# Patient Record
Sex: Male | Born: 1955 | Race: White | Hispanic: No | Smoking: Former smoker
Health system: Southern US, Community
[De-identification: ages and names within clinical notes are randomized; demographics above are authoritative.]

## PROBLEM LIST (undated history)

## (undated) DIAGNOSIS — F419 Anxiety disorder, unspecified: Secondary | ICD-10-CM

## (undated) DIAGNOSIS — K449 Diaphragmatic hernia without obstruction or gangrene: Secondary | ICD-10-CM

## (undated) DIAGNOSIS — I25110 Atherosclerotic heart disease of native coronary artery with unstable angina pectoris: Secondary | ICD-10-CM

## (undated) DIAGNOSIS — I10 Essential (primary) hypertension: Secondary | ICD-10-CM

## (undated) DIAGNOSIS — S04019A Injury of optic nerve, unspecified eye, initial encounter: Secondary | ICD-10-CM

## (undated) DIAGNOSIS — E7800 Pure hypercholesterolemia, unspecified: Secondary | ICD-10-CM

## (undated) DIAGNOSIS — S0291XA Unspecified fracture of skull, initial encounter for closed fracture: Secondary | ICD-10-CM

## (undated) HISTORY — PX: TONSILLECTOMY: SUR1361

## (undated) HISTORY — DX: Essential (primary) hypertension: I10

## (undated) HISTORY — DX: Anxiety disorder, unspecified: F41.9

## (undated) HISTORY — PX: FOOT SURGERY: SHX648

## (undated) HISTORY — PX: OTHER SURGICAL HISTORY: SHX169

---

## 1996-09-24 HISTORY — PX: SKIN GRAFT: SHX250

## 2002-12-11 ENCOUNTER — Emergency Department (HOSPITAL_COMMUNITY): Admission: EM | Admit: 2002-12-11 | Discharge: 2002-12-11 | Payer: Self-pay | Admitting: Emergency Medicine

## 2006-09-26 ENCOUNTER — Ambulatory Visit (HOSPITAL_COMMUNITY): Payer: Self-pay | Admitting: Psychiatry

## 2006-10-24 ENCOUNTER — Ambulatory Visit (HOSPITAL_COMMUNITY): Payer: Self-pay | Admitting: Psychiatry

## 2006-11-28 ENCOUNTER — Ambulatory Visit (HOSPITAL_COMMUNITY): Payer: Self-pay | Admitting: Psychiatry

## 2009-06-22 ENCOUNTER — Ambulatory Visit (HOSPITAL_COMMUNITY): Admission: RE | Admit: 2009-06-22 | Discharge: 2009-06-22 | Payer: Self-pay | Admitting: Pulmonary Disease

## 2010-02-15 ENCOUNTER — Encounter: Payer: Self-pay | Admitting: Adult Health

## 2010-02-15 ENCOUNTER — Encounter (INDEPENDENT_AMBULATORY_CARE_PROVIDER_SITE_OTHER): Payer: Self-pay | Admitting: *Deleted

## 2010-02-15 ENCOUNTER — Ambulatory Visit: Payer: Self-pay | Admitting: Cardiology

## 2010-02-15 DIAGNOSIS — F411 Generalized anxiety disorder: Secondary | ICD-10-CM | POA: Insufficient documentation

## 2010-02-15 DIAGNOSIS — R079 Chest pain, unspecified: Secondary | ICD-10-CM | POA: Insufficient documentation

## 2010-02-15 DIAGNOSIS — I10 Essential (primary) hypertension: Secondary | ICD-10-CM | POA: Insufficient documentation

## 2010-02-28 ENCOUNTER — Encounter: Payer: Self-pay | Admitting: Adult Health

## 2010-02-28 ENCOUNTER — Ambulatory Visit: Payer: Self-pay | Admitting: Cardiology

## 2010-02-28 ENCOUNTER — Encounter (HOSPITAL_COMMUNITY): Admission: RE | Admit: 2010-02-28 | Discharge: 2010-03-30 | Payer: Self-pay | Admitting: Cardiology

## 2010-03-01 ENCOUNTER — Telehealth (INDEPENDENT_AMBULATORY_CARE_PROVIDER_SITE_OTHER): Payer: Self-pay | Admitting: *Deleted

## 2010-03-01 ENCOUNTER — Encounter: Payer: Self-pay | Admitting: Cardiology

## 2010-04-14 ENCOUNTER — Telehealth (INDEPENDENT_AMBULATORY_CARE_PROVIDER_SITE_OTHER): Payer: Self-pay | Admitting: *Deleted

## 2010-10-24 NOTE — Progress Notes (Signed)
Summary: PT WANTS TO TALK TO MANAGER ABOUT BILL  Phone Note Call from Patient Call back at Home Phone [phone]   Caller: pt Reason for Call: Talk to Doctor Summary of Call: Pt called in 04/13/10 very upset because he is being charged for a stress test and has to pay out of [place] over $500. Patient was first scheduled for a regular GXT and sent home with instructions for the procedure that says do not take your medication. Patient states that Joni Reining told him to go ahead and take his BP meds for that morning so he did.   When put on the treadmill Dr Diona Browner was supervising and could not get his BP up so Dr Diona Browner asked the patient if he had taken his medications he replyed yes because Joni Reining told him to. Dr Diona Browner as a result of this ordered a stress myoview that was set up and done on another day by Dr Dietrich Pates. Per patient Dr Diona Browner did discuse this with patient and he was willing to do this.   Now that patient has recieved the bill he feels that he should not be responsible for this payment since he was told by Samara Deist to take his BP meds. He feels that he would have never had to do a myoview if he had not taking this medication. He would like the doctors to call over to the billing department and have this taken care of. He states that he is willing to pay what he would have owed for the regular GXT (that he was not charged for).  I told him I would give this information to the manager. He replyed that would do no good he had already talk to him and was told there was nothing we could do to call the billing department. I interuped patient and asked him to please let me speak. I explained to patient that there is nothing that any of our staff members her in the office can do to handle the situation and it was doing no good to call up her upset and taking it out on [place]. I convinced him into letting me talk with the manager again to see what I could do.   Initial call  taken by: Faythe Ghee,  April 14, 2010 2:52 PM  Follow-up for Phone Call        Talked with Abundio about [place] instructions. Office Note indicates that Willam should not take BB before test. RN gave Leonette Most written instructions on not to take BB the day of test.  Charels says that Samara Deist instructed him to take the BB but not his other non-blood pressure.   Ranald is talking with [facility] billing to reduce the amount he owes due to a mistake on our provider's part. Follow-up by: Alexis Goodell

## 2010-10-24 NOTE — Assessment & Plan Note (Signed)
Summary: rov post stress echo   Visit Type:  Follow-up Referring Provider:  Diona Browner Primary Provider:  Juanetta Gosling   History of Present Illness: no cardiology complaints had myoveiw this morning follow-up from procedure  Current Medications (verified): 1)  Klor-Con M20 20 Meq Cr-Tabs (Potassium Chloride Crys Cr) .[name].. 1 Tablet By Mouth Once Daily 2)  Bupropion Hcl 300 Mg Xr24h-Tab (Bupropion Hcl) .[name].. 1 Tablet By Mouth Once Daily 3)  Atenolol-Chlorthalidone 100-25 Mg Tabs (Atenolol-Chlorthalidone) .[name].. 1 Tablet By Mouth Once Daily  Allergies (verified): No Known Drug Allergies  Vital Signs:  Patient profile:   55 year old male Weight:      261 pounds Pulse rate:   64 / minute BP sitting:   136 / 82  (right arm)  Vitals Entered By: Dreama Saa, CNA (February 28, 2010 3:22 PM)

## 2010-10-24 NOTE — Progress Notes (Signed)
Summary: Results  Phone Note Call from Patient   Caller: Patient Summary of Call: pt would like return phone call regarding test results/tg Initial call taken by: Raechel Ache [facility],  March 01, 2010 10:11 AM  Follow-up for Phone Call        I gave pt prelimary results that Dr. Dietrich Pates discussed in clinic yesterday.  Cx appt for Thurs and will discuss further with pt on Tuesday. Follow-up by: Teressa Lower RN,  March 01, 2010 11:42 AM

## 2010-10-24 NOTE — Assessment & Plan Note (Signed)
Summary: PER DR.HAWKINS FOR CHEST DISCOMFORT/TG   Visit Type:  New Referral Referring Provider:  Diona Browner Primary Provider:  Juanetta Gosling  CC:  chest pain.  History of Present Illness: [name] is a 56 y/o obese CM of referred by Dr. Juanetta Gosling secondary to chest discomfort.  He has not prior history of CAD or cardiac work-up with the exception of a stress test in 1998, which the patient states was normal.He has a history of hypertension, anxiety and depression.  He has been having chest discomfort for 25 yrs which is radiation pain from his back and shoulders.  Usually occurs with exertion, but often with anxiety, and anger.  He states it is located at the base of his throat.  It is not associated with SOB, Nausea or weakness.  He has a history of hypertension, anxiety and depression.  Preventive Screening-Counseling & Management  Alcohol-Tobacco     Alcohol drinks/day: 0     Smoking Status: never     Year Quit: 1990  Current Medications (verified): 1)  Ceftin 500 Mg Tabs (Cefuroxime Axetil) .... Take 1 Tablet By Mouth Two Times A Day 2)  Klor-Con M20 20 Meq Cr-Tabs (Potassium Chloride Crys Cr) .[name].. 1 Tablet By Mouth Once Daily 3)  Bupropion Hcl 300 Mg Xr24h-Tab (Bupropion Hcl) .[name].. 1 Tablet By Mouth Once Daily 4)  Atenolol-Chlorthalidone 100-25 Mg Tabs (Atenolol-Chlorthalidone) .[name].. 1 Tablet By Mouth Once Daily  Allergies (verified): No Known Drug Allergies  Past History:  Past Medical History: Hypertension Anxiety  Family History: Father: Family History of Hyperlipidemia, Hypertension   Social History: Full Time Single  Tobacco Use - No.  Alcohol Use - no Alcohol drinks/day:  0 Smoking Status:  never  Vital Signs:  Patient profile:   55 year old male Height:      72 inches Weight:      258 pounds BMI:     35.12 O2 Sat:      98 % Pulse rate:   51 / minute BP sitting:   138 / 80  (left arm)  Physical Exam  General:  Well developed, well nourished, in no acute  distress. Head:  normocephalic and atraumatic Eyes:  PERRLA/EOM intact; conjunctiva and lids normal. Ears:  TM's intact and clear with normal canals and hearing Nose:  no deformity, discharge, inflammation, or lesions Mouth:  Teeth, gums and palate normal. Oral mucosa normal. Neck:  Neck supple, no JVD. No masses, thyromegaly or abnormal cervical nodes. Lungs:  Clear bilaterally to auscultation and percussion. Heart:  Non-displaced PMI, chest non-tender; regular rate and rhythm, S1, S2 without murmurs, rubs or gallops. Carotid upstroke normal, no bruit. Normal abdominal aortic size, no bruits. Femorals normal pulses, no bruits. Pedals normal pulses. No edema, no varicosities. Abdomen:  Bowel sounds positive; abdomen soft and non-tender without masses, organomegaly, or hernias noted. No hepatosplenomegaly. Msk:  Back normal, normal gait. Muscle strength and tone normal. Pulses:  pulses normal in all 4 extremities Extremities:  No clubbing or cyanosis. Skin:  Multiple areas of skin grafts on legs and chest. Psych:  anxious and hyperactive.     EKG  Procedure date:  02/15/2010  Findings:      Normal sinus rhythm with rate of:76bpm.  PAC's noted.    Impression & Recommendations:  Problem # 1:  CHEST PAIN UNSPECIFIED (ICD-786.50) This appears to be musculoskeletal in etiology but he has exertional symptoms as well [id number] stairs, walking up hill.  The pain starts in his lower back and radiates into his  shoulders and around to the front of his chest/neck area.  EKG is normal with occasional PAC's.  Plan stress echo for diagnostic/prognostic evaluation.  Hold BB on day of test. His updated medication list for this problem includes:    Atenolol-chlorthalidone 100-25 Mg Tabs (Atenolol-chlorthalidone) .[name]... 1 tablet by mouth once daily  Orders: Stress Echo (Stress Echo)  Problem # 2:  HYPERTENSION (ICD-401.9) Currenly controlled at this time. His updated medication list for this problem  includes:    Atenolol-chlorthalidone 100-25 Mg Tabs (Atenolol-chlorthalidone) .[name]... 1 tablet by mouth once daily  Problem # 3:  ANXIETY STATE, UNSPECIFIED (ICD-300.00) He is quiet anxious and very easily agitated.  He may need to have further evaluation for this at primary's discretion.  Reassurance given to patient about the EKG and assessment.  Explaination of stress echo was completed as well.  After mulitple questions, patient has agreed to proceed.  Patient Instructions: 1)  Your physician recommends that you schedule a follow-up appointment in: post test 2)  Your physician has requested that you have a stress echocardiogram. For further information please visit [email or web address]  Please follow instruction sheet as given.  Appended Document: PER DR.HAWKINS FOR CHEST DISCOMFORT/TG Patient referred by Dr. Juanetta Gosling with history of chest pain.  Assessed by Ms Lyman Bishop.  Patient somewhat anxious, unusual affect - reports 25 year history of chest pain, perhaps more frequent of late.  Describes "tightness" with some exertion.  Baseline HTN, no tobacco abuse.  No stress testing for greater than 10 years.  Discussed repeat testing and he agrees - an exercise echocardiogram will be obtained.  Appended Document: PER DR.HAWKINS FOR CHEST DISCOMFORT/TG Patient presented for stress echocardiogram on 5/31.  He states he was told to take his Atenolol and did so this AM.  I had him attempt to ambulate on the treadmill on Standard Bruce protocol and he walked for 6 minutes with only about 60% MPHR achieved and limited also by leg fatigue.  Procedure was cancelled (no charge) and he is to be rescheduled for a exercise Myoview (off Atenolol) with potential change to Lexiscan if needed.  This is to be done before his follow-up visit.

## 2010-10-24 NOTE — Letter (Signed)
Summary: Stress Echocardiogram Information Sheet  [facility] at [facility]  [address], [place] [address]   Phone: [phone]  Fax: [phone]      Feb 15, 2010 MRN: [id number] light prior to the test.   Erenest Rasher  Doctor: Appointment Date: Appointment Time: Appointment Location: [facility]  Stress Echocardiogram Information Sheet    Instructions:   1. DO NOT  take your AM  medicine the morning before the test.  2. Nothing to eat or drink after midnight.  3. Dress prepared to exercise.  4. DO NOT use ANY caffine or tobacco products 3 hours before appointment.  5. Report to the Short Stay Center on the1st floor.  6. Please bring all current prescription medications.  7. If you have any questions, please call [phone]

## 2010-10-24 NOTE — Letter (Signed)
Summary: [place] [facility] at [facility]  [address], [place] [address]   Phone: [phone]  Fax: [phone]      March 01, 2010 MRN: [id number]   DONEVIN SAINSBURY [address] [place], [place]  [address]   Dear Mr. HASKEW,  Your test ordered by Selena Batten has been reviewed by your physician (or physician assistant) and was found to be normal or stable. Your physician (or physician assistant) felt no changes were needed at this time.  ____ Echocardiogram  __X__ Cardiac Stress Test  ____ Lab Work  ____ Peripheral vascular study of arms, legs or neck  ____ CT scan or X-ray  ____ Lung or Breathing test  ____ Other: Please continue on current medical treatment.   Thank you.   Nona Dell, MD, F.A.C.C

## 2011-04-03 ENCOUNTER — Encounter: Payer: Self-pay | Admitting: Adult Health

## 2012-04-02 ENCOUNTER — Ambulatory Visit (HOSPITAL_COMMUNITY)
Admission: RE | Admit: 2012-04-02 | Discharge: 2012-04-02 | Disposition: A | Discharge status: Home / Self Care | Payer: BC Managed Care – PPO | Source: Ambulatory Visit | Attending: Pulmonary Disease | Admitting: Pulmonary Disease

## 2012-04-02 DIAGNOSIS — I10 Essential (primary) hypertension: Secondary | ICD-10-CM | POA: Insufficient documentation

## 2012-04-02 DIAGNOSIS — R072 Precordial pain: Secondary | ICD-10-CM | POA: Insufficient documentation

## 2012-04-02 DIAGNOSIS — I517 Cardiomegaly: Secondary | ICD-10-CM

## 2012-04-02 NOTE — Progress Notes (Signed)
*  PRELIMINARY RESULTS* Echocardiogram 2D Echocardiogram has been performed.  Stephen Graves 04/02/2012, 9:54 AM

## 2012-04-07 ENCOUNTER — Telehealth: Payer: Self-pay | Admitting: Cardiology

## 2012-04-07 NOTE — Telephone Encounter (Signed)
Patient would like test results from Echocardiogram.  Please call patient at home, he expected a call back on Friday.

## 2012-04-07 NOTE — Telephone Encounter (Signed)
Echo was ordered by Dr Juanetta Gosling.  Will forward results to him.  Advised patient that results were not concerning, however to call Dr Juanetta Gosling, as pt is questioning as to wether he needs to follow up with cardiology.  Verbalizes understanding.

## 2012-04-17 ENCOUNTER — Ambulatory Visit: Payer: Self-pay | Admitting: Cardiology

## 2012-07-07 ENCOUNTER — Telehealth: Payer: Self-pay | Admitting: Cardiology

## 2012-07-07 NOTE — Telephone Encounter (Signed)
Patient had multiple questions about Echo done in July.  Discussed results and answered questions.

## 2012-07-07 NOTE — Telephone Encounter (Signed)
Pt wants to talk with nurse about his echo. He can not take calls till after 3:30 this afternoon.

## 2012-09-06 ENCOUNTER — Emergency Department (HOSPITAL_COMMUNITY): Payer: BC Managed Care – PPO

## 2012-09-06 ENCOUNTER — Encounter (HOSPITAL_COMMUNITY): Payer: Self-pay | Admitting: Emergency Medicine

## 2012-09-06 ENCOUNTER — Emergency Department (HOSPITAL_COMMUNITY)
Admission: EM | Admit: 2012-09-06 | Discharge: 2012-09-06 | Disposition: A | Discharge status: Home / Self Care | Payer: BC Managed Care – PPO | Attending: Emergency Medicine | Admitting: Emergency Medicine

## 2012-09-06 DIAGNOSIS — S61209A Unspecified open wound of unspecified finger without damage to nail, initial encounter: Secondary | ICD-10-CM | POA: Insufficient documentation

## 2012-09-06 DIAGNOSIS — W268XXA Contact with other sharp object(s), not elsewhere classified, initial encounter: Secondary | ICD-10-CM | POA: Insufficient documentation

## 2012-09-06 DIAGNOSIS — F411 Generalized anxiety disorder: Secondary | ICD-10-CM | POA: Insufficient documentation

## 2012-09-06 DIAGNOSIS — I10 Essential (primary) hypertension: Secondary | ICD-10-CM | POA: Insufficient documentation

## 2012-09-06 DIAGNOSIS — Y9389 Activity, other specified: Secondary | ICD-10-CM | POA: Insufficient documentation

## 2012-09-06 DIAGNOSIS — Z79899 Other long term (current) drug therapy: Secondary | ICD-10-CM | POA: Insufficient documentation

## 2012-09-06 DIAGNOSIS — Z7982 Long term (current) use of aspirin: Secondary | ICD-10-CM | POA: Insufficient documentation

## 2012-09-06 DIAGNOSIS — Z87891 Personal history of nicotine dependence: Secondary | ICD-10-CM | POA: Insufficient documentation

## 2012-09-06 DIAGNOSIS — R079 Chest pain, unspecified: Secondary | ICD-10-CM | POA: Insufficient documentation

## 2012-09-06 DIAGNOSIS — Y929 Unspecified place or not applicable: Secondary | ICD-10-CM | POA: Insufficient documentation

## 2012-09-06 MED ORDER — OXYCODONE-ACETAMINOPHEN 5-325 MG PO TABS
2.0000 | ORAL_TABLET | Freq: Once | ORAL | Status: AC
  Administered 2012-09-06: 2 via ORAL
  Filled 2012-09-06: qty 2

## 2012-09-06 MED ORDER — BUPIVACAINE HCL (PF) 0.5 % IJ SOLN
10.0000 mL | Freq: Once | INTRAMUSCULAR | Status: AC
  Administered 2012-09-06: 10 mL
  Filled 2012-09-06: qty 10

## 2012-09-06 MED ORDER — TETANUS-DIPHTHERIA TOXOIDS TD 5-2 LFU IM INJ
0.5000 mL | INJECTION | Freq: Once | INTRAMUSCULAR | Status: DC
  Filled 2012-09-06: qty 0.5

## 2012-09-06 MED ORDER — HYDROCODONE-ACETAMINOPHEN 5-325 MG PO TABS: 1.0000 | ORAL_TABLET | Freq: Four times a day (QID) | ORAL | Status: DC | PRN

## 2012-09-06 NOTE — ED Notes (Signed)
Pt reports he was going to sit down into a chair when his hand was underneath part of it and is cut the tip of his finger off. Pt has tip of 3rd digit on right hand missing. Pt in nad. Pt brought tip of finger in with him. Tip was placed on ice.

## 2012-09-06 NOTE — ED Provider Notes (Signed)
History     CSN: [id number]  Arrival date & time 09/06/12  1836   First MD Initiated Contact with Patient 09/06/12 1950      Chief Complaint  Patient presents with  . Hand Pain    (Consider location/radiation/quality/duration/timing/severity/associated sxs/prior treatment) HPI Comments: Patient went to sit down.  A chair when he put his hand to support himself.  It was underneath.  The large and when he sat he cut.  The tip of his right third finger off his last tetanus shot is unknown.  He did not take any medication.  Prior to arrival, but he did apply pressure  Patient is a 56 y.o. male presenting with hand pain. The history is provided by the patient.  Hand Pain This is a new problem. The current episode started today. The problem has been unchanged. Associated symptoms include chest pain. Pertinent negatives include no joint swelling, nausea, numbness or weakness. The symptoms are aggravated by exertion. He has tried position changes for the symptoms. The treatment provided moderate relief.    Past Medical History  Diagnosis Date  . HTN (hypertension)   . Anxiety     Past Surgical History  Procedure Date  . Skin graft 1998  . Left wrist surgery     No family history on file.  History  Substance Use Topics  . Smoking status: Former Games developer  . Smokeless tobacco: Not on file     Comment: tobacco use - no  . Alcohol Use: No      Review of Systems  Constitutional: Negative for appetite change.  HENT: Negative.   Respiratory: Negative.   Cardiovascular: Positive for chest pain.  Gastrointestinal: Negative for nausea.  Musculoskeletal: Negative for joint swelling.  Skin: Positive for wound.  Neurological: Negative for dizziness, weakness and numbness.    Allergies  Lipitor  Home Medications   Current Outpatient Rx  Name  Route  Sig  Dispense  Refill  . ALPRAZOLAM 0.25 MG PO TABS   Oral   Take 0.25 mg by mouth 3 (three) times daily as needed. For  anxiety         . ASPIRIN 81 MG PO CHEW   Oral   Chew 81 mg by mouth daily.         [name] CITALOPRAM HYDROBROMIDE 20 MG PO TABS   Oral   Take 20 mg by mouth daily.         . OMEGA-3 FATTY ACIDS 1000 MG PO CAPS   Oral   Take 1 g by mouth daily.         [name] METOPROLOL TARTRATE 50 MG PO TABS   Oral   Take 50 mg by mouth 2 (two) times daily.         [name] OLMESARTAN MEDOXOMIL-HCTZ 40-25 MG PO TABS   Oral   Take 1 tablet by mouth daily.         [name] POTASSIUM CHLORIDE CRYS ER 20 MEQ PO TBCR   Oral   Take 20 mEq by mouth daily.           [name] HYDROCODONE-ACETAMINOPHEN 5-325 MG PO TABS   Oral   Take 1 tablet by mouth every 6 (six) hours as needed for pain.   15 tablet   0     BP 178/89  Pulse 47  Temp 97.7 F (36.5 C) (Oral)  Resp 16  SpO2 100%  Physical Exam  Nursing note and vitals reviewed. Constitutional: He appears well-developed and well-nourished.  HENT:  Head: Normocephalic.  Eyes: Pupils are equal, round, and reactive to light.  Cardiovascular: Normal rate.   Pulmonary/Chest: Effort normal.  Musculoskeletal: He exhibits tenderness.       Patient avulsed the tip of the right third finger, completely off.  Nail is entirely intact.  Bone is not visible at this time  Neurological: He is alert.  Skin: Skin is warm and dry.    ED Course  LACERATION REPAIR Performed by: Arman Filter Authorized by: Arman Filter Consent: Verbal consent obtained. Consent given by: patient Patient understanding: patient states understanding of the procedure being performed Patient identity confirmed: verbally with patient Time out: Immediately prior to procedure a "time out" was called to verify the correct patient, procedure, equipment, support staff and site/side marked as required. Body area: upper extremity Location details: right long finger Laceration length: 1 cm Foreign bodies: no foreign bodies Nerve involvement: none Vascular damage: no Anesthesia: digital  block Local anesthetic: lidocaine 1% without epinephrine and bupivacaine 0.5% without epinephrine Anesthetic total: 3 ml Patient sedated: no Preparation: Patient was prepped and draped in the usual sterile fashion. Irrigation solution: saline Irrigation method: syringe Amount of cleaning: extensive Debridement: none Degree of undermining: none Skin closure: 3-0 Prolene Number of sutures: 4 Technique: simple and retention suture Approximation: loose Approximation difficulty: simple Dressing: 4x4 sterile gauze, antibiotic ointment and non-adhesive packing strip Patient tolerance: Patient tolerated the procedure well with no immediate complications. Comments: 2 sutures placed horizontally, with one vertical to make the tip more anatomically.  Please rounded, Xeroform gauze was placed over the tip due to to the tissue deficit and a pressure dressing applied   (including critical care time)  Labs Reviewed - No data to display Dg Finger Middle Right  09/06/2012  *RADIOLOGY REPORT*  Clinical Data: Laceration right middle finger  RIGHT MIDDLE FINGER 2+V  Comparison: None  Findings: Soft tissue amputation at tip of distal phalanx right middle finger. Dressing artifacts. Osseous mineralization normal. No definite fracture, dislocation or bone destruction.  IMPRESSION: No definite acute osseous abnormalities.   Original Report Authenticated By: Ulyses Southward, M.D.      1. Avulsion, finger tip       MDM  Sutures were placed in finger to make it more pleasing and anatomically correct roundness rather than squared off.  Patient is to leave the pressure dressing in place until he is seen by his primary care physician on Tuesday or Wednesday        Arman Filter, NP 09/06/12 2135

## 2012-09-06 NOTE — ED Provider Notes (Signed)
Medical screening examination/treatment/procedure(s) were conducted as a shared visit with non-physician practitioner(s) and myself.  I personally evaluated the patient during the encounter  Fingertip amputation  Emmaus B. Bernette Mayers, MD 09/06/12 2248

## 2017-06-24 DIAGNOSIS — Z955 Presence of coronary angioplasty implant and graft: Secondary | ICD-10-CM

## 2017-07-16 ENCOUNTER — Emergency Department (HOSPITAL_COMMUNITY): Payer: BC Managed Care – PPO

## 2017-07-16 ENCOUNTER — Encounter (HOSPITAL_COMMUNITY): Payer: Self-pay | Admitting: Emergency Medicine

## 2017-07-16 ENCOUNTER — Observation Stay (HOSPITAL_COMMUNITY)
Admission: EM | Admit: 2017-07-16 | Discharge: 2017-07-18 | Disposition: A | Discharge status: Home / Self Care | Payer: BC Managed Care – PPO | Attending: Family Medicine | Admitting: Family Medicine

## 2017-07-16 DIAGNOSIS — Z8249 Family history of ischemic heart disease and other diseases of the circulatory system: Secondary | ICD-10-CM | POA: Diagnosis not present

## 2017-07-16 DIAGNOSIS — I25110 Atherosclerotic heart disease of native coronary artery with unstable angina pectoris: Secondary | ICD-10-CM | POA: Diagnosis not present

## 2017-07-16 DIAGNOSIS — E785 Hyperlipidemia, unspecified: Secondary | ICD-10-CM | POA: Diagnosis not present

## 2017-07-16 DIAGNOSIS — Z7982 Long term (current) use of aspirin: Secondary | ICD-10-CM | POA: Insufficient documentation

## 2017-07-16 DIAGNOSIS — Z955 Presence of coronary angioplasty implant and graft: Secondary | ICD-10-CM

## 2017-07-16 DIAGNOSIS — I214 Non-ST elevation (NSTEMI) myocardial infarction: Secondary | ICD-10-CM | POA: Diagnosis not present

## 2017-07-16 DIAGNOSIS — R079 Chest pain, unspecified: Secondary | ICD-10-CM | POA: Diagnosis present

## 2017-07-16 DIAGNOSIS — G8929 Other chronic pain: Secondary | ICD-10-CM | POA: Insufficient documentation

## 2017-07-16 DIAGNOSIS — I10 Essential (primary) hypertension: Secondary | ICD-10-CM | POA: Diagnosis present

## 2017-07-16 DIAGNOSIS — Z6834 Body mass index (BMI) 34.0-34.9, adult: Secondary | ICD-10-CM | POA: Diagnosis not present

## 2017-07-16 DIAGNOSIS — E7800 Pure hypercholesterolemia, unspecified: Secondary | ICD-10-CM | POA: Diagnosis not present

## 2017-07-16 DIAGNOSIS — Z87891 Personal history of nicotine dependence: Secondary | ICD-10-CM | POA: Diagnosis not present

## 2017-07-16 DIAGNOSIS — E669 Obesity, unspecified: Secondary | ICD-10-CM | POA: Insufficient documentation

## 2017-07-16 DIAGNOSIS — F419 Anxiety disorder, unspecified: Secondary | ICD-10-CM | POA: Insufficient documentation

## 2017-07-16 DIAGNOSIS — K449 Diaphragmatic hernia without obstruction or gangrene: Secondary | ICD-10-CM | POA: Insufficient documentation

## 2017-07-16 DIAGNOSIS — K219 Gastro-esophageal reflux disease without esophagitis: Secondary | ICD-10-CM | POA: Insufficient documentation

## 2017-07-16 DIAGNOSIS — I249 Acute ischemic heart disease, unspecified: Secondary | ICD-10-CM | POA: Diagnosis present

## 2017-07-16 HISTORY — DX: Unspecified fracture of skull, initial encounter for closed fracture: S02.91XA

## 2017-07-16 HISTORY — DX: Pure hypercholesterolemia, unspecified: E78.00

## 2017-07-16 HISTORY — DX: Diaphragmatic hernia without obstruction or gangrene: K44.9

## 2017-07-16 HISTORY — DX: Atherosclerotic heart disease of native coronary artery with unstable angina pectoris: I25.110

## 2017-07-16 LAB — I-STAT TROPONIN, ED: TROPONIN I, POC: 0.03 ng/mL (ref 0.00–0.08)

## 2017-07-16 LAB — CBC
HEMATOCRIT: 43.4 % (ref 39.0–52.0)
HEMOGLOBIN: 14.6 g/dL (ref 13.0–17.0)
MCH: 32.2 pg (ref 26.0–34.0)
MCHC: 33.6 g/dL (ref 30.0–36.0)
MCV: 95.8 fL (ref 78.0–100.0)
Platelets: 135 10*3/uL — ABNORMAL LOW (ref 150–400)
RBC: 4.53 MIL/uL (ref 4.22–5.81)
RDW: 13.2 % (ref 11.5–15.5)
WBC: 5.9 10*3/uL (ref 4.0–10.5)

## 2017-07-16 LAB — BASIC METABOLIC PANEL
ANION GAP: 9 (ref 5–15)
BUN: 21 mg/dL — ABNORMAL HIGH (ref 6–20)
CHLORIDE: 101 mmol/L (ref 101–111)
CO2: 28 mmol/L (ref 22–32)
Calcium: 9.2 mg/dL (ref 8.9–10.3)
Creatinine, Ser: 1.17 mg/dL (ref 0.61–1.24)
GFR calc non Af Amer: >60 mL/min (ref >60)
GLUCOSE: 109 mg/dL — AB (ref 65–99)
POTASSIUM: 4.2 mmol/L (ref 3.5–5.1)
Sodium: 138 mmol/L (ref 135–145)

## 2017-07-16 LAB — TROPONIN I
Troponin I: 0.06 ng/mL (ref <0.03)
Troponin I: 0.05 ng/mL (ref <0.03)
Troponin I: 0.04 ng/mL (ref <0.03)

## 2017-07-16 MED ORDER — LOSARTAN POTASSIUM-HCTZ 100-25 MG PO TABS: 1.0000 | ORAL_TABLET | Freq: Every day | ORAL | Status: DC

## 2017-07-16 MED ORDER — ASPIRIN 81 MG PO CHEW
243.0000 mg | CHEWABLE_TABLET | Freq: Once | ORAL | Status: AC
  Administered 2017-07-16: 243 mg via ORAL
  Filled 2017-07-16: qty 3

## 2017-07-16 MED ORDER — GI COCKTAIL ~~LOC~~: 30.0000 mL | Freq: Four times a day (QID) | ORAL | Status: DC | PRN

## 2017-07-16 MED ORDER — LOSARTAN POTASSIUM 50 MG PO TABS
100.0000 mg | ORAL_TABLET | Freq: Every day | ORAL | Status: DC
  Administered 2017-07-17 – 2017-07-18 (×2): 100 mg via ORAL
  Filled 2017-07-16 (×2): qty 2

## 2017-07-16 MED ORDER — ONDANSETRON HCL 4 MG/2ML IJ SOLN: 4.0000 mg | Freq: Four times a day (QID) | INTRAMUSCULAR | Status: DC | PRN

## 2017-07-16 MED ORDER — ASPIRIN 81 MG PO CHEW
324.0000 mg | CHEWABLE_TABLET | Freq: Once | ORAL | Status: DC
Start: 2017-07-16 — End: 2017-07-16

## 2017-07-16 MED ORDER — NITROGLYCERIN 0.4 MG SL SUBL
0.4000 mg | SUBLINGUAL_TABLET | Freq: Once | SUBLINGUAL | Status: AC
  Administered 2017-07-16: 0.4 mg via SUBLINGUAL
  Filled 2017-07-16: qty 1

## 2017-07-16 MED ORDER — HYDROCHLOROTHIAZIDE 25 MG PO TABS
25.0000 mg | ORAL_TABLET | Freq: Every day | ORAL | Status: DC
  Administered 2017-07-17 – 2017-07-18 (×2): 25 mg via ORAL
  Filled 2017-07-16 (×2): qty 1

## 2017-07-16 MED ORDER — POTASSIUM CHLORIDE CRYS ER 20 MEQ PO TBCR
20.0000 meq | EXTENDED_RELEASE_TABLET | Freq: Every day | ORAL | Status: DC
  Administered 2017-07-17 – 2017-07-18 (×2): 20 meq via ORAL
  Filled 2017-07-16 (×2): qty 1

## 2017-07-16 MED ORDER — AMLODIPINE BESYLATE 10 MG PO TABS
10.0000 mg | ORAL_TABLET | Freq: Every day | ORAL | Status: DC
  Administered 2017-07-17 – 2017-07-18 (×2): 10 mg via ORAL
  Filled 2017-07-16: qty 2
  Filled 2017-07-16: qty 1

## 2017-07-16 MED ORDER — ACETAMINOPHEN 325 MG PO TABS
650.0000 mg | ORAL_TABLET | ORAL | Status: DC | PRN
  Administered 2017-07-17: 21:00:00 650 mg via ORAL
  Filled 2017-07-16: qty 2

## 2017-07-16 MED ORDER — ENOXAPARIN SODIUM 60 MG/0.6ML ~~LOC~~ SOLN: 55.0000 mg | SUBCUTANEOUS | Status: DC

## 2017-07-16 MED ORDER — CITALOPRAM HYDROBROMIDE 20 MG PO TABS
40.0000 mg | ORAL_TABLET | Freq: Every day | ORAL | Status: DC
  Administered 2017-07-17 – 2017-07-18 (×2): 40 mg via ORAL
  Filled 2017-07-16 (×2): qty 2
  Filled 2017-07-16: qty 1

## 2017-07-16 MED ORDER — HYDRALAZINE HCL 20 MG/ML IJ SOLN: 10.0000 mg | INTRAMUSCULAR | Status: DC | PRN

## 2017-07-16 MED ORDER — OMEGA-3-ACID ETHYL ESTERS 1 G PO CAPS
1.0000 g | ORAL_CAPSULE | Freq: Every day | ORAL | Status: DC
  Administered 2017-07-17 – 2017-07-18 (×2): 1 g via ORAL
  Filled 2017-07-16 (×4): qty 1

## 2017-07-16 MED ORDER — ASPIRIN 81 MG PO CHEW
81.0000 mg | CHEWABLE_TABLET | Freq: Every day | ORAL | Status: DC
  Administered 2017-07-17 – 2017-07-18 (×2): 81 mg via ORAL
  Filled 2017-07-16 (×2): qty 1

## 2017-07-16 MED ORDER — MORPHINE SULFATE (PF) 2 MG/ML IV SOLN: 2.0000 mg | INTRAVENOUS | Status: DC | PRN

## 2017-07-16 MED ORDER — METOPROLOL TARTRATE 25 MG PO TABS: 12.5000 mg | ORAL_TABLET | Freq: Two times a day (BID) | ORAL | Status: DC

## 2017-07-16 NOTE — ED Notes (Addendum)
Denies chest pain, c/o headache after taking NTG.  Pt says the discomfort to pain is not pain but soreness.

## 2017-07-16 NOTE — ED Triage Notes (Signed)
Patient states he has intermittent chest pain "for a long time" but woke this morning at 1am and again at 5am with a chest pain that felt different then "his normal chest pain." Patient states chest pain radiates into back. Patient seen at [facility] and had EKG in which he states he was told it was "normal" burt Larene Pickett wanted him to be evaluated in ER. Denies any shortness of breath, dizziness, nausea, vomiting, or weakness. Patient reports taking x1 81mg  aspirin this morning.

## 2017-07-16 NOTE — ED Provider Notes (Signed)
[facility] EMERGENCY DEPARTMENT Provider Note   CSN: [id number] Arrival date & time: 07/16/17  0920     History   Chief Complaint Chief Complaint  Patient presents with  . Chest Pain    HPI SENAN UREY is a 61 y.o. male presenting with chest pain which has been intermittent over the past several days.  He endorses chronic intermittent epigastric to midsternal pain associated with a hiatal hernia which improves with drinking a soda.  Over the past several days he has had increased episodes of transient midsternal pain, then last night woke with this pain at 1 am and again at 5 am, with radiation into his back.  Earlier there was also radiation into the left arm which has resolved.  He denies n/v , sob, acid reflux, dizziness, palpitations.  He was seen by his pcp this am and was referred here for further work up.  Cardiac risk factors include htn, hypercholesterolemia, age, and strong family hx.  He is a former smoker.  The history is provided by the patient.    Past Medical History:  Diagnosis Date  . Anxiety   . High cholesterol   . HTN (hypertension)   . Skull fracture [facility])     Patient Active Problem List   Diagnosis Date Noted  . ANXIETY STATE, UNSPECIFIED 02/15/2010  . HYPERTENSION 02/15/2010  . CHEST PAIN UNSPECIFIED 02/15/2010    Past Surgical History:  Procedure Laterality Date  . FOOT SURGERY Left   . left wrist surgery    . SKIN GRAFT  1998       Home Medications    Prior to Admission medications   Medication Sig Start Date End Date Taking? Authorizing Provider  amLODipine (NORVASC) 10 MG tablet Take 10 mg by mouth daily.   Yes [provider]  aspirin 81 MG chewable tablet Chew 81 mg by mouth daily.   Yes [provider]  citalopram (CELEXA) 40 MG tablet Take 40 mg by mouth daily.  06/17/17  Yes [provider]  fish oil-omega-3 fatty acids 1000 MG capsule Take 1 g by mouth daily.   Yes [provider]    losartan-hydrochlorothiazide (HYZAAR) 100-25 MG tablet Take 1 tablet by mouth daily.  05/20/17  Yes [provider]  meloxicam (MOBIC) 15 MG tablet Take 15 mg by mouth daily.  05/17/17  Yes [provider]  metoprolol tartrate (LOPRESSOR) 25 MG tablet Take 12.5-25 mg by mouth 2 (two) times daily. 12.5mg  in the morning and 25mg  at bedtime 06/19/17  Yes [provider]  potassium chloride SA (K-DUR,KLOR-CON) 20 MEQ tablet Take 20 mEq by mouth daily.     Yes [provider]    Family History History reviewed. No pertinent family history.  Social History Social History  Substance Use Topics  . Smoking status: Former Smoker    Types: Cigarettes  . Smokeless tobacco: Never Used     Comment: tobacco use - no  . Alcohol use No     Allergies   Lipitor [atorvastatin]   Review of Systems Review of Systems   Physical Exam Updated Vital Signs BP (!) 163/92   Pulse (!) 49   Temp 98 F (36.7 C)   Resp 14   Ht 5' 11.5" (1.816 m)   Wt 110.7 kg (244 lb)   SpO2 98%   BMI 33.56 kg/m   Physical Exam   ED Treatments / Results  Labs (all labs ordered are listed, but only abnormal results are  displayed) Labs Reviewed  BASIC METABOLIC PANEL - Abnormal; Notable for the following:       Result Value   Glucose, Bld 109 (*)    BUN 21 (*)    All other components within normal limits  CBC - Abnormal; Notable for the following:    Platelets 135 (*)    All other components within normal limits  TROPONIN I - Abnormal; Notable for the following:    Troponin I 0.06 (*)    All other components within normal limits    EKG  EKG Interpretation  Date/Time:  Tuesday July 16 2017 09:30:25 EDT Ventricular Rate:  49 PR Interval:    QRS Duration: 94 QT Interval:  417 QTC Calculation: 377 R Axis:   -12 Text Interpretation:  Sinus bradycardia Atrial premature complexes Anteroseptal infarct, age indeterminate Baseline wander When compared with ECG of  02/15/2010 Baseline wander is now Present Confirmed by Francine Graven [phone] on 07/16/2017 9:51:52 AM       Radiology Dg Chest 2 View  Result Date: 07/16/2017 CLINICAL DATA:  Chest pain. EXAM: CHEST  2 VIEW COMPARISON:  No recent prior. FINDINGS: Mediastinum and hilar structures normal. Nodular density noted over the right lung base most likely a nipple shadow. Repeat chest x-ray with nipple markers suggested. No pleural effusion or pneumothorax. Cardiomegaly with normal pulmonary vascularity. IMPRESSION: Nodular density noted over the right lung base, most likely a nipple shadow. Repeat chest x-ray with nipple markers suggested. No acute bony or joint abnormality identified. Electronically Signed   By: Marcello Moores  Register   On: 07/16/2017 10:36    Procedures Procedures (including critical care time)  Medications Ordered in ED Medications  nitroGLYCERIN (NITROSTAT) SL tablet 0.4 mg (0.4 mg Sublingual Given 07/16/17 1021)  aspirin chewable tablet 243 mg (243 mg Oral Given 07/16/17 1021)     Initial Impression / Assessment and Plan / ED Course  I have reviewed the triage vital signs and the nursing notes.  Pertinent labs & imaging results that were available during my care of the patient were reviewed by me and considered in my medical decision making (see chart for details).     Pt with chest pain, elevated troponin and risk factors for acs.  Pt will be admitted for further cp work up. Discussed with Dr Sheran Fava who will admit pt.  Final Clinical Impressions(s) / ED Diagnoses   Final diagnoses:  Chest pain, unspecified type    New Prescriptions New Prescriptions   No medications on file     Landis Martins 07/16/17 [place], [place], DO 07/19/17 [phone]

## 2017-07-16 NOTE — ED Notes (Signed)
Hospitalist at bedside 

## 2017-07-16 NOTE — ED Notes (Signed)
CRITICAL VALUE ALERT  Critical Value:  Troponin 0.06  Date & Time Notied:  07/16/17 1301  Provider Notified: Evalee Jefferson PA  Orders Received/Actions taken: none

## 2017-07-16 NOTE — ED Notes (Signed)
Pt denies chest pain at this time but given NTG sl as ordered.  VSS.

## 2017-07-16 NOTE — H&P (Signed)
History and Physical    Stephen Graves HER:[id number] DOB: Jul 13, 1956 DOA: 07/16/2017  Referring MD/NP/PA: Evalee Jefferson PCP: [place]   Patient coming from: home  Chief Complaint: chest pain  HPI: Stephen Graves is a 61 y.o. male with history of hypertension, hyperlipidemia, obesity, former smoker, anxiety, hiatal hernia who presents with chest pain.  At baseline, he states he has intermittent lower chest pains that occur at rest or with exertion a few times per week which get better with a sip of water.  These have been going on for 30 years and he attributes them to his hiatal hernia.  This morning at 1 AM, he was awoken by chest pressure that was severe and located at the upper chest and radiated to the left shoulder and back.  The pain was not associated with any shortness of breath, nausea, lightheadedness or diaphoresis.  He tried drinking some water which did not help.  The pain gradually went away.  His pain recurred again this morning and so he came to the emergency department for evaluation.  He has otherwise been feeling well, no fevers, chills, shortness of breath or cough, vomiting or diarrhea.  He has had a lot of sinus congestion recently  ED Course: Vital signs.  Afebrile, blood pressure is elevated to 171/96, pulse in the 40s and 50s, stable on room air..  Labs: Initial troponin of 0.06.  EKG demonstrated sinus bradycardia, no T wave inversions or ST segment changes.  CXR was normal except for a possible nodular density in the right base.  CXR is being repeated with nipple markers.  He received aspirin in the ER and they gave him nitroglycerin, but his chest pain had already resolved by the time he received it.    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  PULM:  Denies SOB, cough.   GI:  Denies nausea, vomiting, constipation, diarrhea.   GU:  Denies dysuria, frequency, urgency ENDO:  Denies polyuria, polydipsia.   HEME:  Denies blood in stools, abnormal bruising or bleeding.  LYMPH:  Denies lymphadenopathy.   MSK:  Denies arthralgias, myalgias.   DERM:  Denies skin rash or ulcer.   NEURO:  Denies new focal numbness or weakness, slurred speech, confusion PSYCH:  Chronic anxiety and depression.    Past Medical History:  Diagnosis Date  . Anxiety   . Hiatal hernia   . High cholesterol   . HTN (hypertension)   . Skull fracture [facility])     Past Surgical History:  Procedure Laterality Date  . FOOT SURGERY Left   . left wrist surgery    . SKIN GRAFT  1998     reports that he quit smoking about 28 years ago. His smoking use included Cigarettes. He has never used smokeless tobacco. He reports that he does not drink alcohol or use drugs.  Allergies  Allergen Reactions  . Lipitor [Atorvastatin]     Pain in neck and legs after taking medication    Family History  Problem Relation Age of Onset  . Stroke Father 48  . Heart attack Father 38  . CAD Mother   . Prostate cancer Brother   . Thyroid nodules Brother     Prior to Admission medications   Medication Sig Start Date End Date Taking?  Authorizing Provider  amLODipine (NORVASC) 10 MG tablet Take 10 mg by mouth daily.   Yes [provider]  aspirin 81 MG chewable tablet Chew 81 mg by mouth daily.   Yes [provider]  citalopram (CELEXA) 40 MG tablet Take 40 mg by mouth daily.  06/17/17  Yes [provider]  fish oil-omega-3 fatty acids 1000 MG capsule Take 1 g by mouth daily.   Yes [provider]  losartan-hydrochlorothiazide (HYZAAR) 100-25 MG tablet Take 1 tablet by mouth daily.  05/20/17  Yes [provider]  meloxicam (MOBIC) 15 MG tablet Take 15 mg by mouth daily.  05/17/17  Yes [provider]  metoprolol tartrate  (LOPRESSOR) 25 MG tablet Take 12.5-25 mg by mouth 2 (two) times daily. 12.5mg  in the morning and 25mg  at bedtime 06/19/17  Yes [provider]  potassium chloride SA (K-DUR,KLOR-CON) 20 MEQ tablet Take 20 mEq by mouth daily.     Yes [provider]    Physical Exam: Vitals:   07/16/17 1115 07/16/17 1130 07/16/17 1200 07/16/17 1230  BP:  (!) 138/92 (!) 139/92 (!) 163/92  Pulse:  (!) 46 (!) 46 (!) 49  Resp:  12 11 14   Temp:      SpO2: 99% 98% 99% 98%  Weight:      Height:          Constitutional:  Adult male, NAD, calm, comfortable Eyes: PERRL, lids and conjunctivae normal ENMT:  Moist mucous membranes.  Oropharynx nonerythematous, no exudates.   Neck:  No nuchal rigidity, no masses Respiratory:  No wheezes, rales, or rhonchi Cardiovascular: Regular rate and rhythm, no murmurs / rubs / gallops.  2+ radial pulses. Abdomen:  Normal active bowel sounds, soft, nondistended, nontender Musculoskeletal: Normal muscle tone and bulk.  No contractures.  Skin:  no rashes, abrasions, or ulcers Neurologic:  CN 2-12 grossly intact. Sensation intact to light touch, strength 5/5 throughout Psychiatric:  Alert and oriented x 3. Normal affect.   Labs on Admission: I have personally reviewed following labs and imaging studies  CBC:  Recent Labs Lab 07/16/17 1018  WBC 5.9  HGB 14.6  HCT 43.4  MCV 95.8  PLT 144*   Basic Metabolic Panel:  Recent Labs Lab 07/16/17 1018  NA 138  K 4.2  CL 101  CO2 28  GLUCOSE 109*  BUN 21*  CREATININE 1.17  CALCIUM 9.2   GFR: Estimated Creatinine Clearance: 82.4 mL/min (by C-G formula based on SCr of 1.17 mg/dL). Liver Function Tests: No results for input(s): AST, ALT, ALKPHOS, BILITOT, PROT, ALBUMIN in the last 168 hours. No results for input(s): LIPASE, AMYLASE in the last 168 hours. No results for input(s): AMMONIA in the last 168 hours. Coagulation Profile: No results for input(s): INR, PROTIME in the last 168  hours. Cardiac Enzymes:  Recent Labs Lab 07/16/17 1018  TROPONINI 0.06*   BNP (last 3 results) No results for input(s): PROBNP in the last 8760 hours. HbA1C: No results for input(s): HGBA1C in the last 72 hours. CBG: No results for input(s): GLUCAP in the last 168 hours. Lipid Profile: No results for input(s): CHOL, HDL, LDLCALC, TRIG, CHOLHDL, LDLDIRECT in the last 72 hours. Thyroid Function Tests: No results for input(s): TSH, T4TOTAL, FREET4, T3FREE, THYROIDAB in the last 72 hours. Anemia Panel: No results for input(s): VITAMINB12, FOLATE, FERRITIN, TIBC, IRON, RETICCTPCT in the last 72 hours. Urine analysis: No results found for: COLORURINE, APPEARANCEUR, [place], [place], [place], [place], [place], [place], [place], [place], NITRITE, LEUKOCYTESUR  Sepsis Labs: @LABRCNTIP (procalcitonin:4,lacticidven:4) )No results found for this or any previous visit (from the past 240 hour(s)).   Radiological Exams on Admission: Dg Chest 2 View  Result Date: 07/16/2017 CLINICAL DATA:  Chest pain. EXAM: CHEST  2 VIEW COMPARISON:  No recent prior. FINDINGS: Mediastinum and hilar structures normal. Nodular density noted over the right lung base most likely a nipple shadow. Repeat chest x-ray with nipple markers suggested. No pleural effusion or pneumothorax. Cardiomegaly with normal pulmonary vascularity. IMPRESSION: Nodular density noted over the right lung base, most likely a nipple shadow. Repeat chest x-ray with nipple markers suggested. No acute bony or joint abnormality identified. Electronically Signed   By: Marcello Moores  Register   On: 07/16/2017 10:36    EKG: Independently reviewed. Sinus rhythm, no STEMI  Assessment/Plan Active Problems:   Chest pain   Chest pain, HEART Score = 5.  Differential includes ACS, GERD, MSK pain.  Initial troponin was minimally elevated, but second was negative.  First may have been falsely elevated. -  Telemetry -  Cycle troponins and ECG -  A1c  & lipid panel -  Daily aspirin -  NTG prn chest pain -  ECHO -  Maalox prn -  Cardiology consult for consideration of stress test -  NPO after midnight  Hypertension, blood pressures are elevated -  Continue norvasc, losartan-HCTZ -  Add prn hydralazine -  Hold metoprolol for possible stress test tomorrow  Hyperlipidemia -  FLP -  Continue fish oil -  Patient amenable to trying a different statin  Anxiety -  Continue celexa  DVT prophylaxis: lovenox  Code Status: full Family Communication:  Patient alone Disposition Plan: likely home tomorrow  Consults called: Cardiology  Admission status: observation, telemetry  Janece Canterbury MD [facility] Pager [phone]  If 7PM-7AM, please contact night-coverage [email or web address] Password [email or web address]  07/16/2017, 3:03 PM

## 2017-07-17 ENCOUNTER — Encounter (HOSPITAL_COMMUNITY)
Admission: EM | Disposition: A | Discharge status: Home / Self Care | Payer: Self-pay | Source: Home / Self Care | Attending: Emergency Medicine

## 2017-07-17 ENCOUNTER — Observation Stay (HOSPITAL_BASED_OUTPATIENT_CLINIC_OR_DEPARTMENT_OTHER): Payer: BC Managed Care – PPO

## 2017-07-17 DIAGNOSIS — R9431 Abnormal electrocardiogram [ECG] [EKG]: Secondary | ICD-10-CM | POA: Diagnosis not present

## 2017-07-17 DIAGNOSIS — I249 Acute ischemic heart disease, unspecified: Secondary | ICD-10-CM | POA: Diagnosis present

## 2017-07-17 DIAGNOSIS — I2510 Atherosclerotic heart disease of native coronary artery without angina pectoris: Secondary | ICD-10-CM | POA: Diagnosis not present

## 2017-07-17 DIAGNOSIS — Z87891 Personal history of nicotine dependence: Secondary | ICD-10-CM

## 2017-07-17 DIAGNOSIS — R072 Precordial pain: Secondary | ICD-10-CM

## 2017-07-17 DIAGNOSIS — I214 Non-ST elevation (NSTEMI) myocardial infarction: Secondary | ICD-10-CM

## 2017-07-17 DIAGNOSIS — I10 Essential (primary) hypertension: Secondary | ICD-10-CM | POA: Diagnosis not present

## 2017-07-17 DIAGNOSIS — E7800 Pure hypercholesterolemia, unspecified: Secondary | ICD-10-CM | POA: Diagnosis not present

## 2017-07-17 DIAGNOSIS — R079 Chest pain, unspecified: Secondary | ICD-10-CM | POA: Diagnosis not present

## 2017-07-17 DIAGNOSIS — R748 Abnormal levels of other serum enzymes: Secondary | ICD-10-CM | POA: Diagnosis not present

## 2017-07-17 DIAGNOSIS — E785 Hyperlipidemia, unspecified: Secondary | ICD-10-CM | POA: Diagnosis not present

## 2017-07-17 DIAGNOSIS — I25110 Atherosclerotic heart disease of native coronary artery with unstable angina pectoris: Secondary | ICD-10-CM | POA: Diagnosis not present

## 2017-07-17 HISTORY — PX: LEFT HEART CATH AND CORONARY ANGIOGRAPHY: CATH118249

## 2017-07-17 HISTORY — PX: CORONARY STENT INTERVENTION: CATH118234

## 2017-07-17 LAB — POCT ACTIVATED CLOTTING TIME: ACTIVATED CLOTTING TIME: 279 s

## 2017-07-17 LAB — HIV ANTIBODY (ROUTINE TESTING W REFLEX): HIV Screen 4th Generation wRfx: NONREACTIVE

## 2017-07-17 LAB — ECHOCARDIOGRAM COMPLETE
HEIGHTINCHES: 71 in
Weight: 3904 oz

## 2017-07-17 LAB — PROTIME-INR
INR: 1.08
PROTHROMBIN TIME: 13.9 s (ref 11.4–15.2)

## 2017-07-17 SURGERY — LEFT HEART CATH AND CORONARY ANGIOGRAPHY
Anesthesia: LOCAL

## 2017-07-17 MED ORDER — VERAPAMIL HCL 2.5 MG/ML IV SOLN
INTRAVENOUS | Status: DC | PRN
  Administered 2017-07-17: 10 mL via INTRA_ARTERIAL

## 2017-07-17 MED ORDER — SODIUM CHLORIDE 0.9 % IV SOLN: 250.0000 mL | INTRAVENOUS | Status: DC | PRN

## 2017-07-17 MED ORDER — HEART ATTACK BOUNCING BOOK
Freq: Once | Status: AC
  Administered 2017-07-17: 21:00:00
  Filled 2017-07-17: qty 1

## 2017-07-17 MED ORDER — NITROGLYCERIN IN D5W 200-5 MCG/ML-% IV SOLN: 0.0000 ug/min | INTRAVENOUS | Status: DC

## 2017-07-17 MED ORDER — METOPROLOL TARTRATE 25 MG PO TABS
25.0000 mg | ORAL_TABLET | Freq: Two times a day (BID) | ORAL | Status: DC
  Administered 2017-07-17 – 2017-07-18 (×2): 25 mg via ORAL
  Filled 2017-07-17 (×2): qty 1

## 2017-07-17 MED ORDER — LIDOCAINE HCL 2 % IJ SOLN
INTRAMUSCULAR | Status: AC
  Filled 2017-07-17: qty 20

## 2017-07-17 MED ORDER — FENTANYL CITRATE (PF) 100 MCG/2ML IJ SOLN
INTRAMUSCULAR | Status: AC
  Filled 2017-07-17: qty 2

## 2017-07-17 MED ORDER — SODIUM CHLORIDE 0.9% FLUSH
3.0000 mL | Freq: Two times a day (BID) | INTRAVENOUS | Status: DC
  Administered 2017-07-18: 3 mL via INTRAVENOUS

## 2017-07-17 MED ORDER — HEPARIN SODIUM (PORCINE) 1000 UNIT/ML IJ SOLN
INTRAMUSCULAR | Status: DC | PRN
  Administered 2017-07-17: 5000 [IU] via INTRAVENOUS

## 2017-07-17 MED ORDER — VERAPAMIL HCL 2.5 MG/ML IV SOLN
INTRAVENOUS | Status: AC
  Filled 2017-07-17: qty 2

## 2017-07-17 MED ORDER — ROSUVASTATIN CALCIUM 10 MG PO TABS
10.0000 mg | ORAL_TABLET | Freq: Every day | ORAL | Status: DC
  Administered 2017-07-17: 10 mg via ORAL
  Filled 2017-07-17: qty 1

## 2017-07-17 MED ORDER — NITROGLYCERIN 1 MG/10 ML FOR IR/CATH LAB
INTRA_ARTERIAL | Status: DC | PRN
  Administered 2017-07-17 (×2): 200 ug via INTRACORONARY

## 2017-07-17 MED ORDER — MIDAZOLAM HCL 2 MG/2ML IJ SOLN
INTRAMUSCULAR | Status: AC
  Filled 2017-07-17: qty 2

## 2017-07-17 MED ORDER — MORPHINE SULFATE (PF) 4 MG/ML IV SOLN: 2.0000 mg | INTRAVENOUS | Status: DC | PRN

## 2017-07-17 MED ORDER — MIDAZOLAM HCL 2 MG/2ML IJ SOLN
INTRAMUSCULAR | Status: DC | PRN
  Administered 2017-07-17 (×2): 1 mg via INTRAVENOUS

## 2017-07-17 MED ORDER — TICAGRELOR 90 MG PO TABS
ORAL_TABLET | ORAL | Status: DC | PRN
  Administered 2017-07-17: 180 mg via ORAL

## 2017-07-17 MED ORDER — TICAGRELOR 90 MG PO TABS
90.0000 mg | ORAL_TABLET | Freq: Two times a day (BID) | ORAL | Status: DC
  Administered 2017-07-18 (×2): 90 mg via ORAL
  Filled 2017-07-17 (×2): qty 1

## 2017-07-17 MED ORDER — SODIUM CHLORIDE 0.9% FLUSH: 3.0000 mL | INTRAVENOUS | Status: DC | PRN

## 2017-07-17 MED ORDER — PANTOPRAZOLE SODIUM 40 MG PO TBEC
40.0000 mg | DELAYED_RELEASE_TABLET | Freq: Every day | ORAL | Status: DC
  Administered 2017-07-17 – 2017-07-18 (×2): 40 mg via ORAL
  Filled 2017-07-17 (×2): qty 1

## 2017-07-17 MED ORDER — NITROGLYCERIN IN D5W 200-5 MCG/ML-% IV SOLN
INTRAVENOUS | Status: AC | PRN
  Administered 2017-07-17: 10 ug/min via INTRAVENOUS

## 2017-07-17 MED ORDER — ANGIOPLASTY BOOK
Freq: Once | Status: AC
  Administered 2017-07-17: 21:00:00
  Filled 2017-07-17: qty 1

## 2017-07-17 MED ORDER — FENTANYL CITRATE (PF) 100 MCG/2ML IJ SOLN
INTRAMUSCULAR | Status: DC | PRN
  Administered 2017-07-17: 25 ug via INTRAVENOUS
  Administered 2017-07-17: 50 ug via INTRAVENOUS

## 2017-07-17 MED ORDER — NITROGLYCERIN 1 MG/10 ML FOR IR/CATH LAB
INTRA_ARTERIAL | Status: AC
  Filled 2017-07-17: qty 10

## 2017-07-17 MED ORDER — LIDOCAINE HCL 2 % IJ SOLN
INTRAMUSCULAR | Status: DC | PRN
  Administered 2017-07-17: 5 mL via INTRADERMAL

## 2017-07-17 MED ORDER — THE SENSUOUS HEART BOOK
Freq: Once | Status: AC
Start: 2017-07-17 — End: 2017-07-17
  Administered 2017-07-17: 21:00:00
  Filled 2017-07-17: qty 1

## 2017-07-17 MED ORDER — HEPARIN (PORCINE) IN NACL 2-0.9 UNIT/ML-% IJ SOLN
INTRAMUSCULAR | Status: AC | PRN
  Administered 2017-07-17: 1000 mL via INTRA_ARTERIAL

## 2017-07-17 MED ORDER — HEPARIN SODIUM (PORCINE) 1000 UNIT/ML IJ SOLN
INTRAMUSCULAR | Status: AC
Start: 2017-07-17 — End: ?
  Filled 2017-07-17: qty 1

## 2017-07-17 MED ORDER — NITROGLYCERIN IN D5W 200-5 MCG/ML-% IV SOLN
INTRAVENOUS | Status: AC
  Filled 2017-07-17: qty 250

## 2017-07-17 MED ORDER — IOPAMIDOL (ISOVUE-370) INJECTION 76%
INTRAVENOUS | Status: DC | PRN
  Administered 2017-07-17: 180 mL via INTRA_ARTERIAL

## 2017-07-17 MED ORDER — IOPAMIDOL (ISOVUE-370) INJECTION 76%
INTRAVENOUS | Status: AC
  Filled 2017-07-17: qty 125

## 2017-07-17 MED ORDER — SODIUM CHLORIDE 0.9 % IV SOLN: INTRAVENOUS | Status: AC

## 2017-07-17 SURGICAL SUPPLY — 17 items
BALLN SAPPHIRE 2.0X12 (BALLOONS) ×2
BALLN SAPPHIRE ~~LOC~~ 2.75X15 (BALLOONS) ×2 IMPLANT
BALLOON SAPPHIRE 2.0X12 (BALLOONS) ×1 IMPLANT
CATH OPTITORQUE JACKY 4.0 5F (CATHETERS) ×2 IMPLANT
CATH VISTA GUIDE 6FR XBLAD3.5 (CATHETERS) ×2 IMPLANT
DEVICE RAD COMP TR BAND LRG (VASCULAR PRODUCTS) ×2 IMPLANT
GLIDESHEATH SLEND SS 6F .021 (SHEATH) ×2 IMPLANT
GUIDEWIRE INQWIRE 1.5J.035X260 (WIRE) ×1 IMPLANT
INQWIRE 1.5J .035X260CM (WIRE) ×2
KIT ENCORE 26 ADVANTAGE (KITS) ×2 IMPLANT
KIT HEART LEFT (KITS) ×2 IMPLANT
PACK CARDIAC CATHETERIZATION (CUSTOM PROCEDURE TRAY) ×2 IMPLANT
STENT RESOLUTE ONYX 2.5X26 (Permanent Stent) ×2 IMPLANT
SYR MEDRAD MARK V 150ML (SYRINGE) ×2 IMPLANT
TRANSDUCER W/STOPCOCK (MISCELLANEOUS) ×2 IMPLANT
TUBING CIL FLEX 10 FLL-RA (TUBING) ×2 IMPLANT
WIRE RUNTHROUGH .014X180CM (WIRE) ×2 IMPLANT

## 2017-07-17 NOTE — Progress Notes (Signed)
Report given to [facility] for pt transfer to [facility] for procedure.      Pt left building via stretcher with [facility].  Pt stable.

## 2017-07-17 NOTE — H&P (View-Only) (Signed)
Cardiology Consultation:   Patient ID: Stephen Graves; [id number]; 1955-10-03   Admit date: 07/16/2017 Date of Consult: 07/17/2017  Primary Care Provider: [facility] Primary Cardiologist: New Dr. Bronson Ing Primary Electrophysiologist:  NA   Patient Profile:   Stephen Graves is a 61 y.o. male with a hx of hiatal hernia who is being seen today for the evaluation of chest pain at the request of Dr. Cathlean Sauer.  History of Present Illness:   Stephen Graves is a 61 year old male patient with history of hypertension, HLD, obesity, former smoker, had a hernia and anxiety. He awakened at 1 AM with chest pressure described as severe and radiating to his back unrelieved with drinking water but usually takes away his hiatal hernia pain. The pain gradually went away after 1 1/2 hrs but returned yest morning and he went to Dr. Delanna Ahmadi office trying to save money but was sent to the ER. EKG sinus bradycardia with poor R-wave progression anteriorly and nonspecific ST-T wave changes. Troponin 0.06, 0.05 and 0.04. Blood pressure was elevated on admission heart rate 40s to 50s. Patient says his HH pain starts on lower right abdomen and radiates across his chest eases with drinking H2O. This pain was very different. 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 Father died 1 MI, mother with angina. Didn't tolerate statins so untreated HLD. Quit smoking 1990.     Past Medical History:  Diagnosis Date  . Anxiety   . Hiatal hernia   . High cholesterol   . HTN (hypertension)   . Skull fracture [facility])     Past Surgical History:  Procedure Laterality Date  . FOOT SURGERY Left   . left wrist surgery    . SKIN GRAFT  1998     Home Medications:  Prior to Admission medications   Medication Sig Start Date End Date Taking? Authorizing Provider  amLODipine (NORVASC) 10 MG tablet Take 10 mg by mouth daily.   Yes [provider]  aspirin 81 MG  chewable tablet Chew 81 mg by mouth daily.   Yes [provider]  citalopram (CELEXA) 40 MG tablet Take 40 mg by mouth daily.  06/17/17  Yes [provider]  fish oil-omega-3 fatty acids 1000 MG capsule Take 1 g by mouth daily.   Yes [provider]  losartan-hydrochlorothiazide (HYZAAR) 100-25 MG tablet Take 1 tablet by mouth daily.  05/20/17  Yes [provider]  meloxicam (MOBIC) 15 MG tablet Take 15 mg by mouth daily.  05/17/17  Yes [provider]  metoprolol tartrate (LOPRESSOR) 25 MG tablet Take 12.5-25 mg by mouth 2 (two) times daily. 12.5mg  in the morning and 25mg  at bedtime 06/19/17  Yes [provider]  potassium chloride SA (K-DUR,KLOR-CON) 20 MEQ tablet Take 20 mEq by mouth daily.     Yes [provider]    Inpatient Medications: Scheduled Meds: . amLODipine  10 mg Oral Daily  . aspirin  81 mg Oral Daily  . citalopram  40 mg Oral Daily  . enoxaparin (LOVENOX) injection  55 mg Subcutaneous Q24H  . losartan  100 mg Oral Daily   And  . hydrochlorothiazide  25 mg Oral Daily  . omega-3 acid ethyl esters  1 g Oral Daily  . potassium chloride SA  20 mEq Oral Daily   Continuous Infusions:  PRN Meds: acetaminophen, gi cocktail, hydrALAZINE, morphine injection, ondansetron (ZOFRAN) IV  Allergies:    Allergies  Allergen Reactions  . Lipitor [  Atorvastatin]     Pain in neck and legs after taking medication    Social History:   Social History   Social History  . Marital status: Divorced    Spouse name: N/A  . Number of children: N/A  . Years of education: N/A   Occupational History  . Not on file.   Social History Main Topics  . Smoking status: Former Smoker    Types: Cigarettes    Quit date: 09/24/1988  . Smokeless tobacco: Never Used     Comment: tobacco use - no  . Alcohol use No  . Drug use: No  . Sexual activity: Not on file   Other Topics Concern  . Not on file   Social History Narrative   Full  time; single.     Family History:    Family History  Problem Relation Age of Onset  . Stroke Father 2  . Heart attack Father 40  . CAD Mother   . Prostate cancer Brother   . Thyroid nodules Brother      ROS:  Please see the history of present illness.  Review of Systems  Constitution: Positive for malaise/fatigue.  HENT: Negative.   Cardiovascular: Positive for chest pain.  Respiratory: Negative.   Endocrine: Negative.   Hematologic/Lymphatic: Negative.   Musculoskeletal: Negative.   Gastrointestinal: Positive for heartburn.  Genitourinary: Negative.   Neurological: Negative.     All other ROS reviewed and negative.     Physical Exam/Data:   Vitals:   07/16/17 2024 07/16/17 2300 07/17/17 0300 07/17/17 0700  BP:  (!) 144/88 (!) 165/96 (!) 149/91  Pulse:  (!) 50 (!) 52 (!) 50  Resp:  20 18 18   Temp:  98.2 F (36.8 C) (!) 97.3 F (36.3 C) (!) 97.4 F (36.3 C)  TempSrc:  Oral Oral Oral  SpO2: 98% 97% 96% 98%  Weight:      Height:        Intake/Output Summary (Last 24 hours) at 07/17/17 0807 Last data filed at 07/16/17 1700  Gross per 24 hour  Intake              240 ml  Output                0 ml  Net              240 ml   Filed Weights   07/16/17 0928 07/16/17 1551  Weight: 244 lb (110.7 kg) 244 lb (110.7 kg)   Body mass index is 34.03 kg/m.  General:  Obese, in no acute distress HEENT: normal Lymph: no adenopathy Neck: no JVD Endocrine:  No thryomegaly Vascular: No carotid bruits; FA pulses 2+ bilaterally without bruits  Cardiac:  normal S1, S2; RRR; no murmur  Lungs:  clear to auscultation bilaterally, no wheezing, rhonchi or rales  Abd: soft, nontender, no hepatomegaly  Ext: no edema Musculoskeletal:  No deformities, BUE and BLE strength normal and equal Skin: warm and dry  Neuro:  CNs 2-12 intact, no focal abnormalities noted Psych:  Normal affect   EKG:  The EKG was personally reviewed and demonstrates: sinus bradycardia with poor R-wave  progression anteriorly and nonspecific ST-T wave changes   Telemetry:  Telemetry was personally reviewed and demonstrates:  Sinus brady with PAC's and PVC's  Relevant CV Studies: Echo done yesterday pending  Laboratory Data:  Chemistry Recent Labs Lab 07/16/17 1018  NA 138  K 4.2  CL 101  CO2 28  GLUCOSE 109*  BUN 21*  CREATININE 1.17  CALCIUM 9.2  GFRNONAA >60  GFRAA >60  ANIONGAP 9    No results for input(s): PROT, ALBUMIN, AST, ALT, ALKPHOS, BILITOT in the last 168 hours. Hematology Recent Labs Lab 07/16/17 1018  WBC 5.9  RBC 4.53  HGB 14.6  HCT 43.4  MCV 95.8  MCH 32.2  MCHC 33.6  RDW 13.2  PLT 135*   Cardiac Enzymes Recent Labs Lab 07/16/17 1018 07/16/17 1750 07/16/17 2040  TROPONINI 0.06* 0.05* 0.04*    Recent Labs Lab 07/16/17 1432  TROPIPOC 0.03    BNPNo results for input(s): BNP, PROBNP in the last 168 hours.  DDimer No results for input(s): DDIMER in the last 168 hours.  Radiology/Studies:  Dg Chest 2 View  Result Date: 07/16/2017 CLINICAL DATA:  Chest pain. EXAM: CHEST  2 VIEW COMPARISON:  No recent prior. FINDINGS: Mediastinum and hilar structures normal. Nodular density noted over the right lung base most likely a nipple shadow. Repeat chest x-ray with nipple markers suggested. No pleural effusion or pneumothorax. Cardiomegaly with normal pulmonary vascularity. IMPRESSION: Nodular density noted over the right lung base, most likely a nipple shadow. Repeat chest x-ray with nipple markers suggested. No acute bony or joint abnormality identified. Electronically Signed   By: Marcello Moores  Register   On: 07/16/2017 10:36    Assessment and Plan:  1.Chest pain with nonspecific ST changes on EKG and borderline elevated troponins, multiple CRF including father died MI 8. Discussed with Dr. Bronson Ing who recommends cardiac cath.I have reviewed the risks, indications, and alternatives to angioplasty and stenting with the patient. Risks include but are not  limited to bleeding, infection, vascular injury, stroke, myocardial infection, arrhythmia, kidney injury, radiation-related injury in the case of prolonged fluoroscopy use, emergency cardiac surgery, and death. The patient understands the risks of serious complication is low (<1%) and he agrees to proceed.  Hypertension controlled. Hold beta blocker with sinus brady Hyperlipidemia untreated, intolerant to statins.  Obesity Hiatal hernia with GERD Anxiety Family history of early CAD     For questions or updates, please contact [facility] Please consult [email or web address] for contact info under Cardiology/STEMI.   Signed, Ermalinda Barrios, PA-C  07/17/2017 8:07 AM   The patient was seen and examined, and I agree with the history, physical exam, assessment and plan as documented above, with modifications as noted below. I have also personally reviewed all relevant documentation, old records, labs, and both radiographic and cardiovascular studies. I have also independently interpreted old and new ECG's.  61 yr old male with hypertension, hypercholesterolemia, and prior h/o tobacco abuse (smoked 2.5 ppd, quit in 1990 and smoked for 13 years) admitted with chest pain. He has chronic pains which have been ongoing for 30+ years which start in his right flank and radiate up to the central part of his chest. He his a bit of a tangential historian, but says he was awoken by upper retrosternal chest pain which radiated to his back. He denies associated shortness of breath, diaphoresis, nausea, and vomiting. He was afraid of hospital costs so did not come to ED. He saw his PCP (Dr. Hilma Favors) who did an ECG, told him it was ok, but recommended he be evaluated in the ED.  Says he lives alone in a mobile home and only eats out. He has an exercise bicycle which he rarely uses. 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  He denies recent exertional  symptoms.  Troponins have been marginally elevated peaking at 0.06.  ECG showed sinus rhythm with late R wave transition with one ECG demonstrating nonspecific precordial T wave changes.  Chest xray was unremarkable.  Recommendations: Unclear etiology of symptoms but marginal troponin elevation with nonspecific precordial T wave abnormalities and ongoing "chest soreness" make me concerned about the possibility of ischemic heart disease.  He is worried about multiple tests and associated costs as well. He has multiple CV risk factors.  I will order a 2-D echocardiogram with Doppler to evaluate cardiac structure, function, and regional wall motion.  I will arrange for coronary angiography.   Kate Sable, MD, [facility]  07/17/2017 9:32 AM

## 2017-07-17 NOTE — Care Management Note (Signed)
Case Management Note  Patient Details  Name: BEECHER FURIO MRN: [id number] Date of Birth: 01/12/1956  Subjective/Objective:   From home alone, s/p coronary stent intervention, will be on brilinta, NCM awaiting benefit check for brilinta.                  Action/Plan: NCM awaiting benefit check for brilinta.  Expected Discharge Date:                  Expected Discharge Plan:  Home/Self Care  In-House Referral:     Discharge planning Services  CM Consult  Post Acute Care Choice:    Choice offered to:     DME Arranged:    DME Agency:     HH Arranged:    HH Agency:     Status of Service:  In process, will continue to follow  If discussed at Long Length of Stay Meetings, dates discussed:    Additional Comments:  Zenon Mayo, RN 07/17/2017, 5:06 PM

## 2017-07-17 NOTE — Care Management (Signed)
Patient concerned about cost of heart cath. Placed call to pre service center to inquire. Will f/u with patient.

## 2017-07-17 NOTE — Consult Note (Signed)
Cardiology Consultation:   Patient ID: Stephen Graves; [id number]; January 08, 1956   Admit date: 07/16/2017 Date of Consult: 07/17/2017  Primary Care Provider: [facility] Primary Cardiologist: New Stephen Graves Primary Electrophysiologist:  NA   Patient Profile:   Stephen Graves is a 61 y.o. male with a hx of hiatal hernia who is being seen today for the evaluation of chest pain at the request of Stephen Graves.  History of Present Illness:   Stephen Graves is a 61 year old male patient with history of hypertension, HLD, obesity, former smoker, had a hernia and anxiety. He awakened at 1 AM with chest pressure described as severe and radiating to his back unrelieved with drinking water but usually takes away his hiatal hernia pain. The pain gradually went away after 1 1/2 hrs but returned yest morning and he went to Stephen Graves office trying to save money but was sent to the ER. EKG sinus bradycardia with poor R-wave progression anteriorly and nonspecific ST-T wave changes. Troponin 0.06, 0.05 and 0.04. Blood pressure was elevated on admission heart rate 40s to 50s. Patient says his HH pain starts on lower right abdomen and radiates across his chest eases with drinking H2O. This pain was very different. 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 Father died 58 MI, mother with angina. Didn't tolerate statins so untreated HLD. Quit smoking 1990.     Past Medical History:  Diagnosis Date  . Anxiety   . Hiatal hernia   . High cholesterol   . HTN (hypertension)   . Skull fracture [facility])     Past Surgical History:  Procedure Laterality Date  . FOOT SURGERY Left   . left wrist surgery    . SKIN GRAFT  1998     Home Medications:  Prior to Admission medications   Medication Sig Start Date End Date Taking? Authorizing Provider  amLODipine (NORVASC) 10 MG tablet Take 10 mg by mouth daily.   Yes [provider]  aspirin 81 MG  chewable tablet Chew 81 mg by mouth daily.   Yes [provider]  citalopram (CELEXA) 40 MG tablet Take 40 mg by mouth daily.  06/17/17  Yes [provider]  fish oil-omega-3 fatty acids 1000 MG capsule Take 1 g by mouth daily.   Yes [provider]  losartan-hydrochlorothiazide (HYZAAR) 100-25 MG tablet Take 1 tablet by mouth daily.  05/20/17  Yes [provider]  meloxicam (MOBIC) 15 MG tablet Take 15 mg by mouth daily.  05/17/17  Yes [provider]  metoprolol tartrate (LOPRESSOR) 25 MG tablet Take 12.5-25 mg by mouth 2 (two) times daily. 12.5mg  in the morning and 25mg  at bedtime 06/19/17  Yes [provider]  potassium chloride SA (K-DUR,KLOR-CON) 20 MEQ tablet Take 20 mEq by mouth daily.     Yes [provider]    Inpatient Medications: Scheduled Meds: . amLODipine  10 mg Oral Daily  . aspirin  81 mg Oral Daily  . citalopram  40 mg Oral Daily  . enoxaparin (LOVENOX) injection  55 mg Subcutaneous Q24H  . losartan  100 mg Oral Daily   And  . hydrochlorothiazide  25 mg Oral Daily  . omega-3 acid ethyl esters  1 g Oral Daily  . potassium chloride SA  20 mEq Oral Daily   Continuous Infusions:  PRN Meds: acetaminophen, gi cocktail, hydrALAZINE, morphine injection, ondansetron (ZOFRAN) IV  Allergies:    Allergies  Allergen Reactions  . Lipitor [  Atorvastatin]     Pain in neck and legs after taking medication    Social History:   Social History   Social History  . Marital status: Divorced    Spouse name: N/A  . Number of children: N/A  . Years of education: N/A   Occupational History  . Not on file.   Social History Main Topics  . Smoking status: Former Smoker    Types: Cigarettes    Quit date: 09/24/1988  . Smokeless tobacco: Never Used     Comment: tobacco use - no  . Alcohol use No  . Drug use: No  . Sexual activity: Not on file   Other Topics Concern  . Not on file   Social History Narrative   Full  time; single.     Family History:    Family History  Problem Relation Age of Onset  . Stroke Father 68  . Heart attack Father 38  . CAD Mother   . Prostate cancer Brother   . Thyroid nodules Brother      ROS:  Please see the history of present illness.  Review of Systems  Constitution: Positive for malaise/fatigue.  HENT: Negative.   Cardiovascular: Positive for chest pain.  Respiratory: Negative.   Endocrine: Negative.   Hematologic/Lymphatic: Negative.   Musculoskeletal: Negative.   Gastrointestinal: Positive for heartburn.  Genitourinary: Negative.   Neurological: Negative.     All other ROS reviewed and negative.     Physical Exam/Data:   Vitals:   07/16/17 2024 07/16/17 2300 07/17/17 0300 07/17/17 0700  BP:  (!) 144/88 (!) 165/96 (!) 149/91  Pulse:  (!) 50 (!) 52 (!) 50  Resp:  20 18 18   Temp:  98.2 F (36.8 C) (!) 97.3 F (36.3 C) (!) 97.4 F (36.3 C)  TempSrc:  Oral Oral Oral  SpO2: 98% 97% 96% 98%  Weight:      Height:        Intake/Output Summary (Last 24 hours) at 07/17/17 0807 Last data filed at 07/16/17 1700  Gross per 24 hour  Intake              240 ml  Output                0 ml  Net              240 ml   Filed Weights   07/16/17 0928 07/16/17 1551  Weight: 244 lb (110.7 kg) 244 lb (110.7 kg)   Body mass index is 34.03 kg/m.  General:  Obese, in no acute distress HEENT: normal Lymph: no adenopathy Neck: no JVD Endocrine:  No thryomegaly Vascular: No carotid bruits; FA pulses 2+ bilaterally without bruits  Cardiac:  normal S1, S2; RRR; no murmur  Lungs:  clear to auscultation bilaterally, no wheezing, rhonchi or rales  Abd: soft, nontender, no hepatomegaly  Ext: no edema Musculoskeletal:  No deformities, BUE and BLE strength normal and equal Skin: warm and dry  Neuro:  CNs 2-12 intact, no focal abnormalities noted Psych:  Normal affect   EKG:  The EKG was personally reviewed and demonstrates: sinus bradycardia with poor R-wave  progression anteriorly and nonspecific ST-T wave changes   Telemetry:  Telemetry was personally reviewed and demonstrates:  Sinus brady with PAC's and PVC's  Relevant CV Studies: Echo done yesterday pending  Laboratory Data:  Chemistry Recent Labs Lab 07/16/17 1018  NA 138  K 4.2  CL 101  CO2 28  GLUCOSE 109*  BUN 21*  CREATININE 1.17  CALCIUM 9.2  GFRNONAA >60  GFRAA >60  ANIONGAP 9    No results for input(s): PROT, ALBUMIN, AST, ALT, ALKPHOS, BILITOT in the last 168 hours. Hematology Recent Labs Lab 07/16/17 1018  WBC 5.9  RBC 4.53  HGB 14.6  HCT 43.4  MCV 95.8  MCH 32.2  MCHC 33.6  RDW 13.2  PLT 135*   Cardiac Enzymes Recent Labs Lab 07/16/17 1018 07/16/17 1750 07/16/17 2040  TROPONINI 0.06* 0.05* 0.04*    Recent Labs Lab 07/16/17 1432  TROPIPOC 0.03    BNPNo results for input(s): BNP, PROBNP in the last 168 hours.  DDimer No results for input(s): DDIMER in the last 168 hours.  Radiology/Studies:  Dg Chest 2 View  Result Date: 07/16/2017 CLINICAL DATA:  Chest pain. EXAM: CHEST  2 VIEW COMPARISON:  No recent prior. FINDINGS: Mediastinum and hilar structures normal. Nodular density noted over the right lung base most likely a nipple shadow. Repeat chest x-ray with nipple markers suggested. No pleural effusion or pneumothorax. Cardiomegaly with normal pulmonary vascularity. IMPRESSION: Nodular density noted over the right lung base, most likely a nipple shadow. Repeat chest x-ray with nipple markers suggested. No acute bony or joint abnormality identified. Electronically Signed   By: Marcello Moores  Register   On: 07/16/2017 10:36    Assessment and Plan:  1.Chest pain with nonspecific ST changes on EKG and borderline elevated troponins, multiple CRF including father died MI 28. Discussed with Stephen Graves who recommends cardiac cath.I have reviewed the risks, indications, and alternatives to angioplasty and stenting with the patient. Risks include but are not  limited to bleeding, infection, vascular injury, stroke, myocardial infection, arrhythmia, kidney injury, radiation-related injury in the case of prolonged fluoroscopy use, emergency cardiac surgery, and death. The patient understands the risks of serious complication is low (<4%) and he agrees to proceed.  Hypertension controlled. Hold beta blocker with sinus brady Hyperlipidemia untreated, intolerant to statins.  Obesity Hiatal hernia with GERD Anxiety Family history of early CAD     For questions or updates, please contact [facility] Please consult [email or web address] for contact info under Cardiology/STEMI.   Signed, Stephen Barrios, PA-C  07/17/2017 8:07 AM   The patient was seen and examined, and I agree with the history, physical exam, assessment and plan as documented above, with modifications as noted below. I have also personally reviewed all relevant documentation, old records, labs, and both radiographic and cardiovascular studies. I have also independently interpreted old and new ECG's.  61 yr old male with hypertension, hypercholesterolemia, and prior h/o tobacco abuse (smoked 2.5 ppd, quit in 1990 and smoked for 13 years) admitted with chest pain. He has chronic pains which have been ongoing for 30+ years which start in his right flank and radiate up to the central part of his chest. He his a bit of a tangential historian, but says he was awoken by upper retrosternal chest pain which radiated to his back. He denies associated shortness of breath, diaphoresis, nausea, and vomiting. He was afraid of hospital costs so did not come to ED. He saw his PCP (Stephen Graves) who did an ECG, told him it was ok, but recommended he be evaluated in the ED.  Says he lives alone in a mobile home and only eats out. He has an exercise bicycle which he rarely uses. 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  He denies recent exertional  symptoms.  Troponins have been marginally elevated peaking at 0.06.  ECG showed sinus rhythm with late R wave transition with one ECG demonstrating nonspecific precordial T wave changes.  Chest xray was unremarkable.  Recommendations: Unclear etiology of symptoms but marginal troponin elevation with nonspecific precordial T wave abnormalities and ongoing "chest soreness" make me concerned about the possibility of ischemic heart disease.  He is worried about multiple tests and associated costs as well. He has multiple CV risk factors.  I will order a 2-D echocardiogram with Doppler to evaluate cardiac structure, function, and regional wall motion.  I will arrange for coronary angiography.   Kate Sable, MD, [facility]  07/17/2017 9:32 AM

## 2017-07-17 NOTE — Progress Notes (Signed)
*  PRELIMINARY RESULTS* Echocardiogram 2D Echocardiogram has been performed.  Leavy Cella 07/17/2017, 10:42 AM

## 2017-07-17 NOTE — Care Management (Signed)
Spoke with someone from pre service center, they will need to speak with patient to find out more information in regards to estimating cost. Notified patient and gave him number for pre service center. Patient has elected to proceed with procedure and will transfer to [facility] for cath.

## 2017-07-17 NOTE — Progress Notes (Addendum)
Pt in cath holding via [facility] from [facility] pre cardiac cath. Awake alert, oriented, MAE, no report of pain. Consent signed, IV started. SR with PAC's. BP 119/72, O2 sat 97 on room air. No family available at this time. To procedure room [phone]

## 2017-07-17 NOTE — Progress Notes (Signed)
PROGRESS NOTE    Stephen Graves  KGY:[id number] DOB: April 04, 1956 DOA: 07/16/2017 PCP: [facility]    Brief Narrative:  61 year old male who presents with chest pain. Patient does have a significant past medical history of hypertension, dyslipidemia, obesity, anxiety, hiatal hernia and former tobacco abuse. Patient experienced dull type pain chest pain, while supine sleeping. The pain was precordial, radiated to his back, with no associated symptoms, it self resolved, but reoccur with more intensity a few hours later, that also improved by itself. Patient had experienced this pain before, and has been relieved by water ingestion. The night of the symptoms he particularly have a large meal. He has been told to have a hiatal hernia in the past. On initial physical examination blood pressure 163/92, heart rate 49, respiratory rate 14, oxygen saturation 98%, moist mucous membranes, lungs clear to auscultation bilaterally, no wheezing rales or rhonchi, heart S1-S2 present rhythmic, no gallops or murmurs, abdomen was soft nontender, protuberant, no lower extremity edema. Sodium 138, potassium 4.2, chloride 101, bicarbonate 28, glucose 109, BUN 21, creatinine 1.17, troponin 0.06, 0.05, 0.04. White count 5.9, hemoglobin 14.6, hematocrit 43.4, platelets 135. Chest x-ray negative for infiltrates. EKG sinus rhythm, premature atrial complexes, poor R-wave progression.  Patient was admitted to hospital with the working diagnosis of atypical chest pain.  Assessment & Plan:   Active Problems:   Essential hypertension   Chest pain   Hyperlipidemia  1. Atypical chest pain. Chest pain has improved, not reported angina at home, troponin trending down, will follow on coronary angiography report. Will add pantoprazole for acid supresive therapy.   2. HTN. Will continue blood pressure control with amlodipine, losartan and hctz.   3. Dyslipidemia. Follow on lipid profile, patient did not  tolerate statin therapy in the past.   4. Anxiety. Will continue citalopram   DVT prophylaxis: enoxaparin   Code Status:  Full Family Communication:  Family at the bedside and all questions were addressed Disposition Plan: home   Consultants:   Cardiology   Procedures:     Antimicrobials:       Subjective: No further chest pain, no nausea or vomiting, no dyspnea. At home denies any angina. Has history GERD.   Objective: Vitals:   07/16/17 2300 07/17/17 0300 07/17/17 0700 07/17/17 1052  BP: (!) 144/88 (!) 165/96 (!) 149/91 (!) 156/95  Pulse: (!) 50 (!) 52 (!) 50 70  Resp: 20 18 18    Temp: 98.2 F (36.8 C) (!) 97.3 F (36.3 C) (!) 97.4 F (36.3 C)   TempSrc: Oral Oral Oral   SpO2: 97% 96% 98% 98%  Weight:      Height:        Intake/Output Summary (Last 24 hours) at 07/17/17 1225 Last data filed at 07/16/17 1700  Gross per 24 hour  Intake              240 ml  Output                0 ml  Net              240 ml   Filed Weights   07/16/17 0928 07/16/17 1551  Weight: 110.7 kg (244 lb) 110.7 kg (244 lb)    Examination:   General: Not in pain or dyspnea.  Neurology: Awake and alert, non focal  E ENT: no pallor, no icterus, oral mucosa moist Cardiovascular: No JVD. S1-S2 present, rhythmic, no gallops, rubs, or murmurs. No lower extremity edema. Pulmonary:  vesicular breath sounds bilaterally, adequate air movement, no wheezing, rhonchi or rales. Gastrointestinal. Abdomen protuberant, no organomegaly, non tender, no rebound or guarding Skin. No rashes Musculoskeletal: no joint deformities     Data Reviewed: I have personally reviewed following labs and imaging studies  CBC:  Recent Labs Lab 07/16/17 1018  WBC 5.9  HGB 14.6  HCT 43.4  MCV 95.8  PLT 694*   Basic Metabolic Panel:  Recent Labs Lab 07/16/17 1018  NA 138  K 4.2  CL 101  CO2 28  GLUCOSE 109*  BUN 21*  CREATININE 1.17  CALCIUM 9.2   GFR: Estimated Creatinine Clearance:  81.8 mL/min (by C-G formula based on SCr of 1.17 mg/dL). Liver Function Tests: No results for input(s): AST, ALT, ALKPHOS, BILITOT, PROT, ALBUMIN in the last 168 hours. No results for input(s): LIPASE, AMYLASE in the last 168 hours. No results for input(s): AMMONIA in the last 168 hours. Coagulation Profile: No results for input(s): INR, PROTIME in the last 168 hours. Cardiac Enzymes:  Recent Labs Lab 07/16/17 1018 07/16/17 1750 07/16/17 2040  TROPONINI 0.06* 0.05* 0.04*   BNP (last 3 results) No results for input(s): PROBNP in the last 8760 hours. HbA1C: No results for input(s): HGBA1C in the last 72 hours. CBG: No results for input(s): GLUCAP in the last 168 hours. Lipid Profile: No results for input(s): CHOL, HDL, LDLCALC, TRIG, CHOLHDL, LDLDIRECT in the last 72 hours. Thyroid Function Tests: No results for input(s): TSH, T4TOTAL, FREET4, T3FREE, THYROIDAB in the last 72 hours. Anemia Panel: No results for input(s): VITAMINB12, FOLATE, FERRITIN, TIBC, IRON, RETICCTPCT in the last 72 hours.    Radiology Studies: I have reviewed all of the imaging during this hospital visit personally     Scheduled Meds: . amLODipine  10 mg Oral Daily  . aspirin  81 mg Oral Daily  . citalopram  40 mg Oral Daily  . enoxaparin (LOVENOX) injection  55 mg Subcutaneous Q24H  . losartan  100 mg Oral Daily   And  . hydrochlorothiazide  25 mg Oral Daily  . omega-3 acid ethyl esters  1 g Oral Daily  . potassium chloride SA  20 mEq Oral Daily   Continuous Infusions:   LOS: 0 days        Misk Galentine Gerome Apley, MD [facility] Pager [phone]

## 2017-07-17 NOTE — Progress Notes (Signed)
I saw and evaluated the patient, I personally obtain the key portions of the history and physical exam, I reviewed the physician assistant student documentation and agree with the physician assistant student medical decision-making. Further assessment and plan are as follows:  Please see attending's daily progress note.  Stephen Graves M.D. PROGRESS NOTE  Stephen Graves ZSW:[id number] DOB: 1956-05-14 DOA: 07/16/2017 PCP: [facility]   LOS: 0 days   Brief Narrative / Interim history: 61 year-old male with history of HTN, HLD not currently on statin therapy and family history of CAD presents with several episodes of midsternal chest pain radiating to the left arm over the past week. Pain is non-exertional. One episode woke him from sleep. No associated n/v, abd pain, heartburn, or reflux. No SOB, but pt does feel tired with exertion. Admitted for chest pain rule out. Troponins mildly elevated x 3. EKG without acute ST-segment changes.   Assessment & Plan: Active Problems:   Essential hypertension   Chest pain   Hyperlipidemia   Chest pain Etiology somewhat unclear. Pain occurred after a large meal, suggesting GI source. Pain pattern not typical of CAD, but strong family history and personal risk factors. Troponin mildly elevated x3. Have consulted cardiology, appreciate input. 2D echocardiogram performed this morning, results noted below. Cardiology recommends diagnostic cath, will arrange for transport to [facility] for same. Pt expresses concern about cost of procedure, case management will see him. Currently pain-free. Will continue ASA and PRN NTG for now. Also starting PPI.    Chronic essential hypertension Stable. Currently on 3 anti-hypertensive agents. Continuing amlodipine, losartan, and HCTZ. Encourage outpatient f/u for tighter BP control.   Hyperlipidemia No lipid values available for review. Pt reports having been on a statin in the past, but  discontinued use 2/2 adverse effects. Will check lipids and assess ASCVD risk.   DVT prophylaxis: Lovenox Code Status: full code Family Communication: sister at bedside Disposition Plan: transfer to [facility]: [facility] for cardiac cath  Consultants:   Cardiology  Procedures:  2D echo:  Study Conclusions  - Left ventricle: The cavity size was normal. Wall thickness was   increased in a pattern of mild LVH. Systolic function was normal.   The estimated ejection fraction was in the range of 60% to 65%.   Wall motion was normal; there were no regional wall motion   abnormalities. Diastolic dysfunction, grade indeterminate. - Aortic valve: Moderately calcified annulus. Trileaflet.  Antimicrobials:  None   Subjective: Pt examined at bedside. Pt is feeling well, but is worried about cost and need for additional treatment. No CP, SOB, N/V, or dizziness/syncope.  Objective: Vitals:   07/16/17 2024 07/16/17 2300 07/17/17 0300 07/17/17 0700  BP:  (!) 144/88 (!) 165/96 (!) 149/91  Pulse:  (!) 50 (!) 52 (!) 50  Resp:  20 18 18   Temp:  98.2 F (36.8 C) (!) 97.3 F (36.3 C) (!) 97.4 F (36.3 C)  TempSrc:  Oral Oral Oral  SpO2: 98% 97% 96% 98%  Weight:      Height:        Intake/Output Summary (Last 24 hours) at 07/17/17 1053 Last data filed at 07/16/17 1700  Gross per 24 hour  Intake              240 ml  Output                0 ml  Net  240 ml   Filed Weights   07/16/17 0928 07/16/17 1551  Weight: 110.7 kg (244 lb) 110.7 kg (244 lb)    Examination:  Constitutional: Well-appearing, non-distressed Eyes: lids and conjunctivae normal ENMT: Mucous membranes are moist.  Neck: normal, supple, no masses Respiratory: clear to auscultation bilaterally, no wheezing, no crackles. Normal respiratory effort. No accessory muscle use.  Cardiovascular: Regular rate and rhythm, no murmurs / rubs / gallops. No LE edema. 2+ pedal pulses. No carotid bruits.  Abdomen: no tenderness. Bowel  sounds positive.  Musculoskeletal: no clubbing / cyanosis. No joint deformity upper and lower extremities. No contractures. Normal muscle tone.  Skin: no rashes, lesions, ulcers.  Neurologic: A&Ox4, grossly non-focal Psychiatric: Normal judgment and insight. Alert and oriented x 3. Normal mood.    Data Reviewed: I have independently reviewed following labs and imaging studies  CBC:  Recent Labs Lab 07/16/17 1018  WBC 5.9  HGB 14.6  HCT 43.4  MCV 95.8  PLT 272*   Basic Metabolic Panel:  Recent Labs Lab 07/16/17 1018  NA 138  K 4.2  CL 101  CO2 28  GLUCOSE 109*  BUN 21*  CREATININE 1.17  CALCIUM 9.2   GFR: Estimated Creatinine Clearance: 81.8 mL/min (by C-G formula based on SCr of 1.17 mg/dL). Liver Function Tests: No results for input(s): AST, ALT, ALKPHOS, BILITOT, PROT, ALBUMIN in the last 168 hours. No results for input(s): LIPASE, AMYLASE in the last 168 hours. No results for input(s): AMMONIA in the last 168 hours. Coagulation Profile: No results for input(s): INR, PROTIME in the last 168 hours. Cardiac Enzymes:  Recent Labs Lab 07/16/17 1018 07/16/17 1750 07/16/17 2040  TROPONINI 0.06* 0.05* 0.04*   BNP (last 3 results) No results for input(s): PROBNP in the last 8760 hours. HbA1C: No results for input(s): HGBA1C in the last 72 hours. CBG: No results for input(s): GLUCAP in the last 168 hours. Lipid Profile: No results for input(s): CHOL, HDL, LDLCALC, TRIG, CHOLHDL, LDLDIRECT in the last 72 hours. Thyroid Function Tests: No results for input(s): TSH, T4TOTAL, FREET4, T3FREE, THYROIDAB in the last 72 hours. Anemia Panel: No results for input(s): VITAMINB12, FOLATE, FERRITIN, TIBC, IRON, RETICCTPCT in the last 72 hours. Urine analysis: No results found for: COLORURINE, APPEARANCEUR, LABSPEC, PHURINE, GLUCOSEU, HGBUR, BILIRUBINUR, KETONESUR, PROTEINUR, UROBILINOGEN, NITRITE, LEUKOCYTESUR Sepsis Labs: Invalid input(s): PROCALCITONIN,  LACTICIDVEN  No results found for this or any previous visit (from the past 240 hour(s)).    Radiology Studies: Dg Chest 2 View  Result Date: 07/16/2017 CLINICAL DATA:  Chest pain. EXAM: CHEST  2 VIEW COMPARISON:  No recent prior. FINDINGS: Mediastinum and hilar structures normal. Nodular density noted over the right lung base most likely a nipple shadow. Repeat chest x-ray with nipple markers suggested. No pleural effusion or pneumothorax. Cardiomegaly with normal pulmonary vascularity. IMPRESSION: Nodular density noted over the right lung base, most likely a nipple shadow. Repeat chest x-ray with nipple markers suggested. No acute bony or joint abnormality identified. Electronically Signed   By: [place]   On: 07/16/2017 10:36     Scheduled Meds: . amLODipine  10 mg Oral Daily  . aspirin  81 mg Oral Daily  . citalopram  40 mg Oral Daily  . enoxaparin (LOVENOX) injection  55 mg Subcutaneous Q24H  . losartan  100 mg Oral Daily   And  . hydrochlorothiazide  25 mg Oral Daily  . omega-3 acid ethyl esters  1 g Oral Daily  . potassium chloride SA  20  mEq Oral Daily   Continuous Infusions:     Time spent: 15 minutes    Audery Amel, PA-S 07/17/2017, 10:53 AM   @[email or web address] @

## 2017-07-17 NOTE — Plan of Care (Signed)
Problem: Education: Goal: Knowledge of  General Education information/materials will improve Outcome: Progressing Discussed and reviewed plan of care with patient

## 2017-07-17 NOTE — Progress Notes (Signed)
TR BAND REMOVA1L  LOCATION:    right radial  DEFLATED PER PROTOCOL:    Yes.    TIME BAND OFF / DRESSING APPLIED:    1900   SITE UPON ARRIVAL:    Level 1( small hematoma)  SITE AFTER BAND REMOVAL:    Level 1( small hematoma, stable)  CIRCULATION SENSATION AND MOVEMENT:    Within Normal Limits   Yes.    COMMENTS:  Tolerated procedure well

## 2017-07-17 NOTE — Plan of Care (Signed)
Problem: Food- and Nutrition-Related Knowledge Deficit (NB-1.1) Goal: Nutrition education Formal process to instruct or train a patient/client in a skill or to impart knowledge to help patients/clients voluntarily manage or modify food choices and eating behavior to maintain or improve health. Outcome: Adequate for Discharge Nutrition Education Note  RD consulted for nutrition education regarding a Heart Healthy diet. He is obese,presents with chest pain and has hyperlipidemia.  Lipid Panel  No results found for: CHOL, TRIG, HDL, CHOLHDL, VLDL, LDLCALC  RD provided "Heart Healthy Nutrition Therapy" handout from the Academy of Nutrition and Dietetics.   Reviewed patient's dietary recall. He eats daily breakfast and dinner. 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 He has a number of restaurants he frequents such as [employer], Development worker, community, Therapist, sports, [place], [employer] , [employer] and [place]. Breakfast is usually bacon and waffle or pancakes with coffee. Lunch (school)- pizza 1-2 times weekly and whatever is on trayline for the day. Dinner_ stops to eat at [employer] most days-likes hamburger steak, country style steak mashed potatoes. At night he snacks on [employer] and ice cream. His diet is high is saturated fat, sodium, simple sugar and excess energy.    Provided examples on ways to decrease sodium and fat intake in diet. Discouraged intake of processed foods and use of salt shaker. Encouraged fresh fruits and vegetables as well as whole grain sources of carbohydrates to maximize fiber intake. Teach back method used.  Expect good compliance. Patient verbalizes desire to make dietary modfications to improve his overall health. We focused on setting goal initially to decrease portions and frequency of foods such as bacon, cheese and beef. Incorporate a meatless meal night with pinto beans and other non-starchy vegetables. Choose to eat at home more  often.    Body mass index is 34.03 kg/m. Pt meets criteria for obesity class I based on current BMI.  Current diet order is Heart Healthy, patient is consuming approximately 75-100% of meals at this time. Labs and medications reviewed. No further nutrition interventions warranted at this time. RD contact information provided. If additional nutrition issues arise, please re-consult RD.  Colman Cater MS,RD,CSG,LDN Office: [phone] Pager: [phone]

## 2017-07-17 NOTE — Plan of Care (Signed)
Problem: Education: Goal: Knowledge of [place] [facility] Education information/materials will improve Outcome: Progressing Discussing proper hand hygiene for ongoing infection prevention

## 2017-07-17 NOTE — Interval H&P Note (Signed)
History and Physical Interval Note:  07/17/2017 2:56 PM  Stephen Graves  has presented today for surgery, with the diagnosis of cp  The various methods of treatment have been discussed with the patient and family. After consideration of risks, benefits and other options for treatment, the patient has consented to  Procedure(s): LEFT HEART CATH AND CORONARY ANGIOGRAPHY (N/A) as a surgical intervention .  The patient's history has been reviewed, patient examined, no change in status, stable for surgery.  I have reviewed the patient's chart and labs.  Questions were answered to the patient's satisfaction.     [name]

## 2017-07-18 ENCOUNTER — Encounter (HOSPITAL_COMMUNITY): Payer: Self-pay | Admitting: Cardiovascular Disease

## 2017-07-18 DIAGNOSIS — I25110 Atherosclerotic heart disease of native coronary artery with unstable angina pectoris: Secondary | ICD-10-CM | POA: Clinically undetermined

## 2017-07-18 DIAGNOSIS — E7800 Pure hypercholesterolemia, unspecified: Secondary | ICD-10-CM | POA: Diagnosis not present

## 2017-07-18 DIAGNOSIS — I249 Acute ischemic heart disease, unspecified: Secondary | ICD-10-CM | POA: Diagnosis not present

## 2017-07-18 DIAGNOSIS — I10 Essential (primary) hypertension: Secondary | ICD-10-CM | POA: Diagnosis not present

## 2017-07-18 DIAGNOSIS — E785 Hyperlipidemia, unspecified: Secondary | ICD-10-CM

## 2017-07-18 DIAGNOSIS — Z955 Presence of coronary angioplasty implant and graft: Secondary | ICD-10-CM

## 2017-07-18 DIAGNOSIS — I214 Non-ST elevation (NSTEMI) myocardial infarction: Secondary | ICD-10-CM | POA: Diagnosis not present

## 2017-07-18 HISTORY — DX: Atherosclerotic heart disease of native coronary artery with unstable angina pectoris: I25.110

## 2017-07-18 LAB — BASIC METABOLIC PANEL
Anion gap: 9 (ref 5–15)
BUN: 15 mg/dL (ref 6–20)
CALCIUM: 9.1 mg/dL (ref 8.9–10.3)
CO2: 27 mmol/L (ref 22–32)
CREATININE: 1.18 mg/dL (ref 0.61–1.24)
Chloride: 101 mmol/L (ref 101–111)
Glucose, Bld: 103 mg/dL — ABNORMAL HIGH (ref 65–99)
Potassium: 3.6 mmol/L (ref 3.5–5.1)
SODIUM: 137 mmol/L (ref 135–145)

## 2017-07-18 LAB — LIPID PANEL
CHOL/HDL RATIO: 6 ratio
CHOLESTEROL: 198 mg/dL (ref 0–200)
HDL: 33 mg/dL — AB (ref >40)
LDL Cholesterol: 134 mg/dL — ABNORMAL HIGH (ref 0–99)
Triglycerides: 153 mg/dL — ABNORMAL HIGH (ref <150)
VLDL: 31 mg/dL (ref 0–40)

## 2017-07-18 LAB — CBC
HCT: 41.9 % (ref 39.0–52.0)
Hemoglobin: 14.3 g/dL (ref 13.0–17.0)
MCH: 32.1 pg (ref 26.0–34.0)
MCHC: 34.1 g/dL (ref 30.0–36.0)
MCV: 93.9 fL (ref 78.0–100.0)
PLATELETS: 137 10*3/uL — AB (ref 150–400)
RBC: 4.46 MIL/uL (ref 4.22–5.81)
RDW: 13.3 % (ref 11.5–15.5)
WBC: 5.6 10*3/uL (ref 4.0–10.5)

## 2017-07-18 MED ORDER — ROSUVASTATIN CALCIUM 20 MG PO TABS: 40.0000 mg | ORAL_TABLET | Freq: Every day | ORAL | Status: DC

## 2017-07-18 MED ORDER — ROSUVASTATIN CALCIUM 20 MG PO TABS: 20.0000 mg | ORAL_TABLET | Freq: Every day | ORAL | 3 refills | Status: DC

## 2017-07-18 MED ORDER — ROSUVASTATIN CALCIUM 20 MG PO TABS: 20.0000 mg | ORAL_TABLET | Freq: Every day | ORAL | Status: DC

## 2017-07-18 MED ORDER — TICAGRELOR 90 MG PO TABS: 90.0000 mg | ORAL_TABLET | Freq: Two times a day (BID) | ORAL | 3 refills | Status: DC

## 2017-07-18 MED ORDER — CARVEDILOL 3.125 MG PO TABS: 3.1250 mg | ORAL_TABLET | Freq: Two times a day (BID) | ORAL | 11 refills | Status: DC

## 2017-07-18 NOTE — Progress Notes (Signed)
CARDIAC REHAB PHASE I   PRE:  Rate/Rhythm: 53 SB  BP:  Sitting: 159/85    MODE:  Ambulation: 450 ft   POST:  Rate/Rhythm: 77 SR  BP:  Sitting: 165/93         SaO2: 99 RA  Pt ambulated 450 ft on RA, handheld assist, steady gait, tolerated well with no complaints other than feeling tired. Completed MI/stent education.  Reviewed risk factors, MI/PCI book, anti-platelet therapy, stent card (unable to locate), activity restrictions, ntg, exercise, heart healthy diet and phase 2 cardiac rehab. Pt verbalized understanding. Pt agrees to phase 2 cardiac rehab, will send to [place]. Pt very anxious, has a lot of financial concerns, is concerned he will not be able to afford his brilinta, eats out mostly, does not have a stove or oven, has an overall very poor diet, states he sometimes feels depressed. Emotional support given to pt. Pt to bed per pt request after walk, call bell within reach.   [place], RN, BSN 07/18/2017 9:17 AM

## 2017-07-18 NOTE — Progress Notes (Signed)
#   5. S/W DUNSINANE @ [facility] # [phone]   BRILINTA 90 MG  BID   COVER- YES  CO-PAY- $30.00  TIER- 2 DRUG  PRIOR APPROVAL- NO   PREFERRED PHARMACY : [facility]  90 DAY SUPPLY $90.00

## 2017-07-18 NOTE — Progress Notes (Signed)
Patient given stent card. Instructed to place in his belongings bag with his books.

## 2017-07-18 NOTE — Progress Notes (Signed)
Progress Note  Patient Name: Stephen Graves Date of Encounter: 07/18/2017  Primary Care Provider: [facility] Primary Cardiologist: New Dr. Bronson Ing  Requesting physician Dr. Eudelia Bunch  Subjective   Doing okay. No further anginal pain walk with cardiac rehabilitation and no symptoms. Very anxious and has multiple questions.  Inpatient Medications    Scheduled Meds: . amLODipine  10 mg Oral Daily  . aspirin  81 mg Oral Daily  . citalopram  40 mg Oral Daily  . losartan  100 mg Oral Daily   And  . hydrochlorothiazide  25 mg Oral Daily  . metoprolol tartrate  25 mg Oral BID  . omega-3 acid ethyl esters  1 g Oral Daily  . pantoprazole  40 mg Oral Daily  . potassium chloride SA  20 mEq Oral Daily  . rosuvastatin  10 mg Oral q1800  . sodium chloride flush  3 mL Intravenous Q12H  . ticagrelor  90 mg Oral BID   Continuous Infusions: . sodium chloride    . nitroGLYCERIN Stopped (07/17/17 1900)   PRN Meds: sodium chloride, acetaminophen, gi cocktail, hydrALAZINE, morphine injection, ondansetron (ZOFRAN) IV, sodium chloride flush   Vital Signs    Vitals:   07/18/17 0344 07/18/17 0820 07/18/17 0830 07/18/17 0930  BP: (!) 159/105 (!) 159/85 (!) 165/93   Pulse:  (!) 57 (!) 122 68  Resp: 13 (!) 21 (!) 21   Temp: 98.2 F (36.8 C) 98.3 F (36.8 C)    TempSrc: Oral Oral    SpO2: 98% 99%    Weight: 239 lb 9.6 oz (108.7 kg)     Height:        Intake/Output Summary (Last 24 hours) at 07/18/17 1105 Last data filed at 07/18/17 0800  Gross per 24 hour  Intake          1651.66 ml  Output             1245 ml  Net           406.66 ml   Filed Weights   07/16/17 0928 07/16/17 1551 07/18/17 0344  Weight: 244 lb (110.7 kg) 244 lb (110.7 kg) 239 lb 9.6 oz (108.7 kg)    Telemetry    Sinus rhythm with sinus bradycardia, rates in the 50s to 70s - Personally Reviewed  ECG - Personally Reviewed    Sinus bradycardia (52)with Premature atrial complexes  in a pattern of bigeminy ST & T wave abnormality, consider anterolateral ischemia (likely related to post PCI of LAD. Would expect this to return to baseline) Prolonged QT (513)  Abnormal ECG   Physical Exam   GEN: No acute distress.   Neck: No JVD Cardiac: RRR, no murmurs, rubs, or gallops.  Respiratory: Clear to auscultation bilaterally. GI: Soft, nontender, non-distended  MS: No edema; No deformity. Neuro:  Nonfocal  Psych: Normal affect   Labs    Chemistry Recent Labs Lab 07/16/17 1018 07/18/17 0423  NA 138 137  K 4.2 3.6  CL 101 101  CO2 28 27  GLUCOSE 109* 103*  BUN 21* 15  CREATININE 1.17 1.18  CALCIUM 9.2 9.1  GFRNONAA >60 >60  GFRAA >60 >60  ANIONGAP 9 9     Hematology Recent Labs Lab 07/16/17 1018 07/18/17 0423  WBC 5.9 5.6  RBC 4.53 4.46  HGB 14.6 14.3  HCT 43.4 41.9  MCV 95.8 93.9  MCH 32.2 32.1  MCHC 33.6 34.1  RDW 13.2 13.3  PLT 135* 137*  Cardiac Enzymes Recent Labs Lab 07/16/17 1018 07/16/17 1750 07/16/17 2040  TROPONINI 0.06* 0.05* 0.04*    Recent Labs Lab 07/16/17 1432  TROPIPOC 0.03     BNPNo results for input(s): BNP, PROBNP in the last 168 hours.   DDimer No results for input(s): DDIMER in the last 168 hours.   Radiology    No results found.  Cardiac Studies   Echo 10/24:- Left ventricle: The cavity size was normal. Wall thickness was   increased in a pattern of mild LVH. Systolic function was normal.   The estimated ejection fraction was in the range of 60% to 65%.   Wall motion was normal; there were no regional wall motion   abnormalities. Diastolic dysfunction, grade indeterminate. - Aortic valve: Moderately calcified annulus. Trileaflet.  Cath/PCI 10/24:  The left ventricular systolic function is normal.  LV end diastolic pressure is normal.  The left ventricular ejection fraction is 55-65% by visual estimate.  Ost LAD lesion, 30 %stenosed.  Ost Ramus to Ramus lesion, 40 %stenosed.  Prox LAD to  Mid LAD lesion, 50 %stenosed.  A drug eluting stent was successfully placed.  Mid LAD to Dist LAD lesion, 99 %stenosed. - PCI with resolute DES  Post intervention, there is a 0% residual stenosis.   1.  Severe one-vessel coronary artery disease with 99% stenosis in the mid to distal LAD. 2.5 X 26 mm Resolute Onyx stent 2.  Normal LV systolic function and normal left ventricular end-diastolic pressure. 3.  Successful angioplasty and drug-eluting stent placement to the mid/distal LAD.  A small septal branch was jailed by the stent with sluggish flow.  This resulted in chest pain and was treated with nitroglycerin drip          Patient Profile     61 y.o. male  with history of hypertension, HLD, obesity, former smoker, had a hernia and anxiety. He awakened at 1 AM 07/16/2017 with chest pressure described as severe and radiating to his back unrelieved with drinking water but usually takes away his hiatal hernia pain. The pain gradually went away after 1 1/2 hrs but returned and he went to Dr. Delanna Ahmadi office trying to save money but was sent to the [place] on the 24th. EKG sinus bradycardia with poor R-wave progression anteriorly and nonspecific ST-T wave changes. Seen in consultation by Dr. Cecil Cobbs. Symptoms concerning for unstable angina. He was brought down to [facility] where he underwent cardiac catheterization found to have severe mid LAD lesion treated with DES stent.  Assessment & Plan    Principal Problem:   Acute coronary syndrome (HCC) Active Problems:   Essential hypertension   Hyperlipidemia with target LDL less than 70   Chest pain   Coronary artery disease involving native coronary 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 (HCC)   Principal Problem:   Acute coronary syndrome [facility])--  Coronary artery disease involving native coronary 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 ([place])  Transferred from [facility] with unstable angina  and was found to have severe LAD lesion on cardiac catheterization treated with DES PCI. Plan DAPT for one year. (ASA, Brilinta)  He should have a prescription for sublingual nitroglycerin and instructions on when necessary usage.  Active Problems:   Essential hypertension - poorly controlled currently with multiple medications including amlodipine, losartan, HCTZ and metoprolol tartrate.   Based on his borderline bradycardicheart rates, I would not be a increased beta blocker dose, but we could switch to  carvedilol for better blood pressure response and less rate response. - Otherwise the only other option would be to consider hydralazine versus switching from Cozaar to a more potent ARB. -This can be managed in the outpatient setting.  Converted from metoprolol 25 twice a day to carvedilol 3.125 twice a day    Hyperlipidemia with target LDL less than 70 -> on Crestor 10 mg. I would increase to at least 20 if not 40 pending tolerance.  I increased the dose to 20 mg daily  Patient has multiple questions. I spent at least 25 minutes with him today simply discussing his medications and answering questions. This is in addition to the 10 minutes of chart review and 10 minutes of examination. total time with patient 45 minutes. > 50% of time was spent in direct patient consultation.  From a cardiac standpoint, he should be ready for discharge today. We will try to arrange close follow-up in our regional office. He asked about returning to work. - would prefer for him to wait until he is seen in follow-up.If this can be arranged for next week, he can return for follow-up visit.   For questions or updates, please contact [place] Please consult [email or web address] for contact info under Cardiology/STEMI.      Signed, Glenetta Hew, MD  07/18/2017, 11:05 AM

## 2017-07-18 NOTE — Discharge Summary (Signed)
Physician Discharge Summary  Stephen Graves OEU:[id number] DOB: [birth year] DOA: 07/16/2017  PCP: [facility]  Admit date: 07/16/2017 Discharge date: 07/18/2017  Admitted From: Home Disposition:  Home  Recommendations for Outpatient Follow-up:  1. Follow up with Cardiology in 1 week  Home Health: No Equipment/Devices: No  Discharge Condition: Good  CODE STATUS: FULL Diet recommendation: Low sodium, cardiac  Brief/Interim Summary: The patient is a 61 yo M with HTN, hyperlipidemia, obesity who presented with atypical chest pain that woke him from sleep on 07/16/17.  He was admitted to [facility] where he initially had weakly positive troponin.  Cardiology were consulted, diagnosed ACS, and recommended transfer to [facility] for cardiac catheterization.  This was completed yesterday, and showed severe 1V disease for which he was stented with DES.  He had an uneventful post-cath course, and today was cleared by Cardiology for discharge home.  Discharge Diagnoses:  Principal Problem:   Acute coronary syndrome ([place]) Active Problems:   Essential hypertension   Chest pain   Hyperlipidemia with target LDL less than 70   Coronary artery disease involving native coronary 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 [facility])    Discharge Instructions  Discharge Instructions    AMB Referral to Cardiac Rehabilitation - Phase II    Complete by:  As directed    Diagnosis:   NSTEMI Coronary Stents     Amb Referral to Cardiac Rehabilitation    Complete by:  As directed    Diagnosis:   Coronary Stents NSTEMI     Diet - low sodium heart healthy    Complete by:  As directed    Discharge instructions    Complete by:  As directed    From Dr. Loleta Books: Start taking Brilinta twice daily. Take aspirin 81 mg daily (baby aspirin) These are both blood thinners of sorts, that you must take for 1 year to keep your stent clear.  Continue your old blood pressure  medicines: amlodipine, losartan-HCTZ  STOP TAKING metoprolol Start instead carvedilol 3.125 mg one tablet twice a day (in the morning and at night) (Carvedilol is a similar medicine to metoprolol, better for blood pressure)  Start taking the new dose of your cholesterol medicine Crestor.  The new Crestor prescription and the carvedilol prescription were sent automatically to your pharmacy the [facility] in [place].  Call Dr. Court Joy office (Cardiology heart clinic) for an appointment next week.  His [place] office is: [phone]  His [place] office is: [phone]  Limit sexual activity for the next 3 weeks, then no restrictions.  Do not take nitro under the tongue and sildenafil/Viagra.   Increase activity slowly    Complete by:  As directed      Allergies as of 07/18/2017      Reactions   Lipitor [atorvastatin]    Pain in neck and legs after taking medication      Medication List    STOP taking these medications   meloxicam 15 MG tablet Commonly known as:  MOBIC   metoprolol tartrate 25 MG tablet Commonly known as:  LOPRESSOR     TAKE these medications   amLODipine 10 MG tablet Commonly known as:  NORVASC Take 10 mg by mouth daily.   aspirin 81 MG chewable tablet Chew 81 mg by mouth daily.   carvedilol 3.125 MG tablet Commonly known as:  COREG Take 1 tablet (3.125 mg total) by mouth 2 (two) times daily.   citalopram 40 MG tablet Commonly known as:  CELEXA  Take 40 mg by mouth daily.   fish oil-omega-3 fatty acids 1000 MG capsule Take 1 g by mouth daily.   losartan-hydrochlorothiazide 100-25 MG tablet Commonly known as:  HYZAAR Take 1 tablet by mouth daily.   potassium chloride SA 20 MEQ tablet Commonly known as:  K-DUR,KLOR-CON Take 20 mEq by mouth daily.   rosuvastatin 20 MG tablet Commonly known as:  CRESTOR Take 1 tablet (20 mg total) by mouth daily at 6 PM.   ticagrelor 90 MG Tabs tablet Commonly known as:  BRILINTA Take 1 tablet (90 mg total)  by mouth 2 (two) times daily.      Follow-up Information    Imogene Burn, PA-C Follow up on 07/24/2017.   Specialty:  Cardiology Why:  at 1:30pm for your follow up appt.  Contact information: [place] [id number] [phone]          Allergies  Allergen Reactions  . Lipitor [Atorvastatin]     Pain in neck and legs after taking medication    Consultations:  Cardiology   Procedures/Studies: Dg Chest 2 View  Result Date: 07/16/2017 CLINICAL DATA:  Chest pain. EXAM: CHEST  2 VIEW COMPARISON:  No recent prior. FINDINGS: Mediastinum and hilar structures normal. Nodular density noted over the right lung base most likely a nipple shadow. Repeat chest x-ray with nipple markers suggested. No pleural effusion or pneumothorax. Cardiomegaly with normal pulmonary vascularity. IMPRESSION: Nodular density noted over the right lung base, most likely a nipple shadow. Repeat chest x-ray with nipple markers suggested. No acute bony or joint abnormality identified. Electronically Signed   By: Marcello Moores  Register   On: 07/16/2017 10:36   LHC 07/17/2017  The left ventricular systolic function is normal.  LV end diastolic pressure is normal.  The left ventricular ejection fraction is 55-65% by visual estimate.  Ost LAD lesion, 30 %stenosed.  Ost Ramus to Ramus lesion, 40 %stenosed.  Prox LAD to Mid LAD lesion, 50 %stenosed.  A drug eluting stent was successfully placed.  Mid LAD to Dist LAD lesion, 99 %stenosed.  Post intervention, there is a 0% residual stenosis.   1.  Severe one-vessel coronary artery disease with 99% stenosis in the mid to distal LAD. 2.  Normal LV systolic function and normal left ventricular end-diastolic pressure. 3.  Successful angioplasty and drug-eluting stent placement to the mid/distal LAD.  A small septal branch was jailed by the stent with sluggish flow.  This resulted in chest pain and was treated with nitroglycerin  drip.  Recommendations: Dual antiplatelet therapy for at least one year.  Aggressive treatment of risk factors.  Continue nitroglycerin drip as needed for chest pain.    Echocardiogram 07/17/17 Study Conclusions  - Left ventricle: The cavity size was normal. Wall thickness was   increased in a pattern of mild LVH. Systolic function was normal.   The estimated ejection fraction was in the range of 60% to 65%.   Wall motion was normal; there were no regional wall motion   abnormalities. Diastolic dysfunction, grade indeterminate. - Aortic valve: Moderately calcified annulus. Trileaflet.   Subjective: Feeling well.  No chest pain, dyspnea.  Able to ambulate without fatigue.  Discharge Exam: Vitals:   07/18/17 0930 07/18/17 1204  BP:  (!) 184/85  Pulse: 68 (!) 55  Resp:  17  Temp:  98.3 F (36.8 C)  SpO2:  99%   Vitals:   07/18/17 0820 07/18/17 0830 07/18/17 0930 07/18/17 1204  BP: (!) 159/85 (!) 165/93  [name])  184/85  Pulse: (!) 57 (!) 122 68 (!) 55  Resp: (!) 21 (!) 21  17  Temp: 98.3 F (36.8 C)   98.3 F (36.8 C)  TempSrc: Oral   Oral  SpO2: 99%   99%  Weight:      Height:        General: Pt is alert, awake, not in acute distress Cardiovascular: RRR, S1/S2 +, no rubs, no gallops Respiratory: CTA bilaterally, no wheezing, no rhonchi Abdominal: Soft, NT, ND, bowel sounds + Extremities: no edema, no cyanosis    The results of significant diagnostics from this hospitalization (including imaging, microbiology, ancillary and laboratory) are listed below for reference.     Microbiology: No results found for this or any previous visit (from the past 240 hour(s)).   Labs: BNP (last 3 results) No results for input(s): BNP in the last 8760 hours. Basic Metabolic Panel:  Recent Labs Lab 07/16/17 1018 07/18/17 0423  NA 138 137  K 4.2 3.6  CL 101 101  CO2 28 27  GLUCOSE 109* 103*  BUN 21* 15  CREATININE 1.17 1.18  CALCIUM 9.2 9.1   Liver Function  Tests: No results for input(s): AST, ALT, ALKPHOS, BILITOT, PROT, ALBUMIN in the last 168 hours. No results for input(s): LIPASE, AMYLASE in the last 168 hours. No results for input(s): AMMONIA in the last 168 hours. CBC:  Recent Labs Lab 07/16/17 1018 07/18/17 0423  WBC 5.9 5.6  HGB 14.6 14.3  HCT 43.4 41.9  MCV 95.8 93.9  PLT 135* 137*   Cardiac Enzymes:  Recent Labs Lab 07/16/17 1018 07/16/17 1750 07/16/17 2040  TROPONINI 0.06* 0.05* 0.04*   BNP: Invalid input(s): POCBNP CBG: No results for input(s): GLUCAP in the last 168 hours. D-Dimer No results for input(s): DDIMER in the last 72 hours. Hgb A1c No results for input(s): HGBA1C in the last 72 hours. Lipid Profile  Recent Labs  07/18/17 0423  CHOL 198  HDL 33*  LDLCALC 134*  TRIG 153*  CHOLHDL 6.0   Thyroid function studies No results for input(s): TSH, T4TOTAL, T3FREE, THYROIDAB in the last 72 hours.  Invalid input(s): FREET3 Anemia work up No results for input(s): VITAMINB12, FOLATE, FERRITIN, TIBC, IRON, RETICCTPCT in the last 72 hours. Urinalysis No results found for: COLORURINE, APPEARANCEUR, LABSPEC, [place], GLUCOSEU, HGBUR, BILIRUBINUR, KETONESUR, PROTEINUR, UROBILINOGEN, NITRITE, LEUKOCYTESUR Sepsis Labs Invalid input(s): PROCALCITONIN,  WBC,  LACTICIDVEN Microbiology No results found for this or any previous visit (from the past 240 hour(s)).   Time coordinating discharge: Over 30 minutes  SIGNED:   Edwin Dada, MD  [facility] 07/18/2017, 1:37 PM   If 7PM-7AM, please contact night-coverage [email or web address] Password [email or web address]

## 2017-07-18 NOTE — Care Management Note (Addendum)
Case Management Note  Patient Details  Name: NEAL TRULSON MRN: [id number] Date of Birth: 1956/04/18  Subjective/Objective:   From home alone, s/p coronary stent intervention, will be on brilinta, NCM awaiting benefit check for brilinta.    10/25 [place], BSN - [place] M awaiting benefit check, patient is concerned he may not be able to afford.    Co pay is 30.00 for 30 day supply, NCM gave patient the $5 co pay card for each month for the brilinta.  He goes to [facility] in [place] and they do have in stock.                             Action/Plan: NCM awaiting benefit check.  Expected Discharge Date:                  Expected Discharge Plan:  Home/Self Care  In-House Referral:     Discharge planning Services  CM Consult  Post Acute Care Choice:    Choice offered to:     DME Arranged:    DME Agency:     HH Arranged:    [place] Agency:     Status of Service:  Completed, signed off  If discussed at [employer] of Stay Meetings, dates discussed:    Additional Comments:  Zenon Mayo, RN 07/18/2017, 9:31 AM

## 2017-07-23 NOTE — Progress Notes (Signed)
Cardiology Office Note    Date:  07/24/2017   ID:  Stephen Graves, DOB [birth year], MRN [id number]  PCP:  [facility]  Cardiologist: Dr. Bronson Ing  Chief Complaint  Patient presents with  . Follow-up    History of Present Illness:  Stephen Graves is a 62 y.o. male with history of hypertension, HLD, obesity, former smoker, anxiety who was admitted to the hospital with unstable angina and underwent DES to the LAD 07/17/17.  Patient has borderline bradycardia so beta-blocker could not be increased.  Blood pressure was poorly controlled and discussion of adding hydralazine versus switching from Cozaar to a more potent ARB should be considered.  Patient comes in today with multiple questions. Very anxious about his recent diagnosis and going back to work. Riding exercise bike 15 min twice daily.  No further chest pain.  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    Past Medical History:  Diagnosis Date  . Anxiety   . Coronary artery disease involving native coronary 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 ([place]) 07/18/2017   Cardiac cath 1020 1418: Mid LAD to Dist LAD lesion, 99 %stenosed. - PCI with resolute DES (2.5 X 26 mm Resolute Onyx stent - 2.75 mm);ostial LAD 30%. Ostial ramus 40%. Proximal to mid LAD 50%. Normal LV function and normal LVEDP.   [name] Hiatal hernia   . High cholesterol   . HTN (hypertension)   . Skull fracture [facility])     Past Surgical History:  Procedure Laterality Date  . CORONARY STENT INTERVENTION N/A 07/17/2017   Procedure: CORONARY STENT INTERVENTION;  Surgeon: Wellington Hampshire, MD;  Location: [place] [facility];  Service: Cardiovascular;  Laterality: N/A;  . FOOT SURGERY Left   . LEFT HEART CATH AND CORONARY ANGIOGRAPHY N/A 07/17/2017   Procedure: LEFT HEART CATH AND CORONARY ANGIOGRAPHY;  Surgeon: Wellington Hampshire, MD;  Location: [place] [facility];  Service: Cardiovascular;  Laterality: N/A;  .  left wrist surgery    . SKIN GRAFT  1998    Current Medications: Current Meds  Medication Sig  . amLODipine (NORVASC) 10 MG tablet Take 10 mg by mouth daily.  [name] aspirin 81 MG chewable tablet Chew 81 mg by mouth daily.  . carvedilol (COREG) 3.125 MG tablet Take 1 tablet (3.125 mg total) by mouth 2 (two) times daily.  . citalopram (CELEXA) 40 MG tablet Take 40 mg by mouth daily.   . fish oil-omega-3 fatty acids 1000 MG capsule Take 1 g by mouth daily.  [name] losartan-hydrochlorothiazide (HYZAAR) 100-25 MG tablet Take 1 tablet by mouth daily.   . potassium chloride SA (K-DUR,KLOR-CON) 20 MEQ tablet Take 20 mEq by mouth daily.    . rosuvastatin (CRESTOR) 20 MG tablet Take 1 tablet (20 mg total) by mouth daily at 6 PM.  . ticagrelor (BRILINTA) 90 MG TABS tablet Take 1 tablet (90 mg total) by mouth 2 (two) times daily.     Allergies:   Lipitor [atorvastatin]   Social History   Social History  . Marital status: Divorced    Spouse name: N/A  . Number of children: N/A  . Years of education: N/A   Social History Main Topics  . Smoking status: Former Smoker    Types: Cigarettes    Quit date: 09/24/1988  . Smokeless tobacco: Never Used     Comment: tobacco use - no  . Alcohol use No  . Drug use: No  .  Sexual activity: Not Asked   Other Topics Concern  . None   Social History Narrative   Full time; single.      Family History:  The patient's family history includes CAD in his mother; Heart attack (age of onset: 4) in his father; Prostate cancer in his brother; Stroke (age of onset: 66) in his father; Thyroid nodules in his brother.   ROS:   Please see the history of present illness.    Review of Systems  Constitution: Negative.  HENT: Negative.   Cardiovascular: Negative.   Respiratory: Negative.   Endocrine: Negative.   Hematologic/Lymphatic: Negative.   Musculoskeletal: Positive for arthritis and back pain.  Gastrointestinal: Positive for heartburn.  Genitourinary: Negative.    Neurological: Negative.    All other systems reviewed and are negative.   PHYSICAL EXAM:   VS:  Ht 5\' 11"  (1.803 m)   Wt 233 lb (105.7 kg)   BMI 32.50 kg/m   Physical Exam  GEN: Well nourished, well developed, in no acute distress  Neck: no JVD, carotid bruits, or masses Cardiac:RRR; no murmurs, rubs, or gallops  Respiratory:  clear to auscultation bilaterally, normal work of breathing GI: soft, nontender, nondistended, + BS Ext: Right arm at cath site with bruising but no hematoma or hemorrhage, good radial brachial pulses, lower extremities without cyanosis, clubbing, or edema, Good distal pulses bilaterally Neuro:  Alert and Oriented x 3 Psych: euthymic mood, full affect  Wt Readings from Last 3 Encounters:  07/24/17 233 lb (105.7 kg)  07/18/17 239 lb 9.6 oz (108.7 kg)  02/28/10 (!) 261 lb (118.4 kg)      Studies/Labs Reviewed:   EKG:  EKG is not ordered today.  Recent Labs: 07/18/2017: BUN 15; Creatinine, Ser 1.18; Hemoglobin 14.3; Platelets 137; Potassium 3.6; Sodium 137   Lipid Panel    Component Value Date/Time   CHOL 198 07/18/2017 0423   TRIG 153 (H) 07/18/2017 0423   HDL 33 (L) 07/18/2017 0423   CHOLHDL 6.0 07/18/2017 0423   VLDL 31 07/18/2017 0423   LDLCALC 134 (H) 07/18/2017 0423    Additional studies/ records that were reviewed today include:    Echo 07/17/17:- Left ventricle: The cavity size was normal. Wall thickness was   increased in a pattern of mild LVH. Systolic function was normal.   The estimated ejection fraction was in the range of 60% to 65%.   Wall motion was normal; there were no regional wall motion   abnormalities. Diastolic dysfunction, grade indeterminate. - Aortic valve: Moderately calcified annulus. Trileaflet.   Cath/PCI 07/17/17:  The left ventricular systolic function is normal.  LV end diastolic pressure is normal.  The left ventricular ejection fraction is 55-65% by visual estimate.  Ost LAD lesion, 30  %stenosed.  Ost Ramus to Ramus lesion, 40 %stenosed.  Prox LAD to Mid LAD lesion, 50 %stenosed.  A drug eluting stent was successfully placed.  Mid LAD to Dist LAD lesion, 99 %stenosed. - PCI with resolute DES  Post intervention, there is a 0% residual stenosis.   1.  Severe one-vessel coronary artery disease with 99% stenosis in the mid to distal LAD. 2.5 X 26 mm Resolute Onyx stent 2.  Normal LV systolic function and normal left ventricular end-diastolic pressure. 3.  Successful angioplasty and drug-eluting stent placement to the mid/distal LAD.  A small septal branch was jailed by the stent with sluggish flow.  This resulted in chest pain and was treated with nitroglycerin drip  ASSESSMENT:    1. Coronary artery disease involving native coronary artery of native heart without angina pectoris   2. Essential hypertension   3. Hyperlipidemia, unspecified hyperlipidemia type   4. Anxiety state      PLAN:  In order of problems listed above:  CAD status post DES of the severe LAD 07/17/17 sent home on aspirin and Brilinta.  Doing well without angina.  Increase physical activity.  May return to work next week light duty followed by full duty the following week.  Essential hypertension poorly controlled in the hospital has bradycardia beta-blocker was not increased and converted from metoprolol to carvedilol 3.125 mg twice daily.  Heart rate doing better but blood pressure is on the low side.  This morning he said it was 140/80 before he took his medicines and then dropped to 106/69.  He has had some dizziness.  Blood pressure is 94/58 today.  We will decrease his amlodipine to 5 mg daily.  He will call if his blood pressure starts to trend up.  HLD Crestor increased to 20 mg daily.  Patient having some neck pain and wondering if it is coming from the Crestor.  Will hold off on titrating it up further until he comes back for follow-up.   Medication Adjustments/Labs and Tests  Ordered: Current medicines are reviewed at length with the patient today.  Concerns regarding medicines are outlined above.  Medication changes, Labs and Tests ordered today are listed in the Patient Instructions below. There are no Patient Instructions on file for this visit.   Signed, Ermalinda Barrios, PA-C  07/24/2017 1:11 PM    [facility] [place], [place], [place]  [address] Phone: [phone]; Fax: [phone]

## 2017-07-24 ENCOUNTER — Encounter: Payer: Self-pay | Admitting: Physician Assistant

## 2017-07-24 ENCOUNTER — Encounter: Payer: Self-pay | Admitting: *Deleted

## 2017-07-24 ENCOUNTER — Ambulatory Visit (INDEPENDENT_AMBULATORY_CARE_PROVIDER_SITE_OTHER): Payer: BC Managed Care – PPO | Admitting: Physician Assistant

## 2017-07-24 VITALS — BP 94/58 | HR 72 | Ht 71.0 in | Wt 233.0 lb

## 2017-07-24 DIAGNOSIS — I10 Essential (primary) hypertension: Secondary | ICD-10-CM

## 2017-07-24 DIAGNOSIS — I2510 Atherosclerotic heart disease of native coronary artery without angina pectoris: Secondary | ICD-10-CM | POA: Diagnosis not present

## 2017-07-24 DIAGNOSIS — E785 Hyperlipidemia, unspecified: Secondary | ICD-10-CM | POA: Diagnosis not present

## 2017-07-24 DIAGNOSIS — F411 Generalized anxiety disorder: Secondary | ICD-10-CM

## 2017-07-24 MED ORDER — AMLODIPINE BESYLATE 5 MG PO TABS: 5.0000 mg | ORAL_TABLET | Freq: Every day | ORAL | 11 refills | Status: DC

## 2017-07-24 MED ORDER — NITROGLYCERIN 0.4 MG SL SUBL: 0.4000 mg | SUBLINGUAL_TABLET | SUBLINGUAL | 3 refills | Status: DC | PRN

## 2017-07-24 NOTE — Patient Instructions (Signed)
Medication Instructions:  Your physician has recommended you make the following change in your medication:  Start Nitro  Decrease Norvasc to 5 mg Daily    Labwork: NONE   Testing/Procedures: NONE   Follow-Up: Your physician recommends that you schedule a follow-up appointment in: 6 Weeks with Dr. Amedeo Kinsman    Any Other Special Instructions Will Be Listed Below (If Applicable).  You have been given a note for work.    If you need a refill on your cardiac medications before your next appointment, please call your pharmacy.  Thank you for choosing [facility]!

## 2017-07-26 ENCOUNTER — Telehealth: Payer: Self-pay | Admitting: Physician Assistant

## 2017-07-26 NOTE — Telephone Encounter (Signed)
Pt was seen by PCP on yesterday. BP at first check was 86/53 and 101/69 on recheck. At home pt's BP was 106/69 with first check and 103/69 with second check. Pt reports that he is currently taking reduced dose of Norvasc. Pt does not report dizziness at this time. Will forward to DOD

## 2017-07-26 NOTE — Telephone Encounter (Signed)
Patient states that one hour after taking BP medicine his BP is running low. Patient did not know the name of the medicine. / tg

## 2017-07-26 NOTE — Telephone Encounter (Signed)
May be too much all at once to take. Try taking lower dose of norvasc at night, and update [place] on Monday with bp's   Zandra Abts MD

## 2017-07-29 NOTE — Telephone Encounter (Signed)
Patient states he takes amlodipine 5 mg at nite, says his BP is running normal now

## 2017-08-01 ENCOUNTER — Telehealth: Payer: Self-pay | Admitting: Physician Assistant

## 2017-08-01 NOTE — Telephone Encounter (Signed)
Patient has questions regarding diet restrictions. / tg

## 2017-08-01 NOTE — Telephone Encounter (Signed)
Patient was concerned  Over eating dark greens.I reassured him it was ok because he is not on Coumadin.He has lst 14 lbs since hospital admission.His Norvasc was decreased to 5 mg daily and he feels much better.BP 105/73., HR 62

## 2017-08-02 ENCOUNTER — Telehealth: Payer: Self-pay | Admitting: Cardiovascular Disease

## 2017-08-02 NOTE — Telephone Encounter (Signed)
Returned pt. Call, he informed me that he had ate some vegetable soup for lunch today and that he thinks I gave him indigestion. He was having some discomfort in his back, and a funny feeling in his chest. He asked his boss if he could sit down and rest for a few minutes and she said it was fine. He stated he sat down and drank some water and was fine. He did not complain of any SOB, no CP ( only a funny feeling) . He stated he does have a lot of anxiety right now, and he thinks he overreacts with any pain he has now. He stated he will call back if he has anymore problems.

## 2017-08-02 NOTE — Telephone Encounter (Signed)
Please give pt a call--states he's having a funny feeling in his chest

## 2017-08-09 ENCOUNTER — Telehealth: Payer: Self-pay | Admitting: Adult Health

## 2017-08-09 NOTE — Telephone Encounter (Signed)
Patient has c/o of light headedness after walking and bending over. Wants to know if this is normal. / tg

## 2017-08-09 NOTE — Telephone Encounter (Signed)
I advised patient to not bend over, but to squat.He says he has lost 16 lbs with exercise. I congratulated him on his efforts

## 2017-08-22 ENCOUNTER — Ambulatory Visit (HOSPITAL_COMMUNITY)
Admission: RE | Admit: 2017-08-22 | Discharge: 2017-08-22 | Disposition: A | Discharge status: Home / Self Care | Payer: BC Managed Care – PPO | Source: Ambulatory Visit | Attending: Physician Assistant | Admitting: Physician Assistant

## 2017-08-22 ENCOUNTER — Other Ambulatory Visit (HOSPITAL_COMMUNITY): Payer: Self-pay | Admitting: Physician Assistant

## 2017-08-22 DIAGNOSIS — M25462 Effusion, left knee: Secondary | ICD-10-CM | POA: Diagnosis not present

## 2017-08-22 DIAGNOSIS — M1711 Unilateral primary osteoarthritis, right knee: Secondary | ICD-10-CM

## 2017-08-22 DIAGNOSIS — M170 Bilateral primary osteoarthritis of knee: Secondary | ICD-10-CM | POA: Diagnosis not present

## 2017-08-22 DIAGNOSIS — M1712 Unilateral primary osteoarthritis, left knee: Secondary | ICD-10-CM | POA: Diagnosis present

## 2017-09-04 ENCOUNTER — Ambulatory Visit (INDEPENDENT_AMBULATORY_CARE_PROVIDER_SITE_OTHER): Payer: BC Managed Care – PPO | Admitting: Cardiovascular Disease

## 2017-09-04 ENCOUNTER — Encounter: Payer: Self-pay | Admitting: Cardiovascular Disease

## 2017-09-04 VITALS — BP 98/60 | HR 62 | Ht 71.0 in | Wt 218.0 lb

## 2017-09-04 DIAGNOSIS — Z955 Presence of coronary angioplasty implant and graft: Secondary | ICD-10-CM

## 2017-09-04 DIAGNOSIS — I10 Essential (primary) hypertension: Secondary | ICD-10-CM | POA: Diagnosis not present

## 2017-09-04 DIAGNOSIS — I952 Hypotension due to drugs: Secondary | ICD-10-CM

## 2017-09-04 DIAGNOSIS — Z79899 Other long term (current) drug therapy: Secondary | ICD-10-CM | POA: Diagnosis not present

## 2017-09-04 DIAGNOSIS — I25118 Atherosclerotic heart disease of native coronary artery with other forms of angina pectoris: Secondary | ICD-10-CM

## 2017-09-04 DIAGNOSIS — E785 Hyperlipidemia, unspecified: Secondary | ICD-10-CM

## 2017-09-04 NOTE — Patient Instructions (Signed)
Medication Instructions:  STOP NORVASC   Labwork: 2 MONTHS  FASTING LIPID   Testing/Procedures: NONE  Follow-Up: Your physician wants you to follow-up in: 6 MONTHS .  You will receive a reminder letter in the mail two months in advance. If you don't receive a letter, please call our office to schedule the follow-up appointment.   Any Other Special Instructions Will Be Listed Below (If Applicable).     If you need a refill on your cardiac medications before your next appointment, please call your pharmacy.

## 2017-09-04 NOTE — Progress Notes (Signed)
SUBJECTIVE: The patient presents for routine follow-up.  He underwent coronary angiography on 07/17/17 and was found to have severe 1 vessel coronary artery disease with 99% stenosis in the mid to distal L for which he underwent successful angioplasty and drug-eluting stent placement.  A small septal branch was jailed by the stent was sluggish flow.  This resulted in chest pain and was treated with nitroglycerin drip at the time of the procedure. It was recommended he remain on dual antiplatelet therapy for at least one year.  Echocardiogram on 07/17/17 demonstrated normal left ventricular systolic function and regional wall motion, LVEF 60-65%.  He is doing well today.  He has a history of anxiety and has several questions about his health.  His blood pressure today is 98/60 and was 83/56 about 1 or 2 weeks ago.  He seldom has lightheadedness and dizziness when bending.  He has lost about 26 pounds since the end of October.  He is a Sports coach by profession.  He has stopped eating junk food.  He denies chest pain, leg swelling, shortness of breath.    Review of Systems: As per "subjective", otherwise negative.  Allergies  Allergen Reactions  . Lipitor [Atorvastatin]     Pain in neck and legs after taking medication    Current Outpatient Medications  Medication Sig Dispense Refill  . amLODipine (NORVASC) 5 MG tablet Take 1 tablet (5 mg total) by mouth daily. 30 tablet 11  . aspirin 81 MG chewable tablet Chew 81 mg by mouth daily.    . carvedilol (COREG) 3.125 MG tablet Take 1 tablet (3.125 mg total) by mouth 2 (two) times daily. 60 tablet 11  . citalopram (CELEXA) 40 MG tablet Take 40 mg by mouth daily.     . fish oil-omega-3 fatty acids 1000 MG capsule Take 1 g by mouth daily.    [name] losartan-hydrochlorothiazide (HYZAAR) 100-25 MG tablet Take 0.5 tablets by mouth daily.     . nitroGLYCERIN (NITROSTAT) 0.4 MG SL tablet Place 1 tablet (0.4 mg total) under the tongue every 5 (five)  minutes as needed for chest pain. 25 tablet 3  . potassium chloride SA (K-DUR,KLOR-CON) 20 MEQ tablet Take 20 mEq by mouth daily.      . rosuvastatin (CRESTOR) 20 MG tablet Take 1 tablet (20 mg total) by mouth daily at 6 PM. 30 tablet 3  . ticagrelor (BRILINTA) 90 MG TABS tablet Take 1 tablet (90 mg total) by mouth 2 (two) times daily. 60 tablet 3   No current facility-administered medications for this visit.     Past Medical History:  Diagnosis Date  . Anxiety   . Coronary artery disease involving native coronary 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 ([place]) 07/18/2017   Cardiac cath 1020 1418: Mid LAD to Dist LAD lesion, 99 %stenosed. - PCI with resolute DES (2.5 X 26 mm Resolute Onyx stent - 2.75 mm);ostial LAD 30%. Ostial ramus 40%. Proximal to mid LAD 50%. Normal LV function and normal LVEDP.   [name] Hiatal hernia   . High cholesterol   . HTN (hypertension)   . Skull fracture [facility])     Past Surgical History:  Procedure Laterality Date  . CORONARY STENT INTERVENTION N/A 07/17/2017   Procedure: CORONARY STENT INTERVENTION;  Surgeon: Wellington Hampshire, MD;  Location: [place] [facility];  Service: Cardiovascular;  Laterality: N/A;  . FOOT SURGERY Left   . LEFT HEART CATH AND CORONARY ANGIOGRAPHY N/A 07/17/2017   Procedure:  LEFT HEART CATH AND CORONARY ANGIOGRAPHY;  Surgeon: Wellington Hampshire, MD;  Location: [place] [facility];  Service: Cardiovascular;  Laterality: N/A;  . left wrist surgery    . SKIN GRAFT  1998    Social History   Socioeconomic History  . Marital status: Divorced    Spouse name: Not on file  . Number of children: Not on file  . Years of education: Not on file  . Highest education level: Not on file  Social Needs  . Financial resource strain: Not on file  . Food insecurity - worry: Not on file  . Food insecurity - inability: Not on file  . Transportation needs - medical: Not on file  . Transportation needs - non-medical: Not on file    Occupational History  . Not on file  Tobacco Use  . Smoking status: Former Smoker    Types: Cigarettes    Last attempt to quit: 09/24/1988    Years since quitting: 28.9  . Smokeless tobacco: Never Used  . Tobacco comment: tobacco use - no  Substance and Sexual Activity  . Alcohol use: No  . Drug use: No  . Sexual activity: Not on file  Other Topics Concern  . Not on file  Social History Narrative   Full time; single.      Vitals:   09/04/17 1328  BP: 98/60  Pulse: 62  SpO2: 98%  Weight: 218 lb (98.9 kg)  Height: 5\' 11"  (1.803 m)    Wt Readings from Last 3 Encounters:  09/04/17 218 lb (98.9 kg)  07/24/17 233 lb (105.7 kg)  07/18/17 239 lb 9.6 oz (108.7 kg)     PHYSICAL EXAM General: NAD HEENT: Normal. Neck: No JVD, no thyromegaly. Lungs: Clear to auscultation bilaterally with normal respiratory effort. CV: Regular rate and rhythm, normal S1/S2, no S3/S4, no murmur. No pretibial or periankle edema. Abdomen: Soft, nontender, no distention.  Neurologic: Alert and oriented.  Psych: Normal affect. Skin: Normal. Musculoskeletal: No gross deformities.    ECG: Most recent ECG reviewed.   Labs: Lab Results  Component Value Date/Time   K 3.6 07/18/2017 04:23 AM   BUN 15 07/18/2017 04:23 AM   CREATININE 1.18 07/18/2017 04:23 AM   HGB 14.3 07/18/2017 04:23 AM     Lipids: Lab Results  Component Value Date/Time   LDLCALC 134 (H) 07/18/2017 04:23 AM   CHOL 198 07/18/2017 04:23 AM   TRIG 153 (H) 07/18/2017 04:23 AM   HDL 33 (L) 07/18/2017 04:23 AM       ASSESSMENT AND PLAN: 1.  Coronary artery disease status post drug-eluting stent placement to the mid to distal LAD: Symptomatically stable.  Continue dual antiplatelet therapy for 1 year with aspirin and Brilinta.  Continue carvedilol and Crestor.  2.  Chronic hypertension: Blood pressure is low normal.  He has had some dizziness.  I will stop amlodipine.  I suspect blood pressure has become easier to control  since he has lost 26 pounds.  3.  Hyperlipidemia: Continue statin therapy with Crestor 20 mg.  LDL 134 on 07/18/17.  I will repeat lipids in 2 months.   Disposition: Follow up 6 months   Kate Sable, M.D., F.A.C.C.

## 2017-09-27 ENCOUNTER — Telehealth: Payer: Self-pay

## 2017-09-27 NOTE — Telephone Encounter (Signed)
Forgot am meds and went to work this am.Says he cannot go home at lunch and get meds.He will take evening meds when he gets home at 5 pm

## 2017-10-21 ENCOUNTER — Telehealth: Payer: Self-pay | Admitting: Cardiovascular Disease

## 2017-10-21 NOTE — Telephone Encounter (Signed)
Patient would like to speak with nurse. He wants to know if he can take tylenol or advil for pain. / tg

## 2017-10-21 NOTE — Telephone Encounter (Signed)
Patient has chronic knee pain from arthritis and cannot have cortisone shot until February.Recommended he use Tylenol vs NSAID for pain

## 2017-11-04 ENCOUNTER — Other Ambulatory Visit: Payer: Self-pay | Admitting: *Deleted

## 2017-11-04 MED ORDER — ROSUVASTATIN CALCIUM 20 MG PO TABS: 20.0000 mg | ORAL_TABLET | Freq: Every day | ORAL | 3 refills | Status: DC

## 2017-11-04 MED ORDER — TICAGRELOR 90 MG PO TABS: 90.0000 mg | ORAL_TABLET | Freq: Two times a day (BID) | ORAL | 3 refills | Status: DC

## 2017-11-05 ENCOUNTER — Other Ambulatory Visit (HOSPITAL_COMMUNITY)
Admission: RE | Admit: 2017-11-05 | Discharge: 2017-11-05 | Disposition: A | Discharge status: Home / Self Care | Payer: BC Managed Care – PPO | Source: Ambulatory Visit | Attending: Cardiovascular Disease | Admitting: Cardiovascular Disease

## 2017-11-05 DIAGNOSIS — Z79899 Other long term (current) drug therapy: Secondary | ICD-10-CM | POA: Diagnosis not present

## 2017-11-05 LAB — LIPID PANEL
Cholesterol: 111 mg/dL (ref 0–200)
HDL: 43 mg/dL (ref >40)
LDL Cholesterol: 46 mg/dL (ref 0–99)
TRIGLYCERIDES: 112 mg/dL (ref <150)
Total CHOL/HDL Ratio: 2.6 RATIO
VLDL: 22 mg/dL (ref 0–40)

## 2018-02-11 ENCOUNTER — Telehealth: Payer: Self-pay | Admitting: Cardiovascular Disease

## 2018-02-11 NOTE — Telephone Encounter (Signed)
Patient states that he had chest pain this morning that was relieved with NTG. Wants to know if he needs to be seen. / tg

## 2018-02-11 NOTE — Telephone Encounter (Signed)
Sitting in court for jury duty, had brief episode of CP.Took NTG, states almost immediately pain was gone. He voiced understanding that he could use up to three NTG , 5 minutes a part and if pain is unresolved to go to the ED.He will track anginal pattern and call back if sx's become recurrent

## 2018-02-18 ENCOUNTER — Ambulatory Visit: Payer: Self-pay | Admitting: Orthopedic Surgery

## 2018-03-06 ENCOUNTER — Encounter: Payer: Self-pay | Admitting: Cardiovascular Disease

## 2018-03-06 ENCOUNTER — Ambulatory Visit: Payer: BC Managed Care – PPO | Admitting: Cardiovascular Disease

## 2018-03-06 VITALS — BP 150/100 | HR 60 | Ht 71.0 in | Wt 206.4 lb

## 2018-03-06 DIAGNOSIS — I10 Essential (primary) hypertension: Secondary | ICD-10-CM

## 2018-03-06 DIAGNOSIS — E785 Hyperlipidemia, unspecified: Secondary | ICD-10-CM | POA: Diagnosis not present

## 2018-03-06 DIAGNOSIS — Z955 Presence of coronary angioplasty implant and graft: Secondary | ICD-10-CM

## 2018-03-06 DIAGNOSIS — I25118 Atherosclerotic heart disease of native coronary artery with other forms of angina pectoris: Secondary | ICD-10-CM

## 2018-03-06 NOTE — Patient Instructions (Signed)
Medication Instructions:  Your physician has recommended you make the following change in your medication:  STOP Taking Brilinta on November 1   Labwork: NONE   Testing/Procedures: NONE   Follow-Up: Your physician wants you to follow-up in: 6 Months with Dr. Bronson Ing.  You will receive a reminder letter in the mail two months in advance. If you don't receive a letter, please call our office to schedule the follow-up appointment.   Any Other Special Instructions Will Be Listed Below (If Applicable).     If you need a refill on your cardiac medications before your next appointment, please call your pharmacy.  Thank you for choosing [facility]!

## 2018-03-06 NOTE — Progress Notes (Signed)
SUBJECTIVE: Patient presents for follow-up of coronary disease.  He called our office on 02/11/2018 explaining that he was sitting in court for jury duty and had a brief episode of chest pain for which he took one sublingual nitroglycerin.  He underwent coronary angiography on 07/17/17 and was found to have severe 1 vessel coronary artery disease with 99% stenosis in the mid to distal LAD for which he underwent successful angioplasty and drug-eluting stent placement.  A small septal branch was jailed by the stent with sluggish flow.  This resulted in chest pain and was treated with a nitroglycerin drip at the time of the procedure. It was recommended he remain on dual antiplatelet therapy for at least one year.  Echocardiogram on 07/17/17 demonstrated normal left ventricular systolic function and regional wall motion, LVEF 60-65%.  He has not had any further episodes of chest pain and is not entirely sure if the episode he had while in jury duty was due to his heart.  He said it was nothing like what he experienced back in October 2018 at the time of stent placement.  He exercises on a recumbent bicycle for 20 to 30 minutes 5 times per week without any exertional chest pain or shortness of breath.  He has had some left knee pain and is going to receive an injection tomorrow but wants to begin walking more.  He is lost about 45 to 50 pounds total.  He checks his blood pressure routinely at home and it runs in the 120-130/78-82 range.   Social history: He is a Sports coach by profession.   Review of Systems: As per "subjective", otherwise negative.  Allergies  Allergen Reactions  . Lipitor [Atorvastatin]     Pain in neck and legs after taking medication    Current Outpatient Medications  Medication Sig Dispense Refill  . aspirin 81 MG chewable tablet Chew 81 mg by mouth daily.    . carvedilol (COREG) 3.125 MG tablet Take 1 tablet (3.125 mg total) by mouth 2 (two) times daily. 60 tablet  11  . citalopram (CELEXA) 40 MG tablet Take 40 mg by mouth daily.     . fish oil-omega-3 fatty acids 1000 MG capsule Take 1 g by mouth daily.    [name] losartan-hydrochlorothiazide (HYZAAR) 100-25 MG tablet Take 0.5 tablets by mouth daily.     . nitroGLYCERIN (NITROSTAT) 0.4 MG SL tablet Place 1 tablet (0.4 mg total) under the tongue every 5 (five) minutes as needed for chest pain. 25 tablet 3  . potassium chloride SA (K-DUR,KLOR-CON) 20 MEQ tablet Take 20 mEq by mouth daily.      . rosuvastatin (CRESTOR) 20 MG tablet Take 1 tablet (20 mg total) by mouth daily at 6 PM. 90 tablet 3  . ticagrelor (BRILINTA) 90 MG TABS tablet Take 1 tablet (90 mg total) by mouth 2 (two) times daily. 180 tablet 3   No current facility-administered medications for this visit.     Past Medical History:  Diagnosis Date  . Anxiety   . Coronary artery disease involving native coronary 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 ([place]) 07/18/2017   Cardiac cath 1020 1418: Mid LAD to Dist LAD lesion, 99 %stenosed. - PCI with resolute DES (2.5 X 26 mm Resolute Onyx stent - 2.75 mm);ostial LAD 30%. Ostial ramus 40%. Proximal to mid LAD 50%. Normal LV function and normal LVEDP.   [name] Hiatal hernia   . High cholesterol   . HTN (hypertension)   .  Skull fracture [facility])     Past Surgical History:  Procedure Laterality Date  . CORONARY STENT INTERVENTION N/A 07/17/2017   Procedure: CORONARY STENT INTERVENTION;  Surgeon: Wellington Hampshire, MD;  Location: [facility];  Service: Cardiovascular;  Laterality: N/A;  . FOOT SURGERY Left   . LEFT HEART CATH AND CORONARY ANGIOGRAPHY N/A 07/17/2017   Procedure: LEFT HEART CATH AND CORONARY ANGIOGRAPHY;  Surgeon: Wellington Hampshire, MD;  Location: [place] [facility];  Service: Cardiovascular;  Laterality: N/A;  . left wrist surgery    . SKIN GRAFT  1998    Social History   Socioeconomic History  . Marital status: Divorced    Spouse name: Not on file  . Number of  children: Not on file  . Years of education: Not on file  . Highest education level: Not on file  Occupational History  . Not on file  Social Needs  . Financial resource strain: Not on file  . Food insecurity:    Worry: Not on file    Inability: Not on file  . Transportation needs:    Medical: Not on file    Non-medical: Not on file  Tobacco Use  . Smoking status: Former Smoker    Types: Cigarettes    Last attempt to quit: 09/24/1988    Years since quitting: 29.4  . Smokeless tobacco: Never Used  . Tobacco comment: tobacco use - no  Substance and Sexual Activity  . Alcohol use: No  . Drug use: No  . Sexual activity: Not on file  Lifestyle  . Physical activity:    Days per week: Not on file    Minutes per session: Not on file  . Stress: Not on file  Relationships  . Social connections:    Talks on phone: Not on file    Gets together: Not on file    Attends religious service: Not on file    Active member of club or organization: Not on file    Attends meetings of clubs or organizations: Not on file    Relationship status: Not on file  . Intimate partner violence:    Fear of current or ex partner: Not on file    Emotionally abused: Not on file    Physically abused: Not on file    Forced sexual activity: Not on file  Other Topics Concern  . Not on file  Social History Narrative   Full time; single.      Vitals:   03/06/18 1308  BP: (!) 150/100  Pulse: 60  SpO2: 99%  Weight: 206 lb 6.4 oz (93.6 kg)  Height: 5\' 11"  (1.803 m)    Wt Readings from Last 3 Encounters:  03/06/18 206 lb 6.4 oz (93.6 kg)  09/04/17 218 lb (98.9 kg)  07/24/17 233 lb (105.7 kg)     PHYSICAL EXAM General: NAD HEENT: Normal. Neck: No JVD, no thyromegaly. Lungs: Clear to auscultation bilaterally with normal respiratory effort. CV: Regular rate and rhythm, normal S1/S2, no S3/S4, no murmur. No pretibial or periankle edema.  No carotid bruit.   Abdomen: Soft, nontender, no distention.    Neurologic: Alert and oriented.  Psych: Normal affect. Skin: Normal. Musculoskeletal: No gross deformities.    ECG: Most recent ECG reviewed.   Labs: Lab Results  Component Value Date/Time   K 3.6 07/18/2017 04:23 AM   BUN 15 07/18/2017 04:23 AM   CREATININE 1.18 07/18/2017 04:23 AM   HGB 14.3 07/18/2017 04:23 AM  Lipids: Lab Results  Component Value Date/Time   LDLCALC 46 11/05/2017 08:48 AM   CHOL 111 11/05/2017 08:48 AM   TRIG 112 11/05/2017 08:48 AM   HDL 43 11/05/2017 08:48 AM       ASSESSMENT AND PLAN: 1.  Coronary artery disease status post drug-eluting stent placement to the mid to distal LAD: Symptomatically stable overall.  Continue dual antiplatelet therapy for 1 year with aspirin and Brilinta.  Continue carvedilol and Crestor.  2.  Chronic hypertension: Blood pressure is elevated today.  It was low normal at his last visit with associated dizziness.  I stopped amlodipine at that time. He checks his blood pressure routinely at home and it runs in the 120-130/78-82 range. No changes to therapy.  3.  Hyperlipidemia: Continue statin therapy with Crestor 20 mg.  LDL 46 on 11/05/17 (down from 134 on 07/18/17).       Disposition: Follow up 6 months   Kate Sable, M.D., F.A.C.C.

## 2018-03-24 ENCOUNTER — Telehealth: Payer: Self-pay | Admitting: Cardiovascular Disease

## 2018-03-24 NOTE — Telephone Encounter (Signed)
Patient states that he has questions in regards to applying for disability / tg

## 2018-03-24 NOTE — Telephone Encounter (Signed)
I advised patient to speak with social security administration regarding how to file for disability, he will call them

## 2018-07-01 NOTE — Progress Notes (Signed)
Need orders for 07-30-18 surgery in epic

## 2018-07-04 ENCOUNTER — Ambulatory Visit: Payer: Self-pay | Admitting: Orthopedic Surgery

## 2018-07-07 ENCOUNTER — Ambulatory Visit: Payer: Self-pay | Admitting: Orthopedic Surgery

## 2018-07-07 ENCOUNTER — Telehealth: Payer: Self-pay | Admitting: Cardiovascular Disease

## 2018-07-07 ENCOUNTER — Encounter: Payer: Self-pay | Admitting: *Deleted

## 2018-07-07 NOTE — H&P (View-Only) (Signed)
TOTAL KNEE ADMISSION H&P  Patient is being admitted for left total knee arthroplasty.  Subjective:  Chief Complaint:left knee pain.  HPI: Stephen Graves, 63 y.o. male, has a history of pain and functional disability in the left knee due to arthritis and has failed non-surgical conservative treatments for greater than 12 weeks to includeNSAID's and/or analgesics, corticosteriod injections, flexibility and strengthening excercises, use of assistive devices, weight reduction as appropriate and activity modification.  Onset of symptoms was gradual, starting 3 years ago with rapidlly worsening course since that time. The patient noted no past surgery on the left knee(s).  Patient currently rates pain in the left knee(s) at 10 out of 10 with activity. Patient has night pain, worsening of pain with activity and weight bearing, pain that interferes with activities of daily living, pain with passive range of motion, crepitus and joint swelling.  Patient has evidence of subchondral cysts, subchondral sclerosis, periarticular osteophytes, joint subluxation and joint space narrowing by imaging studies. There is no active infection.  Patient Active Problem List   Diagnosis Date Noted  . Coronary artery disease involving native coronary 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 ([place]) 07/18/2017  . Acute coronary syndrome ([place])   . Chest pain 07/16/2017  . HLD (hyperlipidemia) 07/16/2017  . Anxiety state 02/15/2010  . Essential hypertension 02/15/2010  . CHEST PAIN UNSPECIFIED 02/15/2010   Past Medical History:  Diagnosis Date  . Anxiety   . Coronary artery disease involving native coronary 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 ([place]) 07/18/2017   Cardiac cath 1020 1418: Mid LAD to Dist LAD lesion, 99 %stenosed. - PCI with resolute DES (2.5 X 26 mm Resolute Onyx stent - 2.75 mm);ostial LAD 30%. Ostial ramus 40%. Proximal to mid LAD 50%. Normal LV function and normal LVEDP.   [name]  Hiatal hernia   . High cholesterol   . HTN (hypertension)   . Skull fracture [facility])     Past Surgical History:  Procedure Laterality Date  . CORONARY STENT INTERVENTION N/A 07/17/2017   Procedure: CORONARY STENT INTERVENTION;  Surgeon: Wellington Hampshire, MD;  Location: [place] [facility];  Service: Cardiovascular;  Laterality: N/A;  . FOOT SURGERY Left   . LEFT HEART CATH AND CORONARY ANGIOGRAPHY N/A 07/17/2017   Procedure: LEFT HEART CATH AND CORONARY ANGIOGRAPHY;  Surgeon: Wellington Hampshire, MD;  Location: [place] [facility];  Service: Cardiovascular;  Laterality: N/A;  . left wrist surgery    . SKIN GRAFT  1998    Current Outpatient Medications  Medication Sig Dispense Refill Last Dose  . aspirin 81 MG chewable tablet Chew 81 mg by mouth daily.   Taking  . carvedilol (COREG) 3.125 MG tablet Take 1 tablet (3.125 mg total) by mouth 2 (two) times daily. 60 tablet 11 Taking  . citalopram (CELEXA) 40 MG tablet Take 40 mg by mouth daily.    Taking  . fish oil-omega-3 fatty acids 1000 MG capsule Take 1 g by mouth daily.   Taking  . losartan-hydrochlorothiazide (HYZAAR) 100-25 MG tablet Take 0.5 tablets by mouth daily.    Taking  . nitroGLYCERIN (NITROSTAT) 0.4 MG SL tablet Place 1 tablet (0.4 mg total) under the tongue every 5 (five) minutes as needed for chest pain. 25 tablet 3 Taking  . potassium chloride SA (K-DUR,KLOR-CON) 20 MEQ tablet Take 20 mEq by mouth daily.     Taking  . rosuvastatin (CRESTOR) 20 MG tablet Take 1 tablet (20 mg total) by mouth daily  at 6 PM. 90 tablet 3 Taking  . ticagrelor (BRILINTA) 90 MG TABS tablet Take 1 tablet (90 mg total) by mouth 2 (two) times daily. 180 tablet 3 Taking   No current facility-administered medications for this visit.    Allergies  Allergen Reactions  . Lipitor [Atorvastatin]     Pain in neck and legs after taking medication    Social History   Tobacco Use  . Smoking status: Former Smoker    Types: Cigarettes    Last attempt to quit:  09/24/1988    Years since quitting: 29.8  . Smokeless tobacco: Never Used  . Tobacco comment: tobacco use - no  Substance Use Topics  . Alcohol use: No    Family History  Problem Relation Age of Onset  . Stroke Father 48  . Heart attack Father 45  . CAD Mother   . Prostate cancer Brother   . Thyroid nodules Brother      Review of Systems  Constitutional: Negative.   HENT: Positive for hearing loss.   Eyes: Negative.   Respiratory: Negative.   Cardiovascular: Negative.   Gastrointestinal: Negative.   Genitourinary: Positive for frequency.  Musculoskeletal: Positive for joint pain.  Skin: Negative.   Neurological: Negative.   Endo/Heme/Allergies: Negative.   Psychiatric/Behavioral: The patient is nervous/anxious.     Objective:  Physical Exam  Vitals reviewed. Constitutional: He is oriented to person, place, and time. He appears well-developed and well-nourished.  HENT:  Head: Normocephalic and atraumatic.  Eyes: Pupils are equal, round, and reactive to light. Conjunctivae and EOM are normal.  Neck: Normal range of motion. Neck supple.  Cardiovascular: Normal rate, regular rhythm, normal heart sounds and intact distal pulses.  Respiratory: Effort normal and breath sounds normal. No respiratory distress.  GI: Soft. Bowel sounds are normal. He exhibits no distension.  Genitourinary:  Genitourinary Comments: deferred  Musculoskeletal:       Left knee: He exhibits decreased range of motion, swelling, effusion and abnormal alignment. Tenderness found. Medial joint line tenderness noted.  Neurological: He is alert and oriented to person, place, and time. He has normal reflexes.  Skin: Skin is warm and dry.  Psychiatric: He has a normal mood and affect. His behavior is normal. Judgment and thought content normal.    Vital signs in last 24 hours: @VSRANGES @  Labs:   Estimated body mass index is 28.79 kg/m as calculated from the following:   Height as of 03/06/18: 5\' 11"   (1.803 m).   Weight as of 03/06/18: 93.6 kg.   Imaging Review Plain radiographs demonstrate severe degenerative joint disease of the left knee(s). The overall alignment issignificant varus. The bone quality appears to be adequate for age and reported activity level.   Preoperative templating of the joint replacement has been completed, documented, and submitted to the Operating Room personnel in order to optimize intra-operative equipment management.    Patient's anticipated LOS is less than 2 midnights, meeting these requirements: - Younger than 58 - Lives within 1 hour of care - Has a competent adult at home to recover with post-op recover - NO history of  - Chronic pain requiring opiods  - Diabetes  - Coronary Artery Disease  - Heart failure  - Heart attack  - Stroke  - DVT/VTE  - Cardiac arrhythmia  - Respiratory Failure/COPD  - Renal failure  - Anemia  - Advanced Liver disease        Assessment/Plan:  End stage arthritis, left knee   The patient  history, physical examination, clinical judgment of the provider and imaging studies are consistent with end stage degenerative joint disease of the left knee(s) and total knee arthroplasty is deemed medically necessary. The treatment options including medical management, injection therapy arthroscopy and arthroplasty were discussed at length. The risks and benefits of total knee arthroplasty were presented and reviewed. The risks due to aseptic loosening, infection, stiffness, patella tracking problems, thromboembolic complications and other imponderables were discussed. The patient acknowledged the explanation, agreed to proceed with the plan and consent was signed. Patient is being admitted for inpatient treatment for surgery, pain control, PT, OT, prophylactic antibiotics, VTE prophylaxis, progressive ambulation and ADL's and discharge planning. The patient is planning to be discharged home with outpatient PT at [facility] in [place]. Rx  given for walker.

## 2018-07-07 NOTE — Telephone Encounter (Signed)
Pt is needing to know if he's to resume his Brilinta after his surgery which is scheduled on 07/30/18--we have a surgical clearance that states to start back one day after surgery BUT his LOV note says to stop Brilinta on 07/25/18   Per pt-- Dr. Lyla Glassing has told pt he will not do surgery if he has to start back on Brilinta after the surgery.    please clarify

## 2018-07-07 NOTE — Telephone Encounter (Signed)
Brilinta can be stopped (no need to resume after surgery).

## 2018-07-07 NOTE — Telephone Encounter (Signed)
Pt made aware

## 2018-07-07 NOTE — H&P (Signed)
TOTAL KNEE ADMISSION H&P  Patient is being admitted for left total knee arthroplasty.  Subjective:  Chief Complaint:left knee pain.  HPI: Stephen Graves, 62 y.o. male, has a history of pain and functional disability in the left knee due to arthritis and has failed non-surgical conservative treatments for greater than 12 weeks to includeNSAID's and/or analgesics, corticosteriod injections, flexibility and strengthening excercises, use of assistive devices, weight reduction as appropriate and activity modification.  Onset of symptoms was gradual, starting 3 years ago with rapidlly worsening course since that time. The patient noted no past surgery on the left knee(s).  Patient currently rates pain in the left knee(s) at 10 out of 10 with activity. Patient has night pain, worsening of pain with activity and weight bearing, pain that interferes with activities of daily living, pain with passive range of motion, crepitus and joint swelling.  Patient has evidence of subchondral cysts, subchondral sclerosis, periarticular osteophytes, joint subluxation and joint space narrowing by imaging studies. There is no active infection.  Patient Active Problem List   Diagnosis Date Noted  . Coronary artery disease involving native coronary 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 ([place]) 07/18/2017  . Acute coronary syndrome ([place])   . Chest pain 07/16/2017  . HLD (hyperlipidemia) 07/16/2017  . Anxiety state 02/15/2010  . Essential hypertension 02/15/2010  . CHEST PAIN UNSPECIFIED 02/15/2010   Past Medical History:  Diagnosis Date  . Anxiety   . Coronary artery disease involving native coronary 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 ([place]) 07/18/2017   Cardiac cath 1020 1418: Mid LAD to Dist LAD lesion, 99 %stenosed. - PCI with resolute DES (2.5 X 26 mm Resolute Onyx stent - 2.75 mm);ostial LAD 30%. Ostial ramus 40%. Proximal to mid LAD 50%. Normal LV function and normal LVEDP.   [name]  Hiatal hernia   . High cholesterol   . HTN (hypertension)   . Skull fracture [facility])     Past Surgical History:  Procedure Laterality Date  . CORONARY STENT INTERVENTION N/A 07/17/2017   Procedure: CORONARY STENT INTERVENTION;  Surgeon: Wellington Hampshire, MD;  Location: [place] [facility];  Service: Cardiovascular;  Laterality: N/A;  . FOOT SURGERY Left   . LEFT HEART CATH AND CORONARY ANGIOGRAPHY N/A 07/17/2017   Procedure: LEFT HEART CATH AND CORONARY ANGIOGRAPHY;  Surgeon: Wellington Hampshire, MD;  Location: [place] [facility];  Service: Cardiovascular;  Laterality: N/A;  . left wrist surgery    . SKIN GRAFT  1998    Current Outpatient Medications  Medication Sig Dispense Refill Last Dose  . aspirin 81 MG chewable tablet Chew 81 mg by mouth daily.   Taking  . carvedilol (COREG) 3.125 MG tablet Take 1 tablet (3.125 mg total) by mouth 2 (two) times daily. 60 tablet 11 Taking  . citalopram (CELEXA) 40 MG tablet Take 40 mg by mouth daily.    Taking  . fish oil-omega-3 fatty acids 1000 MG capsule Take 1 g by mouth daily.   Taking  . losartan-hydrochlorothiazide (HYZAAR) 100-25 MG tablet Take 0.5 tablets by mouth daily.    Taking  . nitroGLYCERIN (NITROSTAT) 0.4 MG SL tablet Place 1 tablet (0.4 mg total) under the tongue every 5 (five) minutes as needed for chest pain. 25 tablet 3 Taking  . potassium chloride SA (K-DUR,KLOR-CON) 20 MEQ tablet Take 20 mEq by mouth daily.     Taking  . rosuvastatin (CRESTOR) 20 MG tablet Take 1 tablet (20 mg total) by mouth daily  at 6 PM. 90 tablet 3 Taking  . ticagrelor (BRILINTA) 90 MG TABS tablet Take 1 tablet (90 mg total) by mouth 2 (two) times daily. 180 tablet 3 Taking   No current facility-administered medications for this visit.    Allergies  Allergen Reactions  . Lipitor [Atorvastatin]     Pain in neck and legs after taking medication    Social History   Tobacco Use  . Smoking status: Former Smoker    Types: Cigarettes    Last attempt to quit:  09/24/1988    Years since quitting: 29.8  . Smokeless tobacco: Never Used  . Tobacco comment: tobacco use - no  Substance Use Topics  . Alcohol use: No    Family History  Problem Relation Age of Onset  . Stroke Father 80  . Heart attack Father 38  . CAD Mother   . Prostate cancer Brother   . Thyroid nodules Brother      Review of Systems  Constitutional: Negative.   HENT: Positive for hearing loss.   Eyes: Negative.   Respiratory: Negative.   Cardiovascular: Negative.   Gastrointestinal: Negative.   Genitourinary: Positive for frequency.  Musculoskeletal: Positive for joint pain.  Skin: Negative.   Neurological: Negative.   Endo/Heme/Allergies: Negative.   Psychiatric/Behavioral: The patient is nervous/anxious.     Objective:  Physical Exam  Vitals reviewed. Constitutional: He is oriented to person, place, and time. He appears well-developed and well-nourished.  HENT:  Head: Normocephalic and atraumatic.  Eyes: Pupils are equal, round, and reactive to light. Conjunctivae and EOM are normal.  Neck: Normal range of motion. Neck supple.  Cardiovascular: Normal rate, regular rhythm, normal heart sounds and intact distal pulses.  Respiratory: Effort normal and breath sounds normal. No respiratory distress.  GI: Soft. Bowel sounds are normal. He exhibits no distension.  Genitourinary:  Genitourinary Comments: deferred  Musculoskeletal:       Left knee: He exhibits decreased range of motion, swelling, effusion and abnormal alignment. Tenderness found. Medial joint line tenderness noted.  Neurological: He is alert and oriented to person, place, and time. He has normal reflexes.  Skin: Skin is warm and dry.  Psychiatric: He has a normal mood and affect. His behavior is normal. Judgment and thought content normal.    Vital signs in last 24 hours: @VSRANGES @  Labs:   Estimated body mass index is 28.79 kg/m as calculated from the following:   Height as of 03/06/18: 5\' 11"   (1.803 m).   Weight as of 03/06/18: 93.6 kg.   Imaging Review Plain radiographs demonstrate severe degenerative joint disease of the left knee(s). The overall alignment issignificant varus. The bone quality appears to be adequate for age and reported activity level.   Preoperative templating of the joint replacement has been completed, documented, and submitted to the Operating Room personnel in order to optimize intra-operative equipment management.    Patient's anticipated LOS is less than 2 midnights, meeting these requirements: - Younger than 64 - Lives within 1 hour of care - Has a competent adult at home to recover with post-op recover - NO history of  - Chronic pain requiring opiods  - Diabetes  - Coronary Artery Disease  - Heart failure  - Heart attack  - Stroke  - DVT/VTE  - Cardiac arrhythmia  - Respiratory Failure/COPD  - Renal failure  - Anemia  - Advanced Liver disease        Assessment/Plan:  End stage arthritis, left knee   The patient  history, physical examination, clinical judgment of the provider and imaging studies are consistent with end stage degenerative joint disease of the left knee(s) and total knee arthroplasty is deemed medically necessary. The treatment options including medical management, injection therapy arthroscopy and arthroplasty were discussed at length. The risks and benefits of total knee arthroplasty were presented and reviewed. The risks due to aseptic loosening, infection, stiffness, patella tracking problems, thromboembolic complications and other imponderables were discussed. The patient acknowledged the explanation, agreed to proceed with the plan and consent was signed. Patient is being admitted for inpatient treatment for surgery, pain control, PT, OT, prophylactic antibiotics, VTE prophylaxis, progressive ambulation and ADL's and discharge planning. The patient is planning to be discharged home with outpatient PT at [facility] in [place]. Rx  given for walker.

## 2018-07-16 NOTE — Patient Instructions (Signed)
Stephen Graves  07/16/2018   Your procedure is scheduled on: 07-30-18     Report to [facility] Main  Entrance    Report to Admitting at 11:30 AM    Call this number if you have problems the morning of surgery [phone]     Remember: Do not eat food or drink liquids :After Midnight. You may have a Clear Liquid Diet from Midnight until 8:00 AM. After 8:00 AM, nothing until after surgery.     CLEAR LIQUID DIET   Foods Allowed                                                                     Foods Excluded  Coffee and tea, regular and decaf                             liquids that you cannot  Plain Jell-O in any flavor                                             see through such as: Fruit ices (not with fruit pulp)                                     milk, soups, orange juice  Iced Popsicles                                    All solid food Carbonated beverages, regular and diet                                    Cranberry, grape and apple juices Sports drinks like Gatorade Lightly seasoned clear broth or consume(fat free) Sugar, honey syrup  Sample Menu Breakfast                                Lunch                                     Supper Cranberry juice                    Beef broth                            Chicken broth Jell-O                                     Grape juice  Apple juice Coffee or tea                        Jell-O                                      Popsicle                                                Coffee or tea                        Coffee or tea  _____________________________________________________________________     BRUSH YOUR TEETH MORNING OF SURGERY AND RINSE YOUR MOUTH OUT, NO CHEWING GUM CANDY OR MINTS.     Take these medicines the morning of surgery with A SIP OF WATER: Carvedilol (Coreg), and Citalopram (Celexa)                                You may not have any metal  on your body including hair pins and              piercings  Do not wear jewelry, lotions, powders, cologne or  deodorant           Men may shave face and neck.   Do not bring valuables to the hospital. [name]  Contacts, dentures or bridgework may not be worn into surgery.  Leave suitcase in the car. After surgery it may be brought to your room.    Special Instructions: N/A              Please read over the following fact sheets you were given: _____________________________________________________________________             [facility] - Preparing for Surgery Before surgery, you can play an important role.  Because skin is not sterile, your skin needs to be as free of germs as possible.  You can reduce the number of germs on your skin by washing with CHG (chlorahexidine gluconate) soap before surgery.  CHG is an antiseptic cleaner which kills germs and bonds with the skin to continue killing germs even after washing. Please DO NOT use if you have an allergy to CHG or antibacterial soaps.  If your skin becomes reddened/irritated stop using the CHG and inform your nurse when you arrive at Short Stay. Do not shave (including legs and underarms) for at least 48 hours prior to the first CHG shower.  You may shave your face/neck. Please follow these instructions carefully:  1.  Shower with CHG Soap the night before surgery and the  morning of Surgery.  2.  If you choose to wash your hair, wash your hair first as usual with your  normal  shampoo.  3.  After you shampoo, rinse your hair and body thoroughly to remove the  shampoo.                           4.  Use CHG as you would any other liquid soap.  You can apply chg directly  to the skin and wash                       Gently with a scrungie or clean washcloth.  5.  Apply the CHG Soap to your body ONLY FROM THE NECK DOWN.   Do not use on face/ open                           Wound or open  sores. Avoid contact with eyes, ears mouth and genitals (private parts).                       Wash face,  Genitals (private parts) with your normal soap.             6.  Wash thoroughly, paying special attention to the area where your surgery  will be performed.  7.  Thoroughly rinse your body with warm water from the neck down.  8.  DO NOT shower/wash with your normal soap after using and rinsing off  the CHG Soap.                9.  Pat yourself dry with a clean towel.            10.  Wear clean pajamas.            11.  Place clean sheets on your bed the night of your first shower and do not  sleep with pets. Day of Surgery : Do not apply any lotions/deodorants the morning of surgery.  Please wear clean clothes to the hospital/surgery center.  FAILURE TO FOLLOW THESE INSTRUCTIONS MAY RESULT IN THE CANCELLATION OF YOUR SURGERY PATIENT SIGNATURE_________________________________  NURSE SIGNATURE__________________________________  ________________________________________________________________________   Stephen Graves  An incentive spirometer is a tool that can help keep your lungs clear and active. This tool measures how well you are filling your lungs with each breath. Taking long deep breaths may help reverse or decrease the chance of developing breathing (pulmonary) problems (especially infection) following:  A long period of time when you are unable to move or be active. BEFORE THE PROCEDURE   If the spirometer includes an indicator to show your best effort, your nurse or respiratory therapist will set it to a desired goal.  If possible, sit up straight or lean slightly forward. Try not to slouch.  Hold the incentive spirometer in an upright position. INSTRUCTIONS FOR USE  1. Sit on the edge of your bed if possible, or sit up as far as you can in bed or on a chair. 2. Hold the incentive spirometer in an upright position. 3. Breathe out normally. 4. Place the mouthpiece  in your mouth and seal your lips tightly around it. 5. Breathe in slowly and as deeply as possible, raising the piston or the ball toward the top of the column. 6. Hold your breath for 3-5 seconds or for as long as possible. Allow the piston or ball to fall to the bottom of the column. 7. Remove the mouthpiece from your mouth and breathe out normally. 8. Rest for a few seconds and repeat Steps 1 through 7 at least 10 times every 1-2 hours when you are awake. Take your time and take a few normal breaths between deep breaths. 9. The spirometer may include an indicator to show your best effort. Use the indicator as a goal to work  toward during each repetition. 10. After each set of 10 deep breaths, practice coughing to be sure your lungs are clear. If you have an incision (the cut made at the time of surgery), support your incision when coughing by placing a pillow or rolled up towels firmly against it. Once you are able to get out of bed, walk around indoors and cough well. You may stop using the incentive spirometer when instructed by your caregiver.  RISKS AND COMPLICATIONS  Take your time so you do not get dizzy or light-headed.  If you are in pain, you may need to take or ask for pain medication before doing incentive spirometry. It is harder to take a deep breath if you are having pain. AFTER USE  Rest and breathe slowly and easily.  It can be helpful to keep track of a log of your progress. Your caregiver can provide you with a simple table to help with this. If you are using the spirometer at home, follow these instructions: Pearl IF:   You are having difficultly using the spirometer.  You have trouble using the spirometer as often as instructed.  Your pain medication is not giving enough relief while using the spirometer.  You develop fever of 100.5 F (38.1 C) or higher. SEEK IMMEDIATE MEDICAL CARE IF:   You cough up bloody sputum that had not been present  before.  You develop fever of 102 F (38.9 C) or greater.  You develop worsening pain at or near the incision site. MAKE SURE YOU:   Understand these instructions.  Will watch your condition.  Will get help right away if you are not doing well or get worse. Document Released: 01/21/2007 Document Revised: 12/03/2011 Document Reviewed: 03/24/2007 Exit[employer] Patient Information 2014 [employer], [place].   ________________________________________________________________________  WHAT IS A BLOOD TRANSFUSION? Blood Transfusion Information  A transfusion is the replacement of blood or some of its parts. Blood is made up of multiple cells which provide different functions.  Red blood cells carry oxygen and are used for blood loss replacement.  White blood cells fight against infection.  Platelets control bleeding.  Plasma helps clot blood.  Other blood products are available for specialized needs, such as hemophilia or other clotting disorders. BEFORE THE TRANSFUSION  Who gives blood for transfusions?   Healthy volunteers who are fully evaluated to make sure their blood is safe. This is blood bank blood. Transfusion therapy is the safest it has ever been in the practice of medicine. Before blood is taken from a donor, a complete history is taken to make sure that person has no history of diseases nor engages in risky social behavior (examples are intravenous drug use or sexual activity with multiple partners). The donor's travel history is screened to minimize risk of transmitting infections, such as malaria. The donated blood is tested for signs of infectious diseases, such as HIV and hepatitis. The blood is then tested to be sure it is compatible with you in order to minimize the chance of a transfusion reaction. If you or a relative donates blood, this is often done in anticipation of surgery and is not appropriate for emergency situations. It takes many days to process the donated  blood. RISKS AND COMPLICATIONS Although transfusion therapy is very safe and saves many lives, the main dangers of transfusion include:   Getting an infectious disease.  Developing a transfusion reaction. This is an allergic reaction to something in the blood you were given. Every precaution is taken  to prevent this. The decision to have a blood transfusion has been considered carefully by your caregiver before blood is given. Blood is not given unless the benefits outweigh the risks. AFTER THE TRANSFUSION  Right after receiving a blood transfusion, you will usually feel much better and more energetic. This is especially true if your red blood cells have gotten low (anemic). The transfusion raises the level of the red blood cells which carry oxygen, and this usually causes an energy increase.  The nurse administering the transfusion will monitor you carefully for complications. HOME CARE INSTRUCTIONS  No special instructions are needed after a transfusion. You may find your energy is better. Speak with your caregiver about any limitations on activity for underlying diseases you may have. SEEK MEDICAL CARE IF:   Your condition is not improving after your transfusion.  You develop redness or irritation at the intravenous (IV) site. SEEK IMMEDIATE MEDICAL CARE IF:  Any of the following symptoms occur over the next 12 hours:  Shaking chills.  You have a temperature by mouth above 102 F (38.9 C), not controlled by medicine.  Chest, back, or muscle pain.  People around you feel you are not acting correctly or are confused.  Shortness of breath or difficulty breathing.  Dizziness and fainting.  You get a rash or develop hives.  You have a decrease in urine output.  Your urine turns a dark color or changes to pink, red, or brown. Any of the following symptoms occur over the next 10 days:  You have a temperature by mouth above 102 F (38.9 C), not controlled by  medicine.  Shortness of breath.  Weakness after normal activity.  The white part of the eye turns yellow (jaundice).  You have a decrease in the amount of urine or are urinating less often.  Your urine turns a dark color or changes to pink, red, or brown. Document Released: 09/07/2000 Document Revised: 12/03/2011 Document Reviewed: 04/26/2008 [facility] Patient Information 2014 [place], [place].  _______________________________________________________________________

## 2018-07-16 NOTE — Progress Notes (Signed)
07-07-18 Cardiac Clearance from Dr. Bronson Ing on chart  05-12-18  Surgical clearance from Dr. Hilma Favors on chart  07-17-17 (Epic) ECHO

## 2018-07-23 ENCOUNTER — Other Ambulatory Visit: Payer: Self-pay

## 2018-07-23 ENCOUNTER — Encounter (HOSPITAL_COMMUNITY)
Admission: RE | Admit: 2018-07-23 | Discharge: 2018-07-23 | Disposition: A | Discharge status: Home / Self Care | Payer: BC Managed Care – PPO | Source: Ambulatory Visit | Attending: Orthopedic Surgery | Admitting: Orthopedic Surgery

## 2018-07-23 ENCOUNTER — Encounter (HOSPITAL_COMMUNITY): Payer: Self-pay

## 2018-07-23 ENCOUNTER — Telehealth: Payer: Self-pay | Admitting: Cardiovascular Disease

## 2018-07-23 DIAGNOSIS — Z01818 Encounter for other preprocedural examination: Secondary | ICD-10-CM | POA: Insufficient documentation

## 2018-07-23 DIAGNOSIS — M1712 Unilateral primary osteoarthritis, left knee: Secondary | ICD-10-CM | POA: Insufficient documentation

## 2018-07-23 DIAGNOSIS — I10 Essential (primary) hypertension: Secondary | ICD-10-CM | POA: Diagnosis not present

## 2018-07-23 DIAGNOSIS — R001 Bradycardia, unspecified: Secondary | ICD-10-CM | POA: Diagnosis not present

## 2018-07-23 HISTORY — DX: Injury of optic nerve, unspecified eye, initial encounter: S04.019A

## 2018-07-23 LAB — BASIC METABOLIC PANEL
ANION GAP: 6 (ref 5–15)
BUN: 18 mg/dL (ref 8–23)
CALCIUM: 9.3 mg/dL (ref 8.9–10.3)
CO2: 29 mmol/L (ref 22–32)
Chloride: 105 mmol/L (ref 98–111)
Creatinine, Ser: 1.07 mg/dL (ref 0.61–1.24)
Glucose, Bld: 101 mg/dL — ABNORMAL HIGH (ref 70–99)
POTASSIUM: 4.1 mmol/L (ref 3.5–5.1)
Sodium: 140 mmol/L (ref 135–145)

## 2018-07-23 LAB — SURGICAL PCR SCREEN
MRSA, PCR: NEGATIVE
STAPHYLOCOCCUS AUREUS: NEGATIVE

## 2018-07-23 LAB — CBC
HCT: 44.4 % (ref 39.0–52.0)
Hemoglobin: 14.8 g/dL (ref 13.0–17.0)
MCH: 32.7 pg (ref 26.0–34.0)
MCHC: 33.3 g/dL (ref 30.0–36.0)
MCV: 98 fL (ref 80.0–100.0)
NRBC: 0 % (ref 0.0–0.2)
PLATELETS: 110 10*3/uL — AB (ref 150–400)
RBC: 4.53 MIL/uL (ref 4.22–5.81)
RDW: 12.6 % (ref 11.5–15.5)
WBC: 4.8 10*3/uL (ref 4.0–10.5)

## 2018-07-23 LAB — ABO/RH: ABO/RH(D): O POS

## 2018-07-23 NOTE — Telephone Encounter (Signed)
Pt lvm wanting to ask a question about his medication-- can be reached @ [phone]

## 2018-07-23 NOTE — Telephone Encounter (Signed)
Pt will stop aspirin 7 days from procedure

## 2018-07-30 ENCOUNTER — Other Ambulatory Visit: Payer: Self-pay

## 2018-07-30 ENCOUNTER — Inpatient Hospital Stay (HOSPITAL_COMMUNITY): Payer: BC Managed Care – PPO | Admitting: Certified Registered Nurse Anesthetist

## 2018-07-30 ENCOUNTER — Inpatient Hospital Stay (HOSPITAL_COMMUNITY)
Admission: RE | Admit: 2018-07-30 | Discharge: 2018-07-31 | DRG: 470 | Disposition: A | Discharge status: Home / Self Care | Payer: BC Managed Care – PPO | Attending: Orthopedic Surgery | Admitting: Orthopedic Surgery

## 2018-07-30 ENCOUNTER — Encounter (HOSPITAL_COMMUNITY)
Admission: RE | Disposition: A | Discharge status: Home / Self Care | Payer: Self-pay | Source: Home / Self Care | Attending: Orthopedic Surgery

## 2018-07-30 ENCOUNTER — Inpatient Hospital Stay (HOSPITAL_COMMUNITY): Payer: BC Managed Care – PPO

## 2018-07-30 ENCOUNTER — Encounter (HOSPITAL_COMMUNITY): Payer: Self-pay | Admitting: General Practice

## 2018-07-30 DIAGNOSIS — Z87891 Personal history of nicotine dependence: Secondary | ICD-10-CM

## 2018-07-30 DIAGNOSIS — Z8349 Family history of other endocrine, nutritional and metabolic diseases: Secondary | ICD-10-CM | POA: Diagnosis not present

## 2018-07-30 DIAGNOSIS — Z8042 Family history of malignant neoplasm of prostate: Secondary | ICD-10-CM

## 2018-07-30 DIAGNOSIS — F419 Anxiety disorder, unspecified: Secondary | ICD-10-CM | POA: Diagnosis present

## 2018-07-30 DIAGNOSIS — Z823 Family history of stroke: Secondary | ICD-10-CM | POA: Diagnosis not present

## 2018-07-30 DIAGNOSIS — Z8249 Family history of ischemic heart disease and other diseases of the circulatory system: Secondary | ICD-10-CM

## 2018-07-30 DIAGNOSIS — M25562 Pain in left knee: Secondary | ICD-10-CM | POA: Diagnosis present

## 2018-07-30 DIAGNOSIS — Z7902 Long term (current) use of antithrombotics/antiplatelets: Secondary | ICD-10-CM | POA: Diagnosis not present

## 2018-07-30 DIAGNOSIS — Z888 Allergy status to other drugs, medicaments and biological substances status: Secondary | ICD-10-CM

## 2018-07-30 DIAGNOSIS — I9581 Postprocedural hypotension: Secondary | ICD-10-CM | POA: Diagnosis not present

## 2018-07-30 DIAGNOSIS — I10 Essential (primary) hypertension: Secondary | ICD-10-CM | POA: Diagnosis present

## 2018-07-30 DIAGNOSIS — I25110 Atherosclerotic heart disease of native coronary artery with unstable angina pectoris: Secondary | ICD-10-CM | POA: Diagnosis present

## 2018-07-30 DIAGNOSIS — Z7982 Long term (current) use of aspirin: Secondary | ICD-10-CM

## 2018-07-30 DIAGNOSIS — M1712 Unilateral primary osteoarthritis, left knee: Secondary | ICD-10-CM | POA: Diagnosis present

## 2018-07-30 DIAGNOSIS — Z96652 Presence of left artificial knee joint: Secondary | ICD-10-CM

## 2018-07-30 DIAGNOSIS — Z955 Presence of coronary angioplasty implant and graft: Secondary | ICD-10-CM | POA: Diagnosis not present

## 2018-07-30 DIAGNOSIS — K449 Diaphragmatic hernia without obstruction or gangrene: Secondary | ICD-10-CM | POA: Diagnosis present

## 2018-07-30 DIAGNOSIS — E7800 Pure hypercholesterolemia, unspecified: Secondary | ICD-10-CM | POA: Diagnosis present

## 2018-07-30 DIAGNOSIS — Z79899 Other long term (current) drug therapy: Secondary | ICD-10-CM

## 2018-07-30 HISTORY — PX: KNEE ARTHROPLASTY: SHX992

## 2018-07-30 LAB — TYPE AND SCREEN
ABO/RH(D): O POS
Antibody Screen: NEGATIVE

## 2018-07-30 SURGERY — ARTHROPLASTY, KNEE, TOTAL, USING IMAGELESS COMPUTER-ASSISTED NAVIGATION
Anesthesia: Spinal | Site: Knee | Laterality: Left

## 2018-07-30 MED ORDER — HYDROCODONE-ACETAMINOPHEN 5-325 MG PO TABS
1.0000 | ORAL_TABLET | ORAL | Status: DC | PRN
  Administered 2018-07-31: 1 via ORAL
  Filled 2018-07-30: qty 1

## 2018-07-30 MED ORDER — LOSARTAN POTASSIUM 50 MG PO TABS
100.0000 mg | ORAL_TABLET | Freq: Every day | ORAL | Status: DC
  Administered 2018-07-31: 100 mg via ORAL
  Filled 2018-07-30: qty 2

## 2018-07-30 MED ORDER — SODIUM CHLORIDE 0.9 % IV SOLN
INTRAVENOUS | Status: DC
  Administered 2018-07-30 – 2018-07-31 (×3): via INTRAVENOUS

## 2018-07-30 MED ORDER — LIDOCAINE 2% (20 MG/ML) 5 ML SYRINGE
INTRAMUSCULAR | Status: AC
  Filled 2018-07-30: qty 5

## 2018-07-30 MED ORDER — DIPHENHYDRAMINE HCL 12.5 MG/5ML PO ELIX: 12.5000 mg | ORAL_SOLUTION | ORAL | Status: DC | PRN

## 2018-07-30 MED ORDER — MIDAZOLAM HCL 2 MG/2ML IJ SOLN
INTRAMUSCULAR | Status: AC
  Filled 2018-07-30: qty 2

## 2018-07-30 MED ORDER — ISOPROPYL ALCOHOL 70 % SOLN
Status: DC | PRN
  Administered 2018-07-30: 1 via TOPICAL

## 2018-07-30 MED ORDER — MORPHINE SULFATE (PF) 2 MG/ML IV SOLN: 0.5000 mg | INTRAVENOUS | Status: DC | PRN

## 2018-07-30 MED ORDER — ONDANSETRON HCL 4 MG PO TABS: 4.0000 mg | ORAL_TABLET | Freq: Four times a day (QID) | ORAL | Status: DC | PRN

## 2018-07-30 MED ORDER — SODIUM CHLORIDE 0.9 % IV BOLUS
500.0000 mL | Freq: Once | INTRAVENOUS | Status: AC
  Administered 2018-07-30: 500 mL via INTRAVENOUS

## 2018-07-30 MED ORDER — PROPOFOL 10 MG/ML IV BOLUS
INTRAVENOUS | Status: DC | PRN
  Administered 2018-07-30 (×2): 20 mg via INTRAVENOUS

## 2018-07-30 MED ORDER — SODIUM CHLORIDE (PF) 0.9 % IJ SOLN
INTRAMUSCULAR | Status: AC
  Filled 2018-07-30: qty 50

## 2018-07-30 MED ORDER — ALUM & MAG HYDROXIDE-SIMETH 200-200-20 MG/5ML PO SUSP: 30.0000 mL | ORAL | Status: DC | PRN

## 2018-07-30 MED ORDER — ROSUVASTATIN CALCIUM 20 MG PO TABS
20.0000 mg | ORAL_TABLET | Freq: Every day | ORAL | Status: DC
  Administered 2018-07-30: 20 mg via ORAL
  Filled 2018-07-30: qty 1

## 2018-07-30 MED ORDER — ROPIVACAINE HCL 7.5 MG/ML IJ SOLN
INTRAMUSCULAR | Status: DC | PRN
  Administered 2018-07-30: 20 mL via PERINEURAL

## 2018-07-30 MED ORDER — PROMETHAZINE HCL 25 MG/ML IJ SOLN: 6.2500 mg | INTRAMUSCULAR | Status: DC | PRN

## 2018-07-30 MED ORDER — SODIUM CHLORIDE 0.9% FLUSH
INTRAVENOUS | Status: DC | PRN
  Administered 2018-07-30: 30 mL

## 2018-07-30 MED ORDER — POVIDONE-IODINE 10 % EX SWAB: 2.0000 "application " | Freq: Once | CUTANEOUS | Status: DC

## 2018-07-30 MED ORDER — FENTANYL CITRATE (PF) 100 MCG/2ML IJ SOLN
INTRAMUSCULAR | Status: AC
  Filled 2018-07-30: qty 2

## 2018-07-30 MED ORDER — ISOPROPYL ALCOHOL 70 % SOLN
Status: AC
  Filled 2018-07-30: qty 480

## 2018-07-30 MED ORDER — METOCLOPRAMIDE HCL 5 MG/ML IJ SOLN: 5.0000 mg | Freq: Three times a day (TID) | INTRAMUSCULAR | Status: DC | PRN

## 2018-07-30 MED ORDER — EPHEDRINE SULFATE-NACL 50-0.9 MG/10ML-% IV SOSY
PREFILLED_SYRINGE | INTRAVENOUS | Status: DC | PRN
  Administered 2018-07-30 (×3): 10 mg via INTRAVENOUS

## 2018-07-30 MED ORDER — SENNA 8.6 MG PO TABS
1.0000 | ORAL_TABLET | Freq: Two times a day (BID) | ORAL | Status: DC
  Administered 2018-07-30 – 2018-07-31 (×2): 8.6 mg via ORAL
  Filled 2018-07-30 (×2): qty 1

## 2018-07-30 MED ORDER — DOCUSATE SODIUM 100 MG PO CAPS
100.0000 mg | ORAL_CAPSULE | Freq: Two times a day (BID) | ORAL | Status: DC
  Administered 2018-07-30 – 2018-07-31 (×2): 100 mg via ORAL
  Filled 2018-07-30 (×2): qty 1

## 2018-07-30 MED ORDER — MENTHOL 3 MG MT LOZG: 1.0000 | LOZENGE | OROMUCOSAL | Status: DC | PRN

## 2018-07-30 MED ORDER — HYDROCHLOROTHIAZIDE 25 MG PO TABS: 25.0000 mg | ORAL_TABLET | Freq: Every day | ORAL | Status: DC

## 2018-07-30 MED ORDER — CITALOPRAM HYDROBROMIDE 20 MG PO TABS
40.0000 mg | ORAL_TABLET | Freq: Every day | ORAL | Status: DC
  Administered 2018-07-31: 40 mg via ORAL
  Filled 2018-07-30: qty 2

## 2018-07-30 MED ORDER — ASPIRIN 81 MG PO CHEW
81.0000 mg | CHEWABLE_TABLET | Freq: Two times a day (BID) | ORAL | Status: DC
  Administered 2018-07-30 – 2018-07-31 (×2): 81 mg via ORAL
  Filled 2018-07-30 (×2): qty 1

## 2018-07-30 MED ORDER — METOCLOPRAMIDE HCL 5 MG PO TABS: 5.0000 mg | ORAL_TABLET | Freq: Three times a day (TID) | ORAL | Status: DC | PRN

## 2018-07-30 MED ORDER — CHLORHEXIDINE GLUCONATE 4 % EX LIQD: 60.0000 mL | Freq: Once | CUTANEOUS | Status: DC

## 2018-07-30 MED ORDER — ACETAMINOPHEN 325 MG PO TABS: 325.0000 mg | ORAL_TABLET | Freq: Four times a day (QID) | ORAL | Status: DC | PRN

## 2018-07-30 MED ORDER — HYDROMORPHONE HCL 1 MG/ML IJ SOLN: 0.2500 mg | INTRAMUSCULAR | Status: DC | PRN

## 2018-07-30 MED ORDER — ACETAMINOPHEN 10 MG/ML IV SOLN
1000.0000 mg | INTRAVENOUS | Status: AC
  Administered 2018-07-30: 1000 mg via INTRAVENOUS
  Filled 2018-07-30: qty 100

## 2018-07-30 MED ORDER — LIDOCAINE HCL (CARDIAC) PF 100 MG/5ML IV SOSY
PREFILLED_SYRINGE | INTRAVENOUS | Status: DC | PRN
  Administered 2018-07-30: 60 mg via INTRAVENOUS

## 2018-07-30 MED ORDER — TRANEXAMIC ACID-NACL 1000-0.7 MG/100ML-% IV SOLN
1000.0000 mg | INTRAVENOUS | Status: AC
  Administered 2018-07-30: 1000 mg via INTRAVENOUS
  Filled 2018-07-30: qty 100

## 2018-07-30 MED ORDER — BUPIVACAINE-EPINEPHRINE (PF) 0.25% -1:200000 IJ SOLN
INTRAMUSCULAR | Status: AC
  Filled 2018-07-30: qty 30

## 2018-07-30 MED ORDER — KETOROLAC TROMETHAMINE 30 MG/ML IJ SOLN
INTRAMUSCULAR | Status: DC | PRN
  Administered 2018-07-30: 30 mg via INTRA_ARTICULAR

## 2018-07-30 MED ORDER — POLYETHYLENE GLYCOL 3350 17 G PO PACK: 17.0000 g | PACK | Freq: Every day | ORAL | Status: DC | PRN

## 2018-07-30 MED ORDER — DEXAMETHASONE SODIUM PHOSPHATE 10 MG/ML IJ SOLN
INTRAMUSCULAR | Status: AC
  Filled 2018-07-30: qty 1

## 2018-07-30 MED ORDER — PHENOL 1.4 % MT LIQD: 1.0000 | OROMUCOSAL | Status: DC | PRN

## 2018-07-30 MED ORDER — FENTANYL CITRATE (PF) 100 MCG/2ML IJ SOLN
50.0000 ug | INTRAMUSCULAR | Status: DC
  Administered 2018-07-30: 50 ug via INTRAVENOUS
  Filled 2018-07-30: qty 2

## 2018-07-30 MED ORDER — FENTANYL CITRATE (PF) 100 MCG/2ML IJ SOLN
INTRAMUSCULAR | Status: DC | PRN
  Administered 2018-07-30: 12.5 ug via INTRAVENOUS

## 2018-07-30 MED ORDER — METHOCARBAMOL 500 MG PO TABS
500.0000 mg | ORAL_TABLET | Freq: Four times a day (QID) | ORAL | Status: DC | PRN
  Administered 2018-07-30: 500 mg via ORAL
  Filled 2018-07-30: qty 1

## 2018-07-30 MED ORDER — PROPOFOL 10 MG/ML IV BOLUS
INTRAVENOUS | Status: AC
  Filled 2018-07-30: qty 40

## 2018-07-30 MED ORDER — ONDANSETRON HCL 4 MG/2ML IJ SOLN
INTRAMUSCULAR | Status: AC
  Filled 2018-07-30: qty 2

## 2018-07-30 MED ORDER — METHOCARBAMOL 500 MG IVPB - SIMPLE MED
500.0000 mg | Freq: Four times a day (QID) | INTRAVENOUS | Status: DC | PRN
  Filled 2018-07-30: qty 50

## 2018-07-30 MED ORDER — CLONIDINE HCL (ANALGESIA) 100 MCG/ML EP SOLN
EPIDURAL | Status: DC | PRN
  Administered 2018-07-30: 50 ug

## 2018-07-30 MED ORDER — LACTATED RINGERS IV SOLN
INTRAVENOUS | Status: DC
  Administered 2018-07-30 (×2): via INTRAVENOUS

## 2018-07-30 MED ORDER — KETOROLAC TROMETHAMINE 30 MG/ML IJ SOLN
INTRAMUSCULAR | Status: AC
  Filled 2018-07-30: qty 1

## 2018-07-30 MED ORDER — LOSARTAN POTASSIUM-HCTZ 100-25 MG PO TABS: 0.5000 | ORAL_TABLET | Freq: Every day | ORAL | Status: DC

## 2018-07-30 MED ORDER — BUPIVACAINE-EPINEPHRINE 0.25% -1:200000 IJ SOLN
INTRAMUSCULAR | Status: DC | PRN
  Administered 2018-07-30: 30 mL

## 2018-07-30 MED ORDER — MIDAZOLAM HCL 2 MG/2ML IJ SOLN
1.0000 mg | INTRAMUSCULAR | Status: DC
  Administered 2018-07-30: 1 mg via INTRAVENOUS
  Filled 2018-07-30: qty 2

## 2018-07-30 MED ORDER — CEFAZOLIN SODIUM-DEXTROSE 2-4 GM/100ML-% IV SOLN
2.0000 g | INTRAVENOUS | Status: AC
  Administered 2018-07-30: 2 g via INTRAVENOUS
  Filled 2018-07-30: qty 100

## 2018-07-30 MED ORDER — ONDANSETRON HCL 4 MG/2ML IJ SOLN: 4.0000 mg | Freq: Four times a day (QID) | INTRAMUSCULAR | Status: DC | PRN

## 2018-07-30 MED ORDER — ONDANSETRON HCL 4 MG/2ML IJ SOLN
INTRAMUSCULAR | Status: DC | PRN
  Administered 2018-07-30: 4 mg via INTRAVENOUS

## 2018-07-30 MED ORDER — SODIUM CHLORIDE 0.9 % IR SOLN
Status: DC | PRN
  Administered 2018-07-30: 4000 mL

## 2018-07-30 MED ORDER — HYDROCODONE-ACETAMINOPHEN 7.5-325 MG PO TABS
1.0000 | ORAL_TABLET | ORAL | Status: DC | PRN
  Administered 2018-07-30: 1 via ORAL
  Filled 2018-07-30: qty 1

## 2018-07-30 MED ORDER — PROPOFOL 10 MG/ML IV BOLUS
INTRAVENOUS | Status: AC
  Filled 2018-07-30: qty 20

## 2018-07-30 MED ORDER — POTASSIUM CHLORIDE CRYS ER 20 MEQ PO TBCR
20.0000 meq | EXTENDED_RELEASE_TABLET | Freq: Every day | ORAL | Status: DC
  Administered 2018-07-31: 20 meq via ORAL
  Filled 2018-07-30: qty 1

## 2018-07-30 MED ORDER — NITROGLYCERIN 0.4 MG SL SUBL: 0.4000 mg | SUBLINGUAL_TABLET | SUBLINGUAL | Status: DC | PRN

## 2018-07-30 MED ORDER — SODIUM CHLORIDE 0.9 % IV SOLN: INTRAVENOUS | Status: DC

## 2018-07-30 MED ORDER — DEXAMETHASONE SODIUM PHOSPHATE 10 MG/ML IJ SOLN
10.0000 mg | Freq: Once | INTRAMUSCULAR | Status: AC
  Administered 2018-07-31: 10 mg via INTRAVENOUS
  Filled 2018-07-30: qty 1

## 2018-07-30 MED ORDER — KETOROLAC TROMETHAMINE 15 MG/ML IJ SOLN
7.5000 mg | Freq: Four times a day (QID) | INTRAMUSCULAR | Status: DC
  Administered 2018-07-30 – 2018-07-31 (×3): 7.5 mg via INTRAVENOUS
  Filled 2018-07-30 (×3): qty 1

## 2018-07-30 MED ORDER — CEFAZOLIN SODIUM-DEXTROSE 2-4 GM/100ML-% IV SOLN
2.0000 g | Freq: Four times a day (QID) | INTRAVENOUS | Status: AC
  Administered 2018-07-30 – 2018-07-31 (×2): 2 g via INTRAVENOUS
  Filled 2018-07-30 (×2): qty 100

## 2018-07-30 MED ORDER — PROPOFOL 500 MG/50ML IV EMUL
INTRAVENOUS | Status: DC | PRN
  Administered 2018-07-30: 75 ug/kg/min via INTRAVENOUS

## 2018-07-30 MED ORDER — CARVEDILOL 3.125 MG PO TABS
3.1250 mg | ORAL_TABLET | Freq: Two times a day (BID) | ORAL | Status: DC
  Administered 2018-07-30 – 2018-07-31 (×2): 3.125 mg via ORAL
  Filled 2018-07-30 (×2): qty 1

## 2018-07-30 SURGICAL SUPPLY — 63 items
ADH SKN CLS APL DERMABOND .7 (GAUZE/BANDAGES/DRESSINGS) ×2
BAG SPEC THK2 15X12 ZIP CLS (MISCELLANEOUS)
BAG ZIPLOCK 12X15 (MISCELLANEOUS) IMPLANT
BANDAGE ACE 4X5 VEL STRL LF (GAUZE/BANDAGES/DRESSINGS) ×2 IMPLANT
BANDAGE ACE 6X5 VEL STRL LF (GAUZE/BANDAGES/DRESSINGS) ×2 IMPLANT
BATTERY INSTRU NAVIGATION (MISCELLANEOUS) ×6 IMPLANT
BLADE SAW RECIPROCATING 77.5 (BLADE) ×2 IMPLANT
BSPLAT TIB 6 KN TRITANIUM (Knees) ×1 IMPLANT
CHLORAPREP W/TINT 26ML (MISCELLANEOUS) ×4 IMPLANT
COMPONENT TRI CR RETAIN SZ6 LT (Orthopedic Implant) ×1 IMPLANT
COVER SURGICAL LIGHT HANDLE (MISCELLANEOUS) ×2 IMPLANT
COVER WAND RF STERILE (DRAPES) ×2 IMPLANT
CUFF TOURN SGL QUICK 34 (TOURNIQUET CUFF) ×2
CUFF TRNQT CYL 34X4X40X1 (TOURNIQUET CUFF) ×1 IMPLANT
DECANTER SPIKE VIAL GLASS SM (MISCELLANEOUS) ×2 IMPLANT
DERMABOND ADVANCED (GAUZE/BANDAGES/DRESSINGS) ×2
DERMABOND ADVANCED .7 DNX12 (GAUZE/BANDAGES/DRESSINGS) ×2 IMPLANT
DRAPE SHEET LG 3/4 BI-LAMINATE (DRAPES) ×4 IMPLANT
DRAPE U-SHAPE 47X51 STRL (DRAPES) ×2 IMPLANT
DRSG AQUACEL AG ADV 3.5X10 (GAUZE/BANDAGES/DRESSINGS) ×2 IMPLANT
DRSG TEGADERM 4X4.75 (GAUZE/BANDAGES/DRESSINGS) IMPLANT
ELECT BLADE TIP CTD 4 INCH (ELECTRODE) ×2 IMPLANT
ELECT REM PT RETURN 15FT ADLT (MISCELLANEOUS) ×2 IMPLANT
EVACUATOR 1/8 PVC DRAIN (DRAIN) IMPLANT
GAUZE SPONGE 4X4 12PLY STRL (GAUZE/BANDAGES/DRESSINGS) ×2 IMPLANT
GLOVE BIO SURGEON STRL SZ8.5 (GLOVE) ×16 IMPLANT
GLOVE BIOGEL PI IND STRL 8.5 (GLOVE) ×1 IMPLANT
GLOVE BIOGEL PI INDICATOR 8.5 (GLOVE) ×1
GOWN SPEC L3 XXLG W/TWL (GOWN DISPOSABLE) ×8 IMPLANT
HANDPIECE INTERPULSE COAX TIP (DISPOSABLE) ×1
HOOD PEEL AWAY FLYTE STAYCOOL (MISCELLANEOUS) ×8 IMPLANT
INSERT KNEE TIB BRG SZ 6 (Insert) ×2 IMPLANT
KNEE TIBIAL COMPONENT SZ6 (Knees) ×2 IMPLANT
MARKER SKIN DUAL TIP RULER LAB (MISCELLANEOUS) ×2 IMPLANT
NEEDLE SPNL 18GX3.5 QUINCKE PK (NEEDLE) ×2 IMPLANT
NS IRRIG 1000ML POUR BTL (IV SOLUTION) ×4 IMPLANT
PACK TOTAL KNEE CUSTOM (KITS) ×2 IMPLANT
PADDING CAST COTTON 6X4 STRL (CAST SUPPLIES) ×2 IMPLANT
PATELLA ASYMMETRIC 38X11 KNEE (Orthopedic Implant) ×2 IMPLANT
POSITIONER SURGICAL ARM (MISCELLANEOUS) ×2 IMPLANT
SAW OSC TIP CART 19.5X105X1.3 (SAW) ×2 IMPLANT
SEALER BIPOLAR AQUA 6.0 (INSTRUMENTS) ×2 IMPLANT
SET HNDPC FAN SPRY TIP SCT (DISPOSABLE) ×1 IMPLANT
SET PAD KNEE POSITIONER (MISCELLANEOUS) ×2 IMPLANT
SPONGE DRAIN TRACH 4X4 STRL 2S (GAUZE/BANDAGES/DRESSINGS) IMPLANT
SPONGE LAP 18X18 RF (DISPOSABLE) IMPLANT
SUT MNCRL AB 3-0 PS2 18 (SUTURE) ×2 IMPLANT
SUT MNCRL AB 4-0 PS2 18 (SUTURE) ×2 IMPLANT
SUT MON AB 2-0 CT1 36 (SUTURE) ×6 IMPLANT
SUT STRATAFIX PDO 1 14 VIOLET (SUTURE)
SUT STRATFX PDO 1 14 VIOLET (SUTURE)
SUT VIC AB 1 CTX 36 (SUTURE) ×4
SUT VIC AB 1 CTX36XBRD ANBCTR (SUTURE) ×2 IMPLANT
SUT VIC AB 2-0 CT1 27 (SUTURE) ×2
SUT VIC AB 2-0 CT1 TAPERPNT 27 (SUTURE) ×1 IMPLANT
SUTURE STRATFX PDO 1 14 VIOLET (SUTURE) IMPLANT
SYR 50ML LL SCALE MARK (SYRINGE) ×2 IMPLANT
TOWER CARTRIDGE SMART MIX (DISPOSABLE) IMPLANT
TRAY FOLEY MTR SLVR 16FR STAT (SET/KITS/TRAYS/PACK) ×2 IMPLANT
TRIATH CRUCIATE RETAIN SZ6 KNE (Orthopedic Implant) ×2 IMPLANT
WATER STERILE IRR 1000ML POUR (IV SOLUTION) ×4 IMPLANT
WRAP KNEE MAXI GEL POST OP (GAUZE/BANDAGES/DRESSINGS) ×2 IMPLANT
YANKAUER SUCT BULB TIP 10FT TU (MISCELLANEOUS) ×2 IMPLANT

## 2018-07-30 NOTE — Discharge Instructions (Signed)
° °Dr. Perlie Scheuring °Total Joint Specialist °[facility] °[address] °[place], [place] [address] °[phone] ° °TOTAL KNEE REPLACEMENT POSTOPERATIVE DIRECTIONS ° ° ° °Knee Rehabilitation, Guidelines Following Surgery  °Results after knee surgery are often greatly improved when you follow the exercise, range of motion and muscle strengthening exercises prescribed by your doctor. Safety measures are also important to protect the knee from further injury. Any time any of these exercises cause you to have increased pain or swelling in your knee joint, decrease the amount until you are comfortable again and slowly increase them. If you have problems or questions, call your caregiver or physical therapist for advice.  ° °WEIGHT BEARING °Weight bearing as tolerated with assist device (walker, cane, etc) as directed, use it as long as suggested by your surgeon or therapist, typically at least 4-6 weeks. ° °HOME CARE INSTRUCTIONS  °Remove items at home which could result in a fall. This includes throw rugs or furniture in walking pathways.  °Continue medications as instructed at time of discharge. °You may have some home medications which will be placed on hold until you complete the course of blood thinner medication.  °You may start showering once you are discharged home but do not submerge the incision under water. Just pat the incision dry and apply a dry gauze dressing on daily. °Walk with walker as instructed.  °You may resume a sexual relationship in one month or when given the OK by your doctor.  °· Use walker as long as suggested by your caregivers. °· Avoid periods of inactivity such as sitting longer than an hour when not asleep. This helps prevent blood clots.  °You may put full weight on your legs and walk as much as is comfortable.  °You may return to work once you are cleared by your doctor.  °Do not drive a car for 6 weeks or until released by you surgeon.  °· Do not drive  while taking narcotics.  °Wear the elastic stockings for three weeks following surgery during the day but you may remove then at night. °Make sure you keep all of your appointments after your operation with all of your doctors and caregivers. You should call the office at the above phone number and make an appointment for approximately two weeks after the date of your surgery. °Do not remove your surgical dressing. The dressing is waterproof; you may take showers in 3 days, but do not take tub baths or submerge the dressing. °Please pick up a stool softener and laxative for home use as long as you are requiring pain medications. °· ICE to the affected knee every three hours for 30 minutes at a time and then as needed for pain and swelling.  Continue to use ice on the knee for pain and swelling from surgery. You may notice swelling that will progress down to the foot and ankle.  This is normal after surgery.  Elevate the leg when you are not up walking on it.   °It is important for you to complete the blood thinner medication as prescribed by your doctor. °· Continue to use the breathing machine which will help keep your temperature down.  It is common for your temperature to cycle up and down following surgery, especially at night when you are not up moving around and exerting yourself.  The breathing machine keeps your lungs expanded and your temperature down. ° °RANGE OF MOTION AND STRENGTHENING EXERCISES  °Rehabilitation of the knee is important following   a knee injury or an operation. After just a few days of immobilization, the muscles of the thigh which control the knee become weakened and shrink (atrophy). Knee exercises are designed to build up the tone and strength of the thigh muscles and to improve knee motion. Often times heat used for twenty to thirty minutes before working out will loosen up your tissues and help with improving the range of motion but do not use heat for the first two weeks following  surgery. These exercises can be done on a training (exercise) mat, on the floor, on a table or on a bed. Use what ever works the best and is most comfortable for you Knee exercises include:  °Leg Lifts - While your knee is still immobilized in a splint or cast, you can do straight leg raises. Lift the leg to 60 degrees, hold for 3 sec, and slowly lower the leg. Repeat 10-20 times 2-3 times daily. Perform this exercise against resistance later as your knee gets better.  °Quad and Hamstring Sets - Tighten up the muscle on the front of the thigh (Quad) and hold for 5-10 sec. Repeat this 10-20 times hourly. Hamstring sets are done by pushing the foot backward against an object and holding for 5-10 sec. Repeat as with quad sets.  °A rehabilitation program following serious knee injuries can speed recovery and prevent re-injury in the future due to weakened muscles. Contact your doctor or a physical therapist for more information on knee rehabilitation.  ° °SKILLED REHAB INSTRUCTIONS: °If the patient is transferred to a skilled rehab facility following release from the hospital, a list of the current medications will be sent to the facility for the patient to continue.  When discharged from the skilled rehab facility, please have the facility set up the patient's Home Health Physical Therapy prior to being released. Also, the skilled facility will be responsible for providing the patient with their medications at time of release from the facility to include their pain medication, the muscle relaxants, and their blood thinner medication. If the patient is still at the rehab facility at time of the two week follow up appointment, the skilled rehab facility will also need to assist the patient in arranging follow up appointment in our office and any transportation needs. ° °MAKE SURE YOU:  °Understand these instructions.  °Will watch your condition.  °Will get help right away if you are not doing well or get worse.   ° ° °Pick up stool softner and laxative for home use following surgery while on pain medications. °Do NOT remove your dressing. You may shower.  °Do not take tub baths or submerge incision under water. °May shower starting three days after surgery. °Please use a clean towel to pat the incision dry following showers. °Continue to use ice for pain and swelling after surgery. °Do not use any lotions or creams on the incision until instructed by your surgeon. ° °

## 2018-07-30 NOTE — Anesthesia Postprocedure Evaluation (Signed)
Anesthesia Post Note  Patient: Stephen Graves  Procedure(s) Performed: LEFT TOTAL KNEE ARTHROPLASTY WITH COMPUTER NAVIGATION (Left Knee)     Patient location during evaluation: PACU Anesthesia Type: Spinal Level of consciousness: oriented and awake and alert Pain management: pain level controlled Vital Signs Assessment: post-procedure vital signs reviewed and stable Respiratory status: spontaneous breathing, respiratory function stable and patient connected to nasal cannula oxygen Cardiovascular status: blood pressure returned to baseline and stable Postop Assessment: no headache, no backache and no apparent nausea or vomiting Anesthetic complications: no    Last Vitals:  Vitals:   07/30/18 1645 07/30/18 1700  BP: (!) 177/93 (!) 174/92  Pulse: (!) 50 (!) 55  Resp: 14 14  Temp:  (!) 36.4 C  SpO2: 100% 100%    Last Pain:  Vitals:   07/30/18 1700  TempSrc:   PainSc: 0-No pain                 Myran Arcia S

## 2018-07-30 NOTE — Progress Notes (Signed)
Assisted Dr. Kalman Shan  with left knee adductor canal block. Side rails up, monitors on throughout procedure. See vital signs in flow sheet. Tolerated Procedure well.

## 2018-07-30 NOTE — Anesthesia Procedure Notes (Signed)
Procedure Name: MAC Date/Time: 07/30/2018 1:30 PM Performed by: West Pugh, CRNA Pre-anesthesia Checklist: Patient identified, Emergency Drugs available, Suction available, Patient being monitored and Timeout performed Patient Re-evaluated:Patient Re-evaluated prior to induction Oxygen Delivery Method: Simple face mask Preoxygenation: Pre-oxygenation with 100% oxygen Induction Type: IV induction Number of attempts: 1 Placement Confirmation: positive ETCO2 Dental Injury: Teeth and Oropharynx as per pre-operative assessment

## 2018-07-30 NOTE — Significant Event (Signed)
Rapid Response Event Note  Overview: Time Called: 2044 Arrival Time: 2050 Event Type: Hypotension  Initial Focused Assessment: RN contacted Rapid Response regarding change in patient's BP trend. At 1817 patient's BP was 183/93 and then when BP was checked at 1959 it was 90/58, then shortly after at 2018 BP was 85/58. Patient received Coreg & Norco at [place]. Patient states his BP sometimes runs at 90s/60s at home. Patient is alert and oriented. HR 57 bpm, checked via monitor and radial pulse counted. Patient skin warm and dry. Patient currently sitting up in chair, I recommend patient be assisted by two staff members when getting back to bed and to dangle prior to standing.  Interventions: 500 cc bolus administered, per MD order.   Plan of Care (if not transferred): Patient appears stable at this time. MD on call updated regarding my assessment, no further orders at this time. MD recommends patient not getting up out of bed for the rest of the night. Primary RN made aware of MD recommendations.   Event Summary:   Casimer Bilis

## 2018-07-30 NOTE — Anesthesia Preprocedure Evaluation (Signed)
Anesthesia Evaluation  Patient identified by MRN, date of birth, ID band Patient awake    Reviewed: Allergy & Precautions, NPO status , Patient's Chart, lab work & pertinent test results  Airway Mallampati: II  TM Distance: >3 FB Neck ROM: Full    Dental no notable dental hx.    Pulmonary neg pulmonary ROS, former smoker,    Pulmonary exam normal breath sounds clear to auscultation       Cardiovascular hypertension, + CAD and + Cardiac Stents  Normal cardiovascular exam Rhythm:Regular Rate:Normal     Neuro/Psych negative neurological ROS  negative psych ROS   GI/Hepatic negative GI ROS, Neg liver ROS,   Endo/Other  negative endocrine ROS  Renal/GU negative Renal ROS  negative genitourinary   Musculoskeletal negative musculoskeletal ROS (+)   Abdominal   Peds negative pediatric ROS (+)  Hematology negative hematology ROS (+)   Anesthesia Other Findings   Reproductive/Obstetrics negative OB ROS                             Anesthesia Physical Anesthesia Plan  ASA: III  Anesthesia Plan: Spinal   Post-op Pain Management:  Regional for Post-op pain   Induction: Intravenous  PONV Risk Score and Plan: 1 and Ondansetron  Airway Management Planned: Simple Face Mask  Additional Equipment:   Intra-op Plan:   Post-operative Plan:   Informed Consent: I have reviewed the patients History and Physical, chart, labs and discussed the procedure including the risks, benefits and alternatives for the proposed anesthesia with the patient or authorized representative who has indicated his/her understanding and acceptance.   Dental advisory given  Plan Discussed with: CRNA and Surgeon  Anesthesia Plan Comments:         Anesthesia Quick Evaluation

## 2018-07-30 NOTE — Anesthesia Procedure Notes (Signed)
Anesthesia Procedure Image    

## 2018-07-30 NOTE — Interval H&P Note (Signed)
History and Physical Interval Note:  07/30/2018 1:19 PM  Stephen Graves  has presented today for surgery, with the diagnosis of Degenerative joint disease left knee  The various methods of treatment have been discussed with the patient and family. After consideration of risks, benefits and other options for treatment, the patient has consented to  Procedure(s) with comments: LEFT TOTAL KNEE ARTHROPLASTY WITH COMPUTER NAVIGATION (Left) - Needs RNFA as a surgical intervention .  The patient's history has been reviewed, patient examined, no change in status, stable for surgery.  I have reviewed the patient's chart and labs.  Questions were answered to the patient's satisfaction.    The risks, benefits, and alternatives were discussed with the patient. There are risks associated with the surgery including, but not limited to, problems with anesthesia (death), infection, instability (giving out of the joint), dislocation, differences in leg length/angulation/rotation, fracture of bones, loosening or failure of implants, hematoma (blood accumulation) which may require surgical drainage, blood clots, pulmonary embolism, nerve injury (foot drop and lateral thigh numbness), and blood vessel injury. The patient understands these risks and elects to proceed.    Hilton Cork Shylah Dossantos

## 2018-07-30 NOTE — Op Note (Signed)
OPERATIVE REPORT  SURGEON: Rod Can, MD   ASSISTANT: Nehemiah Massed, PA-C.  PREOPERATIVE DIAGNOSIS: Left knee arthritis.   POSTOPERATIVE DIAGNOSIS: Left knee arthritis.   PROCEDURE: Left total knee arthroplasty.   IMPLANTS: Stryker Triathlon CR femur, size 6. Stryker Tritanium tibia, size 6. X3 polyethelyene insert, size 9 mm, CR. 3 button asymmetric patella, size 38 mm.  ANESTHESIA:  MAC, Regional and Spinal  TOURNIQUET TIME: Not utilized.   ESTIMATED BLOOD LOSS:-200 mL    ANTIBIOTICS: 2 g Ancef.  DRAINS: None.  COMPLICATIONS: None   CONDITION: PACU - hemodynamically stable.   BRIEF CLINICAL NOTE: Stephen Graves is a 62 y.o. male with a long-standing history of Left knee arthritis. After failing conservative management, the patient was indicated for total knee arthroplasty. The risks, benefits, and alternatives to the procedure were explained, and the patient elected to proceed.  PROCEDURE IN DETAIL: Adductor canal block was obtained in the pre-op holding area. Once inside the operative room, spinal anesthesia was obtained, and a foley catheter was inserted. The patient was then positioned, a nonsterile tourniquet was placed, and the lower extremity was prepped and draped in the normal sterile surgical fashion. A time-out was called verifying side and site of surgery. The patient received IV antibiotics within 60 minutes of beginning the procedure. The tourniquet was not utilized.  An anterior approach to the knee was performed utilizing a midvastus arthrotomy. A medial release was performed and the patellar fat pad was excised. Stryker navigation was used to cut the distal femur perpendicular to the mechanical axis. A freehand patellar resection was performed, and the patella was sized an prepared with 3 lug holes.  Nagivation was used to make a neutral proximal  tibia resection, taking 9 mm of bone from the less affected lateral side with 3 degrees of slope. The menisci were excised. A spacer block was placed, and the alignment and balance in extension were confirmed.   The distal femur was sized using the 3-degree external rotation guide referencing the posterior femoral cortex. The appropriate 4-in-1 cutting block was pinned into place. Rotation was checked using Whiteside's line, the epicondylar axis, and then confirmed with a spacer block in flexion. The remaining femoral cuts were performed, taking care to protect the MCL.  The tibia was sized and the trial tray was pinned into place. The remaining trail components were inserted. The knee was stable to varus and valgus stress through a full range of motion. The patella tracked centrally, and the PCL was well balanced. The trial components were removed, and the proximal tibial surface was prepared. Final components were impacted into place. The knee was tested for a final time and found to be well balanced.  The wound was copiously irrigated with normal saline with pulse lavage. Marcaine solution was injected into the periarticular soft tissue. The wound was closed in layers using #1 Vicryl and Stratafix for the fascia, 2-0 Vicryl for the subcutaneous fat, 2-0 Monocryl for the deep dermal layer, 3-0 running Monocryl subcuticular Stitch, and Dermabond for the skin. Once the glue was fully dried, an Aquacell Ag and compressive dressing were applied. Tthe patient was transported to the recovery room in stable condition. Sponge, needle, and instrument counts were correct at the end of the case x2. The patient tolerated the procedure well and there were no known complications.  Please note that a surgical assistant was a medical necessity for this procedure in order to perform it in a safe and expeditious manner. Surgical assistant was  necessary to retract the ligaments and vital neurovascular structures to  prevent injury to them and also necessary for proper positioning of the limb to allow for anatomic placement of the prosthesis.

## 2018-07-30 NOTE — Transfer of Care (Signed)
Immediate Anesthesia Transfer of Care Note  Patient: Stephen Graves  Procedure(s) Performed: LEFT TOTAL KNEE ARTHROPLASTY WITH COMPUTER NAVIGATION (Left Knee)  Patient Location: PACU  Anesthesia Type:Spinal and MAC combined with regional for post-op pain  Level of Consciousness: sedated and patient cooperative  Airway & Oxygen Therapy: Patient Spontanous Breathing and Patient connected to face mask oxygen  Post-op Assessment: Report given to RN and Post -op Vital signs reviewed and stable  Post vital signs: Reviewed and stable  Last Vitals:  Vitals Value Taken Time  BP    Temp    Pulse    Resp    SpO2      Last Pain:  Vitals:   07/30/18 1142  TempSrc: Oral  PainSc: 0-No pain         Complications: No apparent anesthesia complications

## 2018-07-30 NOTE — Evaluation (Signed)
Physical Therapy Evaluation Patient Details Name: Stephen Graves MRN: [id number] DOB: 12/05/1955 Today's Date: 07/30/2018   History of Present Illness  62 yo male s/p L TKR on 07/30/18. PMH includes CAD, acute coronary syndrome with unstable angina 2018, HLD, HTN, L foot surgery, L heart cath and coronary angiography 2018, coronary stent placement.   Clinical Impression   Pt presents with mild L knee pain with mobility, decreased L knee ROM, difficulty performing mobility tasks, and decreased tolerance for ambulation. Pt to benefit from acute PT to address deficits. Pt ambulated 50 ft with RW with min guard assist, requires multiple VCs during session. Pt's plan is to have OPPT. PT to progress mobility as tolerated, and will continue to follow acutely.   Of note, pt states Dr. Lyla Glassing stated he cannot sleep on his side because he has to keep his "toes above his nose". Pt is concerned about this because that is how he sleeps. RN notified, RN states she will follow up with MD tomorrow am.     Follow Up Recommendations Follow surgeon's recommendation for DC plan and follow-up therapies;Supervision for mobility/OOB    Equipment Recommendations  None recommended by PT    Recommendations for Other Services       Precautions / Restrictions Precautions Precautions: Fall Restrictions Weight Bearing Restrictions: No Other Position/Activity Restrictions: WBAT       Mobility  Bed Mobility Overal bed mobility: Needs Assistance Bed Mobility: Supine to Sit     Supine to sit: HOB elevated;Min assist     General bed mobility comments: Min assist for LLE management. VC for sequencing.   Transfers Overall transfer level: Needs assistance Equipment used: Rolling walker (2 wheeled) Transfers: Sit to/from Stand Sit to Stand: Min guard;From elevated surface         General transfer comment: Min guard for safety. Verbal cuing for hand placement x2.   Ambulation/Gait Ambulation/Gait  assistance: Min guard Gait Distance (Feet): 50 Feet Assistive device: Rolling walker (2 wheeled) Gait Pattern/deviations: Step-to pattern;Decreased stride length Gait velocity: decr    General Gait Details: min guard for safety. frequent verbal cuing for placement in RW, sequencing.   Stairs            Wheelchair Mobility    Modified Rankin (Stroke Patients Only)       Balance Overall balance assessment: Mild deficits observed, not formally tested                                           Pertinent Vitals/Pain Pain Assessment: 0-10 Pain Score: 2  Pain Location: L knee  Pain Descriptors / Indicators: Sore Pain Intervention(s): Limited activity within patient's tolerance;Repositioned;Monitored during session    Home Living Family/patient expects to be discharged to:: Private residence Living Arrangements: Other relatives(going to stay with sister; information below in regards to sister's home) Available Help at Discharge: Family;Available 24 hours/day Type of Home: House Home Access: Stairs to enter Entrance Stairs-Rails: Left;Right("one lower and one higher") Entrance Stairs-Number of Steps: 5 Home Layout: One level Home Equipment: Walker - 2 wheels;Cane - single point      Prior Function Level of Independence: Independent with assistive device(s)         Comments: used cane for ambulation      Hand Dominance   Dominant Hand: Right    Extremity/Trunk Assessment   Upper Extremity Assessment Upper Extremity Assessment: Overall  WFL for tasks assessed    Lower Extremity Assessment Lower Extremity Assessment: Overall WFL for tasks assessed;LLE deficits/detail LLE Deficits / Details: Suspected post-surgical weakness; able to perform ankle pumps, SLR with assist, quad sets  LLE Sensation: WNL    Cervical / Trunk Assessment Cervical / Trunk Assessment: Normal  Communication   Communication: No difficulties  Cognition  Arousal/Alertness: Awake/alert Behavior During Therapy: WFL for tasks assessed/performed Overall Cognitive Status: Within Functional Limits for tasks assessed                                 General Comments: Increased time for processing       General Comments      Exercises Total Joint Exercises Ankle Circles/Pumps: AROM;Both;20 reps;Seated Goniometric ROM: Knee ROM ~5-80*, limited by pain    Assessment/Plan    PT Assessment Patient needs continued PT services  PT Problem List Decreased strength;Pain;Decreased range of motion;Decreased activity tolerance;Decreased knowledge of use of DME;Decreased balance;Decreased mobility       PT Treatment Interventions DME instruction;Therapeutic activities;Therapeutic exercise;Patient/family education;Gait training;Stair training;Balance training;Functional mobility training    PT Goals (Current goals can be found in the Care Plan section)  Acute Rehab PT Goals PT Goal Formulation: With patient Time For Goal Achievement: 08/06/18 Potential to Achieve Goals: Good    Frequency 7X/week   Barriers to discharge        Co-evaluation               AM-PAC PT "6 Clicks" Daily Activity  Outcome Measure Difficulty turning over in bed (including adjusting bedclothes, sheets and blankets)?: Unable Difficulty moving from lying on back to sitting on the side of the bed? : Unable Difficulty sitting down on and standing up from a chair with arms (e.g., wheelchair, bedside commode, etc,.)?: Unable Help needed moving to and from a bed to chair (including a wheelchair)?: None Help needed walking in hospital room?: A Little Help needed climbing 3-5 steps with a railing? : A Little 6 Click Score: 13    End of Session Equipment Utilized During Treatment: Gait belt Activity Tolerance: Patient tolerated treatment well Patient left: in chair;with chair alarm set;with call bell/phone within reach;with family/visitor present;with  SCD's reapplied Nurse Communication: Mobility status PT Visit Diagnosis: Other abnormalities of gait and mobility (R26.89);Difficulty in walking, not elsewhere classified (R26.2)    Time: 1850-1920 PT Time Calculation (min) (ACUTE ONLY): 30 min   Charges:   PT Evaluation $PT Eval Low Complexity: 1 Low PT Treatments $Gait Training: 8-22 mins        Julien Girt, PT Acute Rehabilitation Services Pager [phone]  Office [phone]   Jara Feider D Elonda Husky 07/30/2018, 7:51 PM

## 2018-07-30 NOTE — Anesthesia Procedure Notes (Signed)
Anesthesia Regional Block: Adductor canal block   Pre-Anesthetic Checklist: ,, timeout performed, Correct Patient, Correct Site, Correct Laterality, Correct Procedure, Correct Position, site marked, Risks and benefits discussed,  Surgical consent,  Pre-op evaluation,  At surgeon's request and post-op pain management  Laterality: Left  Prep: chloraprep       Needles:  Injection technique: Single-shot  Needle Type: Echogenic Needle     Needle Length: 9cm      Additional Needles:   Procedures:,,,, ultrasound used (permanent image in chart),,,,  Narrative:  Start time: 07/30/2018 1:12 PM End time: 07/30/2018 1:20 PM Injection made incrementally with aspirations every 5 mL.  Performed by: Personally   Additional Notes: Patient tolerated the procedure well without complications

## 2018-07-31 ENCOUNTER — Encounter (HOSPITAL_COMMUNITY): Payer: Self-pay | Admitting: Orthopedic Surgery

## 2018-07-31 LAB — BASIC METABOLIC PANEL
Anion gap: 6 (ref 5–15)
BUN: 17 mg/dL (ref 8–23)
CALCIUM: 8.1 mg/dL — AB (ref 8.9–10.3)
CO2: 28 mmol/L (ref 22–32)
CREATININE: 1.04 mg/dL (ref 0.61–1.24)
Chloride: 104 mmol/L (ref 98–111)
Glucose, Bld: 107 mg/dL — ABNORMAL HIGH (ref 70–99)
Potassium: 3.7 mmol/L (ref 3.5–5.1)
SODIUM: 138 mmol/L (ref 135–145)

## 2018-07-31 LAB — CBC
HCT: 34.7 % — ABNORMAL LOW (ref 39.0–52.0)
HEMOGLOBIN: 11.3 g/dL — AB (ref 13.0–17.0)
MCH: 32.4 pg (ref 26.0–34.0)
MCHC: 32.6 g/dL (ref 30.0–36.0)
MCV: 99.4 fL (ref 80.0–100.0)
PLATELETS: 80 10*3/uL — AB (ref 150–400)
RBC: 3.49 MIL/uL — ABNORMAL LOW (ref 4.22–5.81)
RDW: 12.4 % (ref 11.5–15.5)
WBC: 5.3 10*3/uL (ref 4.0–10.5)
nRBC: 0 % (ref 0.0–0.2)

## 2018-07-31 MED ORDER — DOCUSATE SODIUM 100 MG PO CAPS: 100.0000 mg | ORAL_CAPSULE | Freq: Two times a day (BID) | ORAL | 1 refills | Status: DC

## 2018-07-31 MED ORDER — HYDROCODONE-ACETAMINOPHEN 5-325 MG PO TABS: 1.0000 | ORAL_TABLET | Freq: Four times a day (QID) | ORAL | 0 refills | Status: DC | PRN

## 2018-07-31 MED ORDER — SENNA 8.6 MG PO TABS: 1.0000 | ORAL_TABLET | Freq: Two times a day (BID) | ORAL | 0 refills | Status: DC

## 2018-07-31 MED ORDER — ONDANSETRON HCL 4 MG PO TABS: 4.0000 mg | ORAL_TABLET | Freq: Four times a day (QID) | ORAL | 0 refills | Status: DC | PRN

## 2018-07-31 MED ORDER — ASPIRIN 81 MG PO CHEW: 81.0000 mg | CHEWABLE_TABLET | Freq: Two times a day (BID) | ORAL | 1 refills | Status: DC

## 2018-07-31 NOTE — Progress Notes (Signed)
Pts BP sitting in the chair after working with PT was 90/58     P 49 @ 19:59. Rechecked @ 20:18 BP 85/58 P 45 in L arm & in R arm BP 95/63 P 47. Pt alert & oriented, denies any lightheadedness/ dizziness, denies any chest pain, skin warm & dry. Pt complains of feeling a little groggy but thinks it is from the pain medication. Called [facility] answering service. Received a call back from Dr. Trenton Gammon. New orders to give 500 ml NS bolus and call rapid response nurse & have them come evaluate pt & give him a call back with their findings.

## 2018-07-31 NOTE — Progress Notes (Addendum)
    Subjective:  Patient reports pain as mild to moderate.  Denies N/V/CP/SOB. Hypotension o/n - responded to NS bolus. Feels fine now.  Objective:   VITALS:   Vitals:   07/30/18 2317 07/31/18 0137 07/31/18 0508 07/31/18 0744  BP: 129/77 (!) 147/86 138/89 (!) 142/86  Pulse: (!) 54 66 (!) 53 (!) 59  Resp: 16 14 15    Temp:  98.4 F (36.9 C) 98.5 F (36.9 C)   TempSrc:  Oral Oral   SpO2: 98% 100% 99%   Weight:      Height:        NAD ABD soft Sensation intact distally Intact pulses distally Dorsiflexion/Plantar flexion intact Incision: dressing C/D/I Compartment soft Able to SLR  Lab Results  Component Value Date   WBC 5.3 07/31/2018   HGB 11.3 (L) 07/31/2018   HCT 34.7 (L) 07/31/2018   MCV 99.4 07/31/2018   PLT 80 (L) 07/31/2018   BMET    Component Value Date/Time   NA 138 07/31/2018 0550   K 3.7 07/31/2018 0550   CL 104 07/31/2018 0550   CO2 28 07/31/2018 0550   GLUCOSE 107 (H) 07/31/2018 0550   BUN 17 07/31/2018 0550   CREATININE 1.04 07/31/2018 0550   CALCIUM 8.1 (L) 07/31/2018 0550   GFRNONAA >60 07/31/2018 0550   GFRAA >60 07/31/2018 0550    Patient's anticipated LOS is less than 2 midnights, meeting these requirements: - Younger than 76 - Lives within 1 hour of care - Has a competent adult at home to recover with post-op recover - NO history of  - Chronic pain requiring opiods  - Diabetes  - Heart failure  - Stroke  - DVT/VTE  - Cardiac arrhythmia  - Respiratory Failure/COPD  - Renal failure  - Anemia  - Advanced Liver disease     Assessment/Plan: 1 Day Post-Op   Principal Problem:   Osteoarthritis of left knee   WBAT with walker DVT ppx: Aspirin, SCDs, TEDS PO pain control PT/OT Dispo: D/C home with outpatient PT   Hilton Cork Kiya Eno 07/31/2018, 9:21 AM   Rod Can, MD Cell: [phone] [place] [facility] now [facility]  [place] [address], [address], [place], [place] [address] Phone:  [phone] [email or web address] Facebook  [employer]

## 2018-07-31 NOTE — Progress Notes (Signed)
Physical Therapy Treatment Patient Details Name: Stephen Graves MRN: [id number] DOB: December 02, 1955 Today's Date: 07/31/2018    History of Present Illness 62 yo male s/p L TKR on 07/30/18. PMH includes CAD, acute coronary syndrome with unstable angina 2018, HLD, HTN, L foot surgery, L heart cath and coronary angiography 2018, coronary stent placement.     PT Comments    Pt with increased ambulation distance this session, progressing to step-through gait and working on appropriate forward progression of L foot. Pt with tendency to ER LLE. PT to see pt for second session to continue to work on LE exercises, ambulation, and stair training.    Follow Up Recommendations  Follow surgeon's recommendation for DC plan and follow-up therapies;Supervision for mobility/OOB     Equipment Recommendations  None recommended by PT    Recommendations for Other Services       Precautions / Restrictions Precautions Precautions: Fall Restrictions Weight Bearing Restrictions: No Other Position/Activity Restrictions: WBAT     Mobility  Bed Mobility Overal bed mobility: Needs Assistance Bed Mobility: Supine to Sit     Supine to sit: HOB elevated;Min assist     General bed mobility comments: Min assist for LLE management, pt fearful of PT letting leg go. VC for sequencing.   Transfers Overall transfer level: Needs assistance Equipment used: Rolling walker (2 wheeled) Transfers: Sit to/from Stand Sit to Stand: Min guard;From elevated surface         General transfer comment: Min guard for safety. Verbal cuing for hand placement x2.   Ambulation/Gait Ambulation/Gait assistance: Min guard Gait Distance (Feet): 75 Feet Assistive device: Rolling walker (2 wheeled) Gait Pattern/deviations: Step-to pattern;Decreased stride length;Step-through pattern;Antalgic Gait velocity: decr    General Gait Details: min guard for safety. verbal cuing for step through gait, with heel-to-toe ambulation,  as well as sequencing, placement in RW, keeping RW close to him when turning and when transferring from stand to sit.    Stairs Stairs: Yes Stairs assistance: Min guard Stair Management: Two rails Number of Stairs: 4 General stair comments: practiced stairs with two rails, verbal cuing for sequencing. PT to re-practice stairs this afternoon with cane use.    Wheelchair Mobility    Modified Rankin (Stroke Patients Only)       Balance Overall balance assessment: Mild deficits observed, not formally tested                                          Cognition Arousal/Alertness: Awake/alert Behavior During Therapy: WFL for tasks assessed/performed Overall Cognitive Status: Within Functional Limits for tasks assessed                                 General Comments: some decreased safety awareness; requires verbal cuing to correct       Exercises Total Joint Exercises Ankle Circles/Pumps: AROM;Both;20 reps;Seated Quad Sets: AROM;Left;10 reps;Seated Heel Slides: AAROM;Left;10 reps;Seated Hip ABduction/ADduction: AROM;Left;10 reps;Seated Straight Leg Raises: AAROM;Left;10 reps;Seated Goniometric ROM: Knee ROM ~5-80*, limited by pain    General Comments        Pertinent Vitals/Pain Pain Assessment: 0-10 Pain Score: 4  Pain Location: L knee, with mobility  Pain Descriptors / Indicators: Sore Pain Intervention(s): Limited activity within patient's tolerance;Monitored during session;Repositioned;Ice applied    Home Living  Prior Function            PT Goals (current goals can now be found in the care plan section) Acute Rehab PT Goals PT Goal Formulation: With patient Time For Goal Achievement: 08/06/18 Potential to Achieve Goals: Good Progress towards PT goals: Progressing toward goals    Frequency    7X/week      PT Plan Current plan remains appropriate    Co-evaluation               AM-PAC PT "6 Clicks" Daily Activity  Outcome Measure  Difficulty turning over in bed (including adjusting bedclothes, sheets and blankets)?: Unable Difficulty moving from lying on back to sitting on the side of the bed? : Unable Difficulty sitting down on and standing up from a chair with arms (e.g., wheelchair, bedside commode, etc,.)?: Unable Help needed moving to and from a bed to chair (including a wheelchair)?: None Help needed walking in hospital room?: A Little Help needed climbing 3-5 steps with a railing? : A Little 6 Click Score: 13    End of Session Equipment Utilized During Treatment: Gait belt Activity Tolerance: Patient tolerated treatment well Patient left: in chair;with chair alarm set;with call bell/phone within reach;with SCD's reapplied Nurse Communication: Mobility status PT Visit Diagnosis: Other abnormalities of gait and mobility (R26.89);Difficulty in walking, not elsewhere classified (R26.2)     Time: 9292-4462 PT Time Calculation (min) (ACUTE ONLY): 33 min  Charges:  $Gait Training: 8-22 mins $Therapeutic Exercise: 8-22 mins                     Julien Girt, PT Acute Rehabilitation Services Pager [phone]  Office [phone]    Beula Joyner D Ahaana Rochette 07/31/2018, 11:39 AM

## 2018-07-31 NOTE — Discharge Summary (Signed)
Physician Discharge Summary  Patient ID: Stephen Graves MRN: [id number] DOB/AGE: 1956/01/21 62 y.o.  Admit date: 07/30/2018 Discharge date: 07/31/2018  Admission Diagnoses:  Osteoarthritis of left knee  Discharge Diagnoses:  Principal Problem:   Osteoarthritis of left knee   Past Medical History:  Diagnosis Date  . Anxiety   . Coronary artery disease involving native coronary 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 ([place]) 07/18/2017   Cardiac cath 1020 1418: Mid LAD to Dist LAD lesion, 99 %stenosed. - PCI with resolute DES (2.5 X 26 mm Resolute Onyx stent - 2.75 mm);ostial LAD 30%. Ostial ramus 40%. Proximal to mid LAD 50%. Normal LV function and normal LVEDP.   [name] Hiatal hernia   . High cholesterol   . HTN (hypertension)   . 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 Skull fracture ([place])     Surgeries: Procedure(s): LEFT TOTAL KNEE ARTHROPLASTY WITH COMPUTER NAVIGATION on 07/30/2018   Consultants (if any):   Discharged Condition: Improved  Hospital Course: Stephen Graves is an 62 y.o. male who was admitted 07/30/2018 with a diagnosis of Osteoarthritis of left knee and went to the operating room on 07/30/2018 and underwent the above named procedures.    He was given perioperative antibiotics:  Anti-infectives (From admission, onward)   Start     Dose/Rate Route Frequency Ordered Stop   07/30/18 2000  ceFAZolin (ANCEF) IVPB 2g/100 mL premix     2 g 200 mL/hr over 30 Minutes Intravenous Every 6 hours 07/30/18 1715 07/31/18 0219   07/30/18 1130  ceFAZolin (ANCEF) IVPB 2g/100 mL premix     2 g 200 mL/hr over 30 Minutes Intravenous On call to O.R. 07/30/18 1126 07/30/18 1407    .  He was given sequential compression devices, early ambulation, and ASA for DVT prophylaxis.  He benefited maximally from the hospital stay and there were no complications.    Recent vital signs:  Vitals:   07/31/18 0508 07/31/18 0744  BP: 138/89 (!) 142/86  Pulse:  (!) 53 (!) 59  Resp: 15   Temp: 98.5 F (36.9 C)   SpO2: 99%     Recent laboratory studies:  Lab Results  Component Value Date   HGB 11.3 (L) 07/31/2018   HGB 14.8 07/23/2018   HGB 14.3 07/18/2017   Lab Results  Component Value Date   WBC 5.3 07/31/2018   PLT 80 (L) 07/31/2018   Lab Results  Component Value Date   INR 1.08 07/17/2017   Lab Results  Component Value Date   NA 138 07/31/2018   K 3.7 07/31/2018   CL 104 07/31/2018   CO2 28 07/31/2018   BUN 17 07/31/2018   CREATININE 1.04 07/31/2018   GLUCOSE 107 (H) 07/31/2018    Discharge Medications:   Allergies as of 07/31/2018      Reactions   Lipitor [atorvastatin]    Pain in neck and legs after taking medication      Medication List    STOP taking these medications   acetaminophen 500 MG tablet Commonly known as:  TYLENOL     TAKE these medications   aspirin 81 MG chewable tablet Chew 1 tablet (81 mg total) by mouth 2 (two) times daily. What changed:  when to take this   carvedilol 3.125 MG tablet Commonly known as:  COREG Take 1 tablet (3.125 mg total) by mouth 2 (two) times daily.   citalopram 40 MG tablet Commonly known as:  CELEXA Take  40 mg by mouth daily.   docusate sodium 100 MG capsule Commonly known as:  COLACE Take 1 capsule (100 mg total) by mouth 2 (two) times daily.   fish oil-omega-3 fatty acids 1000 MG capsule Take 1 g by mouth daily.   HYDROcodone-acetaminophen 5-325 MG tablet Commonly known as:  NORCO/VICODIN Take 1-2 tablets by mouth every 6 (six) hours as needed (pain score 4-6).   losartan-hydrochlorothiazide 100-25 MG tablet Commonly known as:  HYZAAR Take 0.5 tablets by mouth daily.   nitroGLYCERIN 0.4 MG SL tablet Commonly known as:  NITROSTAT Place 1 tablet (0.4 mg total) under the tongue every 5 (five) minutes as needed for chest pain.   ondansetron 4 MG tablet Commonly known as:  ZOFRAN Take 1 tablet (4 mg total) by mouth every 6 (six) hours as needed for  nausea.   potassium chloride SA 20 MEQ tablet Commonly known as:  K-DUR,KLOR-CON Take 20 mEq by mouth daily.   rosuvastatin 20 MG tablet Commonly known as:  CRESTOR Take 1 tablet (20 mg total) by mouth daily at 6 PM.   senna 8.6 MG Tabs tablet Commonly known as:  SENOKOT Take 1 tablet (8.6 mg total) by mouth 2 (two) times daily.   ticagrelor 90 MG Tabs tablet Commonly known as:  BRILINTA Take 1 tablet (90 mg total) by mouth 2 (two) times daily.       Diagnostic Studies: Dg Knee Left Port  Result Date: 07/30/2018 CLINICAL DATA:  Status post left knee revision EXAM: PORTABLE LEFT KNEE - 1-2 VIEW COMPARISON:  08/22/2017 FINDINGS: Status post left knee replacement with intact hardware and normal alignment. Moderate gas within the joint and soft tissues consistent with recent surgery IMPRESSION: Status post left knee replacement with expected operative changes. Electronically Signed   By: Donavan Foil M.D.   On: 07/30/2018 17:00    Disposition: Discharge disposition: 01-Home or Self Care       Discharge Instructions    Call MD / Call 911   Complete by:  As directed    If you experience chest pain or shortness of breath, CALL 911 and be transported to the hospital emergency room.  If you develope a fever above 101 F, pus (white drainage) or increased drainage or redness at the wound, or calf pain, call your surgeon's office.   Constipation Prevention   Complete by:  As directed    Drink plenty of fluids.  Prune juice may be helpful.  You may use a stool softener, such as Colace (over the counter) 100 mg twice a day.  Use MiraLax (over the counter) for constipation as needed.   Diet - low sodium heart healthy   Complete by:  As directed    Do not put a pillow under the knee. Place it under the heel.   Complete by:  As directed    Driving restrictions   Complete by:  As directed    No driving for 4 weeks   Increase activity slowly as tolerated   Complete by:  As directed     Lifting restrictions   Complete by:  As directed    No lifting for 6 weeks   TED hose   Complete by:  As directed    Use stockings (TED hose) for 2 weeks on both leg(s).  You may remove them at night for sleeping.      Follow-up Information    Jaylen Claude, Aaron Edelman, MD. Schedule an appointment as soon as possible for a visit in  2 weeks.   Specialty:  Orthopedic Surgery Why:  For wound re-check Contact information: [address] [place] [address] [phone]            Signed: Hilton Cork Maribeth Jiles 07/31/2018, 9:26 AM

## 2018-07-31 NOTE — Progress Notes (Signed)
Physical Therapy Treatment Patient Details Name: Stephen BUCCHERI MRN: [id number] DOB: [birth year] Today's Date: 07/31/2018    History of Present Illness 62 yo male s/p L TKR on 07/30/18. PMH includes CAD, acute coronary syndrome with unstable angina 2018, HLD, HTN, L foot surgery, L heart cath and coronary angiography 2018, coronary stent placement.     PT Comments    Pt with proficiency in stair navigation, RW use with ambulation with minor verbal cuing, and transfers. PT told pt to have supervision assist with mobility from sister for the next week, and pt confirms that pt's sister will be at home with him. Pt ambulation has progressed to step-through gait and pt is able to tolerate increased distance. PT to d/c home with sister today.    Follow Up Recommendations  Follow surgeon's recommendation for DC plan and follow-up therapies;Supervision for mobility/OOB     Equipment Recommendations  None recommended by PT    Recommendations for Other Services       Precautions / Restrictions Precautions Precautions: Fall Restrictions Weight Bearing Restrictions: No Other Position/Activity Restrictions: WBAT     Mobility  Bed Mobility Overal bed mobility: Needs Assistance Bed Mobility: Supine to Sit     Supine to sit: HOB elevated;Min assist     General bed mobility comments: Pt up in chair upon PT arrival   Transfers Overall transfer level: Needs assistance Equipment used: Rolling walker (2 wheeled) Transfers: Sit to/from Stand Sit to Stand: From elevated surface;Supervision         General transfer comment: supervision for safety. No verbal cuing or steadying required.   Ambulation/Gait Ambulation/Gait assistance: Supervision Gait Distance (Feet): 100 Feet(80 ft in hallway, 20 ft in room to go to and from bathroom) Assistive device: Rolling walker (2 wheeled) Gait Pattern/deviations: Decreased stride length;Step-through pattern Gait velocity: decr    General  Gait Details: supervision for safety. Verbal cuing to emphasize safety with pt's placement in RW with turning, stepping pattern.   Stairs Stairs: Yes Stairs assistance: Min guard Stair Management: One rail Right;With cane Number of Stairs: 4 General stair comments: practice stairs with rail on R with ascending and rail on L for descending. No verbal cuing required for sequencing this session.    Wheelchair Mobility    Modified Rankin (Stroke Patients Only)       Balance Overall balance assessment: Mild deficits observed, not formally tested                                          Cognition Arousal/Alertness: Awake/alert Behavior During Therapy: WFL for tasks assessed/performed Overall Cognitive Status: Within Functional Limits for tasks assessed                                 General Comments: some decreased safety awareness; requires verbal cuing to correct       Exercises Total Joint Exercises Ankle Circles/Pumps: AROM;Both;20 reps;Seated Quad Sets: AROM;Left;10 reps;Seated Towel Squeeze: AROM;Both;10 reps;Seated Short Arc Quad: AROM;Left;10 reps;Seated Heel Slides: AAROM;Left;10 reps;Seated Hip ABduction/ADduction: AROM;Left;10 reps;Seated Straight Leg Raises: AAROM;Left;10 reps;Seated Goniometric ROM: Knee AAROM approximately 5-90*, limited by pain     General Comments        Pertinent Vitals/Pain Pain Assessment: Faces Pain Score: 4  Faces Pain Scale: Hurts a little bit Pain Location: L knee, with mobility  Pain Descriptors /  Indicators: Sore Pain Intervention(s): Limited activity within patient's tolerance;Repositioned;Monitored during session;Ice applied    Home Living                      Prior Function            PT Goals (current goals can now be found in the care plan section) Acute Rehab PT Goals PT Goal Formulation: With patient Time For Goal Achievement: 08/06/18 Potential to Achieve Goals:  Good Progress towards PT goals: Progressing toward goals    Frequency    7X/week      PT Plan Current plan remains appropriate    Co-evaluation              AM-PAC PT "6 Clicks" Daily Activity  Outcome Measure  Difficulty turning over in bed (including adjusting bedclothes, sheets and blankets)?: Unable Difficulty moving from lying on back to sitting on the side of the bed? : Unable Difficulty sitting down on and standing up from a chair with arms (e.g., wheelchair, bedside commode, etc,.)?: Unable Help needed moving to and from a bed to chair (including a wheelchair)?: None Help needed walking in hospital room?: A Little Help needed climbing 3-5 steps with a railing? : A Little 6 Click Score: 13    End of Session Equipment Utilized During Treatment: Gait belt Activity Tolerance: Patient tolerated treatment well Patient left: in chair;with chair alarm set;with call bell/phone within reach Nurse Communication: Mobility status PT Visit Diagnosis: Other abnormalities of gait and mobility (R26.89);Difficulty in walking, not elsewhere classified (R26.2)     Time: 2683-4196 PT Time Calculation (min) (ACUTE ONLY): 24 min  Charges:  $Gait Training: 8-22 mins $Therapeutic Activity: 8-22 mins                     Julien Girt, PT Acute Rehabilitation Services Pager [phone]  Office [phone]   Zylon Creamer D Annetta Deiss 07/31/2018, 3:20 PM

## 2018-07-31 NOTE — Care Management Note (Signed)
Case Management Note  Patient Details  Name: Stephen Graves MRN: [id number] Date of Birth: [birth year]  Subjective/Objective:      Spoke with patient at bedside. Confirmed plan for OP PT, already arranged. Has RW and 3n1. [phone]              Action/Plan:   Expected Discharge Date:  07/31/18               Expected Discharge Plan:  OP Rehab  In-House Referral:  NA  Discharge planning Services  CM Consult  Post Acute Care Choice:  NA Choice offered to:  Patient  DME Arranged:  N/A DME Agency:  NA  HH Arranged:  NA HH Agency:  NA  Status of Service:  Completed, signed off  If discussed at [place] of Stay Meetings, dates discussed:    Additional Comments:  Guadalupe Maple, RN 07/31/2018, 11:19 AM

## 2018-08-05 ENCOUNTER — Telehealth: Payer: Self-pay | Admitting: Cardiovascular Disease

## 2018-08-05 NOTE — Telephone Encounter (Signed)
Medication removed from his med list

## 2018-08-05 NOTE — Telephone Encounter (Signed)
New Message   Pt c/o medication issue:  1. Name of [place]  2. How are you currently taking this medication (dosage and times per day)?   3. Are you having a reaction (difficulty breathing--STAT)?   4. What is your medication issue? Per after hours message patient was advised that he needed to stop the Brilinta on 10/31 but he received a new prescription. He would like to know what he needs to do. Please call to discuss.

## 2018-10-08 ENCOUNTER — Ambulatory Visit: Payer: BC Managed Care – PPO | Admitting: Cardiovascular Disease

## 2018-10-30 ENCOUNTER — Encounter

## 2018-10-30 ENCOUNTER — Ambulatory Visit (INDEPENDENT_AMBULATORY_CARE_PROVIDER_SITE_OTHER): Payer: Medicare HMO | Admitting: Cardiovascular Disease

## 2018-10-30 ENCOUNTER — Encounter: Payer: Self-pay | Admitting: Cardiovascular Disease

## 2018-10-30 ENCOUNTER — Ambulatory Visit: Payer: BC Managed Care – PPO | Admitting: Cardiovascular Disease

## 2018-10-30 VITALS — BP 140/86 | HR 58 | Ht 71.0 in | Wt 223.0 lb

## 2018-10-30 DIAGNOSIS — E785 Hyperlipidemia, unspecified: Secondary | ICD-10-CM | POA: Diagnosis not present

## 2018-10-30 DIAGNOSIS — Z955 Presence of coronary angioplasty implant and graft: Secondary | ICD-10-CM

## 2018-10-30 DIAGNOSIS — I25118 Atherosclerotic heart disease of native coronary artery with other forms of angina pectoris: Secondary | ICD-10-CM | POA: Diagnosis not present

## 2018-10-30 DIAGNOSIS — I10 Essential (primary) hypertension: Secondary | ICD-10-CM | POA: Diagnosis not present

## 2018-10-30 NOTE — Patient Instructions (Signed)
Medication Instructions:  None today If you need a refill on your cardiac medications before your next appointment, please call your pharmacy.   Lab work: None topday If you have labs (blood work) drawn today and your tests are completely normal, you will receive your results only by: [name] MyChart Message (if you have MyChart) OR . A paper copy in the mail If you have any lab test that is abnormal or we need to change your treatment, we will call you to review the results.  Testing/Procedures: None Today  Follow-Up: At [facility], you and your health needs are our priority.  As part of our continuing mission to provide you with exceptional heart care, we have created designated Provider Care Teams.  These Care Teams include your primary Cardiologist (physician) and Advanced Practice Providers (APPs -  Physician Assistants and Nurse Practitioners) who all work together to provide you with the care you need, when you need it. You will need a follow up appointment in 6 months.  Please call our office 2 months in advance to schedule this appointment.  You may see Kate Sable, MD or one of the following Advanced Practice Providers on your designated Care Team:   Bernerd Pho, PA-C [facility]) . Ermalinda Barrios, PA-C ([place])  Any Other Special Instructions Will Be Listed Below (If Applicable). None  Your physician recommends that you continue on your current medications as directed. Please refer to the Current Medication list given to you today.

## 2018-10-30 NOTE — Progress Notes (Signed)
SUBJECTIVE: The patient presents for follow-up of coronary artery disease, hypertension, and hyperlipidemia.  He underwent coronary angiography on 07/17/17 and was found to have severe 1 vessel coronary artery disease with 99% stenosis in the mid to distal LAD for which he underwent successful angioplasty and drug-eluting stent placement. A small septal branch was jailed by the stent with sluggish flow. This resulted in chest pain and was treated with a nitroglycerin drip at the time of the procedure.  Echocardiogram on 07/17/17 demonstrated normal left ventricular systolic function and regional wall motion, LVEF 60-65%.  The patient denies any symptoms of chest pain, palpitations, shortness of breath, lightheadedness, dizziness, leg swelling, orthopnea, PND, and syncope.  He underwent successful left total knee replacement surgery in November 2019.  He has not had use nitroglycerin in the last 6 months or more.  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   Social history: He is a Sports coach by profession.  Review of Systems: As per "subjective", otherwise negative.  Allergies  Allergen Reactions  . Lipitor [Atorvastatin]     Pain in neck and legs after taking medication    Current Outpatient Medications  Medication Sig Dispense Refill  . aspirin 81 MG chewable tablet Chew 1 tablet (81 mg total) by mouth 2 (two) times daily. 60 tablet 1  . carvedilol (COREG) 3.125 MG tablet Take 1 tablet (3.125 mg total) by mouth 2 (two) times daily. 60 tablet 11  . citalopram (CELEXA) 40 MG tablet Take 40 mg by mouth daily.     . fish oil-omega-3 fatty acids 1000 MG capsule Take 1 g by mouth daily.    [name] losartan-hydrochlorothiazide (HYZAAR) 100-25 MG tablet Take 0.5 tablets by mouth daily.     . nitroGLYCERIN (NITROSTAT) 0.4 MG SL tablet Place 1 tablet (0.4 mg total) under the tongue every 5 (five) minutes as needed for chest pain. 25 tablet  3  . potassium chloride SA (K-DUR,KLOR-CON) 20 MEQ tablet Take 20 mEq by mouth daily.      . rosuvastatin (CRESTOR) 20 MG tablet Take 1 tablet (20 mg total) by mouth daily at 6 PM. 90 tablet 3   No current facility-administered medications for this visit.     Past Medical History:  Diagnosis Date  . Anxiety   . Coronary artery disease involving native coronary 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 ([place]) 07/18/2017   Cardiac cath 1020 1418: Mid LAD to Dist LAD lesion, 99 %stenosed. - PCI with resolute DES (2.5 X 26 mm Resolute Onyx stent - 2.75 mm);ostial LAD 30%. Ostial ramus 40%. Proximal to mid LAD 50%. Normal LV function and normal LVEDP.   [name] Hiatal hernia   . High cholesterol   . HTN (hypertension)   . 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 Skull fracture [facility])     Past Surgical History:  Procedure Laterality Date  . CORONARY STENT INTERVENTION N/A 07/17/2017   Procedure: CORONARY STENT INTERVENTION;  Surgeon: Wellington Hampshire, MD;  Location: [facility];  Service: Cardiovascular;  Laterality: N/A;  . FOOT SURGERY Left   . KNEE ARTHROPLASTY Left 07/30/2018   Procedure: LEFT TOTAL KNEE ARTHROPLASTY WITH COMPUTER NAVIGATION;  Surgeon: Rod Can, MD;  Location: [facility];  Service: Orthopedics;  Laterality: Left;  Needs RNFA  . LEFT HEART CATH AND CORONARY ANGIOGRAPHY N/A 07/17/2017   Procedure: LEFT HEART CATH AND CORONARY ANGIOGRAPHY;  Surgeon: Wellington Hampshire, MD;  Location: [facility];  Service: Cardiovascular;  Laterality: N/A;  . left wrist surgery    . SKIN GRAFT  1998  . TONSILLECTOMY      Social History   Socioeconomic History  . Marital status: Divorced    Spouse name: Not on file  . Number of children: Not on file  . Years of education: Not on file  . Highest education level: Not on file  Occupational History  . Not on file  Social Needs  . Financial resource strain: Not on file  . Food insecurity:    Worry: Not  on file    Inability: Not on file  . Transportation needs:    Medical: Not on file    Non-medical: Not on file  Tobacco Use  . Smoking status: Former Smoker    Types: Cigarettes    Last attempt to quit: 09/24/1988    Years since quitting: 30.1  . Smokeless tobacco: Never Used  . Tobacco comment: tobacco use - no  Substance and Sexual Activity  . Alcohol use: No  . Drug use: No  . Sexual activity: Not on file  Lifestyle  . Physical activity:    Days per week: Not on file    Minutes per session: Not on file  . Stress: Not on file  Relationships  . Social connections:    Talks on phone: Not on file    Gets together: Not on file    Attends religious service: Not on file    Active member of club or organization: Not on file    Attends meetings of clubs or organizations: Not on file    Relationship status: Not on file  . Intimate partner violence:    Fear of current or ex partner: Not on file    Emotionally abused: Not on file    Physically abused: Not on file    Forced sexual activity: Not on file  Other Topics Concern  . Not on file  Social History Narrative   Full time; single.      Vitals:   10/30/18 1043  BP: 140/86  Pulse: (!) 58  SpO2: 98%  Weight: 223 lb (101.2 kg)  Height: 5\' 11"  (1.803 m)    Wt Readings from Last 3 Encounters:  10/30/18 223 lb (101.2 kg)  07/30/18 208 lb 6 oz (94.5 kg)  07/23/18 208 lb 6 oz (94.5 kg)     PHYSICAL EXAM General: NAD HEENT: Normal. Neck: No JVD, no thyromegaly. Lungs: Clear to auscultation bilaterally with normal respiratory effort. CV: Regular rate and rhythm, normal S1/S2, no S3/S4, no murmur. No pretibial or periankle edema.  No carotid bruit.   Abdomen: Soft, nontender, no distention.  Neurologic: Alert and oriented.  Psych: Normal affect. Skin: Normal. Musculoskeletal: No gross deformities.    ECG: Reviewed above under Subjective   Labs: Lab Results  Component Value Date/Time   K 3.7 07/31/2018 05:50 AM    BUN 17 07/31/2018 05:50 AM   CREATININE 1.04 07/31/2018 05:50 AM   HGB 11.3 (L) 07/31/2018 05:50 AM     Lipids: Lab Results  Component Value Date/Time   LDLCALC 46 11/05/2017 08:48 AM   CHOL 111 11/05/2017 08:48 AM   TRIG 112 11/05/2017 08:48 AM   HDL 43 11/05/2017 08:48 AM       ASSESSMENT AND PLAN:  1. Coronary artery disease status post drug-eluting stent placement to the mid to distal LAD: Symptomatically stable. Continue aspirin,  carvedilol and Crestor.  He is currently taking aspirin 81 mg twice daily as directed by his orthopedic surgeon.  I recommend that he speak with him to see if this dose can be reduced.  2. Hypertension: Blood pressure is  mildly elevated.  No changes to therapy today.  3. Hyperlipidemia: Continue statin therapy with Crestor 20 mg. LDL 46 on 11/05/17 (down from 134 on 07/18/17).  Full lipid panel reviewed above.  I will obtain a copy of lipids from PCP.   Disposition: Follow up 6 months   Kate Sable, M.D., F.A.C.C.

## 2018-11-17 ENCOUNTER — Other Ambulatory Visit: Payer: Self-pay | Admitting: Cardiovascular Disease

## 2019-01-18 ENCOUNTER — Encounter: Payer: Self-pay | Admitting: Orthopedic Surgery

## 2019-02-10 ENCOUNTER — Other Ambulatory Visit: Payer: Self-pay | Admitting: Cardiovascular Disease

## 2019-02-10 DIAGNOSIS — Z471 Aftercare following joint replacement surgery: Secondary | ICD-10-CM | POA: Diagnosis not present

## 2019-02-10 DIAGNOSIS — Z96652 Presence of left artificial knee joint: Secondary | ICD-10-CM | POA: Diagnosis not present

## 2019-03-05 DIAGNOSIS — R32 Unspecified urinary incontinence: Secondary | ICD-10-CM | POA: Diagnosis not present

## 2019-03-05 DIAGNOSIS — N529 Male erectile dysfunction, unspecified: Secondary | ICD-10-CM | POA: Diagnosis not present

## 2019-03-05 DIAGNOSIS — I10 Essential (primary) hypertension: Secondary | ICD-10-CM | POA: Diagnosis not present

## 2019-03-05 DIAGNOSIS — I2510 Atherosclerotic heart disease of native coronary artery without angina pectoris: Secondary | ICD-10-CM | POA: Diagnosis not present

## 2019-03-05 DIAGNOSIS — R69 Illness, unspecified: Secondary | ICD-10-CM | POA: Diagnosis not present

## 2019-03-05 DIAGNOSIS — E669 Obesity, unspecified: Secondary | ICD-10-CM | POA: Diagnosis not present

## 2019-03-05 DIAGNOSIS — E785 Hyperlipidemia, unspecified: Secondary | ICD-10-CM | POA: Diagnosis not present

## 2019-03-05 DIAGNOSIS — F419 Anxiety disorder, unspecified: Secondary | ICD-10-CM | POA: Diagnosis not present

## 2019-03-05 DIAGNOSIS — Z7982 Long term (current) use of aspirin: Secondary | ICD-10-CM | POA: Diagnosis not present

## 2019-03-05 DIAGNOSIS — K5900 Constipation, unspecified: Secondary | ICD-10-CM | POA: Diagnosis not present

## 2019-03-18 DIAGNOSIS — R69 Illness, unspecified: Secondary | ICD-10-CM | POA: Diagnosis not present

## 2019-03-23 DIAGNOSIS — E6609 Other obesity due to excess calories: Secondary | ICD-10-CM | POA: Diagnosis not present

## 2019-03-23 DIAGNOSIS — Z6831 Body mass index (BMI) 31.0-31.9, adult: Secondary | ICD-10-CM | POA: Diagnosis not present

## 2019-03-23 DIAGNOSIS — R7309 Other abnormal glucose: Secondary | ICD-10-CM | POA: Diagnosis not present

## 2019-03-23 DIAGNOSIS — R350 Frequency of micturition: Secondary | ICD-10-CM | POA: Diagnosis not present

## 2019-03-23 DIAGNOSIS — Z1389 Encounter for screening for other disorder: Secondary | ICD-10-CM | POA: Diagnosis not present

## 2019-03-23 DIAGNOSIS — I10 Essential (primary) hypertension: Secondary | ICD-10-CM | POA: Diagnosis not present

## 2019-03-24 DIAGNOSIS — H47293 Other optic atrophy, bilateral: Secondary | ICD-10-CM | POA: Diagnosis not present

## 2019-03-24 DIAGNOSIS — H5213 Myopia, bilateral: Secondary | ICD-10-CM | POA: Diagnosis not present

## 2019-03-24 DIAGNOSIS — H52203 Unspecified astigmatism, bilateral: Secondary | ICD-10-CM | POA: Diagnosis not present

## 2019-03-24 DIAGNOSIS — H2513 Age-related nuclear cataract, bilateral: Secondary | ICD-10-CM | POA: Diagnosis not present

## 2019-03-24 DIAGNOSIS — H524 Presbyopia: Secondary | ICD-10-CM | POA: Diagnosis not present

## 2019-03-26 DIAGNOSIS — Z0100 Encounter for examination of eyes and vision without abnormal findings: Secondary | ICD-10-CM | POA: Diagnosis not present

## 2019-03-30 DIAGNOSIS — R898 Other abnormal findings in specimens from other organs, systems and tissues: Secondary | ICD-10-CM | POA: Diagnosis not present

## 2019-06-10 ENCOUNTER — Telehealth: Payer: Self-pay | Admitting: Cardiovascular Disease

## 2019-06-10 NOTE — Telephone Encounter (Signed)
Virtual Visit Pre-Appointment Phone Call  "(Name), I am calling you today to discuss your upcoming appointment. We are currently trying to limit exposure to the virus that causes COVID-19 by seeing patients at home rather than in the office."  1. "What is the BEST phone number to call the day of the visit?" - include this in appointment notes  2. Do you have or have access to (through a family member/friend) a smartphone with video capability that we can use for your visit?" a. If yes - list this number in appt notes as cell (if different from BEST phone #) and list the appointment type as a VIDEO visit in appointment notes b. If no - list the appointment type as a PHONE visit in appointment notes  3. Confirm consent - "In the setting of the current Covid19 crisis, you are scheduled for a (phone or video) visit with your provider on (date) at (time).  Just as we do with many in-office visits, in order for you to participate in this visit, we must obtain consent.  If you'd like, I can send this to your mychart (if signed up) or email for you to review.  Otherwise, I can obtain your verbal consent now.  All virtual visits are billed to your insurance company just like a normal visit would be.  By agreeing to a virtual visit, we'd like you to understand that the technology does not allow for your provider to perform an examination, and thus may limit your provider's ability to fully assess your condition. If your provider identifies any concerns that need to be evaluated in person, we will make arrangements to do so.  Finally, though the technology is pretty good, we cannot assure that it will always work on either your or our end, and in the setting of a video visit, we may have to convert it to a phone-only visit.  In either situation, we cannot ensure that we have a secure connection.  Are you willing to proceed?" STAFF: Did the patient verbally acknowledge consent to telehealth visit? Document  YES/NO here: Yes  4. Advise patient to be prepared - "Two hours prior to your appointment, go ahead and check your blood pressure, pulse, oxygen saturation, and your weight (if you have the equipment to check those) and write them all down. When your visit starts, your provider will ask you for this information. If you have an Apple Watch or Kardia device, please plan to have heart rate information ready on the day of your appointment. Please have a pen and paper handy nearby the day of the visit as well."  5. Give patient instructions for MyChart download to smartphone OR [email or web address] as below if video visit (depending on what platform provider is using)  6. Inform patient they will receive a phone call 15 minutes prior to their appointment time (may be from unknown caller ID) so they should be prepared to answer    Stephen Graves has been deemed a candidate for a follow-up tele-health visit to limit community exposure during the Covid-19 pandemic. I spoke with the patient via phone to ensure availability of phone/video source, confirm preferred email & phone number, and discuss instructions and expectations.  I reminded Stephen Graves to be prepared with any vital sign and/or heart rhythm information that could potentially be obtained via home monitoring, at the time of his visit. I reminded Stephen Graves to expect a phone call prior to  his visit.  Orinda Kenner 06/10/2019 3:45 PM

## 2019-06-11 ENCOUNTER — Other Ambulatory Visit: Payer: Self-pay

## 2019-06-11 ENCOUNTER — Encounter: Payer: Self-pay | Admitting: Cardiovascular Disease

## 2019-06-11 ENCOUNTER — Encounter: Payer: Self-pay | Admitting: *Deleted

## 2019-06-11 ENCOUNTER — Telehealth (INDEPENDENT_AMBULATORY_CARE_PROVIDER_SITE_OTHER): Payer: Medicare HMO | Admitting: Cardiovascular Disease

## 2019-06-11 VITALS — BP 143/89 | HR 67 | Ht 71.74 in | Wt 223.0 lb

## 2019-06-11 DIAGNOSIS — I25118 Atherosclerotic heart disease of native coronary artery with other forms of angina pectoris: Secondary | ICD-10-CM

## 2019-06-11 DIAGNOSIS — E785 Hyperlipidemia, unspecified: Secondary | ICD-10-CM

## 2019-06-11 DIAGNOSIS — I10 Essential (primary) hypertension: Secondary | ICD-10-CM

## 2019-06-11 DIAGNOSIS — Z955 Presence of coronary angioplasty implant and graft: Secondary | ICD-10-CM

## 2019-06-11 IMAGING — DX DG KNEE 1-2V PORT*L*
2 series · 2 of 2 positions shown · non-contrast
Comparison: 08/22/2017

CLINICAL DATA: Status post left knee revision

EXAM:
PORTABLE LEFT KNEE - 1-2 VIEW

[knee ap]
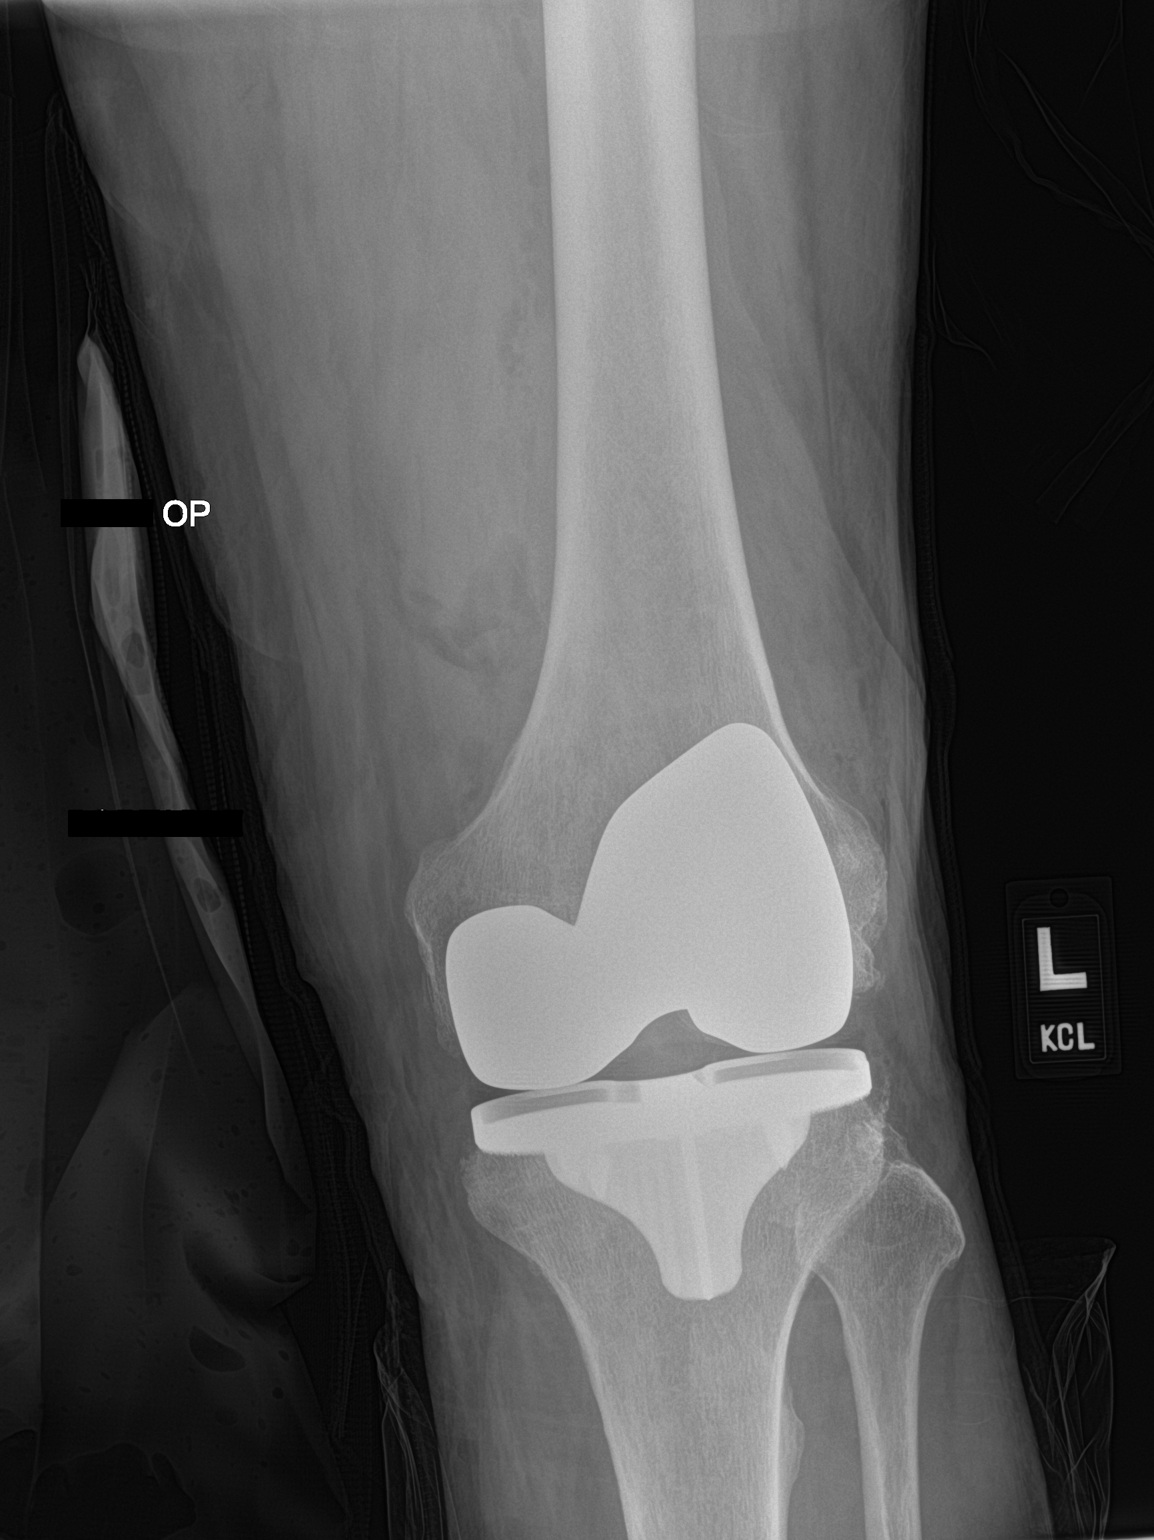

[knee lat]
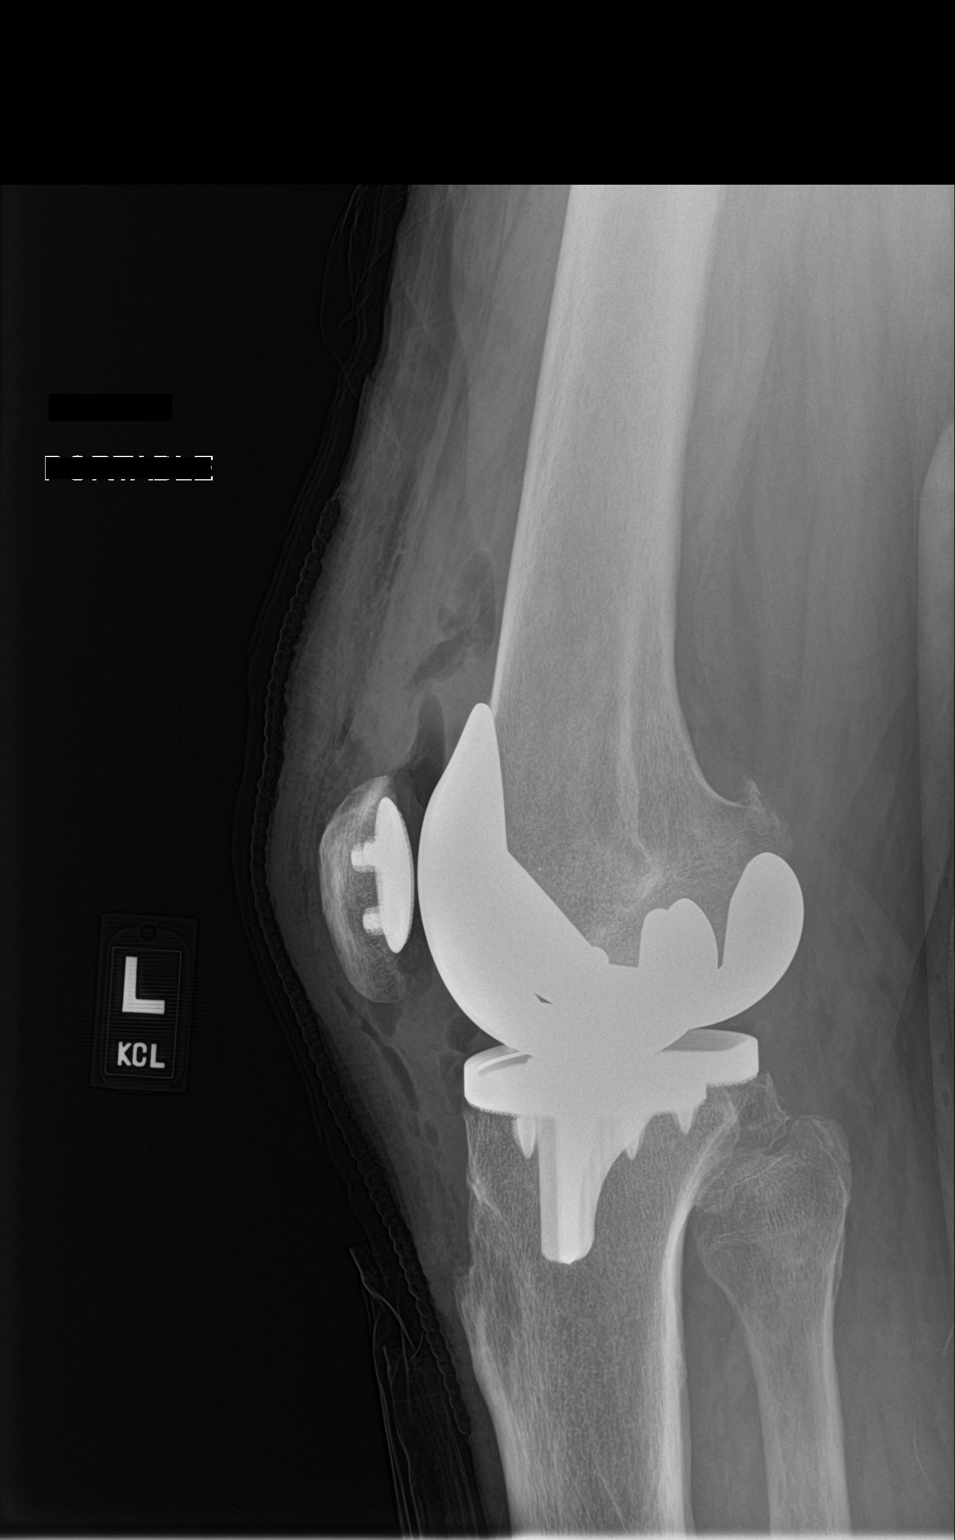

[2 of 2 positions shown; findings below may reference images not displayed]

FINDINGS: Status post left knee replacement with intact hardware and normal
alignment. Moderate gas within the joint and soft tissues consistent
with recent surgery
IMPRESSION: Status post left knee replacement with expected operative changes.

## 2019-06-11 NOTE — Patient Instructions (Addendum)
Medication Instructions:   Your physician recommends that you continue on your current medications as directed. Please refer to the Current Medication list given to you today.  Labwork:  NONE  Testing/Procedures:  NONE  Follow-Up:  Your physician recommends that you schedule a follow-up appointment in: 1 year. You will receive a reminder letter in the mail in about 10 months reminding you to call and schedule your appointment. If you don't receive this letter, please contact our office.  Any Other Special Instructions Will Be Listed Below (If Applicable).  Please check your blood pressure at home, 3-4 times per week for one month. Please call our office with your readings.  If you need a refill on your cardiac medications before your next appointment, please call your pharmacy.

## 2019-06-11 NOTE — Addendum Note (Signed)
Addended by: Merlene Laughter on: 06/11/2019 11:28 AM   Modules accepted: Orders

## 2019-06-11 NOTE — Progress Notes (Signed)
Virtual Visit via Telephone Note   This visit type was conducted due to national recommendations for restrictions regarding the COVID-19 Pandemic (e.g. social distancing) in an effort to limit this patient's exposure and mitigate transmission in our community.  Due to his co-morbid illnesses, this patient is at least at moderate risk for complications without adequate follow up.  This format is felt to be most appropriate for this patient at this time.  The patient did not have access to video technology/had technical difficulties with video requiring transitioning to audio format only (telephone).  All issues noted in this document were discussed and addressed.  No physical exam could be performed with this format.  Please refer to the patient's chart for his  consent to telehealth for [facility].   Date:  06/11/2019   ID:  Stephen Graves, DOB 07-21-1956, MRN [id number]  Patient Location: Home Provider Location: Office  [facility]P:  [facility]  Cardiologist:  Kate Sable, MD  Electrophysiologist:  None   Evaluation Performed:  Follow-Up Visit  Chief Complaint:  CAD  History of Present Illness:    Stephen Graves is a 63 y.o. male with coronary artery disease, hypertension, and hyperlipidemia.  He underwent coronary angiography on 07/17/17 and was found to have severe 1 vessel coronary artery disease with 99% stenosis in the mid to distal LADfor which he underwent successful angioplasty and drug-eluting stent placement. A small septal branch was jailed by the stent withsluggish flow. This resulted in chest pain and was treated withanitroglycerin drip at the time of the procedure.  Echocardiogram on 07/17/17 demonstrated normal left ventricular systolic function and regional wall motion, LVEF 60-65%.  The patient denies any symptoms of palpitations, shortness of breath, lightheadedness, dizziness, leg swelling, orthopnea, PND, and syncope. He has  chronic chest pains which start in his back and radiates to his chest, relieved with drinking Diet Pepsi. It is also sometimes relieved with moving his arm in a circle.  The patient does not have symptoms concerning for COVID-19 infection (fever, chills, cough, or new shortness of breath).    He does not exercise.  He tried taking Viagra twice (3-4 tablets each time) but it led to palpitations. He didn't take it with nitro.   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   Past Medical History:  Diagnosis Date  . Anxiety   . Coronary artery disease involving native coronary 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 ([place]) 07/18/2017   Cardiac cath 1020 1418: Mid LAD to Dist LAD lesion, 99 %stenosed. - PCI with resolute DES (2.5 X 26 mm Resolute Onyx stent - 2.75 mm);ostial LAD 30%. Ostial ramus 40%. Proximal to mid LAD 50%. Normal LV function and normal LVEDP.   [name] Hiatal hernia   . High cholesterol   . HTN (hypertension)   . 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 Skull fracture [facility])    Past Surgical History:  Procedure Laterality Date  . CORONARY STENT INTERVENTION N/A 07/17/2017   Procedure: CORONARY STENT INTERVENTION;  Surgeon: Wellington Hampshire, MD;  Location: [place] [facility];  Service: Cardiovascular;  Laterality: N/A;  . FOOT SURGERY Left   . KNEE ARTHROPLASTY Left 07/30/2018   Procedure: LEFT TOTAL KNEE ARTHROPLASTY WITH COMPUTER NAVIGATION;  Surgeon: Rod Can, MD;  Location: [facility];  Service: Orthopedics;  Laterality: Left;  Needs RNFA  . LEFT HEART CATH AND CORONARY ANGIOGRAPHY N/A 07/17/2017  Procedure: LEFT HEART CATH AND CORONARY ANGIOGRAPHY;  Surgeon: Wellington Hampshire, MD;  Location: [place] [facility];  Service: Cardiovascular;  Laterality: N/A;  . left wrist surgery    . SKIN GRAFT  1998  . TONSILLECTOMY       Current Meds  Medication Sig  . aspirin 81 MG chewable tablet Chew 1 tablet (81 mg total) by mouth 2 (two)  times daily.  . carvedilol (COREG) 3.125 MG tablet Take 1 tablet (3.125 mg total) by mouth 2 (two) times daily.  . citalopram (CELEXA) 40 MG tablet Take 40 mg by mouth daily.   . fish oil-omega-3 fatty acids 1000 MG capsule Take 1 g by mouth daily.  [name] losartan-hydrochlorothiazide (HYZAAR) 100-25 MG tablet Take 0.5 tablets by mouth daily.   . nitroGLYCERIN (NITROSTAT) 0.4 MG SL tablet Place 1 tablet (0.4 mg total) under the tongue every 5 (five) minutes as needed for chest pain.  . potassium chloride SA (K-DUR,KLOR-CON) 20 MEQ tablet Take 20 mEq by mouth daily.    . rosuvastatin (CRESTOR) 20 MG tablet TAKE 1 TABLET BY MOUTH ONCE DAILY AT  6PM     Allergies:   Lipitor [atorvastatin]   Social History   Tobacco Use  . Smoking status: Former Smoker    Types: Cigarettes    Quit date: 09/24/1988    Years since quitting: 30.7  . Smokeless tobacco: Never Used  . Tobacco comment: tobacco use - no  Substance Use Topics  . Alcohol use: No  . Drug use: No     Family Hx: The patient's family history includes CAD in his mother; Heart attack (age of onset: 44) in his father; Prostate cancer in his brother; Stroke (age of onset: 62) in his father; Thyroid nodules in his brother.  ROS:   Please see the history of present illness.     All other systems reviewed and are negative.   Prior CV studies:   The following studies were reviewed today:  Reviewed above  Labs/Other Tests and Data Reviewed:    EKG:  No ECG reviewed.  Recent Labs: 07/31/2018: BUN 17; Creatinine, Ser 1.04; Hemoglobin 11.3; Platelets 80; Potassium 3.7; Sodium 138   Recent Lipid Panel Lab Results  Component Value Date/Time   CHOL 111 11/05/2017 08:48 AM   TRIG 112 11/05/2017 08:48 AM   HDL 43 11/05/2017 08:48 AM   CHOLHDL 2.6 11/05/2017 08:48 AM   LDLCALC 46 11/05/2017 08:48 AM    Wt Readings from Last 3 Encounters:  06/11/19 223 lb (101.2 kg)  10/30/18 223 lb (101.2 kg)  07/30/18 208 lb 6 oz (94.5 kg)      Objective:    Vital Signs:  BP (!) 143/89   Pulse 67   Ht 5' 11.74" (1.822 m)   Wt 223 lb (101.2 kg)   BMI 30.46 kg/m    VITAL SIGNS:  reviewed  ASSESSMENT & PLAN:    1. Coronary artery disease status post drug-eluting stent placement to the mid to distal LAD: Symptomatically stable. Continue aspirin, carvedilol and Crestor. We talked about the need to begin exercising.  2. Hypertension: Blood pressure is mildly elevated. He had rushed to check it.  No changes to therapy today. Will need further monitoring. I asked him to check it 3-4 times/week for the next several weeks and to inform me of these values.  3. Hyperlipidemia: Continue statin therapy with Crestor 20 mg. I will obtain a copy of lipids from PCP.    COVID-19 Education: The  signs and symptoms of COVID-19 were discussed with the patient and how to seek care for testing (follow up with PCP or arrange E-visit).  The importance of social distancing was discussed today.  Time:   Today, I have spent 15 minutes with the patient with telehealth technology discussing the above problems.     Medication Adjustments/Labs and Tests Ordered: Current medicines are reviewed at length with the patient today.  Concerns regarding medicines are outlined above.   Tests Ordered: No orders of the defined types were placed in this encounter.   Medication Changes: No orders of the defined types were placed in this encounter.   Follow Up:  Virtual Visit or In Person in 1 year(s)  Signed, Kate Sable, MD  06/11/2019 11:01 AM    [place]

## 2019-07-27 DIAGNOSIS — E7849 Other hyperlipidemia: Secondary | ICD-10-CM | POA: Diagnosis not present

## 2019-07-27 DIAGNOSIS — I10 Essential (primary) hypertension: Secondary | ICD-10-CM | POA: Diagnosis not present

## 2019-07-27 DIAGNOSIS — E6609 Other obesity due to excess calories: Secondary | ICD-10-CM | POA: Diagnosis not present

## 2019-07-27 DIAGNOSIS — Z0001 Encounter for general adult medical examination with abnormal findings: Secondary | ICD-10-CM | POA: Diagnosis not present

## 2019-07-27 DIAGNOSIS — Z6831 Body mass index (BMI) 31.0-31.9, adult: Secondary | ICD-10-CM | POA: Diagnosis not present

## 2019-07-31 DIAGNOSIS — Z471 Aftercare following joint replacement surgery: Secondary | ICD-10-CM | POA: Diagnosis not present

## 2019-07-31 DIAGNOSIS — Z96652 Presence of left artificial knee joint: Secondary | ICD-10-CM | POA: Diagnosis not present

## 2019-07-31 DIAGNOSIS — M1711 Unilateral primary osteoarthritis, right knee: Secondary | ICD-10-CM | POA: Diagnosis not present

## 2019-08-11 ENCOUNTER — Telehealth: Payer: Self-pay

## 2019-08-11 NOTE — Telephone Encounter (Signed)
BP recordings reviewed by Dr.Koneswaran,no changes to medications, patient notified.

## 2019-09-08 DIAGNOSIS — D3709 Neoplasm of uncertain behavior of other specified sites of the oral cavity: Secondary | ICD-10-CM | POA: Diagnosis not present

## 2019-09-08 DIAGNOSIS — K121 Other forms of stomatitis: Secondary | ICD-10-CM | POA: Diagnosis not present

## 2019-09-08 DIAGNOSIS — H903 Sensorineural hearing loss, bilateral: Secondary | ICD-10-CM | POA: Diagnosis not present

## 2019-09-28 DIAGNOSIS — R69 Illness, unspecified: Secondary | ICD-10-CM | POA: Diagnosis not present

## 2019-12-19 ENCOUNTER — Other Ambulatory Visit: Payer: Self-pay | Admitting: Cardiovascular Disease

## 2019-12-22 DIAGNOSIS — R69 Illness, unspecified: Secondary | ICD-10-CM | POA: Diagnosis not present

## 2020-01-12 DIAGNOSIS — M1711 Unilateral primary osteoarthritis, right knee: Secondary | ICD-10-CM | POA: Diagnosis not present

## 2020-01-22 DIAGNOSIS — I25118 Atherosclerotic heart disease of native coronary artery with other forms of angina pectoris: Secondary | ICD-10-CM | POA: Diagnosis not present

## 2020-01-22 DIAGNOSIS — Z8674 Personal history of sudden cardiac arrest: Secondary | ICD-10-CM | POA: Diagnosis not present

## 2020-01-22 DIAGNOSIS — M170 Bilateral primary osteoarthritis of knee: Secondary | ICD-10-CM | POA: Diagnosis not present

## 2020-01-22 DIAGNOSIS — Z87891 Personal history of nicotine dependence: Secondary | ICD-10-CM | POA: Diagnosis not present

## 2020-02-22 DIAGNOSIS — I25118 Atherosclerotic heart disease of native coronary artery with other forms of angina pectoris: Secondary | ICD-10-CM | POA: Diagnosis not present

## 2020-02-22 DIAGNOSIS — Z87891 Personal history of nicotine dependence: Secondary | ICD-10-CM | POA: Diagnosis not present

## 2020-02-22 DIAGNOSIS — M170 Bilateral primary osteoarthritis of knee: Secondary | ICD-10-CM | POA: Diagnosis not present

## 2020-02-22 DIAGNOSIS — Z8674 Personal history of sudden cardiac arrest: Secondary | ICD-10-CM | POA: Diagnosis not present

## 2020-02-23 DIAGNOSIS — R69 Illness, unspecified: Secondary | ICD-10-CM | POA: Diagnosis not present

## 2020-02-23 DIAGNOSIS — F329 Major depressive disorder, single episode, unspecified: Secondary | ICD-10-CM | POA: Diagnosis not present

## 2020-03-22 ENCOUNTER — Other Ambulatory Visit: Payer: Self-pay | Admitting: Cardiovascular Disease

## 2020-03-23 DIAGNOSIS — Z8674 Personal history of sudden cardiac arrest: Secondary | ICD-10-CM | POA: Diagnosis not present

## 2020-03-23 DIAGNOSIS — Z87891 Personal history of nicotine dependence: Secondary | ICD-10-CM | POA: Diagnosis not present

## 2020-03-23 DIAGNOSIS — M170 Bilateral primary osteoarthritis of knee: Secondary | ICD-10-CM | POA: Diagnosis not present

## 2020-03-23 DIAGNOSIS — I25118 Atherosclerotic heart disease of native coronary artery with other forms of angina pectoris: Secondary | ICD-10-CM | POA: Diagnosis not present

## 2020-04-04 DIAGNOSIS — M1711 Unilateral primary osteoarthritis, right knee: Secondary | ICD-10-CM | POA: Diagnosis not present

## 2020-04-28 DIAGNOSIS — I25119 Atherosclerotic heart disease of native coronary artery with unspecified angina pectoris: Secondary | ICD-10-CM | POA: Diagnosis not present

## 2020-04-28 DIAGNOSIS — E785 Hyperlipidemia, unspecified: Secondary | ICD-10-CM | POA: Diagnosis not present

## 2020-04-28 DIAGNOSIS — E669 Obesity, unspecified: Secondary | ICD-10-CM | POA: Diagnosis not present

## 2020-04-28 DIAGNOSIS — N529 Male erectile dysfunction, unspecified: Secondary | ICD-10-CM | POA: Diagnosis not present

## 2020-04-28 DIAGNOSIS — R32 Unspecified urinary incontinence: Secondary | ICD-10-CM | POA: Diagnosis not present

## 2020-04-28 DIAGNOSIS — R69 Illness, unspecified: Secondary | ICD-10-CM | POA: Diagnosis not present

## 2020-04-28 DIAGNOSIS — I10 Essential (primary) hypertension: Secondary | ICD-10-CM | POA: Diagnosis not present

## 2020-04-28 DIAGNOSIS — Z823 Family history of stroke: Secondary | ICD-10-CM | POA: Diagnosis not present

## 2020-04-28 DIAGNOSIS — Z7982 Long term (current) use of aspirin: Secondary | ICD-10-CM | POA: Diagnosis not present

## 2020-05-09 DIAGNOSIS — H524 Presbyopia: Secondary | ICD-10-CM | POA: Diagnosis not present

## 2020-05-09 DIAGNOSIS — H47293 Other optic atrophy, bilateral: Secondary | ICD-10-CM | POA: Diagnosis not present

## 2020-05-09 DIAGNOSIS — H52203 Unspecified astigmatism, bilateral: Secondary | ICD-10-CM | POA: Diagnosis not present

## 2020-05-09 DIAGNOSIS — H2513 Age-related nuclear cataract, bilateral: Secondary | ICD-10-CM | POA: Diagnosis not present

## 2020-05-09 DIAGNOSIS — H5213 Myopia, bilateral: Secondary | ICD-10-CM | POA: Diagnosis not present

## 2020-05-13 ENCOUNTER — Telehealth: Payer: Self-pay | Admitting: Student

## 2020-05-13 NOTE — Telephone Encounter (Signed)
Returned pt call. No answer. Left msg to return call.

## 2020-05-13 NOTE — Telephone Encounter (Signed)
Pt called stating he wanted to speak with someone today. Pt did not state reason for calling.   Please call [phone]   Thanks renee

## 2020-05-18 NOTE — Telephone Encounter (Signed)
Pt calling to notify that he had paid his bill and to ask date and time of next appt.

## 2020-05-24 DIAGNOSIS — Z8674 Personal history of sudden cardiac arrest: Secondary | ICD-10-CM | POA: Diagnosis not present

## 2020-05-24 DIAGNOSIS — Z87891 Personal history of nicotine dependence: Secondary | ICD-10-CM | POA: Diagnosis not present

## 2020-05-24 DIAGNOSIS — I25118 Atherosclerotic heart disease of native coronary artery with other forms of angina pectoris: Secondary | ICD-10-CM | POA: Diagnosis not present

## 2020-05-24 DIAGNOSIS — M170 Bilateral primary osteoarthritis of knee: Secondary | ICD-10-CM | POA: Diagnosis not present

## 2020-05-26 DIAGNOSIS — R69 Illness, unspecified: Secondary | ICD-10-CM | POA: Diagnosis not present

## 2020-06-06 DIAGNOSIS — E6609 Other obesity due to excess calories: Secondary | ICD-10-CM | POA: Diagnosis not present

## 2020-06-06 DIAGNOSIS — I10 Essential (primary) hypertension: Secondary | ICD-10-CM | POA: Diagnosis not present

## 2020-06-06 DIAGNOSIS — Z6831 Body mass index (BMI) 31.0-31.9, adult: Secondary | ICD-10-CM | POA: Diagnosis not present

## 2020-06-23 DIAGNOSIS — E785 Hyperlipidemia, unspecified: Secondary | ICD-10-CM | POA: Diagnosis not present

## 2020-06-23 DIAGNOSIS — I10 Essential (primary) hypertension: Secondary | ICD-10-CM | POA: Diagnosis not present

## 2020-06-23 DIAGNOSIS — I25118 Atherosclerotic heart disease of native coronary artery with other forms of angina pectoris: Secondary | ICD-10-CM | POA: Diagnosis not present

## 2020-06-24 ENCOUNTER — Ambulatory Visit (INDEPENDENT_AMBULATORY_CARE_PROVIDER_SITE_OTHER): Payer: Medicare HMO | Admitting: Student

## 2020-06-24 ENCOUNTER — Encounter: Payer: Self-pay | Admitting: Student

## 2020-06-24 ENCOUNTER — Other Ambulatory Visit: Payer: Self-pay

## 2020-06-24 ENCOUNTER — Encounter: Payer: Self-pay | Admitting: *Deleted

## 2020-06-24 VITALS — BP 110/58 | HR 72 | Ht 71.75 in | Wt 231.6 lb

## 2020-06-24 DIAGNOSIS — I10 Essential (primary) hypertension: Secondary | ICD-10-CM

## 2020-06-24 DIAGNOSIS — E785 Hyperlipidemia, unspecified: Secondary | ICD-10-CM | POA: Diagnosis not present

## 2020-06-24 DIAGNOSIS — I25118 Atherosclerotic heart disease of native coronary artery with other forms of angina pectoris: Secondary | ICD-10-CM | POA: Diagnosis not present

## 2020-06-24 MED ORDER — LOSARTAN POTASSIUM-HCTZ 100-25 MG PO TABS: 1.0000 | ORAL_TABLET | Freq: Every day | ORAL | Status: DC

## 2020-06-24 NOTE — Patient Instructions (Signed)
Medication Instructions:   Continue current medication regimen.   Labwork:  Will request lab-work from your PCP.   Testing/Procedures:  None  Follow-Up:  With [place], PA-C or Dr. Harl Bowie in 1 year.   Any Other Special Instructions Will Be Listed Below (If Applicable).     If you need a refill on your cardiac medications before your next appointment, please call your pharmacy.

## 2020-06-24 NOTE — Progress Notes (Signed)
Cardiology Office Note    Date:  06/25/2020   ID:  Stephen, Graves Jul 03, 1956, MRN [id number]  PCP:  [facility]  Cardiologist: Carlyle Dolly, MD    Chief Complaint  Patient presents with  . Follow-up    Annual Visit    History of Present Illness:    Stephen Graves is a 64 y.o. male with past medical history of CAD (s/p DES to mid-distal LAD in 06/2017 with small septal branch jailed by stent placement), HTN and HLD who presents to the office today for annual follow-up.  He last had a telehealth visit with Dr. Bronson Ing in 05/2019 and denied any recent dyspnea or palpitations at that time. He did report having chronic chest pain which radiated into his back and had been relieved with drinking soda. He was continued on his current medication regimen including ASA 81 mg daily, Coreg 3.125 mg twice daily, Crestor 20 mg daily and Losartan-HCTZ 50-12.5 mg daily.  In talking with the patient today, he reports overall doing well from a cardiac perspective since his last visit. He reports episodes of a chronic pain which occurs from his lower back and radiates into his abdomen and chest which has been occurring for 30+ years. Says his pain typically goes away if he consumes a soda and does not resemble his prior anginal symptoms. He denies any recent chest pain resembling when he required stent placement in the past. No recent dyspnea on exertion, orthopnea, PND or lower extremity edema.  His PCP did increase his Losartan-HCTZ to an entire tablet within the past week given elevated BP. BP is well-controlled at 110/58 during today's visit.    Past Medical History:  Diagnosis Date  . Anxiety   . Coronary artery disease involving native coronary 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 ([place]) 07/18/2017   Cardiac cath 1020 1418: Mid LAD to Dist LAD lesion, 99 %stenosed. - PCI with resolute DES (2.5 X 26 mm Resolute Onyx stent - 2.75 mm);ostial  LAD 30%. Ostial ramus 40%. Proximal to mid LAD 50%. Normal LV function and normal LVEDP.   [name] Hiatal hernia   . High cholesterol   . HTN (hypertension)   . 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 Skull fracture [facility])     Past Surgical History:  Procedure Laterality Date  . CORONARY STENT INTERVENTION N/A 07/17/2017   Procedure: CORONARY STENT INTERVENTION;  Surgeon: Wellington Hampshire, MD;  Location: [place] [facility];  Service: Cardiovascular;  Laterality: N/A;  . FOOT SURGERY Left   . KNEE ARTHROPLASTY Left 07/30/2018   Procedure: LEFT TOTAL KNEE ARTHROPLASTY WITH COMPUTER NAVIGATION;  Surgeon: Rod Can, MD;  Location: [facility];  Service: Orthopedics;  Laterality: Left;  Needs RNFA  . LEFT HEART CATH AND CORONARY ANGIOGRAPHY N/A 07/17/2017   Procedure: LEFT HEART CATH AND CORONARY ANGIOGRAPHY;  Surgeon: Wellington Hampshire, MD;  Location: [place] [facility];  Service: Cardiovascular;  Laterality: N/A;  . left wrist surgery    . SKIN GRAFT  1998  . TONSILLECTOMY      Current Medications: Outpatient Medications Prior to Visit  Medication Sig Dispense Refill  . aspirin EC 81 MG tablet Take 81 mg by mouth daily.    . carvedilol (COREG) 3.125 MG tablet Take 1 tablet (3.125 mg total) by mouth 2 (two) times daily. 60 tablet 11  . citalopram (CELEXA) 40 MG tablet Take 40 mg by mouth daily.     [name]  fish oil-omega-3 fatty acids 1000 MG capsule Take 1 g by mouth daily.    . nitroGLYCERIN (NITROSTAT) 0.4 MG SL tablet Place 1 tablet (0.4 mg total) under the tongue every 5 (five) minutes as needed for chest pain. 25 tablet 3  . potassium chloride SA (K-DUR,KLOR-CON) 20 MEQ tablet Take 20 mEq by mouth daily.      . rosuvastatin (CRESTOR) 20 MG tablet TAKE 1 TABLET BY MOUTH ONCE DAILY AT  6PM 90 tablet 1  . losartan-hydrochlorothiazide (HYZAAR) 100-25 MG tablet Take 0.5 tablets by mouth daily.      No facility-administered medications prior to visit.     Allergies:   Lipitor  [atorvastatin]   Social History   Socioeconomic History  . Marital status: Divorced    Spouse name: Not on file  . Number of children: Not on file  . Years of education: Not on file  . Highest education level: Not on file  Occupational History  . Not on file  Tobacco Use  . Smoking status: Former Smoker    Types: Cigarettes    Quit date: 09/24/1988    Years since quitting: 31.7  . Smokeless tobacco: Never Used  . Tobacco comment: tobacco use - no  Vaping Use  . Vaping Use: Never used  Substance and Sexual Activity  . Alcohol use: No  . Drug use: No  . Sexual activity: Not on file  Other Topics Concern  . Not on file  Social History Narrative   Full time; single.    Social Determinants of Health   Financial Resource Strain:   . Difficulty of Paying Living Expenses: Not on file  Food Insecurity:   . Worried About Charity fundraiser in the Last Year: Not on file  . Ran Out of Food in the Last Year: Not on file  Transportation Needs:   . Lack of Transportation (Medical): Not on file  . Lack of Transportation (Non-Medical): Not on file  Physical Activity:   . Days of Exercise per Week: Not on file  . Minutes of Exercise per Session: Not on file  Stress:   . Feeling of Stress : Not on file  Social Connections:   . Frequency of Communication with Friends and Family: Not on file  . Frequency of Social Gatherings with Friends and Family: Not on file  . Attends Religious Services: Not on file  . Active Member of Clubs or Organizations: Not on file  . Attends Archivist Meetings: Not on file  . Marital Status: Not on file     Family History:  The patient's family history includes CAD in his mother; Heart attack (age of onset: 37) in his father; Prostate cancer in his brother; Stroke (age of onset: 21) in his father; Thyroid nodules in his brother.   Review of Systems:   Please see the history of present illness.     General:  No chills, fever, night sweats or  weight changes.  Cardiovascular:  No dyspnea on exertion, edema, orthopnea, palpitations, paroxysmal nocturnal dyspnea. Positive for chest pain.  Dermatological: No rash, lesions/masses Respiratory: No cough, dyspnea Urologic: No hematuria, dysuria Abdominal:   No nausea, vomiting, diarrhea, bright red blood per rectum, melena, or hematemesis Neurologic:  No visual changes, wkns, changes in mental status. All other systems reviewed and are otherwise negative except as noted above.   Physical Exam:    VS:  BP (!) 110/58   Pulse 72   Ht 5' 11.75" (1.822 m)  Wt 231 lb 9.6 oz (105.1 kg)   SpO2 97%   BMI 31.63 kg/m    General: Well developed, well nourished,male appearing in no acute distress. Head: Normocephalic, atraumatic. Neck: No carotid bruits. JVD not elevated.  Lungs: Respirations regular and unlabored, without wheezes or rales.  Heart: Regular rate and rhythm. No S3 or S4.  No murmur, no rubs, or gallops appreciated. Abdomen: Appears non-distended. No obvious abdominal masses. Msk:  Strength and tone appear normal for age. No obvious joint deformities or effusions. Extremities: No clubbing or cyanosis. No lower extremity edema.  Distal pedal pulses are 2+ bilaterally. Neuro: Alert and oriented X 3. Moves all extremities spontaneously. No focal deficits noted. Psych:  Responds to questions appropriately with a normal affect. Skin: No rashes or lesions noted  Wt Readings from Last 3 Encounters:  06/24/20 231 lb 9.6 oz (105.1 kg)  06/11/19 223 lb (101.2 kg)  10/30/18 223 lb (101.2 kg)     Studies/Labs Reviewed:   EKG:  EKG is ordered today.  The ekg ordered today demonstrates NSR, HR 76 with inferior infarct pattern and occasional PVC's.   Recent Labs: No results found for requested labs within last 8760 hours.   Lipid Panel    Component Value Date/Time   CHOL 111 11/05/2017 0848   TRIG 112 11/05/2017 0848   HDL 43 11/05/2017 0848   CHOLHDL 2.6 11/05/2017 0848    VLDL 22 11/05/2017 0848   LDLCALC 46 11/05/2017 0848    Additional studies/ records that were reviewed today include:   Echocardiogram: 06/2017 Study Conclusions   - Left ventricle: The cavity size was normal. Wall thickness was  increased in a pattern of mild LVH. Systolic function was normal.  The estimated ejection fraction was in the range of 60% to 65%.  Wall motion was normal; there were no regional wall motion  abnormalities. Diastolic dysfunction, grade indeterminate.  - Aortic valve: Moderately calcified annulus. Trileaflet.   Cardiac Catheterization: 06/2017  The left ventricular systolic function is normal.  LV end diastolic pressure is normal.  The left ventricular ejection fraction is 55-65% by visual estimate.  Ost LAD lesion, 30 %stenosed.  Ost Ramus to Ramus lesion, 40 %stenosed.  Prox LAD to Mid LAD lesion, 50 %stenosed.  A drug eluting stent was successfully placed.  Mid LAD to Dist LAD lesion, 99 %stenosed.  Post intervention, there is a 0% residual stenosis.   1.  Severe one-vessel coronary artery disease with 99% stenosis in the mid to distal LAD. 2.  Normal LV systolic function and normal left ventricular end-diastolic pressure. 3.  Successful angioplasty and drug-eluting stent placement to the mid/distal LAD.  A small septal branch was jailed by the stent with sluggish flow.  This resulted in chest pain and was treated with nitroglycerin drip.  Recommendations: Dual antiplatelet therapy for at least one year.  Aggressive treatment of risk factors.  Continue nitroglycerin drip as needed for chest pain.    Assessment:    1. Coronary artery disease of native artery of native heart with stable angina pectoris ([place])   2. Essential hypertension   3. Hyperlipidemia LDL goal <70      Plan:   In order of problems listed above:  1. CAD - He is s/p DES to mid-distal LAD in 06/2017 with small septal branch jailed by stent placement as  outlined above.  - He describes episodes of chest pain which radiates from his back into his abdomen and chest which has been occurring  for 30+ years and is relieved with soda. No recent exertional chest pain or symptoms resembling his prior angina. EKG today is without acute ST changes when compared to prior tracings.  - Continue current medication regimen with ASA 81 mg daily, Coreg 3.125 mg twice daily, and Crestor 20 mg daily.   2. HTN - BP is well-controlled at 110/58 during today's visit. Continue Coreg 3.125mg  BID and Losartan-HCTZ 100-25mg  daily.   3. HLD - LDL was at 46 in 10/2017 but no recent labs by review of Epic. Will request a copy of most recent labs from his PCP. Goal LDL is less than 70 with known CAD. He remains on Crestor 20mg  daily.     Medication Adjustments/Labs and Tests Ordered: Current medicines are reviewed at length with the patient today.  Concerns regarding medicines are outlined above.  Medication changes, Labs and Tests ordered today are listed in the Patient Instructions below. Patient Instructions  Medication Instructions:   Continue current medication regimen.   Labwork:  Will request lab-work from your PCP.   Testing/Procedures:  None  Follow-Up:  With [place], PA-C or Dr. Harl Bowie in 1 year.   Any Other Special Instructions Will Be Listed Below (If Applicable).   If you need a refill on your cardiac medications before your next appointment, please call your pharmacy.    Signed, Erma Heritage, PA-C  06/25/2020 7:59 AM    [facility] [address] [place], [place] [address] Phone: [phone] Fax: [phone]

## 2020-06-27 ENCOUNTER — Encounter: Payer: Self-pay | Admitting: Student

## 2020-07-04 DIAGNOSIS — M1711 Unilateral primary osteoarthritis, right knee: Secondary | ICD-10-CM | POA: Diagnosis not present

## 2020-08-10 DIAGNOSIS — Z0001 Encounter for general adult medical examination with abnormal findings: Secondary | ICD-10-CM | POA: Diagnosis not present

## 2020-08-10 DIAGNOSIS — Z6832 Body mass index (BMI) 32.0-32.9, adult: Secondary | ICD-10-CM | POA: Diagnosis not present

## 2020-08-10 DIAGNOSIS — E782 Mixed hyperlipidemia: Secondary | ICD-10-CM | POA: Diagnosis not present

## 2020-08-10 DIAGNOSIS — R69 Illness, unspecified: Secondary | ICD-10-CM | POA: Diagnosis not present

## 2020-08-10 DIAGNOSIS — Z1389 Encounter for screening for other disorder: Secondary | ICD-10-CM | POA: Diagnosis not present

## 2020-08-10 DIAGNOSIS — E785 Hyperlipidemia, unspecified: Secondary | ICD-10-CM | POA: Diagnosis not present

## 2020-08-10 DIAGNOSIS — I249 Acute ischemic heart disease, unspecified: Secondary | ICD-10-CM | POA: Diagnosis not present

## 2020-08-10 DIAGNOSIS — M1711 Unilateral primary osteoarthritis, right knee: Secondary | ICD-10-CM | POA: Diagnosis not present

## 2020-08-10 DIAGNOSIS — Z23 Encounter for immunization: Secondary | ICD-10-CM | POA: Diagnosis not present

## 2020-08-10 DIAGNOSIS — Z0000 Encounter for general adult medical examination without abnormal findings: Secondary | ICD-10-CM | POA: Diagnosis not present

## 2020-08-10 DIAGNOSIS — M1712 Unilateral primary osteoarthritis, left knee: Secondary | ICD-10-CM | POA: Diagnosis not present

## 2020-08-10 DIAGNOSIS — I25118 Atherosclerotic heart disease of native coronary artery with other forms of angina pectoris: Secondary | ICD-10-CM | POA: Diagnosis not present

## 2020-08-10 DIAGNOSIS — I10 Essential (primary) hypertension: Secondary | ICD-10-CM | POA: Diagnosis not present

## 2020-08-23 DIAGNOSIS — I10 Essential (primary) hypertension: Secondary | ICD-10-CM | POA: Diagnosis not present

## 2020-08-23 DIAGNOSIS — E785 Hyperlipidemia, unspecified: Secondary | ICD-10-CM | POA: Diagnosis not present

## 2020-08-23 DIAGNOSIS — I25118 Atherosclerotic heart disease of native coronary artery with other forms of angina pectoris: Secondary | ICD-10-CM | POA: Diagnosis not present

## 2020-09-19 ENCOUNTER — Telehealth: Payer: Self-pay | Admitting: Student

## 2020-09-19 MED ORDER — ROSUVASTATIN CALCIUM 20 MG PO TABS: 20.0000 mg | ORAL_TABLET | Freq: Every day | ORAL | 3 refills | Status: DC

## 2020-09-19 NOTE — Telephone Encounter (Signed)
Rosuvastatin 20 mg Tab Qty 90

## 2020-09-19 NOTE — Telephone Encounter (Signed)
Prescription sent to pharmacy.

## 2020-10-04 ENCOUNTER — Telehealth: Payer: Self-pay | Admitting: Student

## 2020-10-04 DIAGNOSIS — M1711 Unilateral primary osteoarthritis, right knee: Secondary | ICD-10-CM | POA: Diagnosis not present

## 2020-10-04 NOTE — Telephone Encounter (Signed)
° °  [facility] Pre-operative Risk Assessment    HEARTCARE STAFF: - Please ensure there is not already an duplicate clearance open for this procedure. - Under Visit Info/Reason for Call, type in Other and utilize the format Clearance MM/DD/YY or Clearance TBD. Do not use dashes or single digits. - If request is for dental extraction, please clarify the # of teeth to be extracted.  Request for surgical clearance:  What type of surgery is being performed? Right Total Knee Arthroplasty 1. When is this surgery scheduled? TBD  What type of clearance is required (medical clearance vs. Pharmacy clearance to hold med vs. Both)? Medical Are there any medications that need to be held prior to surgery and how long? Not specified Practice name and name of physician performing surgery? [facility], Dr. Lyla Glassing What is the office phone number? [phone]  7.   What is the office fax number? [phone]  8.   Anesthesia type (None, local, MAC, general) ? Spinal   Stephen Graves 10/04/2020, 12:00 PM  _________________________________________________________________   (provider comments below)

## 2020-10-04 NOTE — Telephone Encounter (Signed)
   Primary Cardiologist: Carlyle Dolly, MD  Chart reviewed as part of pre-operative protocol coverage. Patient was contacted 10/04/2020 in reference to pre-operative risk assessment for pending surgery as outlined below.  Stephen Graves was last seen on 10/04/2020 by [place] PA-C.  Since that day, AUGIE VANE has done well without chest pain or shortness of breath.  Therefore, based on ACC/AHA guidelines, the patient would be at acceptable risk for the planned procedure without further cardiovascular testing.   The patient was advised that if he develops new symptoms prior to surgery to contact our office to arrange for a follow-up visit, and he verbalized understanding.  I will route this recommendation to the requesting party via Epic fax function and remove from pre-op pool. Please call with questions.   He may hold fish oil for 1 week prior to the procedure and restart as soon as possible after the procedure.   [place], [place] 10/04/2020, 3:55 PM

## 2020-10-07 ENCOUNTER — Ambulatory Visit: Payer: Self-pay | Admitting: Student

## 2020-10-07 DIAGNOSIS — R69 Illness, unspecified: Secondary | ICD-10-CM | POA: Diagnosis not present

## 2020-10-07 DIAGNOSIS — I10 Essential (primary) hypertension: Secondary | ICD-10-CM | POA: Diagnosis not present

## 2020-10-07 DIAGNOSIS — Z6833 Body mass index (BMI) 33.0-33.9, adult: Secondary | ICD-10-CM | POA: Diagnosis not present

## 2020-10-07 DIAGNOSIS — M170 Bilateral primary osteoarthritis of knee: Secondary | ICD-10-CM | POA: Diagnosis not present

## 2020-10-07 DIAGNOSIS — Z01810 Encounter for preprocedural cardiovascular examination: Secondary | ICD-10-CM | POA: Diagnosis not present

## 2020-10-12 ENCOUNTER — Telehealth: Payer: Self-pay | Admitting: Student

## 2020-10-12 NOTE — Telephone Encounter (Signed)
   [facility] Pre-operative Risk Assessment    HEARTCARE STAFF: - Please ensure there is not already an duplicate clearance open for this procedure. - Under Visit Info/Reason for Call, type in Other and utilize the format Clearance MM/DD/YY or Clearance TBD. Do not use dashes or single digits. - If request is for dental extraction, please clarify the # of teeth to be extracted.  Request for surgical clearance:  1. What type of surgery is being performed? Right total knee orthoplasty  2. When is this surgery scheduled? 11/02/20  3. What type of clearance is required (medical clearance vs. Pharmacy clearance to hold med vs. Both)?  both  4. Are there any medications that need to be held prior to surgery and how long? Please advise   5. Practice name and name of physician performing surgery?  Dr Aaron Edelman [place]  6. What is the office phone number? [phone]   7.   What is the office fax number? [phone]  8.   Anesthesia type (None, local, MAC, general) ? spinal   Jannet Askew 10/12/2020, 11:49 AM  _________________________________________________________________   (provider comments below)

## 2020-10-13 NOTE — Telephone Encounter (Signed)
Duplicate clearance request received. Clearance request was faxed 10/04/20. Will fax original clearance to requesting party again and remove from preop box.   Loel Dubonnet, NP

## 2020-10-17 ENCOUNTER — Ambulatory Visit: Payer: Self-pay | Admitting: Student

## 2020-10-17 NOTE — H&P (Signed)
TOTAL KNEE ADMISSION H&P  Patient is being admitted for right total knee arthroplasty.  Subjective:  Chief Complaint:right knee pain.  HPI: Stephen Graves, 65 y.o. male, has a history of pain and functional disability in the right knee due to arthritis and has failed non-surgical conservative treatments for greater than 12 weeks to includeNSAID's and/or analgesics and corticosteriod injections.  Onset of symptoms was gradual, starting 4 years ago with gradually worsening course since that time. The patient noted no past surgery on the right knee(s).  Patient currently rates pain in the right knee(s) at 8 out of 10 with activity. Patient has worsening of pain with activity and weight bearing, pain that interferes with activities of daily living and pain with passive range of motion.  Patient has evidence of subchondral cysts, subchondral sclerosis and joint space narrowing by imaging studies. There is no active infection.  Patient Active Problem List   Diagnosis Date Noted  . Osteoarthritis of left knee 07/30/2018  . Coronary artery disease involving native coronary 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 ([place]) 07/18/2017  . Acute coronary syndrome ([place])   . Chest pain 07/16/2017  . HLD (hyperlipidemia) 07/16/2017  . Anxiety state 02/15/2010  . Essential hypertension 02/15/2010  . CHEST PAIN UNSPECIFIED 02/15/2010   Past Medical History:  Diagnosis Date  . Anxiety   . Coronary artery disease involving native coronary 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 ([place]) 07/18/2017   Cardiac cath 1020 1418: Mid LAD to Dist LAD lesion, 99 %stenosed. - PCI with resolute DES (2.5 X 26 mm Resolute Onyx stent - 2.75 mm);ostial LAD 30%. Ostial ramus 40%. Proximal to mid LAD 50%. Normal LV function and normal LVEDP.   [name] Hiatal hernia   . High cholesterol   . HTN (hypertension)   . 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 Skull fracture [facility])     Past Surgical  History:  Procedure Laterality Date  . CORONARY STENT INTERVENTION N/A 07/17/2017   Procedure: CORONARY STENT INTERVENTION;  Surgeon: Wellington Hampshire, MD;  Location: [place] [facility];  Service: Cardiovascular;  Laterality: N/A;  . FOOT SURGERY Left   . KNEE ARTHROPLASTY Left 07/30/2018   Procedure: LEFT TOTAL KNEE ARTHROPLASTY WITH COMPUTER NAVIGATION;  Surgeon: Rod Can, MD;  Location: [facility];  Service: Orthopedics;  Laterality: Left;  Needs RNFA  . LEFT HEART CATH AND CORONARY ANGIOGRAPHY N/A 07/17/2017   Procedure: LEFT HEART CATH AND CORONARY ANGIOGRAPHY;  Surgeon: Wellington Hampshire, MD;  Location: [place] [facility];  Service: Cardiovascular;  Laterality: N/A;  . left wrist surgery    . SKIN GRAFT  1998  . TONSILLECTOMY      Current Outpatient Medications  Medication Sig Dispense Refill Last Dose  . aspirin EC 81 MG tablet Take 81 mg by mouth daily.     . carvedilol (COREG) 3.125 MG tablet Take 3.125 mg by mouth 2 (two) times daily with a meal.     . citalopram (CELEXA) 40 MG tablet Take 40 mg by mouth daily.      . fish oil-omega-3 fatty acids 1000 MG capsule Take 1 g by mouth daily.     [name] losartan-hydrochlorothiazide (HYZAAR) 100-25 MG tablet Take 1 tablet by mouth daily.     . nitroGLYCERIN (NITROSTAT) 0.4 MG SL tablet Place 0.4 mg under the tongue every 5 (five) minutes as needed for chest pain.     . potassium chloride SA (K-DUR,KLOR-CON) 20  MEQ tablet Take 20 mEq by mouth daily.     . rosuvastatin (CRESTOR) 20 MG tablet Take 1 tablet (20 mg total) by mouth daily. 90 tablet 3    No current facility-administered medications for this visit.   Allergies  Allergen Reactions  . Lipitor [Atorvastatin]     Pain in neck and legs after taking medication    Social History   Tobacco Use  . Smoking status: Former Smoker    Types: Cigarettes    Quit date: 09/24/1988    Years since quitting: 32.0  . Smokeless tobacco: Never Used  . Tobacco comment: tobacco use - no   Substance Use Topics  . Alcohol use: No    Family History  Problem Relation Age of Onset  . Stroke Father 64  . Heart attack Father 43  . CAD Mother   . Prostate cancer Brother   . Thyroid nodules Brother      Review of Systems  Musculoskeletal: Positive for arthralgias.  All other systems reviewed and are negative.   Objective:  Physical Exam HENT:     Head: Normocephalic.  Eyes:     Pupils: Pupils are equal, round, and reactive to light.  Cardiovascular:     Rate and Rhythm: Normal rate and regular rhythm.     Pulses: Normal pulses.     Heart sounds: Normal heart sounds.  Pulmonary:     Breath sounds: Normal breath sounds.  Abdominal:     Palpations: Abdomen is soft.     Tenderness: There is no abdominal tenderness.  Genitourinary:    Comments: Deferred Musculoskeletal:     Comments: Examination the right knee reveals no skin wounds or lesions. He has minimal swelling, trace effusion. No warmth or erythema. Mild tenderness to palpation medial joint line and peripatellar retinacular tissues. Range of motion 12-110. No instability.  Skin:    General: Skin is warm and dry.  Neurological:     Mental Status: He is alert and oriented to person, place, and time.  Psychiatric:        Mood and Affect: Mood normal.     Vital signs in last 24 hours: @VSRANGES @  Labs:   Estimated body mass index is 31.63 kg/m as calculated from the following:   Height as of 06/24/20: 5' 11.75" (1.822 m).   Weight as of 06/24/20: 105.1 kg.   Imaging Review Plain radiographs demonstrate severe degenerative joint disease of the right knee(s). The overall alignment isneutral. The bone quality appears to be adequate for age and reported activity level.      Assessment/Plan:  End stage arthritis, right knee   The patient history, physical examination, clinical judgment of the provider and imaging studies are consistent with end stage degenerative joint disease of the right  knee(s) and total knee arthroplasty is deemed medically necessary. The treatment options including medical management, injection therapy arthroscopy and arthroplasty were discussed at length. The risks and benefits of total knee arthroplasty were presented and reviewed. The risks due to aseptic loosening, infection, stiffness, patella tracking problems, thromboembolic complications and other imponderables were discussed. The patient acknowledged the explanation, agreed to proceed with the plan and consent was signed. Patient is being admitted for inpatient treatment for surgery, pain control, PT, OT, prophylactic antibiotics, VTE prophylaxis, progressive ambulation and ADL's and discharge planning. The patient is planning to be discharged home with his sister on the same day    Patient's anticipated LOS is less than 2 midnights, meeting these requirements: - Lives  within 1 hour of care - Has a competent adult at home to recover with post-op recover - NO history of  - Chronic pain requiring opiods  - Diabetes  - Heart failure  - Heart attack  - Stroke  - DVT/VTE  - Cardiac arrhythmia  - Respiratory Failure/COPD  - Renal failure  - Anemia  - Advanced Liver disease

## 2020-10-17 NOTE — H&P (View-Only) (Signed)
TOTAL KNEE ADMISSION H&P  Patient is being admitted for right total knee arthroplasty.  Subjective:  Chief Complaint:right knee pain.  HPI: Stephen Graves, 65 y.o. male, has a history of pain and functional disability in the right knee due to arthritis and has failed non-surgical conservative treatments for greater than 12 weeks to includeNSAID's and/or analgesics and corticosteriod injections.  Onset of symptoms was gradual, starting 4 years ago with gradually worsening course since that time. The patient noted no past surgery on the right knee(s).  Patient currently rates pain in the right knee(s) at 8 out of 10 with activity. Patient has worsening of pain with activity and weight bearing, pain that interferes with activities of daily living and pain with passive range of motion.  Patient has evidence of subchondral cysts, subchondral sclerosis and joint space narrowing by imaging studies. There is no active infection.  Patient Active Problem List   Diagnosis Date Noted  . Osteoarthritis of left knee 07/30/2018  . Coronary artery disease involving native coronary 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 ([place]) 07/18/2017  . Acute coronary syndrome ([place])   . Chest pain 07/16/2017  . HLD (hyperlipidemia) 07/16/2017  . Anxiety state 02/15/2010  . Essential hypertension 02/15/2010  . CHEST PAIN UNSPECIFIED 02/15/2010   Past Medical History:  Diagnosis Date  . Anxiety   . Coronary artery disease involving native coronary 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 ([place]) 07/18/2017   Cardiac cath 1020 1418: Mid LAD to Dist LAD lesion, 99 %stenosed. - PCI with resolute DES (2.5 X 26 mm Resolute Onyx stent - 2.75 mm);ostial LAD 30%. Ostial ramus 40%. Proximal to mid LAD 50%. Normal LV function and normal LVEDP.   [name] Hiatal hernia   . High cholesterol   . HTN (hypertension)   . 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 Skull fracture [facility])     Past Surgical  History:  Procedure Laterality Date  . CORONARY STENT INTERVENTION N/A 07/17/2017   Procedure: CORONARY STENT INTERVENTION;  Surgeon: Wellington Hampshire, MD;  Location: [place] [facility];  Service: Cardiovascular;  Laterality: N/A;  . FOOT SURGERY Left   . KNEE ARTHROPLASTY Left 07/30/2018   Procedure: LEFT TOTAL KNEE ARTHROPLASTY WITH COMPUTER NAVIGATION;  Surgeon: Rod Can, MD;  Location: [facility];  Service: Orthopedics;  Laterality: Left;  Needs RNFA  . LEFT HEART CATH AND CORONARY ANGIOGRAPHY N/A 07/17/2017   Procedure: LEFT HEART CATH AND CORONARY ANGIOGRAPHY;  Surgeon: Wellington Hampshire, MD;  Location: [place] [facility];  Service: Cardiovascular;  Laterality: N/A;  . left wrist surgery    . SKIN GRAFT  1998  . TONSILLECTOMY      Current Outpatient Medications  Medication Sig Dispense Refill Last Dose  . aspirin EC 81 MG tablet Take 81 mg by mouth daily.     . carvedilol (COREG) 3.125 MG tablet Take 3.125 mg by mouth 2 (two) times daily with a meal.     . citalopram (CELEXA) 40 MG tablet Take 40 mg by mouth daily.      . fish oil-omega-3 fatty acids 1000 MG capsule Take 1 g by mouth daily.     [name] losartan-hydrochlorothiazide (HYZAAR) 100-25 MG tablet Take 1 tablet by mouth daily.     . nitroGLYCERIN (NITROSTAT) 0.4 MG SL tablet Place 0.4 mg under the tongue every 5 (five) minutes as needed for chest pain.     . potassium chloride SA (K-DUR,KLOR-CON) 20  MEQ tablet Take 20 mEq by mouth daily.     . rosuvastatin (CRESTOR) 20 MG tablet Take 1 tablet (20 mg total) by mouth daily. 90 tablet 3    No current facility-administered medications for this visit.   Allergies  Allergen Reactions  . Lipitor [Atorvastatin]     Pain in neck and legs after taking medication    Social History   Tobacco Use  . Smoking status: Former Smoker    Types: Cigarettes    Quit date: 09/24/1988    Years since quitting: 32.0  . Smokeless tobacco: Never Used  . Tobacco comment: tobacco use - no   Substance Use Topics  . Alcohol use: No    Family History  Problem Relation Age of Onset  . Stroke Father 55  . Heart attack Father 29  . CAD Mother   . Prostate cancer Brother   . Thyroid nodules Brother      Review of Systems  Musculoskeletal: Positive for arthralgias.  All other systems reviewed and are negative.   Objective:  Physical Exam HENT:     Head: Normocephalic.  Eyes:     Pupils: Pupils are equal, round, and reactive to light.  Cardiovascular:     Rate and Rhythm: Normal rate and regular rhythm.     Pulses: Normal pulses.     Heart sounds: Normal heart sounds.  Pulmonary:     Breath sounds: Normal breath sounds.  Abdominal:     Palpations: Abdomen is soft.     Tenderness: There is no abdominal tenderness.  Genitourinary:    Comments: Deferred Musculoskeletal:     Comments: Examination the right knee reveals no skin wounds or lesions. He has minimal swelling, trace effusion. No warmth or erythema. Mild tenderness to palpation medial joint line and peripatellar retinacular tissues. Range of motion 12-110. No instability.  Skin:    General: Skin is warm and dry.  Neurological:     Mental Status: He is alert and oriented to person, place, and time.  Psychiatric:        Mood and Affect: Mood normal.     Vital signs in last 24 hours: @VSRANGES @  Labs:   Estimated body mass index is 31.63 kg/m as calculated from the following:   Height as of 06/24/20: 5' 11.75" (1.822 m).   Weight as of 06/24/20: 105.1 kg.   Imaging Review Plain radiographs demonstrate severe degenerative joint disease of the right knee(s). The overall alignment isneutral. The bone quality appears to be adequate for age and reported activity level.      Assessment/Plan:  End stage arthritis, right knee   The patient history, physical examination, clinical judgment of the provider and imaging studies are consistent with end stage degenerative joint disease of the right  knee(s) and total knee arthroplasty is deemed medically necessary. The treatment options including medical management, injection therapy arthroscopy and arthroplasty were discussed at length. The risks and benefits of total knee arthroplasty were presented and reviewed. The risks due to aseptic loosening, infection, stiffness, patella tracking problems, thromboembolic complications and other imponderables were discussed. The patient acknowledged the explanation, agreed to proceed with the plan and consent was signed. Patient is being admitted for inpatient treatment for surgery, pain control, PT, OT, prophylactic antibiotics, VTE prophylaxis, progressive ambulation and ADL's and discharge planning. The patient is planning to be discharged home with his sister on the same day    Patient's anticipated LOS is less than 2 midnights, meeting these requirements: - Lives  within 1 hour of care - Has a competent adult at home to recover with post-op recover - NO history of  - Chronic pain requiring opiods  - Diabetes  - Heart failure  - Heart attack  - Stroke  - DVT/VTE  - Cardiac arrhythmia  - Respiratory Failure/COPD  - Renal failure  - Anemia  - Advanced Liver disease

## 2020-10-21 NOTE — Patient Instructions (Addendum)
DUE TO COVID-19 ONLY ONE VISITOR IS ALLOWED TO COME WITH YOU AND STAY IN THE WAITING ROOM ONLY DURING PRE OP AND PROCEDURE DAY OF SURGERY. THE 1 VISITOR  MAY VISIT WITH YOU AFTER SURGERY IN YOUR PRIVATE ROOM DURING VISITING HOURS ONLY!  YOU NEED TO HAVE A COVID 19 TEST ON_2/5______ @_11 :45______, THIS TEST MUST BE DONE BEFORE SURGERY,  COVID TESTING SITE [address]  [address], IT IS ON THE RIGHT GOING OUT [address] APPROXIMATELY  2 MINUTES PAST [employer] ON THE RIGHT. ONCE YOUR COVID TEST IS COMPLETED,  PLEASE BEGIN THE QUARANTINE INSTRUCTIONS AS OUTLINED IN YOUR HANDOUT.                Vick Frees   Your procedure is scheduled on: 11/02/20   Report to [facility] Main  Entrance   Report to admitting at  8:35 AM     Call this number if you have problems the morning of surgery [phone]   t. BRUSH YOUR TEETH MORNING OF SURGERY AND RINSE YOUR MOUTH OUT, NO CHEWING GUM CANDY OR MINTS.   No food after midnight.    You may have clear liquid until 8:00 AM.    At 7:30 AM drink pre surgery drink.   Nothing by mouth after 8:00 AM.   Take these medicines the morning of surgery with A SIP OF WATER: Citalopram, Carvedilol                                 You may not have any metal on your body including hair pins and              piercings  Do not wear jewelry,  lotions, powders or deodorant                       Men may shave face and neck.   Do not bring valuables to the hospital. [place].  Contacts, dentures or bridgework may not be worn into surgery.       Patients discharged the day of surgery will not be allowed to drive home.   IF YOU ARE HAVING SURGERY AND GOING HOME THE SAME DAY, YOU MUST HAVE AN ADULT TO DRIVE YOU HOME AND BE WITH YOU FOR 24 HOURS.   YOU MAY GO HOME BY TAXI OR UBER OR ORTHERWISE, BUT AN ADULT MUST ACCOMPANY YOU HOME AND STAY WITH YOU FOR 24  HOURS.  Name and phone number of your driver:  Special Instructions: N/A              Please read over the following fact sheets you were given: _____________________________________________________________________             [facility] - Preparing for Surgery Before surgery, you can play an important role.   Because skin is not sterile, your skin needs to be as free of germs as possible.   You can reduce the number of germs on your skin by washing with CHG (chlorahexidine gluconate) soap before surgery.   CHG is an antiseptic cleaner which kills germs and bonds with the skin to continue killing germs even after washing. Please DO NOT use if you have an allergy to CHG or antibacterial soaps.   If your skin becomes reddened/irritated  stop using the CHG and inform your nurse when you arrive at Short Stay.  You may shave your face/neck.  Please follow these instructions carefully:  1.  Shower with CHG Soap the night before surgery and the  morning of Surgery.  2.  If you choose to wash your hair, wash your hair first as usual with your  normal  shampoo.  3.  After you shampoo, rinse your hair and body thoroughly to remove the  shampoo.                                        4.  Use CHG as you would any other liquid soap.  You can apply chg directly  to the skin and wash                       Gently with a scrungie or clean washcloth.  5.  Apply the CHG Soap to your body ONLY FROM THE NECK DOWN.   Do not use on face/ open                           Wound or open sores. Avoid contact with eyes, ears mouth and genitals (private parts).                       Wash face,  Genitals (private parts) with your normal soap.             6.  Wash thoroughly, paying special attention to the area where your surgery  will be performed.  7.  Thoroughly rinse your body with warm water from the neck down.  8.  DO NOT shower/wash with your normal soap after using and rinsing off  the CHG Soap.               9.  Pat yourself dry with a clean towel.            10.  Wear clean pajamas.            11.  Place clean sheets on your bed the night of your first shower and do not  sleep with pets. Day of Surgery : Do not apply any lotions/deodorants the morning of surgery.  Please wear clean clothes to the hospital/surgery center.  FAILURE TO FOLLOW THESE INSTRUCTIONS MAY RESULT IN THE CANCELLATION OF YOUR SURGERY PATIENT SIGNATURE_________________________________  NURSE SIGNATURE__________________________________  ________________________________________________________________________   [name]  An incentive spirometer is a tool that can help keep your lungs clear and active. This tool measures how well you are filling your lungs with each breath. Taking long deep breaths may help reverse or decrease the chance of developing breathing (pulmonary) problems (especially infection) following:  A long period of time when you are unable to move or be active. BEFORE THE PROCEDURE   If the spirometer includes an indicator to show your best effort, your nurse or respiratory therapist will set it to a desired goal.  If possible, sit up straight or lean slightly forward. Try not to slouch.  Hold the incentive spirometer in an upright position. INSTRUCTIONS FOR USE  1. Sit on the edge of your bed if possible, or sit up as far as you can in bed or on a chair. 2. Hold the incentive spirometer in an upright position. 3.  Breathe out normally. 4. Place the mouthpiece in your mouth and seal your lips tightly around it. 5. Breathe in slowly and as deeply as possible, raising the piston or the ball toward the top of the column. 6. Hold your breath for 3-5 seconds or for as long as possible. Allow the piston or ball to fall to the bottom of the column. 7. Remove the mouthpiece from your mouth and breathe out normally. 8. Rest for a few seconds and repeat Steps 1 through 7 at least 10 times every 1-2  hours when you are awake. Take your time and take a few normal breaths between deep breaths. 9. The spirometer may include an indicator to show your best effort. Use the indicator as a goal to work toward during each repetition. 10. After each set of 10 deep breaths, practice coughing to be sure your lungs are clear. If you have an incision (the cut made at the time of surgery), support your incision when coughing by placing a pillow or rolled up towels firmly against it. Once you are able to get out of bed, walk around indoors and cough well. You may stop using the incentive spirometer when instructed by your caregiver.  RISKS AND COMPLICATIONS  Take your time so you do not get dizzy or light-headed.  If you are in pain, you may need to take or ask for pain medication before doing incentive spirometry. It is harder to take a deep breath if you are having pain. AFTER USE  Rest and breathe slowly and easily.  It can be helpful to keep track of a log of your progress. Your caregiver can provide you with a simple table to help with this. If you are using the spirometer at home, follow these instructions: [place] IF:   You are having difficultly using the spirometer.  You have trouble using the spirometer as often as instructed.  Your pain medication is not giving enough relief while using the spirometer.  You develop fever of 100.5 F (38.1 C) or higher. SEEK IMMEDIATE MEDICAL CARE IF:   You cough up bloody sputum that had not been present before.  You develop fever of 102 F (38.9 C) or greater.  You develop worsening pain at or near the incision site. MAKE SURE YOU:   Understand these instructions.  Will watch your condition.  Will get help right away if you are not doing well or get worse. Document Released: 01/21/2007 Document Revised: 12/03/2011 Document Reviewed: 03/24/2007 [facility] Patient Information 2014  [place].   ________________________________________________________________________

## 2020-10-25 ENCOUNTER — Encounter (HOSPITAL_COMMUNITY): Payer: Self-pay

## 2020-10-25 ENCOUNTER — Encounter (HOSPITAL_COMMUNITY)
Admission: RE | Admit: 2020-10-25 | Discharge: 2020-10-25 | Disposition: A | Discharge status: Home / Self Care | Payer: Medicare HMO | Source: Ambulatory Visit | Attending: Orthopedic Surgery | Admitting: Orthopedic Surgery

## 2020-10-25 ENCOUNTER — Other Ambulatory Visit: Payer: Self-pay

## 2020-10-25 DIAGNOSIS — Z01812 Encounter for preprocedural laboratory examination: Secondary | ICD-10-CM | POA: Diagnosis not present

## 2020-10-25 LAB — URINALYSIS, ROUTINE W REFLEX MICROSCOPIC
Bilirubin Urine: NEGATIVE
Glucose, UA: NEGATIVE mg/dL
Hgb urine dipstick: NEGATIVE
Ketones, ur: NEGATIVE mg/dL
Leukocytes,Ua: NEGATIVE
Nitrite: NEGATIVE
Protein, ur: 30 mg/dL — AB
Specific Gravity, Urine: 1.029 (ref 1.005–1.030)
pH: 5 (ref 5.0–8.0)

## 2020-10-25 LAB — COMPREHENSIVE METABOLIC PANEL
ALT: 17 U/L (ref 0–44)
AST: 20 U/L (ref 15–41)
Albumin: 3.8 g/dL (ref 3.5–5.0)
Alkaline Phosphatase: 79 U/L (ref 38–126)
Anion gap: 9 (ref 5–15)
BUN: 30 mg/dL — ABNORMAL HIGH (ref 8–23)
CO2: 24 mmol/L (ref 22–32)
Calcium: 9.2 mg/dL (ref 8.9–10.3)
Chloride: 106 mmol/L (ref 98–111)
Creatinine, Ser: 1.3 mg/dL — ABNORMAL HIGH (ref 0.61–1.24)
GFR, Estimated: >60 mL/min (ref >60)
Glucose, Bld: 115 mg/dL — ABNORMAL HIGH (ref 70–99)
Potassium: 4.1 mmol/L (ref 3.5–5.1)
Sodium: 139 mmol/L (ref 135–145)
Total Bilirubin: 0.7 mg/dL (ref 0.3–1.2)
Total Protein: 7 g/dL (ref 6.5–8.1)

## 2020-10-25 LAB — PROTIME-INR
INR: 1.1 (ref 0.8–1.2)
Prothrombin Time: 13.9 seconds (ref 11.4–15.2)

## 2020-10-25 LAB — CBC
HCT: 39.6 % (ref 39.0–52.0)
Hemoglobin: 12.7 g/dL — ABNORMAL LOW (ref 13.0–17.0)
MCH: 28.5 pg (ref 26.0–34.0)
MCHC: 32.1 g/dL (ref 30.0–36.0)
MCV: 89 fL (ref 80.0–100.0)
Platelets: 119 10*3/uL — ABNORMAL LOW (ref 150–400)
RBC: 4.45 MIL/uL (ref 4.22–5.81)
RDW: 15 % (ref 11.5–15.5)
WBC: 4.5 10*3/uL (ref 4.0–10.5)
nRBC: 0 % (ref 0.0–0.2)

## 2020-10-25 LAB — SURGICAL PCR SCREEN
MRSA, PCR: NEGATIVE
Staphylococcus aureus: NEGATIVE

## 2020-10-25 NOTE — Progress Notes (Signed)
COVID Vaccine Completed:Yes Date COVID Vaccine completed:11/16/19 -Booster 07/15/20 COVID vaccine manufacturer:   Moderna     PCP - Dr. Joeseph Amor Cardiologist - Dr. Zandra Abts  Chest x-ray - no EKG - 06/24/20-epic Stress Test - no ECHO - 07/17/20-epic Cardiac Cath - with stents 07/17/17-epic Pacemaker/ICD device last checked:NA  Sleep Study - no CPAP -   Fasting Blood Sugar - NA Checks Blood Sugar _____ times a day  Blood Thinner Instructions:ASA 81/ Dr. Armandina Gemma Aspirin Instructions:stop 5 days prior to DOS/ Dr. Lyla Glassing Last Dose:10/28/20  Anesthesia review:   Patient denies shortness of breath, fever, cough and chest pain at PAT appointment  yes Patient verbalized understanding of instructions that were given to them at the PAT appointment. Patient was also instructed that they will need to review over the PAT instructions again at home before surgery. Yes Pt states that he can climb stairs slowly ,do housework and ADLs without SOB. 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

## 2020-10-29 ENCOUNTER — Other Ambulatory Visit (HOSPITAL_COMMUNITY)
Admission: RE | Admit: 2020-10-29 | Discharge: 2020-10-29 | Disposition: A | Discharge status: Home / Self Care | Payer: Medicare HMO | Source: Ambulatory Visit | Attending: Orthopedic Surgery | Admitting: Orthopedic Surgery

## 2020-10-29 DIAGNOSIS — Z01812 Encounter for preprocedural laboratory examination: Secondary | ICD-10-CM | POA: Insufficient documentation

## 2020-10-29 DIAGNOSIS — Z20822 Contact with and (suspected) exposure to covid-19: Secondary | ICD-10-CM | POA: Diagnosis not present

## 2020-10-29 LAB — SARS CORONAVIRUS 2 (TAT 6-24 HRS): SARS Coronavirus 2: NEGATIVE

## 2020-11-01 NOTE — Progress Notes (Signed)
Called patient about time change for 11/02/20 surgery. Patient to arrive 0745 and complete his ERAS drink by 0715. He verbalizes understanding.

## 2020-11-02 ENCOUNTER — Observation Stay (HOSPITAL_COMMUNITY)
Admission: RE | Admit: 2020-11-02 | Discharge: 2020-11-04 | Disposition: A | Discharge status: Home / Self Care | Payer: Medicare HMO | Source: Other Acute Inpatient Hospital | Attending: Orthopedic Surgery | Admitting: Orthopedic Surgery

## 2020-11-02 ENCOUNTER — Ambulatory Visit (HOSPITAL_COMMUNITY): Payer: Medicare HMO

## 2020-11-02 ENCOUNTER — Ambulatory Visit (HOSPITAL_COMMUNITY): Payer: Medicare HMO | Admitting: Anesthesiology

## 2020-11-02 ENCOUNTER — Encounter (HOSPITAL_COMMUNITY)
Admission: RE | Disposition: A | Discharge status: Home / Self Care | Payer: Self-pay | Source: Other Acute Inpatient Hospital | Attending: Orthopedic Surgery

## 2020-11-02 ENCOUNTER — Encounter (HOSPITAL_COMMUNITY): Payer: Self-pay | Admitting: Orthopedic Surgery

## 2020-11-02 DIAGNOSIS — Z96651 Presence of right artificial knee joint: Secondary | ICD-10-CM | POA: Diagnosis not present

## 2020-11-02 DIAGNOSIS — Z7982 Long term (current) use of aspirin: Secondary | ICD-10-CM | POA: Diagnosis not present

## 2020-11-02 DIAGNOSIS — I25110 Atherosclerotic heart disease of native coronary artery with unstable angina pectoris: Secondary | ICD-10-CM | POA: Insufficient documentation

## 2020-11-02 DIAGNOSIS — M1711 Unilateral primary osteoarthritis, right knee: Principal | ICD-10-CM | POA: Diagnosis present

## 2020-11-02 DIAGNOSIS — R600 Localized edema: Secondary | ICD-10-CM | POA: Diagnosis not present

## 2020-11-02 DIAGNOSIS — Z87891 Personal history of nicotine dependence: Secondary | ICD-10-CM | POA: Insufficient documentation

## 2020-11-02 DIAGNOSIS — Z79899 Other long term (current) drug therapy: Secondary | ICD-10-CM | POA: Insufficient documentation

## 2020-11-02 DIAGNOSIS — E785 Hyperlipidemia, unspecified: Secondary | ICD-10-CM | POA: Diagnosis not present

## 2020-11-02 DIAGNOSIS — I10 Essential (primary) hypertension: Secondary | ICD-10-CM | POA: Insufficient documentation

## 2020-11-02 DIAGNOSIS — G8918 Other acute postprocedural pain: Secondary | ICD-10-CM | POA: Diagnosis not present

## 2020-11-02 DIAGNOSIS — I249 Acute ischemic heart disease, unspecified: Secondary | ICD-10-CM | POA: Insufficient documentation

## 2020-11-02 DIAGNOSIS — Z471 Aftercare following joint replacement surgery: Secondary | ICD-10-CM | POA: Diagnosis not present

## 2020-11-02 HISTORY — PX: KNEE ARTHROPLASTY: SHX992

## 2020-11-02 LAB — TYPE AND SCREEN
ABO/RH(D): O POS
Antibody Screen: NEGATIVE

## 2020-11-02 SURGERY — ARTHROPLASTY, KNEE, TOTAL, USING IMAGELESS COMPUTER-ASSISTED NAVIGATION
Anesthesia: Spinal | Site: Knee | Laterality: Right

## 2020-11-02 MED ORDER — KETOROLAC TROMETHAMINE 30 MG/ML IJ SOLN
INTRAMUSCULAR | Status: DC | PRN
  Administered 2020-11-02: 30 mg via INTRA_ARTICULAR

## 2020-11-02 MED ORDER — SODIUM CHLORIDE (PF) 0.9 % IJ SOLN
INTRAMUSCULAR | Status: DC | PRN
  Administered 2020-11-02: 30 mL

## 2020-11-02 MED ORDER — CHLORHEXIDINE GLUCONATE 0.12 % MT SOLN
15.0000 mL | Freq: Once | OROMUCOSAL | Status: AC
  Administered 2020-11-02: 15 mL via OROMUCOSAL

## 2020-11-02 MED ORDER — MORPHINE SULFATE (PF) 2 MG/ML IV SOLN: 0.5000 mg | INTRAVENOUS | Status: DC | PRN

## 2020-11-02 MED ORDER — DEXAMETHASONE SODIUM PHOSPHATE 10 MG/ML IJ SOLN
10.0000 mg | Freq: Once | INTRAMUSCULAR | Status: AC
  Administered 2020-11-03: 10 mg via INTRAVENOUS
  Filled 2020-11-02: qty 1

## 2020-11-02 MED ORDER — POVIDONE-IODINE 10 % EX SWAB
2.0000 "application " | Freq: Once | CUTANEOUS | Status: AC
  Administered 2020-11-02: 2 via TOPICAL

## 2020-11-02 MED ORDER — ACETAMINOPHEN 10 MG/ML IV SOLN
1000.0000 mg | Freq: Once | INTRAVENOUS | Status: AC
  Administered 2020-11-02: 1000 mg via INTRAVENOUS
  Filled 2020-11-02: qty 100

## 2020-11-02 MED ORDER — SODIUM CHLORIDE 0.9 % IR SOLN
Status: DC | PRN
  Administered 2020-11-02: 1000 mL

## 2020-11-02 MED ORDER — METHOCARBAMOL 500 MG PO TABS
500.0000 mg | ORAL_TABLET | Freq: Four times a day (QID) | ORAL | Status: DC | PRN
  Administered 2020-11-03: 500 mg via ORAL
  Filled 2020-11-02: qty 1

## 2020-11-02 MED ORDER — ALUM & MAG HYDROXIDE-SIMETH 200-200-20 MG/5ML PO SUSP: 30.0000 mL | ORAL | Status: DC | PRN

## 2020-11-02 MED ORDER — PROPOFOL 10 MG/ML IV BOLUS
INTRAVENOUS | Status: DC | PRN
  Administered 2020-11-02: 20 mg via INTRAVENOUS

## 2020-11-02 MED ORDER — PROPOFOL 1000 MG/100ML IV EMUL
INTRAVENOUS | Status: AC
  Filled 2020-11-02: qty 100

## 2020-11-02 MED ORDER — ONDANSETRON HCL 4 MG PO TABS: 4.0000 mg | ORAL_TABLET | Freq: Four times a day (QID) | ORAL | Status: DC | PRN

## 2020-11-02 MED ORDER — POLYETHYLENE GLYCOL 3350 17 G PO PACK: 17.0000 g | PACK | Freq: Every day | ORAL | Status: DC | PRN

## 2020-11-02 MED ORDER — CEFAZOLIN SODIUM-DEXTROSE 2-4 GM/100ML-% IV SOLN
2.0000 g | INTRAVENOUS | Status: AC
  Administered 2020-11-02: 2 g via INTRAVENOUS
  Filled 2020-11-02: qty 100

## 2020-11-02 MED ORDER — DOCUSATE SODIUM 100 MG PO CAPS
100.0000 mg | ORAL_CAPSULE | Freq: Two times a day (BID) | ORAL | Status: DC
  Administered 2020-11-02 – 2020-11-04 (×4): 100 mg via ORAL
  Filled 2020-11-02 (×4): qty 1

## 2020-11-02 MED ORDER — POVIDONE-IODINE 10 % EX SWAB: 2.0000 "application " | Freq: Once | CUTANEOUS | Status: DC

## 2020-11-02 MED ORDER — SENNA 8.6 MG PO TABS
1.0000 | ORAL_TABLET | Freq: Two times a day (BID) | ORAL | Status: DC
  Administered 2020-11-02 – 2020-11-04 (×3): 8.6 mg via ORAL
  Filled 2020-11-02 (×4): qty 1

## 2020-11-02 MED ORDER — ONDANSETRON HCL 4 MG/2ML IJ SOLN: 4.0000 mg | Freq: Four times a day (QID) | INTRAMUSCULAR | Status: DC | PRN

## 2020-11-02 MED ORDER — CLONIDINE HCL (ANALGESIA) 100 MCG/ML EP SOLN
EPIDURAL | Status: DC | PRN
  Administered 2020-11-02: 80 ug

## 2020-11-02 MED ORDER — ACETAMINOPHEN 325 MG PO TABS
325.0000 mg | ORAL_TABLET | Freq: Four times a day (QID) | ORAL | Status: DC | PRN
Start: 2020-11-03 — End: 2020-11-04

## 2020-11-02 MED ORDER — METOCLOPRAMIDE HCL 5 MG PO TABS
5.0000 mg | ORAL_TABLET | Freq: Three times a day (TID) | ORAL | Status: DC | PRN
Start: 2020-11-02 — End: 2020-11-04

## 2020-11-02 MED ORDER — SODIUM CHLORIDE 0.9 % IV SOLN: INTRAVENOUS | Status: DC

## 2020-11-02 MED ORDER — MENTHOL 3 MG MT LOZG
1.0000 | LOZENGE | OROMUCOSAL | Status: DC | PRN
Start: 2020-11-02 — End: 2020-11-04

## 2020-11-02 MED ORDER — TRANEXAMIC ACID-NACL 1000-0.7 MG/100ML-% IV SOLN
1000.0000 mg | INTRAVENOUS | Status: AC
  Administered 2020-11-02: 1000 mg via INTRAVENOUS
  Filled 2020-11-02: qty 100

## 2020-11-02 MED ORDER — LACTATED RINGERS IV BOLUS: 250.0000 mL | Freq: Once | INTRAVENOUS | Status: DC

## 2020-11-02 MED ORDER — PROPOFOL 500 MG/50ML IV EMUL
INTRAVENOUS | Status: DC | PRN
  Administered 2020-11-02: 85 ug/kg/min via INTRAVENOUS

## 2020-11-02 MED ORDER — HYDROCODONE-ACETAMINOPHEN 5-325 MG PO TABS: 1.0000 | ORAL_TABLET | ORAL | 0 refills | Status: DC | PRN

## 2020-11-02 MED ORDER — BUPIVACAINE-EPINEPHRINE (PF) 0.5% -1:200000 IJ SOLN
INTRAMUSCULAR | Status: DC | PRN
  Administered 2020-11-02: 30 mL via PERINEURAL

## 2020-11-02 MED ORDER — ONDANSETRON HCL 4 MG PO TABS: 4.0000 mg | ORAL_TABLET | Freq: Three times a day (TID) | ORAL | 0 refills | Status: DC | PRN

## 2020-11-02 MED ORDER — ASPIRIN 81 MG PO CHEW
81.0000 mg | CHEWABLE_TABLET | Freq: Two times a day (BID) | ORAL | Status: DC
  Administered 2020-11-02 – 2020-11-04 (×4): 81 mg via ORAL
  Filled 2020-11-02 (×4): qty 1

## 2020-11-02 MED ORDER — METHOCARBAMOL 1000 MG/10ML IJ SOLN
500.0000 mg | Freq: Four times a day (QID) | INTRAVENOUS | Status: DC | PRN
  Filled 2020-11-02: qty 5

## 2020-11-02 MED ORDER — CARVEDILOL 3.125 MG PO TABS
3.1250 mg | ORAL_TABLET | Freq: Two times a day (BID) | ORAL | Status: DC
  Administered 2020-11-03 – 2020-11-04 (×2): 3.125 mg via ORAL
  Filled 2020-11-02 (×2): qty 1

## 2020-11-02 MED ORDER — PHENOL 1.4 % MT LIQD: 1.0000 | OROMUCOSAL | Status: DC | PRN

## 2020-11-02 MED ORDER — KETOROLAC TROMETHAMINE 15 MG/ML IJ SOLN
7.5000 mg | Freq: Four times a day (QID) | INTRAMUSCULAR | Status: AC
  Administered 2020-11-02 – 2020-11-03 (×3): 7.5 mg via INTRAVENOUS
  Filled 2020-11-02 (×3): qty 1

## 2020-11-02 MED ORDER — LACTATED RINGERS IV SOLN: INTRAVENOUS | Status: DC

## 2020-11-02 MED ORDER — METOCLOPRAMIDE HCL 5 MG/ML IJ SOLN
5.0000 mg | Freq: Three times a day (TID) | INTRAMUSCULAR | Status: DC | PRN
Start: 2020-11-02 — End: 2020-11-04

## 2020-11-02 MED ORDER — BUPIVACAINE IN DEXTROSE 0.75-8.25 % IT SOLN
INTRATHECAL | Status: DC | PRN
  Administered 2020-11-02: 2 mL via INTRATHECAL

## 2020-11-02 MED ORDER — STERILE WATER FOR IRRIGATION IR SOLN
Status: DC | PRN
  Administered 2020-11-02: 2000 mL

## 2020-11-02 MED ORDER — NITROGLYCERIN 0.4 MG SL SUBL: 0.4000 mg | SUBLINGUAL_TABLET | SUBLINGUAL | Status: DC | PRN

## 2020-11-02 MED ORDER — HYDROCODONE-ACETAMINOPHEN 5-325 MG PO TABS
1.0000 | ORAL_TABLET | ORAL | Status: DC | PRN
  Administered 2020-11-04: 2 via ORAL
  Filled 2020-11-02: qty 1
  Filled 2020-11-02: qty 2

## 2020-11-02 MED ORDER — ISOPROPYL ALCOHOL 70 % SOLN
Status: DC | PRN
  Administered 2020-11-02: 1 via TOPICAL

## 2020-11-02 MED ORDER — DOCUSATE SODIUM 100 MG PO CAPS: 100.0000 mg | ORAL_CAPSULE | Freq: Two times a day (BID) | ORAL | 1 refills | Status: AC

## 2020-11-02 MED ORDER — DIPHENHYDRAMINE HCL 12.5 MG/5ML PO ELIX: 12.5000 mg | ORAL_SOLUTION | ORAL | Status: DC | PRN

## 2020-11-02 MED ORDER — SODIUM CHLORIDE 0.9% IV SOLUTION
INTRAVENOUS | Status: AC | PRN
  Administered 2020-11-02: 3000 mL

## 2020-11-02 MED ORDER — CEFAZOLIN SODIUM-DEXTROSE 2-4 GM/100ML-% IV SOLN
2.0000 g | Freq: Four times a day (QID) | INTRAVENOUS | Status: AC
  Administered 2020-11-02 – 2020-11-03 (×2): 2 g via INTRAVENOUS
  Filled 2020-11-02 (×2): qty 100

## 2020-11-02 MED ORDER — LACTATED RINGERS IV BOLUS: 500.0000 mL | Freq: Once | INTRAVENOUS | Status: DC

## 2020-11-02 MED ORDER — 0.9 % SODIUM CHLORIDE (POUR BTL) OPTIME
TOPICAL | Status: DC | PRN
  Administered 2020-11-02: 1000 mL

## 2020-11-02 MED ORDER — SENNA 8.6 MG PO TABS: 2.0000 | ORAL_TABLET | Freq: Every day | ORAL | 1 refills | Status: AC

## 2020-11-02 MED ORDER — MIDAZOLAM HCL 2 MG/2ML IJ SOLN
1.0000 mg | Freq: Once | INTRAMUSCULAR | Status: AC
  Administered 2020-11-02: 1 mg via INTRAVENOUS
  Filled 2020-11-02: qty 2

## 2020-11-02 MED ORDER — ONDANSETRON HCL 4 MG/2ML IJ SOLN
INTRAMUSCULAR | Status: DC | PRN
  Administered 2020-11-02: 4 mg via INTRAVENOUS

## 2020-11-02 MED ORDER — ORAL CARE MOUTH RINSE: 15.0000 mL | Freq: Once | OROMUCOSAL | Status: AC

## 2020-11-02 MED ORDER — DEXAMETHASONE SODIUM PHOSPHATE 4 MG/ML IJ SOLN
INTRAMUSCULAR | Status: DC | PRN
  Administered 2020-11-02: 4 mg via PERINEURAL

## 2020-11-02 MED ORDER — LIDOCAINE 2% (20 MG/ML) 5 ML SYRINGE
INTRAMUSCULAR | Status: DC | PRN
  Administered 2020-11-02: 50 mg via INTRAVENOUS

## 2020-11-02 MED ORDER — ASPIRIN 81 MG PO CHEW: 81.0000 mg | CHEWABLE_TABLET | Freq: Two times a day (BID) | ORAL | 0 refills | Status: AC

## 2020-11-02 MED ORDER — KETOROLAC TROMETHAMINE 30 MG/ML IJ SOLN
INTRAMUSCULAR | Status: AC
  Filled 2020-11-02: qty 1

## 2020-11-02 MED ORDER — DEXAMETHASONE SODIUM PHOSPHATE 10 MG/ML IJ SOLN
INTRAMUSCULAR | Status: DC | PRN
  Administered 2020-11-02: 4 mg via INTRAVENOUS

## 2020-11-02 MED ORDER — PHENYLEPHRINE HCL-NACL 10-0.9 MG/250ML-% IV SOLN
INTRAVENOUS | Status: DC | PRN
  Administered 2020-11-02: 50 ug/min via INTRAVENOUS

## 2020-11-02 MED ORDER — HYDROCODONE-ACETAMINOPHEN 7.5-325 MG PO TABS
1.0000 | ORAL_TABLET | ORAL | Status: DC | PRN
  Administered 2020-11-03: 1 via ORAL
  Filled 2020-11-02: qty 1

## 2020-11-02 MED ORDER — BUPIVACAINE-EPINEPHRINE 0.25% -1:200000 IJ SOLN
INTRAMUSCULAR | Status: DC | PRN
  Administered 2020-11-02: 30 mL

## 2020-11-02 MED ORDER — BUPIVACAINE-EPINEPHRINE (PF) 0.25% -1:200000 IJ SOLN
INTRAMUSCULAR | Status: AC
  Filled 2020-11-02: qty 30

## 2020-11-02 MED ORDER — FENTANYL CITRATE (PF) 100 MCG/2ML IJ SOLN
50.0000 ug | Freq: Once | INTRAMUSCULAR | Status: AC
  Administered 2020-11-02: 50 ug via INTRAVENOUS
  Filled 2020-11-02: qty 2

## 2020-11-02 SURGICAL SUPPLY — 75 items
ADH SKN CLS APL DERMABOND .7 (GAUZE/BANDAGES/DRESSINGS) ×2
BAG SPEC THK2 15X12 ZIP CLS (MISCELLANEOUS)
BAG ZIPLOCK 12X15 (MISCELLANEOUS) IMPLANT
BATTERY INSTRU NAVIGATION (MISCELLANEOUS) ×6 IMPLANT
BLADE SAW RECIPROCATING 77.5 (BLADE) ×2 IMPLANT
BNDG ELASTIC 4X5.8 VLCR STR LF (GAUZE/BANDAGES/DRESSINGS) ×2 IMPLANT
BNDG ELASTIC 6X5.8 VLCR STR LF (GAUZE/BANDAGES/DRESSINGS) ×2 IMPLANT
CHLORAPREP W/TINT 26 (MISCELLANEOUS) ×4 IMPLANT
COMPONENT TRI CR RETAIN SZ6 RT (Miscellaneous) ×1 IMPLANT
COVER SURGICAL LIGHT HANDLE (MISCELLANEOUS) ×2 IMPLANT
COVER WAND RF STERILE (DRAPES) ×2 IMPLANT
CUFF TOURN SGL QUICK 34 (TOURNIQUET CUFF) ×2
CUFF TRNQT CYL 34X4.125X (TOURNIQUET CUFF) ×1 IMPLANT
DECANTER SPIKE VIAL GLASS SM (MISCELLANEOUS) ×2 IMPLANT
DERMABOND ADVANCED (GAUZE/BANDAGES/DRESSINGS) ×2
DERMABOND ADVANCED .7 DNX12 (GAUZE/BANDAGES/DRESSINGS) ×2 IMPLANT
DRAPE SHEET LG 3/4 BI-LAMINATE (DRAPES) ×6 IMPLANT
DRAPE U-SHAPE 47X51 STRL (DRAPES) ×2 IMPLANT
DRESSING AQUACEL AG SP 3.5X10 (GAUZE/BANDAGES/DRESSINGS) ×1 IMPLANT
DRSG AQUACEL AG ADV 3.5X10 (GAUZE/BANDAGES/DRESSINGS) ×2 IMPLANT
DRSG AQUACEL AG SP 3.5X10 (GAUZE/BANDAGES/DRESSINGS) ×2
DRSG TEGADERM 4X4.75 (GAUZE/BANDAGES/DRESSINGS) IMPLANT
ELECT BLADE TIP CTD 4 INCH (ELECTRODE) ×2 IMPLANT
ELECT REM PT RETURN 15FT ADLT (MISCELLANEOUS) ×2 IMPLANT
EVACUATOR 1/8 PVC DRAIN (DRAIN) IMPLANT
GAUZE SPONGE 4X4 12PLY STRL (GAUZE/BANDAGES/DRESSINGS) ×2 IMPLANT
GLOVE BIO SURGEON STRL SZ8.5 (GLOVE) ×4 IMPLANT
GLOVE SRG 8 PF TXTR STRL LF DI (GLOVE) ×2 IMPLANT
GLOVE SURG ENC TEXT LTX SZ7.5 (GLOVE) ×6 IMPLANT
GLOVE SURG UNDER POLY LF SZ8 (GLOVE) ×4
GLOVE SURG UNDER POLY LF SZ8.5 (GLOVE) ×4 IMPLANT
GOWN SPEC L3 XXLG W/TWL (GOWN DISPOSABLE) ×2 IMPLANT
GOWN SPEC L4 XLG W/TWL (GOWN DISPOSABLE) ×2 IMPLANT
HANDPIECE INTERPULSE COAX TIP (DISPOSABLE) ×2
HOLDER FOLEY CATH W/STRAP (MISCELLANEOUS) ×2 IMPLANT
HOOD PEEL AWAY FLYTE STAYCOOL (MISCELLANEOUS) ×6 IMPLANT
INSERT TIB 6 11XBRNG CRCTE RTN (Insert) ×1 IMPLANT
JET LAVAGE IRRISEPT WOUND (IRRIGATION / IRRIGATOR) ×2
KIT TURNOVER KIT A (KITS) ×2 IMPLANT
KNEE INSERT TIB BRG (Insert) ×2 IMPLANT
KNEE TIBIAL COMPONENT SZ6 (Knees) ×2 IMPLANT
LAVAGE JET IRRISEPT WOUND (IRRIGATION / IRRIGATOR) ×1 IMPLANT
MARKER SKIN DUAL TIP RULER LAB (MISCELLANEOUS) ×2 IMPLANT
NDL SAFETY ECLIPSE 18X1.5 (NEEDLE) ×1 IMPLANT
NEEDLE HYPO 18GX1.5 SHARP (NEEDLE) ×2
NEEDLE SPNL 18GX3.5 QUINCKE PK (NEEDLE) ×2 IMPLANT
NS IRRIG 1000ML POUR BTL (IV SOLUTION) ×2 IMPLANT
PACK TOTAL KNEE CUSTOM (KITS) ×2 IMPLANT
PADDING CAST COTTON 6X4 STRL (CAST SUPPLIES) ×4 IMPLANT
PATELLA ASYMMETRIC 38X11 KNEE (Orthopedic Implant) ×2 IMPLANT
PENCIL SMOKE EVACUATOR (MISCELLANEOUS) ×2 IMPLANT
PIN FLUTED HEDLESS FIX 3.5X1/8 (PIN) ×2 IMPLANT
PROTECTOR NERVE ULNAR (MISCELLANEOUS) ×2 IMPLANT
SAW OSC TIP CART 19.5X105X1.3 (SAW) ×2 IMPLANT
SEALER BIPOLAR AQUA 6.0 (INSTRUMENTS) ×2 IMPLANT
SET HNDPC FAN SPRY TIP SCT (DISPOSABLE) ×1 IMPLANT
SET PAD KNEE POSITIONER (MISCELLANEOUS) ×2 IMPLANT
SPONGE DRAIN TRACH 4X4 STRL 2S (GAUZE/BANDAGES/DRESSINGS) IMPLANT
SUT MNCRL AB 3-0 PS2 18 (SUTURE) ×2 IMPLANT
SUT MNCRL AB 4-0 PS2 18 (SUTURE) ×2 IMPLANT
SUT MON AB 2-0 CT1 36 (SUTURE) ×2 IMPLANT
SUT STRATAFIX PDO 1 14 VIOLET (SUTURE) ×2
SUT STRATFX PDO 1 14 VIOLET (SUTURE) ×1
SUT VIC AB 1 CTX 36 (SUTURE) ×4
SUT VIC AB 1 CTX36XBRD ANBCTR (SUTURE) ×2 IMPLANT
SUT VIC AB 2-0 CT1 27 (SUTURE) ×2
SUT VIC AB 2-0 CT1 TAPERPNT 27 (SUTURE) ×1 IMPLANT
SUTURE STRATFX PDO 1 14 VIOLET (SUTURE) ×1 IMPLANT
SYR 3ML LL SCALE MARK (SYRINGE) ×2 IMPLANT
TOWER CARTRIDGE SMART MIX (DISPOSABLE) IMPLANT
TRAY FOLEY MTR SLVR 16FR STAT (SET/KITS/TRAYS/PACK) ×2 IMPLANT
TRIATHLON CRUCIATE RETAIN SZ6 (Miscellaneous) ×2 IMPLANT
TUBE SUCTION HIGH CAP CLEAR NV (SUCTIONS) ×2 IMPLANT
WATER STERILE IRR 1000ML POUR (IV SOLUTION) ×4 IMPLANT
WRAP KNEE MAXI GEL POST OP (GAUZE/BANDAGES/DRESSINGS) ×2 IMPLANT

## 2020-11-02 NOTE — Op Note (Signed)
OPERATIVE REPORT  SURGEON: Rod Can, MD   ASSISTANT: Nehemiah Massed, PA-C  PREOPERATIVE DIAGNOSIS: Right knee arthritis.   POSTOPERATIVE DIAGNOSIS: Right knee arthritis.   PROCEDURE: Right total knee arthroplasty.   IMPLANTS: Stryker Triathlon CR femur, size 6. Stryker Tritanium tibia, size 6. X3 polyethelyene insert, size 11 mm, CR. 3 button asymmetric patella, size 38 mm.  ANESTHESIA:  MAC, Regional and Spinal  TOURNIQUET TIME: Not utilized.   ESTIMATED BLOOD LOSS:-200 mL    ANTIBIOTICS: 2g Ancef.  DRAINS: None.  COMPLICATIONS: None   CONDITION: PACU - hemodynamically stable.   BRIEF CLINICAL NOTE: Stephen Graves is a 65 y.o. male with a long-standing history of Right knee arthritis. After failing conservative management, the patient was indicated for total knee arthroplasty. The risks, benefits, and alternatives to the procedure were explained, and the patient elected to proceed.  PROCEDURE IN DETAIL: Adductor canal block was obtained in the pre-op holding area. Once inside the operative room, spinal anesthesia was obtained, and a foley catheter was inserted. The patient was then positioned, a nonsterile tourniquet was placed, and the lower extremity was prepped and draped in the normal sterile surgical fashion.  A time-out was called verifying side and site of surgery. The patient received IV antibiotics within 60 minutes of beginning the procedure. The tourniquet was not utilized.   An anterior approach to the knee was performed utilizing a midvastus arthrotomy. A medial release was performed and the patellar fat pad was excised. Stryker navigation was used to cut the distal femur perpendicular to the mechanical axis. A freehand patellar resection was performed, and the patella was sized an prepared with 3 lug holes.  Nagivation was used to make a neutral proximal tibia resection, taking 9 mm of bone  from the less affected lateral side with 3 degrees of slope. The menisci were excised. A spacer block was placed, and the alignment and balance in extension were confirmed.   The distal femur was sized using the 3-degree external rotation guide referencing the posterior femoral cortex. The appropriate 4-in-1 cutting block was pinned into place. Rotation was checked using Whiteside's line, the epicondylar axis, and then confirmed with a spacer block in flexion. The remaining femoral cuts were performed, taking care to protect the MCL.  The tibia was sized and the trial tray was pinned into place. The remaining trail components were inserted. The knee was stable to varus and valgus stress through a full range of motion. The patella tracked centrally, and the PCL was well balanced. The trial components were removed, and the proximal tibial surface was prepared. Final components were impacted into place. The knee was tested for a final time and found to be well balanced.   The wound was copiously irrigated with Irrisept solution and normal saline using pule lavage.  Marcaine solution was injected into the periarticular soft tissue.  The wound was closed in layers using #1 Vicryl and Stratafix for the fascia, 2-0 Vicryl for the subcutaneous fat, 2-0 Monocryl for the deep dermal layer, 3-0 running Monocryl subcuticular Stitch, and 4-0 Monocryl stay sutures at both ends of the wound. Dermabond was applied to the skin.  Once the glue was fully dried, an Aquacell Ag and compressive dressing were applied.  Tthe patient was transported to the recovery room in stable condition.  Sponge, needle, and instrument counts were correct at the end of the case x2.  The patient tolerated the procedure well and there were no known complications.  Please note that a  surgical assistant was a medical necessity for this procedure in order to perform it in a safe and expeditious manner. Surgical assistant was necessary to retract the  ligaments and vital neurovascular structures to prevent injury to them and also necessary for proper positioning of the limb to allow for anatomic placement of the prosthesis.

## 2020-11-02 NOTE — Progress Notes (Signed)
Orthopedic Tech Progress Note Patient Details:  Stephen Graves 01-03-1956 [id number]  Ortho Devices Type of Ortho Device: Knee Immobilizer Ortho Device/Splint Location: Patient Right Ortho Device/Splint Interventions: Ordered,Application   Post Interventions Patient Tolerated: Well Instructions Provided: Adjustment of device,Care of device   Rosana Hoes 11/02/2020, 6:19 PM

## 2020-11-02 NOTE — OR Nursing (Signed)
Patient is waking up and is doing well, no complaints of pain, family member (sister) is upset patient has to spend the night, me and the doctor spoke with her, patient is to floor with sister notified.

## 2020-11-02 NOTE — Anesthesia Preprocedure Evaluation (Signed)
Anesthesia Evaluation  Patient identified by MRN, date of birth, ID band Patient awake    Reviewed: Allergy & Precautions, NPO status , Patient's Chart, lab work & pertinent test results  Airway Mallampati: III  TM Distance: >3 FB Neck ROM: Full    Dental  (+) Edentulous Upper, Missing, Dental Advisory Given, Partial Lower   Pulmonary former smoker,    Pulmonary exam normal breath sounds clear to auscultation       Cardiovascular hypertension, Pt. on home beta blockers and Pt. on medications + angina + CAD and + Cardiac Stents  Normal cardiovascular exam Rhythm:Regular Rate:Normal  Echo 06/2017 - Left ventricle: The cavity size was normal. Wall thickness was increased in a pattern of mild LVH. Systolic function was normal. The estimated ejection fraction was in the range of 60% to 65%. Wall motion was normal; there were no regional wall motion abnormalities. Diastolic dysfunction, grade indeterminate.  - Aortic valve: Moderately calcified annulus. Trileaflet.    Cath 10/20218   The left ventricular systolic function is normal.  LV end diastolic pressure is normal.  The left ventricular ejection fraction is 55-65% by visual estimate.  Ost LAD lesion, 30 %stenosed.  Ost Ramus to Ramus lesion, 40 %stenosed.  Prox LAD to Mid LAD lesion, 50 %stenosed.  A drug eluting stent was successfully placed.  Mid LAD to Dist LAD lesion, 99 %stenosed.  Post intervention, there is a 0% residual stenosis.   1.  Severe one-vessel coronary artery disease with 99% stenosis in the mid to distal LAD. 2.  Normal LV systolic function and normal left ventricular end-diastolic pressure. 3.  Successful angioplasty and drug-eluting stent placement to the mid/distal LAD.  A small septal branch was jailed by the stent with sluggish flow.  This resulted in chest pain and was treated with nitroglycerin drip.    Neuro/Psych PSYCHIATRIC DISORDERS  Anxiety negative neurological ROS     GI/Hepatic Neg liver ROS, hiatal hernia,   Endo/Other  negative endocrine ROS  Renal/GU negative Renal ROS     Musculoskeletal  (+) Arthritis ,   Abdominal (+) + obese,   Peds  Hematology negative hematology ROS (+)   Anesthesia Other Findings   Reproductive/Obstetrics negative OB ROS                            Anesthesia Physical  Anesthesia Plan  ASA: III  Anesthesia Plan: Spinal   Post-op Pain Management:  Regional for Post-op pain   Induction: Intravenous  PONV Risk Score and Plan: 1 and Ondansetron, Propofol infusion, TIVA, Treatment may vary due to age or medical condition, Midazolam and Dexamethasone  Airway Management Planned: Simple Face Mask  Additional Equipment: None  Intra-op Plan:   Post-operative Plan:   Informed Consent: I have reviewed the patients History and Physical, chart, labs and discussed the procedure including the risks, benefits and alternatives for the proposed anesthesia with the patient or authorized representative who has indicated his/her understanding and acceptance.     Dental advisory given  Plan Discussed with: CRNA  Anesthesia Plan Comments:        Anesthesia Quick Evaluation

## 2020-11-02 NOTE — Anesthesia Postprocedure Evaluation (Signed)
Anesthesia Post Note  Patient: Stephen Graves  Procedure(s) Performed: COMPUTER ASSISTED TOTAL KNEE ARTHROPLASTY (Right Knee)     Patient location during evaluation: PACU Anesthesia Type: Spinal Level of consciousness: awake and alert Pain management: pain level controlled Vital Signs Assessment: post-procedure vital signs reviewed and stable Respiratory status: spontaneous breathing Cardiovascular status: stable Anesthetic complications: no   No complications documented.  Last Vitals:  Vitals:   11/02/20 1626 11/02/20 1820  BP: 131/87 92/61  Pulse: (!) 56 68  Resp:  18  Temp: 36.4 C (!) 36.3 C  SpO2: 100% 100%    Last Pain:  Vitals:   11/02/20 1820  TempSrc: Oral  PainSc:                  Nolon Nations

## 2020-11-02 NOTE — Transfer of Care (Signed)
Immediate Anesthesia Transfer of Care Note  Patient: Stephen Graves  Procedure(s) Performed: COMPUTER ASSISTED TOTAL KNEE ARTHROPLASTY (Right Knee)  Patient Location: PACU  Anesthesia Type:Regional and Spinal  Level of Consciousness: awake, alert  and oriented  Airway & Oxygen Therapy: Patient Spontanous Breathing and Patient connected to face mask oxygen  Post-op Assessment: Report given to RN and Post -op Vital signs reviewed and stable  Post vital signs: Reviewed and stable  Last Vitals:  Vitals Value Taken Time  BP    Temp    Pulse    Resp    SpO2      Last Pain:  Vitals:   11/02/20 1051  TempSrc:   PainSc: 0-No pain         Complications: No complications documented.

## 2020-11-02 NOTE — Progress Notes (Signed)
Assisted Dr. Germeroth with right, ultrasound guided, adductor canal block. Side rails up, monitors on throughout procedure. See vital signs in flow sheet. Tolerated Procedure well. 

## 2020-11-02 NOTE — Anesthesia Procedure Notes (Signed)
Spinal  Patient location during procedure: OR Start time: 11/02/2020 11:44 AM End time: 11/02/2020 11:46 AM Staffing Performed: anesthesiologist  Anesthesiologist: Nolon Nations, MD Preanesthetic Checklist Completed: patient identified, IV checked, site marked, risks and benefits discussed, surgical consent, monitors and equipment checked, pre-op evaluation and timeout performed Spinal Block Patient position: sitting Prep: DuraPrep and site prepped and draped Patient monitoring: heart rate, continuous pulse ox and blood pressure Approach: right paramedian Location: L3-4 Injection technique: single-shot Needle Needle type: Sprotte  Needle gauge: 24 G Needle length: 9 cm Additional Notes Expiration date of kit checked and confirmed. Patient tolerated procedure well, without complications.

## 2020-11-02 NOTE — Care Plan (Signed)
Ortho Bundle Case Management Note  Patient Details  Name: Stephen Graves MRN: [id number] Date of Birth: 07-Jan-1956  R TKA on 11-02-20 DCP:  Home with friend.  1 story home with 4 ste. DME:  No needs.  Has a RW and 3-in-1. PT:  [facility].  PT eval scheduled on 11-04-20.                   DME Arranged:  N/A DME Agency:  NA  HH Arranged:  NA HH Agency:  NA  Additional Comments: Please contact me with any questions of if this plan should need to change.  Marianne Sofia, RN,CCM [facility]  [phone] 11/02/2020, 8:30 AM

## 2020-11-02 NOTE — Evaluation (Signed)
Physical Therapy Evaluation Patient Details Name: Stephen Graves MRN: [id number] DOB: 29-Aug-1956 Today's Date: 11/02/2020   History of Present Illness  Patient is 65 y.o. male s/p Rt TKA on 11/02/20 with PMH significant for HTN, CAD s/p cath and stent, anxiety.  Clinical Impression  CHRISEAN KLOTH is a 65 y.o. male POD 0 s/p Rt TKA. Patient reports independence with mobility at baseline. Patient is now limited by functional impairments (see PT problem list below). Patient instructed in exercise to facilitate ROM and circulation to prevent DVT's. Patient limited by poor quad activation and no knee immobilizer to progress mobility. Pt repositioned in bed to eat dinner. Patient will benefit from continued skilled PT interventions to address impairments and progress towards PLOF. Acute PT will follow to progress mobility and stair training in preparation for safe discharge home.     Follow Up Recommendations Follow surgeons recommendation for DC plan and follow-up therapies    Equipment Recommendations  None recommended by PT    Recommendations for Other Services       Precautions / Restrictions Precautions Precautions: Fall Restrictions Weight Bearing Restrictions: No Other Position/Activity Restrictions: WBAT      Mobility  Bed Mobility               General bed mobility comments: pt limited by significant extensor lag with Rt LE. called for knee immobilizer but not delivered. mobility deferred without brace.    Transfers                    Ambulation/Gait                Stairs            Wheelchair Mobility    Modified Rankin (Stroke Patients Only)       Balance                                             Pertinent Vitals/Pain Pain Assessment: 0-10 Pain Score: 2  Pain Location: Rt knee Pain Descriptors / Indicators: Aching Pain Intervention(s): Limited activity within patient's tolerance;Monitored during  session;Repositioned    Home Living Family/patient expects to be discharged to:: Private residence Living Arrangements: Alone Available Help at Discharge: Family Type of Home: House Home Access: Stairs to enter Entrance Stairs-Rails: Can reach both (both at his sisters, Lt rail at his home) Technical brewer of Steps: 4 Home Layout: One level Home Equipment: Environmental consultant - 2 wheels;Bedside commode;Cane - single point (towel rack)      Prior Function Level of Independence: Independent               Hand Dominance   Dominant Hand: Right    Extremity/Trunk Assessment   Upper Extremity Assessment Upper Extremity Assessment: Overall WFL for tasks assessed    Lower Extremity Assessment Lower Extremity Assessment: RLE deficits/detail RLE Deficits / Details: pt with poor quad activation and significant extensor lag with SLR. 1/5 for quad strength. RLE:  (called for immobilizer but never delivered) RLE Sensation: WNL RLE Coordination: decreased gross motor    Cervical / Trunk Assessment Cervical / Trunk Assessment: Normal  Communication   Communication: No difficulties  Cognition Arousal/Alertness: Awake/alert Behavior During Therapy: WFL for tasks assessed/performed Overall Cognitive Status: Within Functional Limits for tasks assessed  General Comments      Exercises Total Joint Exercises Ankle Circles/Pumps: AROM;Both;20 reps;Supine Quad Sets: AROM;Right;10 reps;Supine Heel Slides: AROM;Right;10 reps;Supine   Assessment/Plan    PT Assessment Patient needs continued PT services  PT Problem List Decreased strength;Decreased range of motion;Decreased activity tolerance;Decreased balance;Decreased mobility;Decreased knowledge of use of DME       PT Treatment Interventions DME instruction;Gait training;Stair training;Functional mobility training;Therapeutic exercise;Balance training;Therapeutic  activities;Patient/family education    PT Goals (Current goals can be found in the Care Plan section)  Acute Rehab PT Goals Patient Stated Goal: be able to go to concert/show at end of February PT Goal Formulation: With patient Time For Goal Achievement: 11/09/20 Potential to Achieve Goals: Good    Frequency 7X/week   Barriers to discharge        Co-evaluation               AM-PAC PT "6 Clicks" Mobility  Outcome Measure Help needed turning from your back to your side while in a flat bed without using bedrails?: A Little Help needed moving from lying on your back to sitting on the side of a flat bed without using bedrails?: A Little Help needed moving to and from a bed to a chair (including a wheelchair)?: A Little Help needed standing up from a chair using your arms (e.g., wheelchair or bedside chair)?: A Lot Help needed to walk in hospital room?: A Lot Help needed climbing 3-5 steps with a railing? : Total 6 Click Score: 14    End of Session Equipment Utilized During Treatment: Gait belt;Right knee immobilizer Activity Tolerance: Patient tolerated treatment well Patient left: in bed;with call bell/phone within reach;with bed alarm set Nurse Communication: Mobility status PT Visit Diagnosis: Other abnormalities of gait and mobility (R26.89);Muscle weakness (generalized) (M62.81);Difficulty in walking, not elsewhere classified (R26.2)    Time: 1610-9604 PT Time Calculation (min) (ACUTE ONLY): 20 min   Charges:   PT Evaluation $PT Eval Low Complexity: 1 Low          Verner Mould, DPT Acute Rehabilitation Services Office [phone] Pager [phone]    Jacques Navy 11/02/2020, 6:15 PM

## 2020-11-02 NOTE — Anesthesia Procedure Notes (Signed)
Anesthesia Regional Block: Adductor canal block   Pre-Anesthetic Checklist: ,, timeout performed, Correct Patient, Correct Site, Correct Laterality, Correct Procedure, Correct Position, site marked, Risks and benefits discussed,  Surgical consent,  Pre-op evaluation,  At surgeon's request and post-op pain management  Laterality: Right  Prep: chloraprep       Needles:  Injection technique: Single-shot  Needle Type: Stimiplex     Needle Length: 9cm  Needle Gauge: 21     Additional Needles:   Procedures:,,,, ultrasound used (permanent image in chart),,,,  Narrative:  Start time: 11/02/2020 10:39 AM End time: 11/02/2020 10:44 AM Injection made incrementally with aspirations every 5 mL.  Performed by: Personally  Anesthesiologist: Nolon Nations, MD  Additional Notes: BP cuff, EKG monitors applied. Sedation begun. Artery and nerve location verified with U/S and anesthetic injected incrementally, slowly, and after negative aspirations under direct u/s guidance. Good fascial /perineural spread. Tolerated well.

## 2020-11-02 NOTE — Interval H&P Note (Signed)
History and Physical Interval Note:  11/02/2020 8:18 AM  Stephen Graves  has presented today for surgery, with the diagnosis of right knee degenerative joint disease.  The various methods of treatment have been discussed with the patient and family. After consideration of risks, benefits and other options for treatment, the patient has consented to  Procedure(s) with comments: Hubbell (Right) - 174min as a surgical intervention.  The patient's history has been reviewed, patient examined, no change in status, stable for surgery.  I have reviewed the patient's chart and labs.  Questions were answered to the patient's satisfaction.     Hilton Cork Sheina Mcleish

## 2020-11-02 NOTE — Discharge Instructions (Signed)
° °Dr. Lourene Hoston °Total Joint Specialist °[facility] °[address] °[place], [place] [address] °[phone] ° °TOTAL KNEE REPLACEMENT POSTOPERATIVE DIRECTIONS ° ° ° °Knee Rehabilitation, Guidelines Following Surgery  °Results after knee surgery are often greatly improved when you follow the exercise, range of motion and muscle strengthening exercises prescribed by your doctor. Safety measures are also important to protect the knee from further injury. Any time any of these exercises cause you to have increased pain or swelling in your knee joint, decrease the amount until you are comfortable again and slowly increase them. If you have problems or questions, call your caregiver or physical therapist for advice.  ° °WEIGHT BEARING °Weight bearing as tolerated with assist device (walker, cane, etc) as directed, use it as long as suggested by your surgeon or therapist, typically at least 4-6 weeks. ° °HOME CARE INSTRUCTIONS  °Remove items at home which could result in a fall. This includes throw rugs or furniture in walking pathways.  °Continue medications as instructed at time of discharge. °You may have some home medications which will be placed on hold until you complete the course of blood thinner medication.  °You may start showering once you are discharged home but do not submerge the incision under water. Just pat the incision dry and apply a dry gauze dressing on daily. °Walk with walker as instructed.  °You may resume a sexual relationship in one month or when given the OK by your doctor.  °· Use walker as long as suggested by your caregivers. °· Avoid periods of inactivity such as sitting longer than an hour when not asleep. This helps prevent blood clots.  °You may put full weight on your legs and walk as much as is comfortable.  °You may return to work once you are cleared by your doctor.  °Do not drive a car for 6 weeks or until released by you surgeon.  °· Do not drive  while taking narcotics.  °Wear the elastic stockings for three weeks following surgery during the day but you may remove then at night. °Make sure you keep all of your appointments after your operation with all of your doctors and caregivers. You should call the office at the above phone number and make an appointment for approximately two weeks after the date of your surgery. °Do not remove your surgical dressing. The dressing is waterproof; you may take showers in 3 days, but do not take tub baths or submerge the dressing. °Please pick up a stool softener and laxative for home use as long as you are requiring pain medications. °· ICE to the affected knee every three hours for 30 minutes at a time and then as needed for pain and swelling.  Continue to use ice on the knee for pain and swelling from surgery. You may notice swelling that will progress down to the foot and ankle.  This is normal after surgery.  Elevate the leg when you are not up walking on it.   °It is important for you to complete the blood thinner medication as prescribed by your doctor. °· Continue to use the breathing machine which will help keep your temperature down.  It is common for your temperature to cycle up and down following surgery, especially at night when you are not up moving around and exerting yourself.  The breathing machine keeps your lungs expanded and your temperature down. ° °RANGE OF MOTION AND STRENGTHENING EXERCISES  °Rehabilitation of the knee is important following   a knee injury or an operation. After just a few days of immobilization, the muscles of the thigh which control the knee become weakened and shrink (atrophy). Knee exercises are designed to build up the tone and strength of the thigh muscles and to improve knee motion. Often times heat used for twenty to thirty minutes before working out will loosen up your tissues and help with improving the range of motion but do not use heat for the first two weeks following  surgery. These exercises can be done on a training (exercise) mat, on the floor, on a table or on a bed. Use what ever works the best and is most comfortable for you Knee exercises include:  °Leg Lifts - While your knee is still immobilized in a splint or cast, you can do straight leg raises. Lift the leg to 60 degrees, hold for 3 sec, and slowly lower the leg. Repeat 10-20 times 2-3 times daily. Perform this exercise against resistance later as your knee gets better.  °Quad and Hamstring Sets - Tighten up the muscle on the front of the thigh (Quad) and hold for 5-10 sec. Repeat this 10-20 times hourly. Hamstring sets are done by pushing the foot backward against an object and holding for 5-10 sec. Repeat as with quad sets.  °A rehabilitation program following serious knee injuries can speed recovery and prevent re-injury in the future due to weakened muscles. Contact your doctor or a physical therapist for more information on knee rehabilitation.  ° °SKILLED REHAB INSTRUCTIONS: °If the patient is transferred to a skilled rehab facility following release from the hospital, a list of the current medications will be sent to the facility for the patient to continue.  When discharged from the skilled rehab facility, please have the facility set up the patient's Home Health Physical Therapy prior to being released. Also, the skilled facility will be responsible for providing the patient with their medications at time of release from the facility to include their pain medication, the muscle relaxants, and their blood thinner medication. If the patient is still at the rehab facility at time of the two week follow up appointment, the skilled rehab facility will also need to assist the patient in arranging follow up appointment in our office and any transportation needs. ° °MAKE SURE YOU:  °Understand these instructions.  °Will watch your condition.  °Will get help right away if you are not doing well or get worse.   ° ° °Pick up stool softner and laxative for home use following surgery while on pain medications. °Do NOT remove your dressing. You may shower.  °Do not take tub baths or submerge incision under water. °May shower starting three days after surgery. °Please use a clean towel to pat the incision dry following showers. °Continue to use ice for pain and swelling after surgery. °Do not use any lotions or creams on the incision until instructed by your surgeon. ° °

## 2020-11-03 ENCOUNTER — Other Ambulatory Visit: Payer: Self-pay

## 2020-11-03 ENCOUNTER — Encounter (HOSPITAL_COMMUNITY): Payer: Self-pay | Admitting: Orthopedic Surgery

## 2020-11-03 DIAGNOSIS — Z7982 Long term (current) use of aspirin: Secondary | ICD-10-CM | POA: Diagnosis not present

## 2020-11-03 DIAGNOSIS — I10 Essential (primary) hypertension: Secondary | ICD-10-CM | POA: Diagnosis not present

## 2020-11-03 DIAGNOSIS — Z87891 Personal history of nicotine dependence: Secondary | ICD-10-CM | POA: Diagnosis not present

## 2020-11-03 DIAGNOSIS — M1711 Unilateral primary osteoarthritis, right knee: Secondary | ICD-10-CM | POA: Diagnosis not present

## 2020-11-03 DIAGNOSIS — Z79899 Other long term (current) drug therapy: Secondary | ICD-10-CM | POA: Diagnosis not present

## 2020-11-03 DIAGNOSIS — I25110 Atherosclerotic heart disease of native coronary artery with unstable angina pectoris: Secondary | ICD-10-CM | POA: Diagnosis not present

## 2020-11-03 DIAGNOSIS — I249 Acute ischemic heart disease, unspecified: Secondary | ICD-10-CM | POA: Diagnosis not present

## 2020-11-03 LAB — BASIC METABOLIC PANEL
Anion gap: 10 (ref 5–15)
Anion gap: 11 (ref 5–15)
BUN: 33 mg/dL — ABNORMAL HIGH (ref 8–23)
BUN: 31 mg/dL — ABNORMAL HIGH (ref 8–23)
CO2: 23 mmol/L (ref 22–32)
CO2: 22 mmol/L (ref 22–32)
Calcium: 8.5 mg/dL — ABNORMAL LOW (ref 8.9–10.3)
Calcium: 8.5 mg/dL — ABNORMAL LOW (ref 8.9–10.3)
Chloride: 99 mmol/L (ref 98–111)
Chloride: 101 mmol/L (ref 98–111)
Creatinine, Ser: 1.54 mg/dL — ABNORMAL HIGH (ref 0.61–1.24)
Creatinine, Ser: 1.46 mg/dL — ABNORMAL HIGH (ref 0.61–1.24)
GFR, Estimated: 49 mL/min — ABNORMAL LOW (ref >60)
GFR, Estimated: 53 mL/min — ABNORMAL LOW (ref >60)
Glucose, Bld: 167 mg/dL — ABNORMAL HIGH (ref 70–99)
Glucose, Bld: 170 mg/dL — ABNORMAL HIGH (ref 70–99)
Potassium: 4 mmol/L (ref 3.5–5.1)
Potassium: 4.2 mmol/L (ref 3.5–5.1)
Sodium: 132 mmol/L — ABNORMAL LOW (ref 135–145)
Sodium: 134 mmol/L — ABNORMAL LOW (ref 135–145)

## 2020-11-03 LAB — CBC
HCT: 34.6 % — ABNORMAL LOW (ref 39.0–52.0)
Hemoglobin: 11 g/dL — ABNORMAL LOW (ref 13.0–17.0)
MCH: 29.1 pg (ref 26.0–34.0)
MCHC: 31.8 g/dL (ref 30.0–36.0)
MCV: 91.5 fL (ref 80.0–100.0)
Platelets: 121 10*3/uL — ABNORMAL LOW (ref 150–400)
RBC: 3.78 MIL/uL — ABNORMAL LOW (ref 4.22–5.81)
RDW: 14.6 % (ref 11.5–15.5)
WBC: 10.5 10*3/uL (ref 4.0–10.5)
nRBC: 0 % (ref 0.0–0.2)

## 2020-11-03 NOTE — Progress Notes (Signed)
Physical Therapy Treatment Patient Details Name: Stephen Graves MRN: [id number] DOB: [birth year] Today's Date: 11/03/2020    History of Present Illness Patient is 65 y.o. male s/p Rt TKA on 11/02/20 with PMH significant for HTN, CAD s/p cath and stent, anxiety.    PT Comments    Patient making progress this session with R LE stability in stance.  Seems better without knee immobilizer as pt able to see the leg and activate more.  Still high fall risk and needs continued skilled PT in the acute setting.  Hopeful for continued improvement and potential to practice steps and go home tomorrow.    Follow Up Recommendations  Follow surgeon's recommendation for DC plan and follow-up therapies     Equipment Recommendations  None recommended by PT    Recommendations for Other Services       Precautions / Restrictions Precautions Precautions: Fall Required Braces or Orthoses: Knee Immobilizer - Right Restrictions Other Position/Activity Restrictions: WBAT    Mobility  Bed Mobility               General bed mobility comments: up in chair    Transfers Overall transfer level: Needs assistance Equipment used: Rolling walker (2 wheeled) Transfers: Sit to/from Stand Sit to Stand: Mod assist         General transfer comment: left KI off due to some improvement in R knee activation when able to see and feel the leg; still needing lifting assist even with R leg helping and mod cues for hand placement  Ambulation/Gait Ambulation/Gait assistance: Min assist;Mod assist;+2 safety/equipment Gait Distance (Feet): 90 Feet Assistive device: Rolling walker (2 wheeled) Gait Pattern/deviations: Step-to pattern;Decreased stride length;Decreased stance time - right     General Gait Details: assist and cues throughout for pushing R foot into the floor, then for keeping L step small   Stairs             Wheelchair Mobility    Modified Rankin (Stroke Patients Only)        Balance Overall balance assessment: Needs assistance   Sitting balance-Leahy Scale: Good     Standing balance support: Bilateral upper extremity supported Standing balance-Leahy Scale: Poor                              Cognition Arousal/Alertness: Awake/alert Behavior During Therapy: Anxious Overall Cognitive Status: Impaired/Different from baseline Area of Impairment: Attention                   Current Attention Level: Sustained   Following Commands: Follows one step commands consistently;Follows multi-step commands with increased time;Follows multi-step commands inconsistently     Problem Solving: Slow processing General Comments: cues throughout to focus on task and not ask about tomorrow or when he possibly would be ready to go home, etc      Exercises Total Joint Exercises Towel Squeeze: Strengthening;Both;10 reps;Seated Other Exercises Other Exercises: leg press into PT's hand on R x 5    General Comments        Pertinent Vitals/Pain Pain Assessment: Faces Faces Pain Scale: Hurts a little bit Pain Location: behind R calf Pain Descriptors / Indicators: Tender Pain Intervention(s): Monitored during session    Home Living                      Prior Function            PT Goals (current goals  can now be found in the care plan section) Progress towards PT goals: Progressing toward goals    Frequency    7X/week      PT Plan Current plan remains appropriate    Co-evaluation              AM-PAC PT "6 Clicks" Mobility   Outcome Measure  Help needed turning from your back to your side while in a flat bed without using bedrails?: None Help needed moving from lying on your back to sitting on the side of a flat bed without using bedrails?: None Help needed moving to and from a bed to a chair (including a wheelchair)?: A Little Help needed standing up from a chair using your arms (e.g., wheelchair or bedside chair)?: A  Lot Help needed to walk in hospital room?: A Lot Help needed climbing 3-5 steps with a railing? : Total 6 Click Score: 16    End of Session Equipment Utilized During Treatment: Gait belt Activity Tolerance: Patient tolerated treatment well Patient left: in chair;with call bell/phone within reach;with chair alarm set   PT Visit Diagnosis: Other abnormalities of gait and mobility (R26.89);Muscle weakness (generalized) (M62.81);Difficulty in walking, not elsewhere classified (R26.2)     Time: 2094-7096 PT Time Calculation (min) (ACUTE ONLY): 29 min  Charges:  $Gait Training: 8-22 mins $Therapeutic Activity: 8-22 mins                     Magda Kiel, PT Acute Rehabilitation Services [phone] Office:[phone] 11/03/2020    Reginia Naas 11/03/2020, 5:55 PM

## 2020-11-03 NOTE — Progress Notes (Signed)
Physical Therapy Treatment Patient Details Name: Stephen Graves MRN: [id number] DOB: 28-Oct-1955 Today's Date: 11/03/2020    History of Present Illness Patient is 64 y.o. male s/p Rt TKA on 11/02/20 with PMH significant for HTN, CAD s/p cath and stent, anxiety.    PT Comments    Patient able to elicit weak quad activation on the R.  Remains with ace wrap, padding and likely some edema, though denied numbness.  Patient with some delay in following commands at times and perseverated on his concern for his knee not working right.  Stated he stayed up all night worrying.  Encouraged throughout session that this happens at times and worked to encourage activation throughout exercises and ambulation.  Patient did fall as noted below, not due to the R knee buckling, but due to mixing up PT cues for stepping with L leg thinking he was to turn left.  Patient lowered to floor and no injuries noted.  Will return for second session today, but do not feel he is safe for d/c home today.  Pending improvement may need STSNF.  PT to follow up.    Follow Up Recommendations  Follow surgeon's recommendation for DC plan and follow-up therapies (patient reports set up for outpatient PT in [facility])     Equipment Recommendations  None recommended by PT    Recommendations for Other Services       Precautions / Restrictions Precautions Precautions: Fall;Knee Required Braces or Orthoses: Knee Immobilizer - Right Knee Immobilizer - Right: Other (comment) (KI in the room, no order) Restrictions Other Position/Activity Restrictions: WBAT    Mobility  Bed Mobility Overal bed mobility: Needs Assistance Bed Mobility: Supine to Sit     Supine to sit: HOB elevated;Supervision     General bed mobility comments: increased time and effort getting out of sunken area in middle of the bed    Transfers Overall transfer level: Needs assistance Equipment used: Rolling walker (2 wheeled) Transfers: Sit to/from  Stand Sit to Stand: Mod assist         General transfer comment: up from EOB at regular height initially trying to bend his R knee with immobilizer on to use it, educated needs to use L knee to help stand since brace will not let him bend his R knee; stood with lifting help to walker with cues for technique/safety  Ambulation/Gait Ambulation/Gait assistance: Min assist;Mod assist Gait Distance (Feet): 40 Feet Assistive device: Rolling walker (2 wheeled) Gait Pattern/deviations: Step-to pattern;Step-through pattern;Decreased stride length;Decreased weight shift to right     General Gait Details: Max cues throughout to strike heel on R first to engage knee extensors, for tall posture, for arm extension to support R knee, facilitation at times for knee and hip extensors in stance on the R.  Noted buckling in the R knee with stance, but at times able to use UE support with less buckling.  LOB x1 and unable to regain so lowered to floor.   Stairs             Wheelchair Mobility    Modified Rankin (Stroke Patients Only)       Balance Overall balance assessment: Needs assistance Sitting-balance support: Feet supported Sitting balance-Leahy Scale: Good     Standing balance support: Bilateral upper extremity supported Standing balance-Leahy Scale: Poor Standing balance comment: UE support needed for balance  Cognition Arousal/Alertness: Awake/alert Behavior During Therapy: Anxious Overall Cognitive Status: Impaired/Different from baseline Area of Impairment: Problem solving;Following commands                       Following Commands: Follows one step commands consistently;Follows multi-step commands with increased time;Follows multi-step commands inconsistently     Problem Solving: Slow processing;Requires verbal cues;Difficulty sequencing General Comments: reports had not slept since worried all night about his leg thinking  he might be paralyzed.  Slower to respond at times      Exercises Total Joint Exercises Ankle Circles/Pumps: AROM;Both;20 reps;Supine Quad Sets: AROM;Right;10 reps;Supine Towel Squeeze: Strengthening;Both;10 reps;Supine Short Arc Quad: AAROM;Right;10 reps;Supine Heel Slides: AROM;Right;10 reps;Supine Hip ABduction/ADduction: AROM;10 reps;Right;Supine Straight Leg Raises: AAROM;Right;5 reps;Supine    General Comments General comments (skin integrity, edema, etc.): Patient had been very anxious regarding the limitation in his R knee extension since surgery and nerve block.  Patient encouraged throughout session and able to elicit quad set, but no antigravity strength.  Utilized knee immobilizer and gait belt.  Patient was ambulating with RW and min A with mod cues for technique to engage R knee extensors.  He had gone about 59' and even turned with walker to head back to the room.  Patient got confused with PT cues to step with the L foot thinking I said turn and got off balance.  PT attempted to assist to rebalance, but was unable and assisted to lower pt to the floor.  Patient denied injury and was assisted to stand with NT, RN, student nurse and PT.  Recliner brought and pt assisted to sit and returned to room in recliner.  Left with legs elevated and call bell in reach.      Pertinent Vitals/Pain Pain Score: 0-No pain    Home Living                      Prior Function            PT Goals (current goals can now be found in the care plan section) Progress towards PT goals: Progressing toward goals    Frequency    7X/week      PT Plan Current plan remains appropriate    Co-evaluation              AM-PAC PT "6 Clicks" Mobility   Outcome Measure  Help needed turning from your back to your side while in a flat bed without using bedrails?: None Help needed moving from lying on your back to sitting on the side of a flat bed without using bedrails?: None Help  needed moving to and from a bed to a chair (including a wheelchair)?: A Little Help needed standing up from a chair using your arms (e.g., wheelchair or bedside chair)?: A Lot Help needed to walk in hospital room?: A Lot Help needed climbing 3-5 steps with a railing? : Total 6 Click Score: 16    End of Session Equipment Utilized During Treatment: Gait belt;Right knee immobilizer Activity Tolerance: Patient tolerated treatment well Patient left: in chair;with call bell/phone within reach;with chair alarm set   PT Visit Diagnosis: Other abnormalities of gait and mobility (R26.89);Muscle weakness (generalized) (M62.81);Difficulty in walking, not elsewhere classified (R26.2)     Time: 1749-4496 PT Time Calculation (min) (ACUTE ONLY): 39 min  Charges:  $Gait Training: 8-22 mins $Therapeutic Exercise: 8-22 mins $Therapeutic Activity: 8-22 mins  Magda Kiel, PT Acute Rehabilitation Services IHDTP:[phone] Office:[phone] 11/03/2020    Reginia Naas 11/03/2020, 11:05 AM

## 2020-11-03 NOTE — Progress Notes (Signed)
    Subjective:  Patient reports pain as mild to moderate.  Denies N/V/CP/SOB. Slow progress with PT- required knee immobilizer due to buckling of knee.  Objective:   VITALS:   Vitals:   11/02/20 2223 11/03/20 0134 11/03/20 0547 11/03/20 0952  BP: 119/73 124/83 (!) 152/87 (!) 143/93  Pulse: 63 65 73 67  Resp: 17 15 18 18   Temp: 97.8 F (36.6 C) 97.6 F (36.4 C) 97.9 F (36.6 C) 98.8 F (37.1 C)  TempSrc:   Oral Oral  SpO2: 100% 97% 100% 100%  Weight:      Height:        NAD ABD soft Sensation intact distally Intact pulses distally Dorsiflexion/Plantar flexion intact Incision: dressing C/D/I Compartment soft   Lab Results  Component Value Date   WBC 10.5 11/03/2020   HGB 11.0 (L) 11/03/2020   HCT 34.6 (L) 11/03/2020   MCV 91.5 11/03/2020   PLT 121 (L) 11/03/2020   BMET    Component Value Date/Time   NA 132 (L) 11/03/2020 0310   K 4.0 11/03/2020 0310   CL 99 11/03/2020 0310   CO2 23 11/03/2020 0310   GLUCOSE 167 (H) 11/03/2020 0310   BUN 33 (H) 11/03/2020 0310   CREATININE 1.54 (H) 11/03/2020 0310   CALCIUM 8.5 (L) 11/03/2020 0310   GFRNONAA 49 (L) 11/03/2020 0310   GFRAA >60 07/31/2018 0550     Assessment/Plan: 1 Day Post-Op   Principal Problem:   Osteoarthritis of right knee Active Problems:   S/P TKR (total knee replacement), right   WBAT with walker DVT ppx: Aspirin, SCDs, TEDS PO pain control PT/OT Mild Cr elevation: encourage PO fluids, recheck BMP Dispo: plan for d/c home after clears PT - likely tomorrow   Hilton Cork Hever Castilleja 11/03/2020, 12:58 PM   Rod Can, MD [phone] [place] is now [facility]  [place] [address], [address], [place], [place] [address] Phone: [phone] [email or web address] Facebook  [employer]

## 2020-11-03 NOTE — Plan of Care (Signed)
Plan of care reviewed and discussed with the patient. 

## 2020-11-04 DIAGNOSIS — I25110 Atherosclerotic heart disease of native coronary artery with unstable angina pectoris: Secondary | ICD-10-CM | POA: Diagnosis not present

## 2020-11-04 DIAGNOSIS — I10 Essential (primary) hypertension: Secondary | ICD-10-CM | POA: Diagnosis not present

## 2020-11-04 DIAGNOSIS — Z79899 Other long term (current) drug therapy: Secondary | ICD-10-CM | POA: Diagnosis not present

## 2020-11-04 DIAGNOSIS — I249 Acute ischemic heart disease, unspecified: Secondary | ICD-10-CM | POA: Diagnosis not present

## 2020-11-04 DIAGNOSIS — Z7982 Long term (current) use of aspirin: Secondary | ICD-10-CM | POA: Diagnosis not present

## 2020-11-04 DIAGNOSIS — Z87891 Personal history of nicotine dependence: Secondary | ICD-10-CM | POA: Diagnosis not present

## 2020-11-04 DIAGNOSIS — M1711 Unilateral primary osteoarthritis, right knee: Secondary | ICD-10-CM | POA: Diagnosis not present

## 2020-11-04 LAB — BASIC METABOLIC PANEL
Anion gap: 7 (ref 5–15)
BUN: 30 mg/dL — ABNORMAL HIGH (ref 8–23)
CO2: 25 mmol/L (ref 22–32)
Calcium: 8.5 mg/dL — ABNORMAL LOW (ref 8.9–10.3)
Chloride: 105 mmol/L (ref 98–111)
Creatinine, Ser: 1.47 mg/dL — ABNORMAL HIGH (ref 0.61–1.24)
GFR, Estimated: 52 mL/min — ABNORMAL LOW (ref >60)
Glucose, Bld: 116 mg/dL — ABNORMAL HIGH (ref 70–99)
Potassium: 4.1 mmol/L (ref 3.5–5.1)
Sodium: 137 mmol/L (ref 135–145)

## 2020-11-04 LAB — CBC
HCT: 31.4 % — ABNORMAL LOW (ref 39.0–52.0)
Hemoglobin: 10.1 g/dL — ABNORMAL LOW (ref 13.0–17.0)
MCH: 28.8 pg (ref 26.0–34.0)
MCHC: 32.2 g/dL (ref 30.0–36.0)
MCV: 89.5 fL (ref 80.0–100.0)
Platelets: 101 10*3/uL — ABNORMAL LOW (ref 150–400)
RBC: 3.51 MIL/uL — ABNORMAL LOW (ref 4.22–5.81)
RDW: 14.9 % (ref 11.5–15.5)
WBC: 8.9 10*3/uL (ref 4.0–10.5)
nRBC: 0 % (ref 0.0–0.2)

## 2020-11-04 NOTE — Progress Notes (Signed)
    Subjective:  Patient reports pain as mild to moderate.  Denies N/V/CP/SOB. PT progress has improved  Objective:   VITALS:   Vitals:   11/03/20 1102 11/03/20 1346 11/03/20 2114 11/04/20 0540  BP: 138/90 (!) 135/91 140/76 (!) 151/87  Pulse: 72 67 63 72  Resp: 20 18 20 16   Temp: 98.8 F (37.1 C) 98.9 F (37.2 C) 98.3 F (36.8 C) 98.6 F (37 C)  TempSrc:  Oral Oral Oral  SpO2: 99% 98% 98% 100%  Weight:      Height:        NAD ABD soft Sensation intact distally Intact pulses distally Dorsiflexion/Plantar flexion intact Incision: dressing C/D/I Compartment soft   Lab Results  Component Value Date   WBC 8.9 11/04/2020   HGB 10.1 (L) 11/04/2020   HCT 31.4 (L) 11/04/2020   MCV 89.5 11/04/2020   PLT 101 (L) 11/04/2020   BMET    Component Value Date/Time   NA 137 11/04/2020 0314   K 4.1 11/04/2020 0314   CL 105 11/04/2020 0314   CO2 25 11/04/2020 0314   GLUCOSE 116 (H) 11/04/2020 0314   BUN 30 (H) 11/04/2020 0314   CREATININE 1.47 (H) 11/04/2020 0314   CALCIUM 8.5 (L) 11/04/2020 0314   GFRNONAA 52 (L) 11/04/2020 0314   GFRAA >60 07/31/2018 0550     Assessment/Plan: 2 Days Post-Op   Principal Problem:   Osteoarthritis of right knee Active Problems:   S/P TKR (total knee replacement), right   WBAT with walker DVT ppx: Aspirin, SCDs, TEDS PO pain control PT/OT Mild Cr elevation: encourage PO fluids Dispo: plan for d/c home after clears PT  Dorothyann Peng 11/04/2020, 1:38 PM  [facility] is now [employer] [address], [place], [place] [address] Phone: [phone] [email or web address] Facebook  [employer]

## 2020-11-04 NOTE — Plan of Care (Signed)
Patient discharged home in stable condition 

## 2020-11-04 NOTE — Plan of Care (Signed)
Plan of care reviewed and discussed with the patient. 

## 2020-11-04 NOTE — Progress Notes (Signed)
Physical Therapy Treatment Patient Details Name: Stephen Graves MRN: [id number] DOB: [birth year] Today's Date: 11/04/2020    History of Present Illness Patient is 65 y.o. male s/p Rt TKA on 11/02/20 with PMH significant for HTN, CAD s/p cath and stent, anxiety.    PT Comments    Pt in good spirits and with noted marked improvement in R LE quad strength.  Pt up to ambulate increased distance in hall sans KI and with min assist only for stability.   Follow Up Recommendations  Follow surgeon's recommendation for DC plan and follow-up therapies     Equipment Recommendations  None recommended by PT    Recommendations for Other Services       Precautions / Restrictions Precautions Precautions: Fall Restrictions Weight Bearing Restrictions: No Other Position/Activity Restrictions: WBAT    Mobility  Bed Mobility Overal bed mobility: Needs Assistance Bed Mobility: Supine to Sit     Supine to sit: HOB elevated;Supervision          Transfers Overall transfer level: Needs assistance Equipment used: Rolling walker (2 wheeled) Transfers: Sit to/from Stand Sit to Stand: Min assist         General transfer comment: cues for LE management and use of UEs to self assist  Ambulation/Gait Ambulation/Gait assistance: Min assist;Min guard Gait Distance (Feet): 75 Feet Assistive device: Rolling walker (2 wheeled) Gait Pattern/deviations: Step-to pattern;Decreased stride length;Decreased stance time - right;Shuffle;Trunk flexed Gait velocity: decr   General Gait Details: cues for sequence, posture and position from RW; marked improvement in WB tolerance R LE   Stairs             Wheelchair Mobility    Modified Rankin (Stroke Patients Only)       Balance Overall balance assessment: Needs assistance Sitting-balance support: No upper extremity supported;Feet supported Sitting balance-Leahy Scale: Good     Standing balance support: No upper extremity  supported Standing balance-Leahy Scale: Poor                              Cognition Arousal/Alertness: Awake/alert Behavior During Therapy: Anxious Overall Cognitive Status: No family/caregiver present to determine baseline cognitive functioning Area of Impairment: Attention                   Current Attention Level: Sustained   Following Commands: Follows one step commands consistently;Follows multi-step commands with increased time;Follows multi-step commands inconsistently     Problem Solving: Slow processing        Exercises Total Joint Exercises Ankle Circles/Pumps: AROM;Both;20 reps;Supine Quad Sets: AROM;Right;Supine;15 reps Heel Slides: Right;Supine;AAROM;20 reps Straight Leg Raises: AAROM;Supine;AROM;Right;20 reps Goniometric ROM: AAROM R knee -5 - 95    General Comments        Pertinent Vitals/Pain Pain Assessment: 0-10 Pain Score: 3  Pain Location: behind R calf Pain Descriptors / Indicators: Aching;Tender Pain Intervention(s): Limited activity within patient's tolerance;Monitored during session;Premedicated before session;Ice applied    Home Living                      Prior Function            PT Goals (current goals can now be found in the care plan section) Acute Rehab PT Goals Patient Stated Goal: be able to go to concert/show at end of February PT Goal Formulation: With patient Time For Goal Achievement: 11/09/20 Potential to Achieve Goals: Good Progress towards PT goals: Progressing toward goals  Frequency    7X/week      PT Plan Current plan remains appropriate    Co-evaluation              AM-PAC PT "6 Clicks" Mobility   Outcome Measure  Help needed turning from your back to your side while in a flat bed without using bedrails?: None Help needed moving from lying on your back to sitting on the side of a flat bed without using bedrails?: None Help needed moving to and from a bed to a chair  (including a wheelchair)?: A Little Help needed standing up from a chair using your arms (e.g., wheelchair or bedside chair)?: A Little Help needed to walk in hospital room?: A Little Help needed climbing 3-5 steps with a railing? : A Lot 6 Click Score: 19    End of Session Equipment Utilized During Treatment: Gait belt Activity Tolerance: Patient tolerated treatment well Patient left: in chair;with call bell/phone within reach;with chair alarm set;with nursing/sitter in room Nurse Communication: Mobility status PT Visit Diagnosis: Other abnormalities of gait and mobility (R26.89);Muscle weakness (generalized) (M62.81);Difficulty in walking, not elsewhere classified (R26.2)     Time: 0865-7846 PT Time Calculation (min) (ACUTE ONLY): 30 min  Charges:  $Gait Training: 8-22 mins $Therapeutic Exercise: 8-22 mins                     [place] Pager [phone] Office [phone]    Cleotis Sparr 11/04/2020, 12:10 PM

## 2020-11-04 NOTE — Progress Notes (Signed)
Physical Therapy Treatment Patient Details Name: Stephen Graves MRN: [id number] DOB: April 05, 1956 Today's Date: 11/04/2020    History of Present Illness Patient is 66 y.o. male s/p Rt TKA on 11/02/20 with PMH significant for HTN, CAD s/p cath and stent, anxiety.    PT Comments    Pt progressing steadily with mobility and eager for dc home.  Pt sister present and reviewing HEP, gait and stairs.     Follow Up Recommendations  Follow surgeon's recommendation for DC plan and follow-up therapies     Equipment Recommendations  None recommended by PT    Recommendations for Other Services       Precautions / Restrictions Precautions Precautions: Fall Restrictions Weight Bearing Restrictions: No Other Position/Activity Restrictions: WBAT    Mobility  Bed Mobility               General bed mobility comments: up in chair and requests back to same    Transfers Overall transfer level: Needs assistance Equipment used: Rolling walker (2 wheeled) Transfers: Sit to/from Stand Sit to Stand: Min guard;Supervision         General transfer comment: cues for LE management and use of UEs to self assist  Ambulation/Gait Ambulation/Gait assistance: Min guard;Supervision Gait Distance (Feet): 80 Feet Assistive device: Rolling walker (2 wheeled) Gait Pattern/deviations: Step-to pattern;Decreased stride length;Decreased stance time - right;Shuffle;Trunk flexed Gait velocity: decr   General Gait Details: cues for sequence, posture and position from RW; marked improvement in WB tolerance R LE   Stairs Stairs: Yes Stairs assistance: Min guard Stair Management: One rail Right;One rail Left;Step to pattern;Forwards;With cane Number of Stairs: 4 General stair comments: 2 steps twice with cane and alternate side rails; cues for sequence and foot/cane placement   Wheelchair Mobility    Modified Rankin (Stroke Patients Only)       Balance Overall balance assessment: Needs  assistance Sitting-balance support: No upper extremity supported;Feet supported Sitting balance-Leahy Scale: Good     Standing balance support: No upper extremity supported Standing balance-Leahy Scale: Fair                              Cognition Arousal/Alertness: Awake/alert Behavior During Therapy: Anxious Overall Cognitive Status: No family/caregiver present to determine baseline cognitive functioning Area of Impairment: Attention                   Current Attention Level: Sustained   Following Commands: Follows one step commands consistently;Follows multi-step commands with increased time;Follows multi-step commands inconsistently     Problem Solving: Slow processing General Comments: cues throughout to focus on task and not ask about tomorrow or when he possibly would be ready to go home, etc      Exercises Total Joint Exercises Ankle Circles/Pumps: AROM;Both;20 reps;Supine Quad Sets: AROM;Right;Supine;15 reps Towel Squeeze: Strengthening;Both;10 reps;Seated Heel Slides: Right;Supine;AAROM;20 reps Hip ABduction/ADduction: AROM;10 reps;Right;Supine Straight Leg Raises: AAROM;Supine;AROM;Right;20 reps Long Arc Quad: AROM;Right;10 reps;Supine    General Comments        Pertinent Vitals/Pain Pain Assessment: 0-10 Pain Score: 3  Pain Location: behind knee Pain Descriptors / Indicators: Aching;Tender Pain Intervention(s): Limited activity within patient's tolerance;Monitored during session;Premedicated before session;Ice applied    Home Living                      Prior Function            PT Goals (current goals can now be found  in the care plan section) Acute Rehab PT Goals Patient Stated Goal: be able to go to concert/show at end of February PT Goal Formulation: With patient Time For Goal Achievement: 11/09/20 Potential to Achieve Goals: Good Progress towards PT goals: Progressing toward goals    Frequency     7X/week      PT Plan Current plan remains appropriate    Co-evaluation              AM-PAC PT "6 Clicks" Mobility   Outcome Measure  Help needed turning from your back to your side while in a flat bed without using bedrails?: None Help needed moving from lying on your back to sitting on the side of a flat bed without using bedrails?: None Help needed moving to and from a bed to a chair (including a wheelchair)?: A Little Help needed standing up from a chair using your arms (e.g., wheelchair or bedside chair)?: A Little Help needed to walk in hospital room?: A Little Help needed climbing 3-5 steps with a railing? : A Little 6 Click Score: 20    End of Session Equipment Utilized During Treatment: Gait belt Activity Tolerance: Patient tolerated treatment well Patient left: in chair;with call bell/phone within reach;with chair alarm set;with nursing/sitter in room Nurse Communication: Mobility status PT Visit Diagnosis: Other abnormalities of gait and mobility (R26.89);Muscle weakness (generalized) (M62.81);Difficulty in walking, not elsewhere classified (R26.2)     Time: 7680-8811 PT Time Calculation (min) (ACUTE ONLY): 42 min  Charges:  $Gait Training: 8-22 mins $Therapeutic Exercise: 8-22 mins $Therapeutic Activity: 8-22 mins                     Debe Coder PT Acute Rehabilitation Services Pager [phone] Office [phone]    Mayuri Staples 11/04/2020, 4:14 PM

## 2020-11-07 DIAGNOSIS — M6281 Muscle weakness (generalized): Secondary | ICD-10-CM | POA: Diagnosis not present

## 2020-11-07 DIAGNOSIS — R2689 Other abnormalities of gait and mobility: Secondary | ICD-10-CM | POA: Diagnosis not present

## 2020-11-07 DIAGNOSIS — Z96651 Presence of right artificial knee joint: Secondary | ICD-10-CM | POA: Diagnosis not present

## 2020-11-07 DIAGNOSIS — Z471 Aftercare following joint replacement surgery: Secondary | ICD-10-CM | POA: Diagnosis not present

## 2020-11-07 DIAGNOSIS — M25661 Stiffness of right knee, not elsewhere classified: Secondary | ICD-10-CM | POA: Diagnosis not present

## 2020-11-07 NOTE — Discharge Summary (Addendum)
Physician Discharge Summary  Patient ID: Stephen Graves MRN: [id number] DOB/AGE: 04-04-1956 65 y.o.  Admit date: 11/02/2020 Discharge date: 11/04/2020  Admission Diagnoses:  Osteoarthritis of right knee  Discharge Diagnoses:  Principal Problem:   Osteoarthritis of right knee Active Problems:   S/P TKR (total knee replacement), right   Past Medical History:  Diagnosis Date  . Anxiety   . Coronary artery disease involving native coronary 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 ([place]) 07/18/2017   Cardiac cath 1020 1418: Mid LAD to Dist LAD lesion, 99 %stenosed. - PCI with resolute DES (2.5 X 26 mm Resolute Onyx stent - 2.75 mm);ostial LAD 30%. Ostial ramus 40%. Proximal to mid LAD 50%. Normal LV function and normal LVEDP.   [name] Hiatal hernia   . High cholesterol   . HTN (hypertension)   . 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 Skull fracture (HCC)     Surgeries: Procedure(s): COMPUTER ASSISTED TOTAL KNEE ARTHROPLASTY on 11/02/2020   Consultants (if any):   Discharged Condition: Improved  Hospital Course: Stephen Graves is an 65 y.o. male who was admitted 11/02/2020 with a diagnosis of Osteoarthritis of right knee and went to the operating room on 11/02/2020 and underwent the above named procedures.    He was given perioperative antibiotics:  Anti-infectives (From admission, onward)   Start     Dose/Rate Route Frequency Ordered Stop   11/02/20 1800  ceFAZolin (ANCEF) IVPB 2g/100 mL premix        2 g 200 mL/hr over 30 Minutes Intravenous Every 6 hours 11/02/20 1630 11/03/20 0618   11/02/20 0745  ceFAZolin (ANCEF) IVPB 2g/100 mL premix        2 g 200 mL/hr over 30 Minutes Intravenous On call to O.R. 11/02/20 3716 11/02/20 1150    .  He was given sequential compression devices, early ambulation, and aspirin for DVT prophylaxis.  He benefited maximally from the hospital stay and there were no complications.    Recent vital signs:  Vitals:    11/03/20 2114 11/04/20 0540  BP: 140/76 (!) 151/87  Pulse: 63 72  Resp: 20 16  Temp: 98.3 F (36.8 C) 98.6 F (37 C)  SpO2: 98% 100%    Recent laboratory studies:  Lab Results  Component Value Date   HGB 10.1 (L) 11/04/2020   HGB 11.0 (L) 11/03/2020   HGB 12.7 (L) 10/25/2020   Lab Results  Component Value Date   WBC 8.9 11/04/2020   PLT 101 (L) 11/04/2020   Lab Results  Component Value Date   INR 1.1 10/25/2020   Lab Results  Component Value Date   NA 137 11/04/2020   K 4.1 11/04/2020   CL 105 11/04/2020   CO2 25 11/04/2020   BUN 30 (H) 11/04/2020   CREATININE 1.47 (H) 11/04/2020   GLUCOSE 116 (H) 11/04/2020    Discharge Medications:   Allergies as of 11/04/2020      Reactions   Lipitor [atorvastatin]    Pain in neck and legs after taking medication      Medication List    STOP taking these medications   aspirin EC 81 MG tablet Replaced by: aspirin 81 MG chewable tablet     TAKE these medications   aspirin 81 MG chewable tablet Commonly known as: Aspirin Childrens Chew 1 tablet (81 mg total) by mouth 2 (two) times daily with a meal. Replaces: aspirin EC 81 MG tablet   carvedilol 3.125 MG tablet  Commonly known as: COREG Take 3.125 mg by mouth 2 (two) times daily with a meal.   citalopram 40 MG tablet Commonly known as: CELEXA Take 40 mg by mouth daily.   docusate sodium 100 MG capsule Commonly known as: Colace Take 1 capsule (100 mg total) by mouth 2 (two) times daily.   fish oil-omega-3 fatty acids 1000 MG capsule Take 1 g by mouth daily.   HYDROcodone-acetaminophen 5-325 MG tablet Commonly known as: Norco Take 1 tablet by mouth every 4 (four) hours as needed for moderate pain.   losartan-hydrochlorothiazide 100-25 MG tablet Commonly known as: HYZAAR Take 1 tablet by mouth daily.   nitroGLYCERIN 0.4 MG SL tablet Commonly known as: NITROSTAT Place 0.4 mg under the tongue every 5 (five) minutes as needed for chest pain.   ondansetron 4  MG tablet Commonly known as: Zofran Take 1 tablet (4 mg total) by mouth every 8 (eight) hours as needed for nausea or vomiting.   potassium chloride SA 20 MEQ tablet Commonly known as: KLOR-CON Take 20 mEq by mouth daily.   rosuvastatin 20 MG tablet Commonly known as: CRESTOR Take 1 tablet (20 mg total) by mouth daily.   senna 8.6 MG Tabs tablet Commonly known as: SENOKOT Take 2 tablets (17.2 mg total) by mouth at bedtime.       Diagnostic Studies: DG Knee Right Port  Result Date: 11/02/2020 CLINICAL DATA:  Status post right total knee replacement. EXAM: PORTABLE RIGHT KNEE - 1-2 VIEW COMPARISON:  Right knee radiograph August 22, 2017 FINDINGS: Postsurgical changes of recent 3 component right total knee arthroplasty, including subcutaneous edema and gas with anatomic alignment. No evidence of acute hardware complication. IMPRESSION: Status post right total knee arthroplasty without evidence of acute complication. Electronically Signed   By: Dahlia Bailiff MD   On: 11/02/2020 15:17    Disposition: Discharge disposition: 01-Home or Self Care     Dental Antibiotics:  In most cases prophylactic antibiotics for Dental procdeures after total joint surgery are not necessary.  Exceptions are as follows:  1. History of prior total joint infection  2. Severely immunocompromised (Organ Transplant, cancer chemotherapy, Rheumatoid biologic meds such as [place])  3. Poorly controlled diabetes (A1C &gt; 8.0, blood glucose over 200)  If you have one of these conditions, contact your surgeon for an antibiotic prescription, prior to your dental procedure.  Discharge Instructions    Call MD / Call 911   Complete by: As directed    If you experience chest pain or shortness of breath, CALL 911 and be transported to the hospital emergency room.  If you develope a fever above 101 F, pus (white drainage) or increased drainage or redness at the wound, or calf pain, call your surgeon's office.    Constipation Prevention   Complete by: As directed    Drink plenty of fluids.  Prune juice may be helpful.  You may use a stool softener, such as Colace (over the counter) 100 mg twice a day.  Use MiraLax (over the counter) for constipation as needed.   Diet - low sodium heart healthy   Complete by: As directed    Do not put a pillow under the knee. Place it under the heel.   Complete by: As directed    Driving restrictions   Complete by: As directed    No driving for 6 weeks   Increase activity slowly as tolerated   Complete by: As directed    Lifting restrictions   Complete by:  As directed    No lifting for 6 weeks   TED hose   Complete by: As directed    Use stockings (TED hose) for 2 weeks on both leg(s).  You may remove them at night for sleeping.       Follow-up Information    Rod Can, MD. Go on 11/15/2020.   Specialty: Orthopedic Surgery Why: You are scheduled for a post-operative appointment on 11-15-20 at 10:00 am.  Contact information: [address] [place] [address] [phone]                Signed: Dorothyann Peng 11/07/2020, 9:49 AM

## 2020-11-09 DIAGNOSIS — Z96651 Presence of right artificial knee joint: Secondary | ICD-10-CM | POA: Diagnosis not present

## 2020-11-09 DIAGNOSIS — Z471 Aftercare following joint replacement surgery: Secondary | ICD-10-CM | POA: Diagnosis not present

## 2020-11-09 DIAGNOSIS — R2689 Other abnormalities of gait and mobility: Secondary | ICD-10-CM | POA: Diagnosis not present

## 2020-11-09 DIAGNOSIS — M6281 Muscle weakness (generalized): Secondary | ICD-10-CM | POA: Diagnosis not present

## 2020-11-09 DIAGNOSIS — M25661 Stiffness of right knee, not elsewhere classified: Secondary | ICD-10-CM | POA: Diagnosis not present

## 2020-11-11 DIAGNOSIS — Z96651 Presence of right artificial knee joint: Secondary | ICD-10-CM | POA: Diagnosis not present

## 2020-11-11 DIAGNOSIS — M25661 Stiffness of right knee, not elsewhere classified: Secondary | ICD-10-CM | POA: Diagnosis not present

## 2020-11-11 DIAGNOSIS — Z471 Aftercare following joint replacement surgery: Secondary | ICD-10-CM | POA: Diagnosis not present

## 2020-11-11 DIAGNOSIS — R2689 Other abnormalities of gait and mobility: Secondary | ICD-10-CM | POA: Diagnosis not present

## 2020-11-11 DIAGNOSIS — M6281 Muscle weakness (generalized): Secondary | ICD-10-CM | POA: Diagnosis not present

## 2020-11-14 DIAGNOSIS — R2689 Other abnormalities of gait and mobility: Secondary | ICD-10-CM | POA: Diagnosis not present

## 2020-11-14 DIAGNOSIS — M25661 Stiffness of right knee, not elsewhere classified: Secondary | ICD-10-CM | POA: Diagnosis not present

## 2020-11-14 DIAGNOSIS — M6281 Muscle weakness (generalized): Secondary | ICD-10-CM | POA: Diagnosis not present

## 2020-11-14 DIAGNOSIS — Z471 Aftercare following joint replacement surgery: Secondary | ICD-10-CM | POA: Diagnosis not present

## 2020-11-14 DIAGNOSIS — Z96651 Presence of right artificial knee joint: Secondary | ICD-10-CM | POA: Diagnosis not present

## 2020-11-15 DIAGNOSIS — Z471 Aftercare following joint replacement surgery: Secondary | ICD-10-CM | POA: Diagnosis not present

## 2020-11-15 DIAGNOSIS — Z96651 Presence of right artificial knee joint: Secondary | ICD-10-CM | POA: Diagnosis not present

## 2020-11-16 DIAGNOSIS — Z96651 Presence of right artificial knee joint: Secondary | ICD-10-CM | POA: Diagnosis not present

## 2020-11-16 DIAGNOSIS — M25661 Stiffness of right knee, not elsewhere classified: Secondary | ICD-10-CM | POA: Diagnosis not present

## 2020-11-16 DIAGNOSIS — R2689 Other abnormalities of gait and mobility: Secondary | ICD-10-CM | POA: Diagnosis not present

## 2020-11-16 DIAGNOSIS — M6281 Muscle weakness (generalized): Secondary | ICD-10-CM | POA: Diagnosis not present

## 2020-11-16 DIAGNOSIS — Z471 Aftercare following joint replacement surgery: Secondary | ICD-10-CM | POA: Diagnosis not present

## 2020-11-18 DIAGNOSIS — Z96651 Presence of right artificial knee joint: Secondary | ICD-10-CM | POA: Diagnosis not present

## 2020-11-18 DIAGNOSIS — R2689 Other abnormalities of gait and mobility: Secondary | ICD-10-CM | POA: Diagnosis not present

## 2020-11-18 DIAGNOSIS — Z471 Aftercare following joint replacement surgery: Secondary | ICD-10-CM | POA: Diagnosis not present

## 2020-11-18 DIAGNOSIS — M25661 Stiffness of right knee, not elsewhere classified: Secondary | ICD-10-CM | POA: Diagnosis not present

## 2020-11-18 DIAGNOSIS — M6281 Muscle weakness (generalized): Secondary | ICD-10-CM | POA: Diagnosis not present

## 2020-12-16 DIAGNOSIS — Z96651 Presence of right artificial knee joint: Secondary | ICD-10-CM | POA: Diagnosis not present

## 2020-12-16 DIAGNOSIS — Z471 Aftercare following joint replacement surgery: Secondary | ICD-10-CM | POA: Diagnosis not present

## 2021-01-02 DIAGNOSIS — K1230 Oral mucositis (ulcerative), unspecified: Secondary | ICD-10-CM | POA: Diagnosis not present

## 2021-01-21 DIAGNOSIS — I25118 Atherosclerotic heart disease of native coronary artery with other forms of angina pectoris: Secondary | ICD-10-CM | POA: Diagnosis not present

## 2021-01-21 DIAGNOSIS — E785 Hyperlipidemia, unspecified: Secondary | ICD-10-CM | POA: Diagnosis not present

## 2021-01-21 DIAGNOSIS — I10 Essential (primary) hypertension: Secondary | ICD-10-CM | POA: Diagnosis not present

## 2021-02-13 DIAGNOSIS — M1991 Primary osteoarthritis, unspecified site: Secondary | ICD-10-CM | POA: Diagnosis not present

## 2021-02-13 DIAGNOSIS — I10 Essential (primary) hypertension: Secondary | ICD-10-CM | POA: Diagnosis not present

## 2021-02-13 DIAGNOSIS — Z6833 Body mass index (BMI) 33.0-33.9, adult: Secondary | ICD-10-CM | POA: Diagnosis not present

## 2021-02-13 DIAGNOSIS — E6609 Other obesity due to excess calories: Secondary | ICD-10-CM | POA: Diagnosis not present

## 2021-03-23 DIAGNOSIS — E785 Hyperlipidemia, unspecified: Secondary | ICD-10-CM | POA: Diagnosis not present

## 2021-03-23 DIAGNOSIS — I10 Essential (primary) hypertension: Secondary | ICD-10-CM | POA: Diagnosis not present

## 2021-03-23 DIAGNOSIS — I25118 Atherosclerotic heart disease of native coronary artery with other forms of angina pectoris: Secondary | ICD-10-CM | POA: Diagnosis not present

## 2021-05-04 ENCOUNTER — Telehealth: Payer: Self-pay | Admitting: Cardiology

## 2021-05-04 NOTE — Telephone Encounter (Signed)
Patient called stating that he continues to have episodes of lightheadedness.Stephen Graves

## 2021-05-04 NOTE — Telephone Encounter (Signed)
Pt stated he was satisfied with seeing Dr. Harl Bowie on 8/30. Pt stated that lightheadedness comes and goes from time to time and PCP told him it was nothing to worry about

## 2021-05-09 DIAGNOSIS — H52203 Unspecified astigmatism, bilateral: Secondary | ICD-10-CM | POA: Diagnosis not present

## 2021-05-09 DIAGNOSIS — H47293 Other optic atrophy, bilateral: Secondary | ICD-10-CM | POA: Diagnosis not present

## 2021-05-09 DIAGNOSIS — Z96651 Presence of right artificial knee joint: Secondary | ICD-10-CM | POA: Diagnosis not present

## 2021-05-09 DIAGNOSIS — H2513 Age-related nuclear cataract, bilateral: Secondary | ICD-10-CM | POA: Diagnosis not present

## 2021-05-09 DIAGNOSIS — H5213 Myopia, bilateral: Secondary | ICD-10-CM | POA: Diagnosis not present

## 2021-05-09 DIAGNOSIS — H524 Presbyopia: Secondary | ICD-10-CM | POA: Diagnosis not present

## 2021-05-09 DIAGNOSIS — Z471 Aftercare following joint replacement surgery: Secondary | ICD-10-CM | POA: Diagnosis not present

## 2021-05-23 ENCOUNTER — Encounter: Payer: Self-pay | Admitting: Cardiology

## 2021-05-23 ENCOUNTER — Ambulatory Visit (INDEPENDENT_AMBULATORY_CARE_PROVIDER_SITE_OTHER): Payer: Medicare HMO | Admitting: Cardiology

## 2021-05-23 ENCOUNTER — Other Ambulatory Visit: Payer: Self-pay

## 2021-05-23 VITALS — BP 134/88 | HR 62 | Ht 71.0 in | Wt 234.0 lb

## 2021-05-23 DIAGNOSIS — E782 Mixed hyperlipidemia: Secondary | ICD-10-CM

## 2021-05-23 DIAGNOSIS — I10 Essential (primary) hypertension: Secondary | ICD-10-CM | POA: Diagnosis not present

## 2021-05-23 DIAGNOSIS — I2510 Atherosclerotic heart disease of native coronary artery without angina pectoris: Secondary | ICD-10-CM | POA: Diagnosis not present

## 2021-05-23 MED ORDER — LOSARTAN POTASSIUM-HCTZ 100-12.5 MG PO TABS: 1.0000 | ORAL_TABLET | Freq: Every day | ORAL | 3 refills | Status: DC

## 2021-05-23 MED ORDER — ASPIRIN EC 81 MG PO TBEC: 81.0000 mg | DELAYED_RELEASE_TABLET | Freq: Every day | ORAL | 3 refills | Status: DC

## 2021-05-23 NOTE — Patient Instructions (Addendum)
Medication Instructions:  Your physician has recommended you make the following change in your medication:  DECREASE Hyzaar to 100/12.5 mg tablets daily  *If you need a refill on your cardiac medications before your next appointment, please call your pharmacy*   Lab Work: We have requested your most recent lab work from you Primary Care Provider. If you have labs (blood work) drawn today and your tests are completely normal, you will receive your results only by: [place] (if you have MyChart) OR A paper copy in the mail If you have any lab test that is abnormal or we need to change your treatment, we will call you to review the results.   Testing/Procedures: None   Follow-Up: At [facility], you and your health needs are our priority.  As part of our continuing mission to provide you with exceptional heart care, we have created designated Provider Care Teams.  These Care Teams include your primary Cardiologist (physician) and Advanced Practice Providers (APPs -  Physician Assistants and Nurse Practitioners) who all work together to provide you with the care you need, when you need it.  We recommend signing up for the patient portal called "MyChart".  Sign up information is provided on this After Visit Summary.  MyChart is used to connect with patients for Virtual Visits (Telemedicine).  Patients are able to view lab/test results, encounter notes, upcoming appointments, etc.  Non-urgent messages can be sent to your provider as well.   To learn more about what you can do with MyChart, go to [email or web address].    Your next appointment:   6 month(s)  The format for your next appointment:   In Person  Provider:   Carlyle Dolly, MD   Other Instructions

## 2021-05-23 NOTE — Progress Notes (Signed)
Clinical Summary [name] is a 65 y.o.male former patient of Dr Bronson Ing, this is our first visit together. Seen for the following medical problems.   1.CAD -prior DES to mid distal LAD in 06/2017, small septal Leeah Politano jailed - chronic chest pains unchanged, often starts in back and radiates upward. Better with sodas. - no SOB/DOE - compliant with meds   2. HTN - home bp's 110s/60s, occasional low bp's SBPs in the 90s  3. Hyperlipidemia - labs followed by pcp - he is on crestor.   4.Lightheadedness - only occurs with standing - bottles of water x 1-3, occasoinal coffee, diet pepsi bottles x 1,   Past Medical History:  Diagnosis Date   Anxiety    Coronary artery disease involving native coronary 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 ([place]) 07/18/2017   Cardiac cath 1020 1418: Mid LAD to Dist LAD lesion, 99 %stenosed. - PCI with resolute DES (2.5 X 26 mm Resolute Onyx stent - 2.75 mm);ostial LAD 30%. Ostial ramus 40%. Proximal to mid LAD 50%. Normal LV function and normal LVEDP.    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     citalopram (CELEXA) 40 MG tablet Take 40 mg by mouth daily.      fish oil-omega-3 fatty acids 1000 MG capsule Take 1 g by mouth daily.     HYDROcodone-acetaminophen (NORCO) 5-325 MG tablet Take 1 tablet by mouth every 4 (four) hours as needed for moderate pain. 40 tablet 0   losartan-hydrochlorothiazide (HYZAAR) 100-25 MG tablet Take 1 tablet by mouth daily.     nitroGLYCERIN (NITROSTAT) 0.4 MG SL tablet Place 0.4 mg under the tongue every 5 (five) minutes as needed  for chest pain.     ondansetron (ZOFRAN) 4 MG tablet Take 1 tablet (4 mg total) by mouth every 8 (eight) hours as needed for nausea or vomiting. 20 tablet 0   potassium chloride SA (K-DUR,KLOR-CON) 20 MEQ tablet Take 20 mEq by mouth daily.     rosuvastatin (CRESTOR) 20 MG tablet Take 1 tablet (20 mg total) by mouth daily. 90 tablet 3   No current facility-administered medications for this visit.     Past Surgical History:  Procedure Laterality Date   CORONARY STENT INTERVENTION N/A 07/17/2017   Procedure: CORONARY STENT INTERVENTION;  Surgeon: Wellington Hampshire, MD;  Location: [facility];  Service: Cardiovascular;  Laterality: N/A;   FOOT SURGERY Left    KNEE ARTHROPLASTY Left 07/30/2018   Procedure: LEFT TOTAL KNEE ARTHROPLASTY WITH COMPUTER NAVIGATION;  Surgeon: Rod Can, MD;  Location: [facility];  Service: Orthopedics;  Laterality: Left;  Needs RNFA   KNEE ARTHROPLASTY Right 11/02/2020   Procedure: COMPUTER ASSISTED TOTAL KNEE ARTHROPLASTY;  Surgeon: Rod Can, MD;  Location: [facility];  Service: Orthopedics;  Laterality: Right;  112mn   LEFT HEART CATH AND CORONARY ANGIOGRAPHY N/A 07/17/2017   Procedure: LEFT HEART CATH AND CORONARY ANGIOGRAPHY;  Surgeon: AWellington Hampshire MD;  [facility];  Service: Cardiovascular;  Laterality:  N/A;   left wrist surgery     SKIN GRAFT  1998   TONSILLECTOMY       Allergies  Allergen Reactions   Lipitor [Atorvastatin]     Pain in neck and legs after taking medication      Family History  Problem Relation Age of Onset   Stroke Father 60   Heart attack Father 75   CAD Mother    Prostate cancer Brother    Thyroid nodules Brother      Social History Mr. Kichline reports that he quit smoking about 32 years ago. His smoking use included cigarettes. He has never used smokeless tobacco. Mr. Frede reports no history of alcohol use.   Review of Systems CONSTITUTIONAL: No weight loss, fever, chills,  weakness or fatigue.  HEENT: Eyes: No visual loss, blurred vision, double vision or yellow sclerae.No hearing loss, sneezing, congestion, runny nose or sore throat.  SKIN: No rash or itching.  CARDIOVASCULAR: per hpi RESPIRATORY: No shortness of breath, cough or sputum.  GASTROINTESTINAL: No anorexia, nausea, vomiting or diarrhea. No abdominal pain or blood.  GENITOURINARY: No burning on urination, no polyuria NEUROLOGICAL: No headache, dizziness, syncope, paralysis, ataxia, numbness or tingling in the extremities. No change in bowel or bladder control.  MUSCULOSKELETAL: No muscle, back pain, joint pain or stiffness.  LYMPHATICS: No enlarged nodes. No history of splenectomy.  PSYCHIATRIC: No history of depression or anxiety.  ENDOCRINOLOGIC: No reports of sweating, cold or heat intolerance. No polyuria or polydipsia.  [name]   Physical Examination Today's Vitals   05/23/21 1049  BP: 134/88  Pulse: 62  SpO2: 98%  Weight: 234 lb (106.1 kg)  Height: '5\' 11"'$  (1.803 m)   Body mass index is 32.64 kg/m.  Gen: resting comfortably, no acute distress HEENT: no scleral icterus, pupils equal round and reactive, no palptable cervical adenopathy,  CV: RRR, no m/r/g no jvd Resp: Clear to auscultation bilaterally GI: abdomen is soft, non-tender, non-distended, normal bowel sounds, no hepatosplenomegaly MSK: extremities are warm, no edema.  Skin: warm, no rash Neuro:  no focal deficits Psych: appropriate affect   Diagnostic Studies Echocardiogram: 06/2017 Study Conclusions   - Left ventricle: The cavity size was normal. Wall thickness was    increased in a pattern of mild LVH. Systolic function was normal.    The estimated ejection fraction was in the range of 60% to 65%.    Wall motion was normal; there were no regional wall motion    abnormalities. Diastolic dysfunction, grade indeterminate.  - Aortic valve: Moderately calcified annulus. Trileaflet.    Cardiac Catheterization:  06/2017 The left ventricular systolic function is normal. LV end diastolic pressure is normal. The left ventricular ejection fraction is 55-65% by visual estimate. Ost LAD lesion, 30 %stenosed. Ost Ramus to Ramus lesion, 40 %stenosed. Prox LAD to Mid LAD lesion, 50 %stenosed. A drug eluting stent was successfully placed. Mid LAD to Dist LAD lesion, 99 %stenosed. Post intervention, there is a 0% residual stenosis.   1.  Severe one-vessel coronary artery disease with 99% stenosis in the mid to distal LAD. 2.  Normal LV systolic function and normal left ventricular end-diastolic pressure. 3.  Successful angioplasty and drug-eluting stent placement to the mid/distal LAD.  A small septal Gina Costilla was jailed by the stent with sluggish flow.  This resulted in chest pain and was treated with nitroglycerin drip.   Post cath Recommendations: Dual antiplatelet therapy for at least one year.  Aggressive treatment of risk factors.  Continue  nitroglycerin drip as needed for chest pain.    Assessment and Plan   1.CAD - no symptoms, continue current meds - EKG today shows SR, no acute ischemic changes  2. Hyperlipidemia - requst pcp labs continue statin   3. HTN - home numbers at goal - reports orthostatic dizzienss. He is orthostatic by SBP today, decreases 23 points with lying to sitting, 15 points with sitting to standing. Lower his hyzaar from 100/25 to 100/12.5 and encouraged increased oral hydration      Arnoldo Lenis, M.D.

## 2021-05-28 ENCOUNTER — Inpatient Hospital Stay (HOSPITAL_COMMUNITY)
Admission: EM | Admit: 2021-05-28 | Discharge: 2021-05-31 | DRG: 247 | Disposition: A | Discharge status: Home / Self Care | Payer: Medicare HMO | Attending: Cardiology | Admitting: Cardiology

## 2021-05-28 ENCOUNTER — Emergency Department (HOSPITAL_COMMUNITY): Payer: Medicare HMO

## 2021-05-28 ENCOUNTER — Encounter (HOSPITAL_COMMUNITY): Payer: Self-pay

## 2021-05-28 ENCOUNTER — Other Ambulatory Visit: Payer: Self-pay

## 2021-05-28 ENCOUNTER — Inpatient Hospital Stay (HOSPITAL_COMMUNITY): Payer: Medicare HMO

## 2021-05-28 DIAGNOSIS — Z955 Presence of coronary angioplasty implant and graft: Secondary | ICD-10-CM | POA: Diagnosis not present

## 2021-05-28 DIAGNOSIS — Z7982 Long term (current) use of aspirin: Secondary | ICD-10-CM | POA: Diagnosis not present

## 2021-05-28 DIAGNOSIS — I493 Ventricular premature depolarization: Secondary | ICD-10-CM | POA: Diagnosis present

## 2021-05-28 DIAGNOSIS — I10 Essential (primary) hypertension: Secondary | ICD-10-CM

## 2021-05-28 DIAGNOSIS — R7303 Prediabetes: Secondary | ICD-10-CM | POA: Diagnosis present

## 2021-05-28 DIAGNOSIS — D631 Anemia in chronic kidney disease: Secondary | ICD-10-CM | POA: Diagnosis present

## 2021-05-28 DIAGNOSIS — R079 Chest pain, unspecified: Secondary | ICD-10-CM | POA: Diagnosis present

## 2021-05-28 DIAGNOSIS — Z20822 Contact with and (suspected) exposure to covid-19: Secondary | ICD-10-CM | POA: Diagnosis present

## 2021-05-28 DIAGNOSIS — N1831 Chronic kidney disease, stage 3a: Secondary | ICD-10-CM | POA: Diagnosis not present

## 2021-05-28 DIAGNOSIS — Z87891 Personal history of nicotine dependence: Secondary | ICD-10-CM | POA: Diagnosis not present

## 2021-05-28 DIAGNOSIS — Z8249 Family history of ischemic heart disease and other diseases of the circulatory system: Secondary | ICD-10-CM | POA: Diagnosis not present

## 2021-05-28 DIAGNOSIS — I25110 Atherosclerotic heart disease of native coronary artery with unstable angina pectoris: Secondary | ICD-10-CM | POA: Diagnosis not present

## 2021-05-28 DIAGNOSIS — Z96653 Presence of artificial knee joint, bilateral: Secondary | ICD-10-CM | POA: Diagnosis present

## 2021-05-28 DIAGNOSIS — R7989 Other specified abnormal findings of blood chemistry: Secondary | ICD-10-CM | POA: Diagnosis present

## 2021-05-28 DIAGNOSIS — E785 Hyperlipidemia, unspecified: Secondary | ICD-10-CM | POA: Diagnosis present

## 2021-05-28 DIAGNOSIS — G8929 Other chronic pain: Secondary | ICD-10-CM | POA: Diagnosis not present

## 2021-05-28 DIAGNOSIS — E7800 Pure hypercholesterolemia, unspecified: Secondary | ICD-10-CM | POA: Diagnosis present

## 2021-05-28 DIAGNOSIS — I129 Hypertensive chronic kidney disease with stage 1 through stage 4 chronic kidney disease, or unspecified chronic kidney disease: Secondary | ICD-10-CM | POA: Diagnosis not present

## 2021-05-28 DIAGNOSIS — R072 Precordial pain: Secondary | ICD-10-CM | POA: Diagnosis not present

## 2021-05-28 DIAGNOSIS — I249 Acute ischemic heart disease, unspecified: Secondary | ICD-10-CM | POA: Diagnosis present

## 2021-05-28 DIAGNOSIS — Z6831 Body mass index (BMI) 31.0-31.9, adult: Secondary | ICD-10-CM | POA: Diagnosis not present

## 2021-05-28 DIAGNOSIS — D649 Anemia, unspecified: Secondary | ICD-10-CM | POA: Diagnosis not present

## 2021-05-28 DIAGNOSIS — F411 Generalized anxiety disorder: Secondary | ICD-10-CM | POA: Diagnosis not present

## 2021-05-28 DIAGNOSIS — Z823 Family history of stroke: Secondary | ICD-10-CM

## 2021-05-28 DIAGNOSIS — Z888 Allergy status to other drugs, medicaments and biological substances status: Secondary | ICD-10-CM | POA: Diagnosis not present

## 2021-05-28 DIAGNOSIS — Z79899 Other long term (current) drug therapy: Secondary | ICD-10-CM

## 2021-05-28 DIAGNOSIS — R0789 Other chest pain: Secondary | ICD-10-CM | POA: Diagnosis not present

## 2021-05-28 DIAGNOSIS — I252 Old myocardial infarction: Secondary | ICD-10-CM

## 2021-05-28 DIAGNOSIS — R69 Illness, unspecified: Secondary | ICD-10-CM | POA: Diagnosis not present

## 2021-05-28 DIAGNOSIS — E669 Obesity, unspecified: Secondary | ICD-10-CM | POA: Diagnosis present

## 2021-05-28 DIAGNOSIS — F419 Anxiety disorder, unspecified: Secondary | ICD-10-CM | POA: Diagnosis not present

## 2021-05-28 DIAGNOSIS — Z8042 Family history of malignant neoplasm of prostate: Secondary | ICD-10-CM

## 2021-05-28 LAB — CREATININE, SERUM
Creatinine, Ser: 1.24 mg/dL (ref 0.61–1.24)
GFR, Estimated: >60 mL/min

## 2021-05-28 LAB — BASIC METABOLIC PANEL
Anion gap: 8 (ref 5–15)
BUN: 17 mg/dL (ref 8–23)
CO2: 27 mmol/L (ref 22–32)
Calcium: 8.9 mg/dL (ref 8.9–10.3)
Chloride: 102 mmol/L (ref 98–111)
Creatinine, Ser: 1.27 mg/dL — ABNORMAL HIGH (ref 0.61–1.24)
GFR, Estimated: >60 mL/min (ref >60)
Glucose, Bld: 111 mg/dL — ABNORMAL HIGH (ref 70–99)
Potassium: 4.4 mmol/L (ref 3.5–5.1)
Sodium: 137 mmol/L (ref 135–145)

## 2021-05-28 LAB — TROPONIN I (HIGH SENSITIVITY)
Troponin I (High Sensitivity): 12 ng/L (ref <18)
Troponin I (High Sensitivity): 15 ng/L (ref <18)

## 2021-05-28 LAB — RESP PANEL BY RT-PCR (FLU A&B, COVID) ARPGX2
Influenza A by PCR: NEGATIVE
Influenza B by PCR: NEGATIVE
SARS Coronavirus 2 by RT PCR: NEGATIVE

## 2021-05-28 LAB — ECHOCARDIOGRAM COMPLETE
AR max vel: 3.42 cm2
AV Area VTI: 3.56 cm2
AV Area mean vel: 3.31 cm2
AV Mean grad: 6 mmHg
AV Peak grad: 10.8 mmHg
Ao pk vel: 1.64 m/s
Area-P 1/2: 3.65 cm2
Height: 71 in
S' Lateral: 3.8 cm
Weight: 3744 oz

## 2021-05-28 LAB — CBC
HCT: 38.4 % — ABNORMAL LOW (ref 39.0–52.0)
HCT: 35.9 % — ABNORMAL LOW (ref 39.0–52.0)
Hemoglobin: 11.8 g/dL — ABNORMAL LOW (ref 13.0–17.0)
Hemoglobin: 11.2 g/dL — ABNORMAL LOW (ref 13.0–17.0)
MCH: 27.1 pg (ref 26.0–34.0)
MCH: 27.3 pg (ref 26.0–34.0)
MCHC: 30.7 g/dL (ref 30.0–36.0)
MCHC: 31.2 g/dL (ref 30.0–36.0)
MCV: 88.1 fL (ref 80.0–100.0)
MCV: 87.3 fL (ref 80.0–100.0)
Platelets: 126 10*3/uL — ABNORMAL LOW (ref 150–400)
Platelets: 124 10*3/uL — ABNORMAL LOW (ref 150–400)
RBC: 4.36 MIL/uL (ref 4.22–5.81)
RBC: 4.11 MIL/uL — ABNORMAL LOW (ref 4.22–5.81)
RDW: 17.1 % — ABNORMAL HIGH (ref 11.5–15.5)
RDW: 17.2 % — ABNORMAL HIGH (ref 11.5–15.5)
WBC: 5 10*3/uL (ref 4.0–10.5)
WBC: 3.7 10*3/uL — ABNORMAL LOW (ref 4.0–10.5)
nRBC: 0 % (ref 0.0–0.2)
nRBC: 0 % (ref 0.0–0.2)

## 2021-05-28 LAB — HIV ANTIBODY (ROUTINE TESTING W REFLEX): HIV Screen 4th Generation wRfx: NONREACTIVE

## 2021-05-28 LAB — HEMOGLOBIN A1C
Hgb A1c MFr Bld: 6.4 % — ABNORMAL HIGH (ref 4.8–5.6)
Mean Plasma Glucose: 136.98 mg/dL

## 2021-05-28 MED ORDER — NITROGLYCERIN 0.4 MG SL SUBL
0.4000 mg | SUBLINGUAL_TABLET | Freq: Once | SUBLINGUAL | Status: AC
  Administered 2021-05-28: 0.4 mg via SUBLINGUAL
  Filled 2021-05-28: qty 1

## 2021-05-28 MED ORDER — NITROGLYCERIN 0.4 MG SL SUBL
0.4000 mg | SUBLINGUAL_TABLET | SUBLINGUAL | Status: DC | PRN
  Administered 2021-05-29 (×2): 0.4 mg via SUBLINGUAL
  Filled 2021-05-28 (×2): qty 1

## 2021-05-28 MED ORDER — ACETAMINOPHEN 325 MG PO TABS
650.0000 mg | ORAL_TABLET | ORAL | Status: DC | PRN
  Administered 2021-05-31: 650 mg via ORAL
  Filled 2021-05-28: qty 2

## 2021-05-28 MED ORDER — CARVEDILOL 3.125 MG PO TABS
3.1250 mg | ORAL_TABLET | Freq: Two times a day (BID) | ORAL | Status: DC
  Administered 2021-05-28 – 2021-05-29 (×2): 3.125 mg via ORAL
  Filled 2021-05-28 (×2): qty 1

## 2021-05-28 MED ORDER — ASPIRIN 300 MG RE SUPP
300.0000 mg | RECTAL | Status: AC
  Filled 2021-05-28: qty 1

## 2021-05-28 MED ORDER — CITALOPRAM HYDROBROMIDE 20 MG PO TABS
40.0000 mg | ORAL_TABLET | Freq: Every day | ORAL | Status: DC
  Administered 2021-05-29: 40 mg via ORAL
  Filled 2021-05-28: qty 2

## 2021-05-28 MED ORDER — ROSUVASTATIN CALCIUM 20 MG PO TABS
20.0000 mg | ORAL_TABLET | Freq: Every day | ORAL | Status: DC
  Administered 2021-05-28 – 2021-05-31 (×4): 20 mg via ORAL
  Filled 2021-05-28 (×4): qty 1

## 2021-05-28 MED ORDER — ASPIRIN EC 81 MG PO TBEC: 81.0000 mg | DELAYED_RELEASE_TABLET | Freq: Every day | ORAL | Status: DC

## 2021-05-28 MED ORDER — LOSARTAN POTASSIUM 50 MG PO TABS
100.0000 mg | ORAL_TABLET | Freq: Every day | ORAL | Status: DC
  Administered 2021-05-28 – 2021-05-31 (×4): 100 mg via ORAL
  Filled 2021-05-28 (×4): qty 2

## 2021-05-28 MED ORDER — ASPIRIN 81 MG PO CHEW
324.0000 mg | CHEWABLE_TABLET | ORAL | Status: AC
  Administered 2021-05-28: 324 mg via ORAL
  Filled 2021-05-28: qty 4

## 2021-05-28 MED ORDER — HEPARIN SODIUM (PORCINE) 5000 UNIT/ML IJ SOLN
5000.0000 [IU] | Freq: Three times a day (TID) | INTRAMUSCULAR | Status: DC
  Administered 2021-05-28 – 2021-05-29 (×4): 5000 [IU] via SUBCUTANEOUS
  Filled 2021-05-28 (×4): qty 1

## 2021-05-28 MED ORDER — NITROGLYCERIN 2 % TD OINT
1.0000 [in_us] | TOPICAL_OINTMENT | Freq: Once | TRANSDERMAL | Status: AC
  Administered 2021-05-28: 1 [in_us] via TOPICAL
  Filled 2021-05-28: qty 1

## 2021-05-28 MED ORDER — OMEGA-3-ACID ETHYL ESTERS 1 G PO CAPS
1.0000 g | ORAL_CAPSULE | Freq: Every day | ORAL | Status: DC
  Administered 2021-05-29 – 2021-05-31 (×3): 1 g via ORAL
  Filled 2021-05-28 (×3): qty 1

## 2021-05-28 MED ORDER — ONDANSETRON HCL 4 MG/2ML IJ SOLN: 4.0000 mg | Freq: Four times a day (QID) | INTRAMUSCULAR | Status: DC | PRN

## 2021-05-28 MED ORDER — OMEGA-3 FATTY ACIDS 1000 MG PO CAPS: 1.0000 g | ORAL_CAPSULE | Freq: Every day | ORAL | Status: DC

## 2021-05-28 NOTE — ED Triage Notes (Signed)
Pt presents to ED with complaints of mid chest pain started at 0105 this am, took 2 nitro helped some last night but not this am, took 2 nitro at 0430 this am. Pt states pain feels like "a real bad hurt" and hurts into left arm

## 2021-05-28 NOTE — Progress Notes (Signed)
  Echocardiogram 2D Echocardiogram has been performed.  Merrie Roof F 05/28/2021, 4:48 PM

## 2021-05-28 NOTE — Progress Notes (Signed)
Patient transferred from [facility] ED. Upon admission, patient experiencing mild chest pain, 1/10, when ambulating to the bathroom. Nitro paste cover still in place on center of chest. Placed on bedside monitor; rhythm irregular. All other VSS. Provider to bedside. Assessment completed. Echo completed. Admission completed.  Patient oriented to room and unit. Bed locked and low. Will continue to monitor.

## 2021-05-28 NOTE — ED Provider Notes (Addendum)
[facility] EMERGENCY DEPARTMENT Provider Note   CSN: [id number] Arrival date & time: 05/28/21  0757     History Chief Complaint  Patient presents with   Chest Pain    Stephen Graves is a 65 y.o. male.   Chest Pain Associated symptoms: no abdominal pain, no back pain, no cough, no shortness of breath and no weakness   Patient presents with chest pain.  Woke with it around 1 in the morning.  Took nitroglycerin and went away.  Woke up again with it at 4 in the morning nitroglycerin did not make it go away this time.  Dull in the mid chest.  Does get some chronic chest pain but states that usually starts in the back and then comes forward.  States that will go away with medicine.  States he uses nitroglycerin which she has not had to use in years.  Has been doing well the last few days.  No fevers or chills.  No coughing.  No swelling his legs.  Does have a history of stent and states this feels the same way.  Pain is dull.  Patient states it radiates to his left arm.    Past Medical History:  Diagnosis Date   Anxiety    Coronary artery disease involving native coronary 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 ([place]) 07/18/2017   Cardiac cath 1020 1418: Mid LAD to Dist LAD lesion, 99 %stenosed. - PCI with resolute DES (2.5 X 26 mm Resolute Onyx stent - 2.75 mm);ostial LAD 30%. Ostial ramus 40%. Proximal to mid LAD 50%. Normal LV function and normal LVEDP.    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   Skull fracture [facility])     Patient Active Problem List   Diagnosis Date Noted   Osteoarthritis of right knee 11/02/2020   S/P TKR (total knee replacement), right 11/02/2020   Osteoarthritis of left knee 07/30/2018   Coronary artery disease involving native coronary 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 ([place]) 07/18/2017   Acute coronary syndrome ([place])    Chest pain 07/16/2017   HLD  (hyperlipidemia) 07/16/2017   Anxiety state 02/15/2010   Essential hypertension 02/15/2010   CHEST PAIN UNSPECIFIED 02/15/2010    Past Surgical History:  Procedure Laterality Date   CORONARY STENT INTERVENTION N/A 07/17/2017   Procedure: CORONARY STENT INTERVENTION;  Surgeon: Wellington Hampshire, MD;  Location: [place] CV LAB;  Service: Cardiovascular;  Laterality: N/A;   FOOT SURGERY Left    KNEE ARTHROPLASTY Left 07/30/2018   Procedure: LEFT TOTAL KNEE ARTHROPLASTY WITH COMPUTER NAVIGATION;  Surgeon: Rod Can, MD;  Location: [facility];  Service: Orthopedics;  Laterality: Left;  Needs RNFA   KNEE ARTHROPLASTY Right 11/02/2020   Procedure: COMPUTER ASSISTED TOTAL KNEE ARTHROPLASTY;  Surgeon: Rod Can, MD;  Location: [facility];  Service: Orthopedics;  Laterality: Right;  114mn   LEFT HEART CATH AND CORONARY ANGIOGRAPHY N/A 07/17/2017   Procedure: LEFT HEART CATH AND CORONARY ANGIOGRAPHY;  Surgeon: AWellington Hampshire MD;  [place];  Service: Cardiovascular;  Laterality: N/A;   left wrist surgery     SKIN GRAFT  1998   TONSILLECTOMY         Family History  Problem Relation Age of Onset   Stroke Father 730  Heart attack Father 64  CAD Mother    Prostate cancer  Brother    Thyroid nodules Brother     Social History   Tobacco Use   Smoking status: Former    Types: Cigarettes    Quit date: 09/24/1988    Years since quitting: 32.6   Smokeless tobacco: Never   Tobacco comments:    tobacco use - no  Vaping Use   Vaping Use: Never used  Substance Use Topics   Alcohol use: No   Drug use: No    Home Medications Prior to Admission medications   Medication Sig Start Date End Date Taking? Authorizing Provider  aspirin EC 81 MG tablet Take 1 tablet (81 mg total) by mouth daily. Swallow whole. 05/23/21   Arnoldo Lenis, MD  carvedilol (COREG) 3.125 MG tablet Take 3.125 mg by mouth 2 (two) times daily with a meal.    [provider]   citalopram (CELEXA) 40 MG tablet Take 40 mg by mouth daily.  06/17/17   [provider]  fish oil-omega-3 fatty acids 1000 MG capsule Take 1 g by mouth daily.    [provider]  HYDROcodone-acetaminophen (NORCO) 5-325 MG tablet Take 1 tablet by mouth every 4 (four) hours as needed for moderate pain. Patient not taking: Reported on 05/23/2021 11/02/20   Rod Can, MD  losartan-hydrochlorothiazide (HYZAAR) 100-12.5 MG tablet Take 1 tablet by mouth daily. 05/23/21   Arnoldo Lenis, MD  nitroGLYCERIN (NITROSTAT) 0.4 MG SL tablet Place 0.4 mg under the tongue every 5 (five) minutes as needed for chest pain.    [provider]  ondansetron (ZOFRAN) 4 MG tablet Take 1 tablet (4 mg total) by mouth every 8 (eight) hours as needed for nausea or vomiting. Patient not taking: Reported on 05/23/2021 11/02/20   Rod Can, MD  potassium chloride SA (K-DUR,KLOR-CON) 20 MEQ tablet Take 20 mEq by mouth daily.    [provider]  rosuvastatin (CRESTOR) 20 MG tablet Take 1 tablet (20 mg total) by mouth daily. 09/19/20   Ahmed Prima, Fransisco Hertz, PA-C    Allergies    Lipitor [atorvastatin]  Review of Systems   Review of Systems  Constitutional:  Negative for appetite change.  HENT:  Negative for congestion.   Respiratory:  Negative for cough and shortness of breath.   Cardiovascular:  Positive for chest pain.  Gastrointestinal:  Negative for abdominal pain and anal bleeding.  Genitourinary:  Negative for flank pain.  Musculoskeletal:  Negative for back pain.  Skin:  Negative for rash.  Neurological:  Negative for weakness.  Psychiatric/Behavioral:  Negative for confusion.    Physical Exam Updated Vital Signs BP (!) 133/92   Pulse (!) 48   Temp 97.7 F (36.5 C) (Temporal)   Resp 16   Ht '5\' 11"'$  (1.803 m)   Wt 106.1 kg   SpO2 97%   BMI 32.64 kg/m   Physical Exam Vitals and nursing note reviewed.  HENT:     Head: Atraumatic.  Cardiovascular:     Rate and  Rhythm: Normal rate and regular rhythm.  Pulmonary:     Breath sounds: No decreased breath sounds, wheezing, rhonchi or rales.  Chest:     Chest wall: No tenderness.  Abdominal:     Tenderness: There is no abdominal tenderness.  Musculoskeletal:     Right lower leg: No edema.     Left lower leg: No edema.  Skin:    General: Skin is warm.     Capillary Refill: Capillary refill takes less than 2 seconds.  Neurological:  Mental Status: He is alert and oriented to person, place, and time.    ED Results / Procedures / Treatments   Labs (all labs ordered are listed, but only abnormal results are displayed) Labs Reviewed  BASIC METABOLIC PANEL - Abnormal; Notable for the following components:      Result Value   Glucose, Bld 111 (*)    Creatinine, Ser 1.27 (*)    All other components within normal limits  CBC - Abnormal; Notable for the following components:   Hemoglobin 11.8 (*)    HCT 38.4 (*)    RDW 17.1 (*)    Platelets 126 (*)    All other components within normal limits  RESP PANEL BY RT-PCR (FLU A&B, COVID) ARPGX2  TROPONIN I (HIGH SENSITIVITY)  TROPONIN I (HIGH SENSITIVITY)    EKG EKG Interpretation  Date/Time:  Sunday May 28 2021 08:12:13 EDT Ventricular Rate:  65 PR Interval:  152 QRS Duration: 80 QT Interval:  396 QTC Calculation: 411 R Axis:   -15 Text Interpretation: Sinus rhythm with Premature atrial complexes with Abberant conduction Otherwise normal ECG nonspecific st changes Confirmed by Davonna Belling [phone]) on 05/28/2021 8:21:22 AM  Radiology DG Chest 2 View  Result Date: 05/28/2021 CLINICAL DATA:  Chest pain radiating down the arm EXAM: CHEST - 2 VIEW COMPARISON:  07/16/2017 FINDINGS: Normal heart size and mediastinal contours when allowing for apical fat pad. There is no edema, consolidation, effusion, or pneumothorax. Multilevel bridging thoracic osteophytes. IMPRESSION: No evidence of active disease. Electronically Signed   By: Monte Fantasia M.D.   On: 05/28/2021 09:11    Procedures Procedures   Medications Ordered in ED Medications  nitroGLYCERIN (NITROSTAT) SL tablet 0.4 mg (0.4 mg Sublingual Given 05/28/21 0908)    ED Course  I have reviewed the triage vital signs and the nursing notes.  Pertinent labs & imaging results that were available during my care of the patient were reviewed by me and considered in my medical decision making (see chart for details).    MDM Rules/Calculators/A&P                           Patient with chest pain.  Anterior chest with some radiation to the left arm.  Began today.  EKG overall reassuring but may have some mild nonspecific changes.  Troponin negative x2.  Pain-free after nitroglycerin now for third time.  Does have some chronic chest pain but states this is a different pain.  With previous stent I feel patient would benefit from admission to the hospital.  Will discuss with hospitalist for admission.  Discussed with hospitalist.  He believes that since cardiology does not appear this weekend I needed discussed with cardiology to figure out what what treatment needs to be done.  Discussed with Dr. Sallyanne Kuster from cardiology.  He will admit the [facility] to [facility] for   Final Clinical Impression(s) / ED Diagnoses Final diagnoses:  Chest pain, unspecified type    Rx / DC Orders ED Discharge Orders     None        Davonna Belling, MD 05/28/21 [place]    Davonna Belling, MD 05/28/21 1237

## 2021-05-28 NOTE — ED Notes (Signed)
Patient transported to X-ray 

## 2021-05-28 NOTE — H&P (Addendum)
Cardiology Admission History and Physical:   Patient ID: Stephen Graves MRN: [id number]; DOB: 03-01-1956   Admission date: 05/28/2021  PCP:  [place] [place] [facility] Cardiologist:  Carlyle Dolly, MD   {    Chief Complaint:  Chest pain  Patient Profile:   Stephen Graves is a 65 y.o. male with PMH of CAD s/p DES to mid-distal LAD in 06/2017, chronic chest pain, hypertension, hyperlipidemia, anxiety, obesity, who is being seen 05/28/2021 for the evaluation of pain.  History of Present Illness:   Stephen Graves is a former patient of Dr Bronson Ing. He is now seeing Dr Harl Bowie.   He underwent left heart catheterization on 07/17/2018 which revealed severe one-vessel CAD with 99% stenosis in mid to distal LAD, and was treated with DES.  Normal LV systolic function was noted at the time.  Echo from 07/17/2017 revealed EF 60 to 65%, grade indeterminate diastolic dysfunction.  He was evaluated by Dr. Harl Bowie last on 05/23/2021, reports to have starts in the back and radiates upward and improves with sodas.  He was otherwise doing well and chronically medically managed carvedilol, fish oil, losartan-hydrochlorothiazide, and Crestor.  Patient presented to the ER today complaining mid sternal chest pain that started around 0105 am.  He had taken 2 nitroglycerin which seemed to help the symptoms went to sleep, and took another 2 nitroglycerin at 4:30 AM due to recurrent chest pain, however pain was not relieved with nitro.  He also reports pain radiation to his back and left arm.    Admission diagnostic revealed elevated creatinine 1.27, otherwise unremarkable BMP.  High sensitive troponin 12 >15.  CBC with hemoglobin of 11.8, platelet 126k.  Flu and COVID-19 negative.  Chest x-ray revealed no acute finding.  Twelve-lead EKG showed sinus rhythm with occasional PVCs, ventricular rate of 65 bpm, no acute ischemic change.  He has remained hemodynamically stable at  ED.  He was given nitroglycerin ointment and tablets at ED.  Subsequently he is [facility] from [facility] to [facility] for admission.     Past Medical History:  Diagnosis Date   Anxiety    Coronary artery disease involving native coronary 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 ([place]) 07/18/2017   Cardiac cath 1020 1418: Mid LAD to Dist LAD lesion, 99 %stenosed. - PCI with resolute DES (2.5 X 26 mm Resolute Onyx stent - 2.75 mm);ostial LAD 30%. Ostial ramus 40%. Proximal to mid LAD 50%. Normal LV function and normal LVEDP.    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   Skull fracture [facility])     Past Surgical History:  Procedure Laterality Date   CORONARY STENT INTERVENTION N/A 07/17/2017   Procedure: CORONARY STENT INTERVENTION;  Surgeon: Wellington Hampshire, MD;  Location: [place] CV LAB;  Service: Cardiovascular;  Laterality: N/A;   FOOT SURGERY Left    KNEE ARTHROPLASTY Left 07/30/2018   Procedure: LEFT TOTAL KNEE ARTHROPLASTY WITH COMPUTER NAVIGATION;  Surgeon: Rod Can, MD;  [facility];  Service: Orthopedics;  Laterality: Left;  Needs RNFA   KNEE ARTHROPLASTY Right 11/02/2020   Procedure: COMPUTER ASSISTED TOTAL KNEE ARTHROPLASTY;  Surgeon: Rod Can, MD;  [facility];  Service: Orthopedics;  Laterality: Right;  148mn   LEFT HEART CATH AND CORONARY ANGIOGRAPHY N/A 07/17/2017   Procedure: LEFT HEART CATH AND CORONARY ANGIOGRAPHY;  Surgeon: AFletcher Anon  Mertie Clause, MD;  Location: [place] CV LAB;  Service: Cardiovascular;  Laterality: N/A;   left wrist surgery     SKIN GRAFT  1998   TONSILLECTOMY       Medications Prior to Admission: Prior to Admission medications   Medication Sig Start Date End Date Taking? Authorizing Provider  aspirin EC 81 MG tablet Take 1 tablet (81 mg total) by mouth daily. Swallow whole. 05/23/21  Yes Branch, Alphonse Guild, MD  carvedilol (COREG)  3.125 MG tablet Take 3.125 mg by mouth 2 (two) times daily with a meal.   Yes [provider]  Cholecalciferol (VITAMIN D3) 25 MCG (1000 UT) CAPS Take 1 capsule by mouth daily.   Yes [provider]  citalopram (CELEXA) 40 MG tablet Take 40 mg by mouth daily.  06/17/17  Yes [provider]  fish oil-omega-3 fatty acids 1000 MG capsule Take 1 g by mouth daily.   Yes [provider]  losartan-hydrochlorothiazide (HYZAAR) 100-12.5 MG tablet Take 1 tablet by mouth daily. 05/23/21  Yes Branch, Alphonse Guild, MD  nitroGLYCERIN (NITROSTAT) 0.4 MG SL tablet Place 0.4 mg under the tongue every 5 (five) minutes as needed for chest pain.   Yes [provider]  potassium chloride SA (K-DUR,KLOR-CON) 20 MEQ tablet Take 20 mEq by mouth daily.   Yes [provider]  rosuvastatin (CRESTOR) 20 MG tablet Take 1 tablet (20 mg total) by mouth daily. 09/19/20  Yes Strader, Fransisco Hertz, PA-C  HYDROcodone-acetaminophen (NORCO) 5-325 MG tablet Take 1 tablet by mouth every 4 (four) hours as needed for moderate pain. Patient not taking: No sig reported 11/02/20   Rod Can, MD  ondansetron (ZOFRAN) 4 MG tablet Take 1 tablet (4 mg total) by mouth every 8 (eight) hours as needed for nausea or vomiting. Patient not taking: No sig reported 11/02/20   Rod Can, MD     Allergies:    Allergies  Allergen Reactions   Lipitor [Atorvastatin]     Pain in neck and legs after taking medication    Social History:   Social History   Socioeconomic History   Marital status: Divorced    Spouse name: Not on file   Number of children: 0   Years of education: Not on file   Highest education level: Not on file  Occupational History   Not on file  Tobacco Use   Smoking status: Former    Types: Cigarettes    Quit date: 09/24/1988    Years since quitting: 32.6   Smokeless tobacco: Never   Tobacco comments:    tobacco use - no  Vaping Use   Vaping Use: Never used   Substance and Sexual Activity   Alcohol use: No   Drug use: No   Sexual activity: Not on file  Other Topics Concern   Not on file  Social History Narrative   Full time; single.    Social Determinants of Health   Financial Resource Strain: Not on file  Food Insecurity: Not on file  Transportation Needs: Not on file  Physical Activity: Not on file  Stress: Not on file  Social Connections: Not on file  Intimate Partner Violence: Not on file    Family History:   The patient's family history includes CAD in his mother; Heart attack (age of onset: 50) in his father; Prostate cancer in his brother; Stroke (age of onset: 22) in his father; Thyroid nodules in his brother.    ROS:   Constitutional: Denied  fever, chills, malaise, night sweats Eyes: Denied vision change or loss Ears/Nose/Mouth/Throat: Denied ear ache, sore throat, coughing, sinus pain Cardiovascular: see HPI  Respiratory: denied shortness of breath Gastrointestinal: Denied nausea, vomiting, abdominal pain, diarrhea Genital/Urinary: Denied dysuria, hematuria, urinary frequency/urgency Musculoskeletal: Denied muscle ache, joint pain, weakness Skin: Denied rash, wound Neuro: Denied headache, dizziness, syncope Psych: Denied history of depression/anxiety  Endocrine: Denied history of diabetes    Physical Exam/Data:   Vitals:   05/28/21 1330 05/28/21 1355 05/28/21 1400 05/28/21 1510  BP: 135/82  133/81 140/86  Pulse: 60  67 62  Resp: (!) 23  (!) 26 18  Temp:  98 F (36.7 C)  98.1 F (36.7 C)  TempSrc:  Oral  Oral  SpO2: 100%  100% 97%  Weight:      Height:       No intake or output data in the 24 hours ending 05/28/21 1522 Last 3 Weights 05/28/2021 05/23/2021 11/02/2020  Weight (lbs) 234 lb 234 lb 235 lb 14.3 oz  Weight (kg) 106.142 kg 106.142 kg 107 kg     Body mass index is 32.64 kg/m.   General:  Well nourished, well developed, in no acute distress ,obese HEENT: normal Lymph: no adenopathy Neck: no  JVD Endocrine:  No thryomegaly Vascular: No carotid bruits; FA pulses 2+ bilaterally without bruits  Cardiac:  normal S1, S2; RRR; no murmur  Lungs:  clear to auscultation bilaterally, no wheezing, rhonchi or rales  Abd: soft, nontender, no hepatomegaly  Ext: no edema Musculoskeletal:  No deformities, BUE and BLE strength normal and equal Skin: warm and dry  Neuro:  CNs 2-12 intact, no focal abnormalities noted Psych:  Normal affect    EKG:  The ECG showed SR with occasional PVCs, no acute ischemic changes   Relevant CV Studies:  Echo from 07/17/2017:  - Left ventricle: The cavity size was normal. Wall thickness was    increased in a pattern of mild LVH. Systolic function was normal.    The estimated ejection fraction was in the range of 60% to 65%.    Wall motion was normal; there were no regional wall motion    abnormalities. Diastolic dysfunction, grade indeterminate.  - Aortic valve: Moderately calcified annulus. Trileaflet.   LHC on 07/17/2017:  The left ventricular systolic function is normal. LV end diastolic pressure is normal. The left ventricular ejection fraction is 55-65% by visual estimate. Ost LAD lesion, 30 %stenosed. Ost Ramus to Ramus lesion, 40 %stenosed. Prox LAD to Mid LAD lesion, 50 %stenosed. A drug eluting stent was successfully placed. Mid LAD to Dist LAD lesion, 99 %stenosed. Post intervention, there is a 0% residual stenosis.   1.  Severe one-vessel coronary artery disease with 99% stenosis in the mid to distal LAD. 2.  Normal LV systolic function and normal left ventricular end-diastolic pressure. 3.  Successful angioplasty and drug-eluting stent placement to the mid/distal LAD.  A small septal branch was jailed by the stent with sluggish flow.  This resulted in chest pain and was treated with nitroglycerin drip.   Recommendations: Dual antiplatelet therapy for at least one year.  Aggressive treatment of risk factors.  Continue nitroglycerin drip  as needed for chest pain.   Laboratory Data:  High Sensitivity Troponin:   Recent Labs  Lab 05/28/21 0823 05/28/21 1011  TROPONINIHS 12 15      Chemistry Recent Labs  Lab 05/28/21 0823  NA 137  K 4.4  CL 102  CO2 27  GLUCOSE 111*  BUN 17  CREATININE 1.27*  CALCIUM 8.9  GFRNONAA >60  ANIONGAP 8    No results for input(s): PROT, ALBUMIN, AST, ALT, ALKPHOS, BILITOT in the last 168 hours. Hematology Recent Labs  Lab 05/28/21 0823  WBC 5.0  RBC 4.36  HGB 11.8*  HCT 38.4*  MCV 88.1  MCH 27.1  MCHC 30.7  RDW 17.1*  PLT 126*   BNPNo results for input(s): BNP, PROBNP in the last 168 hours.  DDimer No results for input(s): DDIMER in the last 168 hours.   Radiology/Studies:  DG Chest 2 View  Result Date: 05/28/2021 CLINICAL DATA:  Chest pain radiating down the arm EXAM: CHEST - 2 VIEW COMPARISON:  07/16/2017 FINDINGS: Normal heart size and mediastinal contours when allowing for apical fat pad. There is no edema, consolidation, effusion, or pneumothorax. Multilevel bridging thoracic osteophytes. IMPRESSION: No evidence of active disease. Electronically Signed   By: Monte Fantasia M.D.   On: 05/28/2021 09:11     Assessment and Plan:   Unstable angina  CAD with hx pf DES to LAD - presented with chest pain started 1AM 05/28/21 at rest, not going away with Nitro - Hs trop negative x2 - EKG without acute ischemic findings - CXR without acute findings - 2018 cath and Echo as above  - will start heparin gtt, resume ASA '81mg'$  daily, Coreg 3.'125mg'$  BID, losartan '100mg'$ , crestor '20mg'$  - will update lipid and A1C, CBC and BMP tomorrow  - will update Echo  - plan for cardiac cath on Tuesday 05/30/21, defer risk and benefit discussion to MD   HTN - BP mostly fairly controlled, will resume Coreg and Losartan, Hold HCTZ  HLD - update lipid panel - resume crestor '20mg'$  daily   Anxiety - resume celexa     Risk Assessment/Risk Scores:  HEAR Score (for undifferentiated chest  pain):  HEAR Score: 5       Severity of Illness: The appropriate patient status for this patient is OBSERVATION. Observation status is judged to be reasonable and necessary in order to provide the required intensity of service to ensure the patient's safety. The patient's presenting symptoms, physical exam findings, and initial radiographic and laboratory data in the context of their medical condition is felt to place them at decreased risk for further clinical deterioration. Furthermore, it is anticipated that the patient will be medically stable for discharge from the hospital within 2 midnights of admission. The following factors support the patient status of observation.   " The patient's presenting symptoms include chest pain. " The physical exam findings include N/A. " The initial radiographic and laboratory data are N/A.   For questions or updates, please contact [place] Please consult [email or web address] for contact info under     Signed, Margie Billet, NP  05/28/2021 3:22 PM    I have seen and examined the patient along with Margie Billet, NP .  I have reviewed the chart, notes and new data.  I agree with PA/NP's note.  Key new complaints: Clinical history is very strongly suggestive of unstable angina.  He has the same type of retrosternal pressure radiating to his back that he experienced when he had myocardial infarction in 2018.  He also experiences a milder form of the same discomfort if he walks "too far", but it does not happen with his usual activity.  It has been occurring with increasing frequency at rest.  Improved with sublingual nitroglycerin, but recurred.  Significant improvement with nitroglycerin patch. Key examination changes: Obese, normal  cardiovascular exam. Key new findings / data: ECG shows very subtle ST segment changes, nonspecific, and anterolateral leads.  Cardiac enzymes are normal.  Is 1.27 which is actually improved from values recorded earlier in the year.   Baseline GFR appears to be around 55 ml/min, c/w CKD 3a.He has minimal anemia with a hemoglobin of 11.8 and normocytic indices and borderline thrombocytopenia at 120 6K.  PLAN: Despite the absence of objective change in biomarkers, his symptoms are strongly suggestive of unstable angina pectoris with a rapidly accelerating pattern.  He has some very subtle changes in the ST segments in the lateral leads. At this point I am not sure intravenous heparin is indicated, but it should be started if he has recurrent angina pectoris.  Plan coronary angiography and revascularization as indicated.  Unfortunately, this will be performed on Tuesday due to the long weekend. This procedure has been fully reviewed with the patient and written informed consent has been obtained.   Sanda Klein, MD, [place] [phone] 05/28/2021, 3:47 PM

## 2021-05-28 NOTE — Plan of Care (Signed)
  Problem: Cardiac: Goal: Ability to achieve and maintain adequate cardiovascular perfusion will improve Outcome: Progressing   

## 2021-05-29 DIAGNOSIS — Z955 Presence of coronary angioplasty implant and graft: Secondary | ICD-10-CM

## 2021-05-29 DIAGNOSIS — F411 Generalized anxiety disorder: Secondary | ICD-10-CM

## 2021-05-29 DIAGNOSIS — N1831 Chronic kidney disease, stage 3a: Secondary | ICD-10-CM

## 2021-05-29 DIAGNOSIS — R072 Precordial pain: Secondary | ICD-10-CM

## 2021-05-29 DIAGNOSIS — I249 Acute ischemic heart disease, unspecified: Secondary | ICD-10-CM

## 2021-05-29 DIAGNOSIS — D649 Anemia, unspecified: Secondary | ICD-10-CM

## 2021-05-29 LAB — BASIC METABOLIC PANEL
Anion gap: 6 (ref 5–15)
BUN: 15 mg/dL (ref 8–23)
CO2: 27 mmol/L (ref 22–32)
Calcium: 9.1 mg/dL (ref 8.9–10.3)
Chloride: 105 mmol/L (ref 98–111)
Creatinine, Ser: 1.47 mg/dL — ABNORMAL HIGH (ref 0.61–1.24)
GFR, Estimated: 52 mL/min — ABNORMAL LOW (ref >60)
Glucose, Bld: 113 mg/dL — ABNORMAL HIGH (ref 70–99)
Potassium: 4.3 mmol/L (ref 3.5–5.1)
Sodium: 138 mmol/L (ref 135–145)

## 2021-05-29 LAB — CBC
HCT: 34.2 % — ABNORMAL LOW (ref 39.0–52.0)
Hemoglobin: 10.8 g/dL — ABNORMAL LOW (ref 13.0–17.0)
MCH: 27.5 pg (ref 26.0–34.0)
MCHC: 31.6 g/dL (ref 30.0–36.0)
MCV: 87 fL (ref 80.0–100.0)
Platelets: 99 10*3/uL — ABNORMAL LOW (ref 150–400)
RBC: 3.93 MIL/uL — ABNORMAL LOW (ref 4.22–5.81)
RDW: 17 % — ABNORMAL HIGH (ref 11.5–15.5)
WBC: 3.9 10*3/uL — ABNORMAL LOW (ref 4.0–10.5)
nRBC: 0 % (ref 0.0–0.2)

## 2021-05-29 LAB — LIPID PANEL
Cholesterol: 107 mg/dL (ref 0–200)
HDL: 42 mg/dL (ref >40)
LDL Cholesterol: 45 mg/dL (ref 0–99)
Total CHOL/HDL Ratio: 2.5 RATIO
Triglycerides: 102 mg/dL (ref <150)
VLDL: 20 mg/dL (ref 0–40)

## 2021-05-29 LAB — HEPARIN LEVEL (UNFRACTIONATED): Heparin Unfractionated: 0.8 IU/mL — ABNORMAL HIGH (ref 0.30–0.70)

## 2021-05-29 MED ORDER — ASPIRIN EC 81 MG PO TBEC
81.0000 mg | DELAYED_RELEASE_TABLET | Freq: Every day | ORAL | Status: DC
  Administered 2021-05-31: 81 mg via ORAL
  Filled 2021-05-29: qty 1

## 2021-05-29 MED ORDER — SODIUM CHLORIDE 0.9% FLUSH: 3.0000 mL | Freq: Two times a day (BID) | INTRAVENOUS | Status: DC

## 2021-05-29 MED ORDER — SODIUM CHLORIDE 0.9 % WEIGHT BASED INFUSION
3.0000 mL/kg/h | INTRAVENOUS | Status: DC
  Administered 2021-05-30: 3 mL/kg/h via INTRAVENOUS

## 2021-05-29 MED ORDER — NITROGLYCERIN 2 % TD OINT
1.0000 [in_us] | TOPICAL_OINTMENT | Freq: Three times a day (TID) | TRANSDERMAL | Status: DC
  Administered 2021-05-29: 1 [in_us] via TOPICAL
  Filled 2021-05-29: qty 30

## 2021-05-29 MED ORDER — NITROGLYCERIN IN D5W 200-5 MCG/ML-% IV SOLN
0.0000 ug/min | INTRAVENOUS | Status: DC
  Administered 2021-05-29: 5 ug/min via INTRAVENOUS
  Filled 2021-05-29: qty 250

## 2021-05-29 MED ORDER — SODIUM CHLORIDE 0.9 % IV SOLN: 250.0000 mL | INTRAVENOUS | Status: DC | PRN

## 2021-05-29 MED ORDER — SODIUM CHLORIDE 0.9% FLUSH
3.0000 mL | Freq: Two times a day (BID) | INTRAVENOUS | Status: DC
  Administered 2021-05-29: 3 mL via INTRAVENOUS

## 2021-05-29 MED ORDER — ASPIRIN 81 MG PO CHEW
81.0000 mg | CHEWABLE_TABLET | ORAL | Status: AC
  Administered 2021-05-30: 81 mg via ORAL
  Filled 2021-05-29: qty 1

## 2021-05-29 MED ORDER — CARVEDILOL 6.25 MG PO TABS
6.2500 mg | ORAL_TABLET | Freq: Two times a day (BID) | ORAL | Status: DC
  Administered 2021-05-29 – 2021-05-30 (×2): 6.25 mg via ORAL
  Filled 2021-05-29 (×2): qty 1

## 2021-05-29 MED ORDER — ASPIRIN 81 MG PO CHEW: 81.0000 mg | CHEWABLE_TABLET | ORAL | Status: DC

## 2021-05-29 MED ORDER — CITALOPRAM HYDROBROMIDE 20 MG PO TABS
20.0000 mg | ORAL_TABLET | Freq: Every day | ORAL | Status: DC
  Administered 2021-05-30 – 2021-05-31 (×2): 20 mg via ORAL
  Filled 2021-05-29 (×2): qty 1

## 2021-05-29 MED ORDER — SODIUM CHLORIDE 0.9% FLUSH: 3.0000 mL | INTRAVENOUS | Status: DC | PRN

## 2021-05-29 MED ORDER — SODIUM CHLORIDE 0.9 % WEIGHT BASED INFUSION: 1.0000 mL/kg/h | INTRAVENOUS | Status: DC

## 2021-05-29 MED ORDER — CARVEDILOL 3.125 MG PO TABS
3.1250 mg | ORAL_TABLET | Freq: Once | ORAL | Status: AC
  Administered 2021-05-29: 3.125 mg via ORAL
  Filled 2021-05-29: qty 1

## 2021-05-29 MED ORDER — HEPARIN BOLUS VIA INFUSION
4000.0000 [IU] | Freq: Once | INTRAVENOUS | Status: AC
  Administered 2021-05-29: 4000 [IU] via INTRAVENOUS
  Filled 2021-05-29: qty 4000

## 2021-05-29 MED ORDER — SODIUM CHLORIDE 0.9 % WEIGHT BASED INFUSION: 3.0000 mL/kg/h | INTRAVENOUS | Status: DC

## 2021-05-29 MED ORDER — SODIUM CHLORIDE 0.9 % WEIGHT BASED INFUSION
1.0000 mL/kg/h | INTRAVENOUS | Status: DC
  Administered 2021-05-30: 1 mL/kg/h via INTRAVENOUS

## 2021-05-29 MED ORDER — HEPARIN (PORCINE) 25000 UT/250ML-% IV SOLN
1250.0000 [IU]/h | INTRAVENOUS | Status: DC
  Administered 2021-05-29: 1400 [IU]/h via INTRAVENOUS
  Administered 2021-05-30: 1250 [IU]/h via INTRAVENOUS
  Filled 2021-05-29 (×2): qty 250

## 2021-05-29 MED ORDER — ASPIRIN EC 81 MG PO TBEC
81.0000 mg | DELAYED_RELEASE_TABLET | Freq: Every day | ORAL | Status: AC
  Administered 2021-05-29: 81 mg via ORAL
  Filled 2021-05-29: qty 1

## 2021-05-29 NOTE — Progress Notes (Signed)
ANTICOAGULATION CONSULT NOTE - Follow up [place] for Heparin Indication: chest pain/ACS  Allergies  Allergen Reactions   Lipitor [Atorvastatin]     Pain in neck and legs after taking medication    Patient Measurements: Height: 6' (182.9 cm) Weight: 108 kg (238 lb 1.6 oz) IBW/kg (Calculated) : 77.6 Heparin Dosing Weight: 100.3 kg  Vital Signs: Temp: 99.2 F (37.3 C) (09/05 1959) Temp Source: Oral (09/05 1959) BP: 103/70 (09/05 1959) Pulse Rate: 64 (09/05 1959)  Labs: Recent Labs    05/28/21 0823 05/28/21 1011 05/28/21 1535 05/29/21 0059 05/29/21 2122  HGB 11.8*  --  11.2* 10.8*  --   HCT 38.4*  --  35.9* 34.2*  --   PLT 126*  --  124* 99*  --   HEPARINUNFRC  --   --   --   --  0.80*  CREATININE 1.27*  --  1.24 1.47*  --   TROPONINIHS 12 15  --   --   --      Estimated Creatinine Clearance: 61.9 mL/min (A) (by C-G formula based on SCr of 1.47 mg/dL (H)).   Medical History: Past Medical History:  Diagnosis Date   Anxiety    Coronary artery disease involving native coronary 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 ([place]) 07/18/2017   Cardiac cath 1020 1418: Mid LAD to Dist LAD lesion, 99 %stenosed. - PCI with resolute DES (2.5 X 26 mm Resolute Onyx stent - 2.75 mm);ostial LAD 30%. Ostial ramus 40%. Proximal to mid LAD 50%. Normal LV function and normal LVEDP.    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   Skull fracture (HCC)     Medications:  Scheduled:   [START ON 05/30/2021] aspirin  81 mg Oral Pre-Cath   [START ON 05/31/2021] aspirin EC  81 mg Oral Daily   carvedilol  6.25 mg Oral BID WC   [START ON 05/30/2021] citalopram  20 mg Oral Daily   losartan  100 mg Oral Daily   omega-3 acid ethyl esters  1 g Oral Daily   rosuvastatin  20 mg Oral Daily   sodium chloride flush  3 mL Intravenous Q12H   Infusions:   sodium chloride     [START ON 05/30/2021] sodium chloride      Followed by   Derrill Memo ON 05/30/2021] sodium chloride     heparin 1,400 Units/hr (05/29/21 1500)   nitroGLYCERIN 30 mcg/min (05/29/21 1554)    Assessment: 65 yo male presenting with recurrent chest pain, hx of CAD s/p DES to mid-distal LAD (06/2017).  Patient was not on anticoagulation PTA.    Patient continues to have chest pain despite multiple doses of nitroglycerin.  Pharmacy consulted for heparin dosing.  No signs/symptoms of bleeding noted.  Heparin drip 1400 uts/hr heparin level supra-therapeutic 0.8 - will decrease   Goal of Therapy:  Heparin level 0.3-0.7 units/ml Monitor platelets by anticoagulation protocol: Yes   Plan:  Decrease heparin drip 1250 uts/hr  Monitor CBC and for signs/symptoms of bleeding Daily CBC and heparin level     Bonnita Nasuti Pharm.D. CPP, BCPS Clinical Pharmacist [phone] 05/29/2021 10:21 PM    Please check AMION for all [place] phone numbers After 10:00 PM, call [place] [phone]

## 2021-05-29 NOTE — Progress Notes (Addendum)
ANTICOAGULATION CONSULT NOTE - Initial Consult  Pharmacy Consult for Heparin Indication: chest pain/ACS  Allergies  Allergen Reactions   Lipitor [Atorvastatin]     Pain in neck and legs after taking medication    Patient Measurements: Height: 6' (182.9 cm) Weight: 108 kg (238 lb 1.6 oz) IBW/kg (Calculated) : 77.6 Heparin Dosing Weight: 100.3 kg  Vital Signs: Temp: 98.2 F (36.8 C) (09/05 1351) Temp Source: Oral (09/05 1351) BP: 153/96 (09/05 1351) Pulse Rate: 66 (09/05 1351)  Labs: Recent Labs    05/28/21 0823 05/28/21 1011 05/28/21 1535 05/29/21 0059  HGB 11.8*  --  11.2* 10.8*  HCT 38.4*  --  35.9* 34.2*  PLT 126*  --  124* 99*  CREATININE 1.27*  --  1.24 1.47*  TROPONINIHS 12 15  --   --     Estimated Creatinine Clearance: 61.9 mL/min (A) (by C-G formula based on SCr of 1.47 mg/dL (H)).   Medical History: Past Medical History:  Diagnosis Date   Anxiety    Coronary artery disease involving native coronary 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 ([place]) 07/18/2017   Cardiac cath 1020 1418: Mid LAD to Dist LAD lesion, 99 %stenosed. - PCI with resolute DES (2.5 X 26 mm Resolute Onyx stent - 2.75 mm);ostial LAD 30%. Ostial ramus 40%. Proximal to mid LAD 50%. Normal LV function and normal LVEDP.    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   Skull fracture (HCC)     Medications:  Scheduled:   [START ON 05/30/2021] aspirin  81 mg Oral Pre-Cath   [START ON 05/31/2021] aspirin EC  81 mg Oral Daily   carvedilol  6.25 mg Oral BID WC   citalopram  40 mg Oral Daily   heparin  5,000 Units Subcutaneous Q8H   losartan  100 mg Oral Daily   omega-3 acid ethyl esters  1 g Oral Daily   rosuvastatin  20 mg Oral Daily   sodium chloride flush  3 mL Intravenous Q12H   sodium chloride flush  3 mL Intravenous Q12H   Infusions:   sodium chloride     [START ON 05/30/2021] sodium chloride     Followed by    Derrill Memo ON 05/30/2021] sodium chloride     nitroGLYCERIN      Assessment: 65 yo male presenting with recurrent chest pain, hx of CAD s/p DES to mid-distal LAD (06/2017).  Patient was not on anticoagulation PTA.    Patient continues to have chest pain despite multiple doses of nitroglycerin.  Pharmacy consulted for heparin dosing.  No signs/symptoms of bleeding noted.   Goal of Therapy:  Heparin level 0.3-0.7 units/ml Monitor platelets by anticoagulation protocol: Yes   Plan:  Give heparin 4000 units bolus x 1 Start heparin infusion at 1400 units/hr Check ~6hr heparin level Monitor CBC and for signs/symptoms of bleeding   Vance Peper, PharmD PGY1 Pharmacy Resident Phone [phone] 05/29/2021 2:12 PM   Please check AMION for all [place] phone numbers After 10:00 PM, call [place] [phone]

## 2021-05-29 NOTE — Progress Notes (Signed)
Patient continued to CO chest pain mid chest and to his back. Paged Roby Lofts and started patient on IV nitro and Iv heparin per orders.

## 2021-05-29 NOTE — Progress Notes (Signed)
Patient c/o CP 6/10.  BP 170/97.  NTG 0.4 mg SL given.  EKG obtained.  CP improved to 4/10, and patient declined another SL NTG.  BP improved to 140/98.  Patient assisted to bathroom without difficulty.  States his CP is 6/10 with deep inspiration, but still declines NTG.  Dr. Marcelle Smiling notified of the above.  Will continue to monitor.  Stephen Graves

## 2021-05-29 NOTE — Progress Notes (Signed)
   Patient continued to have some chest discomfort despite addition of nitro patch this morning. Will initiate heparin gtt and nitro gtt per Dr. Allison Quarry recommendation.   Abigail Butts, PA-C 05/29/21; 2:06 PM

## 2021-05-29 NOTE — Progress Notes (Signed)
Progress Note  Patient Name: Stephen Graves Date of Encounter: 05/29/2021  [facility] Cardiologist: Carlyle Dolly, MD    Patient Profile     65 y.o. obese gentleman with PMH of single-vessel CAD-s/p DES PCI to mid and distal LAD 06/2017 with chronic chest pain, HTN, HLD and anxiety as well seen by Dr. Sallyanne Kuster in the ER for recurrent chest pain with symptoms consistent with unstable angina.  Several episodes of substernal chest pain relieved with some relief with nitroglycerin.  Radiated to the back and left arm.  Transferred from [facility] to [facility].  Troponin levels negative, EKG with subtle changes.    Assessment & Plan    Principal Problem:   Acute coronary syndrome [facility]) Active Problems:   Coronary artery disease involving native coronary 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 (HCC)   Presence of drug coated stent in LAD coronary artery   Essential hypertension   Hyperlipidemia with target LDL less than 70   Chest pain  Principal Problem:   Acute coronary syndrome (HCC) - Chest pain; Coronary artery disease involving native coronary 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 (HCC) Presence of drug coated stent in LAD coronary artery  Echo shows preserved EF however the atrial sizes are both enlarged.  Otherwise stable.  No new wall motion abnormalities. Plan on admission was to hold off on IV heparin unless he has recurrent pain on nitroglycerin patch - > will discuss potential of switching from nitroglycerin patch to IV nitroglycerin..  (Was actually not on nitroglycerin patch last night when he had chest pain-we will replace today) Plan for cardiac catheterization 05/30/2021 On aspirin-not on antiplatelet agent-was discontinued after 1 year. On rosuvastatin 20 mg and Lovaza. On high-dose losartan along with low-dose carvedilol -> anticipate being able to titrate up carvedilol.  Active Problems:       Essential hypertension  -pressures tend to be running up.  Will increase carvedilol dose to 625 mg twice daily today.>     Hyperlipidemia with target LDL less than 70: Lipids this morning showed LDL 45 on current dose of rosuvastatin and Lovaza.  No change.  Creatinine 1.47 up from 1.24.  Baseline is roughly 1.2-1.5.  Stable.  Prehydrated for cath.   Baseline anxiety: Continue with home dose Celexa. Mild anemia hemoglobin down from 11.8-10.8.  Monitor closely.  We will hopefully be able to avoid IV heparin overnight. A1c 6.4. -Does not have antecedent diagnosis of diabetes, may benefit from initiation of therapy on discharge.  Likely consider metformin plus or minus SGLT2 inhibitor.  Follow-up morning labs   Shared Decision Making/Informed Consent The risks [stroke (1 in 1000), death (1 in 1000), kidney failure [usually temporary] (1 in 500), bleeding (1 in 200), allergic reaction [possibly serious] (1 in 200)], benefits (diagnostic support and management of coronary artery disease) and alternatives of a cardiac catheterization were discussed in detail with Stephen Graves and he is willing to proceed.   ----------------------------------------------------------------------------------------------------------------------  Subjective   Had chest pain overnight some relief from nitroglycerin, but declined additional nitro.  Inpatient Medications    Scheduled Meds:  [START ON 05/30/2021] aspirin  81 mg Oral Pre-Cath   [START ON 05/31/2021] aspirin EC  81 mg Oral Daily   carvedilol  3.125 mg Oral BID WC   citalopram  40 mg Oral Daily   heparin  5,000 Units Subcutaneous Q8H   losartan  100 mg Oral Daily   omega-3 acid ethyl esters  1 g  Oral Daily   rosuvastatin  20 mg Oral Daily   sodium chloride flush  3 mL Intravenous Q12H   Continuous Infusions:  sodium chloride     [START ON 05/30/2021] sodium chloride     Followed by   Derrill Memo ON 05/30/2021] sodium chloride     PRN Meds: sodium chloride, acetaminophen,  nitroGLYCERIN, ondansetron (ZOFRAN) IV, sodium chloride flush   Vital Signs    Vitals:   05/29/21 0033 05/29/21 0131 05/29/21 0138 05/29/21 0858  BP: (!) 141/94 (!) 170/97 (!) 140/98 (!) 178/114  Pulse: 60   72  Resp: 18 18    Temp:      TempSrc:      SpO2: 100%     Weight:      Height:       No intake or output data in the 24 hours ending 05/29/21 1004 Last 3 Weights 05/28/2021 05/28/2021 05/23/2021  Weight (lbs) 238 lb 1.6 oz 234 lb 234 lb  Weight (kg) 108 kg 106.142 kg 106.142 kg      Telemetry    Mostly sinus rhythm, rates mostly in the 60s as high as mid 70s.  Intermittent PVCs and frequent PACs with sinus arrhythmia.- Personally Reviewed  ECG    Heart rate 61 bpm.  Sinus rhythm with sinus arrhythmia.  Nonspecific ST and T wave changes in anterior leads.- Personally Reviewed  Physical Exam   GEN: No acute distress.  Resting comfortably. Neck: No JVD or bruit. Cardiac: RRR with frequent ectopy, no murmurs, rubs, or gallops.  Respiratory: Clear to auscultation bilaterally.  Nonlabored, good air movement. GI: Soft, nontender, non-distended  MS: No edema; No deformity. Neuro:  Nonfocal-CN II through XII grossly intact. Psych: Normal affect; very anxious.  Seems very worried  Labs    High Sensitivity Troponin:   Recent Labs  Lab 05/28/21 0823 05/28/21 1011  TROPONINIHS 12 15      Chemistry Recent Labs  Lab 05/28/21 0823 05/28/21 1535 05/29/21 0059  NA 137  --  138  K 4.4  --  4.3  CL 102  --  105  CO2 27  --  27  GLUCOSE 111*  --  113*  BUN 17  --  15  CREATININE 1.27* 1.24 1.47*  CALCIUM 8.9  --  9.1  GFRNONAA >60 >60 52*  ANIONGAP 8  --  6     Hematology Recent Labs  Lab 05/28/21 0823 05/28/21 1535 05/29/21 0059  WBC 5.0 3.7* 3.9*  RBC 4.36 4.11* 3.93*  HGB 11.8* 11.2* 10.8*  HCT 38.4* 35.9* 34.2*  MCV 88.1 87.3 87.0  MCH 27.1 27.3 27.5  MCHC 30.7 31.2 31.6  RDW 17.1* 17.2* 17.0*  PLT 126* 124* 99*    BNPNo results for input(s): BNP,  PROBNP in the last 168 hours.   DDimer No results for input(s): DDIMER in the last 168 hours.   Cardiac Studies Summarized   TTE 05/28/2021: EF 60 to 65%.  No R WMA.  Moderate concentric LVH unable to assess diastolic parameters.  Severe LA and RA dilation.  Normal RVP/RAP. ---------------------------------------------------------------------------- TTE 07/17/2017: EF 60 to 65%.  Mild LVH.  Indeterminate diastolic function.  No RWM A.  Normal atrial sizes. Cardiac Cath-PCI 07/17/2017: Ostial LAD 30%, proximal-mid LAD 50%.  Mid and distal LAD 99% (DES PCI 2.5 x 26 Resolute Onyx-2.8 mm).  Ostial RI 40%.  EF 55 to 65%.  Normal EDP.  Radiology    DG Chest 2 View  Result Date: 05/28/2021 CLINICAL DATA:  Chest pain radiating down the arm EXAM: CHEST - 2 VIEW COMPARISON:  07/16/2017 FINDINGS: Normal heart size and mediastinal contours when allowing for apical fat pad. There is no edema, consolidation, effusion, or pneumothorax. Multilevel bridging thoracic osteophytes. IMPRESSION: No evidence of active disease. Electronically Signed   By: Monte Fantasia M.D.   On: 05/28/2021 09:11   For questions or updates, please contact [place] Please consult [email or web address] for contact info under        Signed, Glenetta Hew, MD  05/29/2021, 10:04 AM

## 2021-05-30 ENCOUNTER — Other Ambulatory Visit (HOSPITAL_COMMUNITY): Payer: Self-pay

## 2021-05-30 ENCOUNTER — Encounter (HOSPITAL_COMMUNITY)
Admission: EM | Disposition: A | Discharge status: Home / Self Care | Payer: Self-pay | Source: Home / Self Care | Attending: Cardiovascular Disease

## 2021-05-30 ENCOUNTER — Encounter (HOSPITAL_COMMUNITY): Payer: Self-pay | Admitting: Cardiovascular Disease

## 2021-05-30 HISTORY — PX: CORONARY STENT INTERVENTION: CATH118234

## 2021-05-30 HISTORY — PX: LEFT HEART CATH AND CORONARY ANGIOGRAPHY: CATH118249

## 2021-05-30 LAB — CBC
HCT: 32.7 % — ABNORMAL LOW (ref 39.0–52.0)
Hemoglobin: 10.4 g/dL — ABNORMAL LOW (ref 13.0–17.0)
MCH: 27.4 pg (ref 26.0–34.0)
MCHC: 31.8 g/dL (ref 30.0–36.0)
MCV: 86.3 fL (ref 80.0–100.0)
Platelets: 95 10*3/uL — ABNORMAL LOW (ref 150–400)
RBC: 3.79 MIL/uL — ABNORMAL LOW (ref 4.22–5.81)
RDW: 17.2 % — ABNORMAL HIGH (ref 11.5–15.5)
WBC: 5.5 10*3/uL (ref 4.0–10.5)
nRBC: 0 % (ref 0.0–0.2)

## 2021-05-30 LAB — BASIC METABOLIC PANEL
Anion gap: 4 — ABNORMAL LOW (ref 5–15)
BUN: 16 mg/dL (ref 8–23)
CO2: 27 mmol/L (ref 22–32)
Calcium: 8.6 mg/dL — ABNORMAL LOW (ref 8.9–10.3)
Chloride: 104 mmol/L (ref 98–111)
Creatinine, Ser: 1.43 mg/dL — ABNORMAL HIGH (ref 0.61–1.24)
GFR, Estimated: 54 mL/min — ABNORMAL LOW (ref >60)
Glucose, Bld: 122 mg/dL — ABNORMAL HIGH (ref 70–99)
Potassium: 3.9 mmol/L (ref 3.5–5.1)
Sodium: 135 mmol/L (ref 135–145)

## 2021-05-30 LAB — POCT ACTIVATED CLOTTING TIME
Activated Clotting Time: 254 seconds
Activated Clotting Time: 271 seconds

## 2021-05-30 LAB — HEPARIN LEVEL (UNFRACTIONATED): Heparin Unfractionated: 0.68 IU/mL (ref 0.30–0.70)

## 2021-05-30 SURGERY — LEFT HEART CATH AND CORONARY ANGIOGRAPHY
Anesthesia: LOCAL

## 2021-05-30 MED ORDER — FENTANYL CITRATE (PF) 100 MCG/2ML IJ SOLN
INTRAMUSCULAR | Status: DC | PRN
  Administered 2021-05-30: 25 ug via INTRAVENOUS

## 2021-05-30 MED ORDER — FENTANYL CITRATE (PF) 100 MCG/2ML IJ SOLN
INTRAMUSCULAR | Status: AC
  Filled 2021-05-30: qty 2

## 2021-05-30 MED ORDER — MIDAZOLAM HCL 2 MG/2ML IJ SOLN
INTRAMUSCULAR | Status: AC
  Filled 2021-05-30: qty 2

## 2021-05-30 MED ORDER — IOHEXOL 350 MG/ML SOLN
INTRAVENOUS | Status: DC | PRN
  Administered 2021-05-30: 160 mL

## 2021-05-30 MED ORDER — CARVEDILOL 6.25 MG PO TABS
6.2500 mg | ORAL_TABLET | Freq: Two times a day (BID) | ORAL | Status: DC
  Administered 2021-05-30 – 2021-05-31 (×2): 6.25 mg via ORAL
  Filled 2021-05-30 (×2): qty 1

## 2021-05-30 MED ORDER — TICAGRELOR 90 MG PO TABS
ORAL_TABLET | ORAL | Status: AC
  Filled 2021-05-30: qty 2

## 2021-05-30 MED ORDER — ONDANSETRON HCL 4 MG/2ML IJ SOLN: 4.0000 mg | Freq: Four times a day (QID) | INTRAMUSCULAR | Status: DC | PRN

## 2021-05-30 MED ORDER — SODIUM CHLORIDE 0.9% FLUSH: 3.0000 mL | Freq: Two times a day (BID) | INTRAVENOUS | Status: DC

## 2021-05-30 MED ORDER — NITROGLYCERIN 1 MG/10 ML FOR IR/CATH LAB
INTRA_ARTERIAL | Status: DC | PRN
  Administered 2021-05-30: 200 ug via INTRACORONARY

## 2021-05-30 MED ORDER — LIDOCAINE HCL (PF) 1 % IJ SOLN
INTRAMUSCULAR | Status: DC | PRN
  Administered 2021-05-30: 2 mL

## 2021-05-30 MED ORDER — HEPARIN SODIUM (PORCINE) 1000 UNIT/ML IJ SOLN
INTRAMUSCULAR | Status: DC | PRN
  Administered 2021-05-30 (×2): 5000 [IU] via INTRAVENOUS
  Administered 2021-05-30: 3000 [IU] via INTRAVENOUS

## 2021-05-30 MED ORDER — HEPARIN (PORCINE) IN NACL 1000-0.9 UT/500ML-% IV SOLN
INTRAVENOUS | Status: DC | PRN
  Administered 2021-05-30 (×2): 500 mL

## 2021-05-30 MED ORDER — NITROGLYCERIN 1 MG/10 ML FOR IR/CATH LAB
INTRA_ARTERIAL | Status: AC
  Filled 2021-05-30: qty 10

## 2021-05-30 MED ORDER — SODIUM CHLORIDE 0.9 % IV SOLN: INTRAVENOUS | Status: AC

## 2021-05-30 MED ORDER — SODIUM CHLORIDE 0.9 % IV SOLN: 250.0000 mL | INTRAVENOUS | Status: DC | PRN

## 2021-05-30 MED ORDER — HEPARIN (PORCINE) IN NACL 1000-0.9 UT/500ML-% IV SOLN
INTRAVENOUS | Status: AC
  Filled 2021-05-30: qty 1000

## 2021-05-30 MED ORDER — TICAGRELOR 90 MG PO TABS
90.0000 mg | ORAL_TABLET | Freq: Two times a day (BID) | ORAL | Status: DC
  Administered 2021-05-30 – 2021-05-31 (×2): 90 mg via ORAL
  Filled 2021-05-30 (×2): qty 1

## 2021-05-30 MED ORDER — HEPARIN SODIUM (PORCINE) 1000 UNIT/ML IJ SOLN
INTRAMUSCULAR | Status: AC
  Filled 2021-05-30: qty 1

## 2021-05-30 MED ORDER — SODIUM CHLORIDE 0.9% FLUSH: 3.0000 mL | INTRAVENOUS | Status: DC | PRN

## 2021-05-30 MED ORDER — TICAGRELOR 90 MG PO TABS
ORAL_TABLET | ORAL | Status: DC | PRN
  Administered 2021-05-30: 180 mg via ORAL

## 2021-05-30 MED ORDER — MIDAZOLAM HCL 2 MG/2ML IJ SOLN
INTRAMUSCULAR | Status: DC | PRN
  Administered 2021-05-30: 2 mg via INTRAVENOUS

## 2021-05-30 MED ORDER — LABETALOL HCL 5 MG/ML IV SOLN: 10.0000 mg | INTRAVENOUS | Status: AC | PRN

## 2021-05-30 MED ORDER — VERAPAMIL HCL 2.5 MG/ML IV SOLN
INTRAVENOUS | Status: DC | PRN
  Administered 2021-05-30: 10 mL via INTRA_ARTERIAL

## 2021-05-30 MED ORDER — LIDOCAINE HCL (PF) 1 % IJ SOLN
INTRAMUSCULAR | Status: AC
  Filled 2021-05-30: qty 30

## 2021-05-30 MED ORDER — NITROGLYCERIN IN D5W 200-5 MCG/ML-% IV SOLN: 0.0000 ug/min | INTRAVENOUS | Status: DC

## 2021-05-30 MED ORDER — HYDRALAZINE HCL 20 MG/ML IJ SOLN: 10.0000 mg | INTRAMUSCULAR | Status: AC | PRN

## 2021-05-30 MED ORDER — VERAPAMIL HCL 2.5 MG/ML IV SOLN
INTRAVENOUS | Status: AC
  Filled 2021-05-30: qty 2

## 2021-05-30 SURGICAL SUPPLY — 20 items
BALLN SAPPHIRE 2.25X20 (BALLOONS) ×2
BALLN SAPPHIRE ~~LOC~~ 2.75X18 (BALLOONS) ×2 IMPLANT
BALLOON SAPPHIRE 2.25X20 (BALLOONS) ×1 IMPLANT
CATH INFINITI 5FR ANG PIGTAIL (CATHETERS) ×2 IMPLANT
CATH OPTITORQUE TIG 4.0 5F (CATHETERS) ×2 IMPLANT
CATH VISTA GUIDE 6FR XBLAD3.5 (CATHETERS) ×2 IMPLANT
DEVICE RAD COMP TR BAND LRG (VASCULAR PRODUCTS) ×2 IMPLANT
GLIDESHEATH SLEND SS 6F .021 (SHEATH) ×2 IMPLANT
GUIDEWIRE INQWIRE 1.5J.035X260 (WIRE) ×1 IMPLANT
INQWIRE 1.5J .035X260CM (WIRE) ×2
KIT ENCORE 26 ADVANTAGE (KITS) ×2 IMPLANT
KIT HEART LEFT (KITS) ×2 IMPLANT
PACK CARDIAC CATHETERIZATION (CUSTOM PROCEDURE TRAY) ×2 IMPLANT
SHEATH PROBE COVER 6X72 (BAG) ×2 IMPLANT
STENT SYNERGY XD 2.50X48 (Permanent Stent) ×1 IMPLANT
SYNERGY XD 2.50X48 (Permanent Stent) ×2 IMPLANT
SYR MEDRAD MARK 7 150ML (SYRINGE) ×2 IMPLANT
TRANSDUCER W/STOPCOCK (MISCELLANEOUS) ×2 IMPLANT
TUBING CIL FLEX 10 FLL-RA (TUBING) ×2 IMPLANT
WIRE RUNTHROUGH .014X180CM (WIRE) ×2 IMPLANT

## 2021-05-30 NOTE — Care Management (Signed)
[id number] 05-30-21 Benefits check submitted for [employer]. Case Manager will follow for cost.

## 2021-05-30 NOTE — Progress Notes (Signed)
ANTICOAGULATION CONSULT NOTE   Pharmacy Consult for Heparin Indication: chest pain/ACS  Allergies  Allergen Reactions   Lipitor [Atorvastatin]     Pain in neck and legs after taking medication    Patient Measurements: Height: 6' (182.9 cm) Weight: 106.6 kg (235 lb) IBW/kg (Calculated) : 77.6 Heparin Dosing Weight: 100.3 kg  Vital Signs: Temp: 98 F (36.7 C) (09/06 0745) Temp Source: Oral (09/06 0745) BP: 152/96 (09/06 0906) Pulse Rate: 68 (09/06 0906)  Labs: Recent Labs    05/28/21 0823 05/28/21 1011 05/28/21 1535 05/29/21 0059 05/29/21 2122 05/30/21 0146  HGB 11.8*  --  11.2* 10.8*  --  10.4*  HCT 38.4*  --  35.9* 34.2*  --  32.7*  PLT 126*  --  124* 99*  --  95*  HEPARINUNFRC  --   --   --   --  0.80* 0.68  CREATININE 1.27*  --  1.24 1.47*  --  1.43*  TROPONINIHS 12 15  --   --   --   --      Estimated Creatinine Clearance: 63.2 mL/min (A) (by C-G formula based on SCr of 1.43 mg/dL (H)).   Medical History: Past Medical History:  Diagnosis Date   Anxiety    Coronary artery disease involving native coronary 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 ([place]) 07/18/2017   Cardiac cath 1020 1418: Mid LAD to Dist LAD lesion, 99 %stenosed. - PCI with resolute DES (2.5 X 26 mm Resolute Onyx stent - 2.75 mm);ostial LAD 30%. Ostial ramus 40%. Proximal to mid LAD 50%. Normal LV function and normal LVEDP.    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  Patient was not on anticoagulation PTA.  Plans are for cardiac cath -heparin level at goal, CBC stable (history of thrombocytopenia noted)   Goal of Therapy:  Heparin level 0.3-0.7 units/ml Monitor platelets by anticoagulation protocol: Yes   Plan:  -Continue heparin at 1250 units/hr -Will follow plans post cath  Hildred Laser, PharmD Clinical Pharmacist **Pharmacist phone directory can now be found on [email or web address] ([email or web address]).  Listed under [place].

## 2021-05-30 NOTE — Progress Notes (Signed)
TR BAND REMOVAL  LOCATION:    right radial  DEFLATED PER PROTOCOL:    Yes.    TIME BAND OFF / DRESSING APPLIED:    1918   SITE UPON ARRIVAL:    Level 0  SITE AFTER BAND REMOVAL:    Level 0  CIRCULATION SENSATION AND MOVEMENT:    Within Normal Limits   Yes.    COMMENTS:   Patient tolerated well, instructions given.

## 2021-05-30 NOTE — H&P (View-Only) (Signed)
Progress Note  Patient Name: Stephen Graves Date of Encounter: 05/30/2021  [facility] Cardiologist: Carlyle Dolly, MD   Subjective   No further episodes of chest pain this morning. Remains on IV nitro.   Inpatient Medications    Scheduled Meds:  [START ON 05/31/2021] aspirin EC  81 mg Oral Daily   carvedilol  6.25 mg Oral BID WC   citalopram  20 mg Oral Daily   losartan  100 mg Oral Daily   omega-3 acid ethyl esters  1 g Oral Daily   rosuvastatin  20 mg Oral Daily   sodium chloride flush  3 mL Intravenous Q12H   Continuous Infusions:  sodium chloride     sodium chloride 1 mL/kg/hr (05/30/21 0634)   heparin 1,250 Units/hr (05/30/21 0019)   nitroGLYCERIN 30 mcg/min (05/29/21 1554)   PRN Meds: sodium chloride, acetaminophen, ondansetron (ZOFRAN) IV, sodium chloride flush   Vital Signs    Vitals:   05/29/21 1442 05/29/21 1655 05/29/21 1959 05/30/21 0456  BP: 110/74 130/85 103/70 (!) 142/95  Pulse:  61 64   Resp: 16  (!) 22 (!) 24  Temp:   99.2 F (37.3 C) 99.6 F (37.6 C)  TempSrc:   Oral Oral  SpO2:      Weight:    106.6 kg  Height:        Intake/Output Summary (Last 24 hours) at 05/30/2021 0746 Last data filed at 05/29/2021 1500 Gross per 24 hour  Intake 44.03 ml  Output --  Net 44.03 ml   Last 3 Weights 05/30/2021 05/28/2021 05/28/2021  Weight (lbs) 235 lb 238 lb 1.6 oz 234 lb  Weight (kg) 106.595 kg 108 kg 106.142 kg      Telemetry    SR, PVCs, PACs - Personally Reviewed  ECG   No new tracing this morning.  Physical Exam   GEN: No acute distress.   Neck: No JVD Cardiac: RRR, no murmurs, rubs, or gallops.  Respiratory: Clear to auscultation bilaterally. GI: Soft, nontender, non-distended  MS: No edema; No deformity. Neuro:  Nonfocal  Psych: Normal affect   Labs    High Sensitivity Troponin:   Recent Labs  Lab 05/28/21 0823 05/28/21 1011  TROPONINIHS 12 15      Chemistry Recent Labs  Lab 05/28/21 0823 05/28/21 1535  05/29/21 0059 05/30/21 0146  NA 137  --  138 135  K 4.4  --  4.3 3.9  CL 102  --  105 104  CO2 27  --  27 27  GLUCOSE 111*  --  113* 122*  BUN 17  --  15 16  CREATININE 1.27* 1.24 1.47* 1.43*  CALCIUM 8.9  --  9.1 8.6*  GFRNONAA >60 >60 52* 54*  ANIONGAP 8  --  6 4*     Hematology Recent Labs  Lab 05/28/21 1535 05/29/21 0059 05/30/21 0146  WBC 3.7* 3.9* 5.5  RBC 4.11* 3.93* 3.79*  HGB 11.2* 10.8* 10.4*  HCT 35.9* 34.2* 32.7*  MCV 87.3 87.0 86.3  MCH 27.3 27.5 27.4  MCHC 31.2 31.6 31.8  RDW 17.2* 17.0* 17.2*  PLT 124* 99* 95*    BNPNo results for input(s): BNP, PROBNP in the last 168 hours.   DDimer No results for input(s): DDIMER in the last 168 hours.   Radiology    DG Chest 2 View  Result Date: 05/28/2021 CLINICAL DATA:  Chest pain radiating down the arm EXAM: CHEST - 2 VIEW COMPARISON:  07/16/2017 FINDINGS: Normal heart size and  mediastinal contours when allowing for apical fat pad. There is no edema, consolidation, effusion, or pneumothorax. Multilevel bridging thoracic osteophytes. IMPRESSION: No evidence of active disease. Electronically Signed   By: Monte Fantasia M.D.   On: 05/28/2021 09:11   ECHOCARDIOGRAM COMPLETE  Result Date: 05/28/2021    ECHOCARDIOGRAM REPORT   Patient Name:   Stephen Graves Date of Exam: 05/28/2021 Medical Rec #:  [id number]           Height:       71.0 in Accession #:    [id number]          Weight:       234.0 lb Date of Birth:  09-05-1956           BSA:          2.254 m Patient Age:    65 years            BP:           140/86 mmHg Patient Gender: M                   HR:           60 bpm. Exam Location:  Inpatient Procedure: 2D Echo, Cardiac Doppler and Color Doppler Indications:    Chest Pain  History:        Patient has prior history of Echocardiogram examinations, most                 recent 07/17/2017. CAD.  Sonographer:    Merrie Roof RDCS Referring Phys: [id number] [facility] IMPRESSIONS  1. Left ventricular ejection fraction, by  estimation, is 60 to 65%. The left ventricle has normal function. The left ventricle has no regional wall motion abnormalities. There is moderate concentric left ventricular hypertrophy. Left ventricular diastolic parameters are indeterminate.  2. Right ventricular systolic function is normal. The right ventricular size is normal. There is normal pulmonary artery systolic pressure.  3. Left atrial size was severely dilated.  4. Right atrial size was severely dilated.  5. The mitral valve is normal in structure. No evidence of mitral valve regurgitation. No evidence of mitral stenosis.  6. The aortic valve is normal in structure. Aortic valve regurgitation is not visualized. No aortic stenosis is present.  7. There is borderline dilatation of the ascending aorta, measuring 38 mm.  8. The inferior vena cava is normal in size with greater than 50% respiratory variability, suggesting right atrial pressure of 3 mmHg. FINDINGS  Left Ventricle: Left ventricular ejection fraction, by estimation, is 60 to 65%. The left ventricle has normal function. The left ventricle has no regional wall motion abnormalities. The left ventricular internal cavity size was normal in size. There is  moderate concentric left ventricular hypertrophy. Left ventricular diastolic parameters are indeterminate. Right Ventricle: The right ventricular size is normal. No increase in right ventricular wall thickness. Right ventricular systolic function is normal. There is normal pulmonary artery systolic pressure. The tricuspid regurgitant velocity is 2.84 m/s, and  with an assumed right atrial pressure of 3 mmHg, the estimated right ventricular systolic pressure is [id number] mmHg. Left Atrium: Left atrial size was severely dilated. Right Atrium: Right atrial size was severely dilated. Pericardium: There is no evidence of pericardial effusion. Mitral Valve: The mitral valve is normal in structure. Mild mitral annular calcification. No evidence of mitral valve  regurgitation. No evidence of mitral valve stenosis. Tricuspid Valve: The tricuspid valve is normal in structure. Tricuspid valve regurgitation is trivial.  No evidence of tricuspid stenosis. Aortic Valve: The aortic valve is normal in structure. Aortic valve regurgitation is not visualized. No aortic stenosis is present. Aortic valve mean gradient measures 6.0 mmHg. Aortic valve peak gradient measures 10.8 mmHg. Aortic valve area, by VTI measures 3.56 cm. Pulmonic Valve: The pulmonic valve was normal in structure. Pulmonic valve regurgitation is trivial. No evidence of pulmonic stenosis. Aorta: The aortic root is normal in size and structure. There is borderline dilatation of the ascending aorta, measuring 38 mm. Venous: The inferior vena cava is normal in size with greater than 50% respiratory variability, suggesting right atrial pressure of 3 mmHg. IAS/Shunts: No atrial level shunt detected by color flow Doppler.  LEFT VENTRICLE PLAX 2D LVIDd:         5.10 cm  Diastology LVIDs:         3.80 cm  LV e' medial:    7.94 cm/s LV PW:         1.50 cm  LV E/e' medial:  10.8 LV IVS:        1.50 cm  LV e' lateral:   9.46 cm/s LVOT diam:     2.10 cm  LV E/e' lateral: 9.1 LV SV:         117 LV SV Index:   52 LVOT Area:     3.46 cm  RIGHT VENTRICLE RV Basal diam:  4.70 cm RV Mid diam:    4.30 cm LEFT ATRIUM              Index       RIGHT ATRIUM           Index LA diam:        5.00 cm  2.22 cm/m  RA Area:     31.10 cm LA Vol (A2C):   82.9 ml  36.78 ml/m RA Volume:   117.00 ml 51.91 ml/m LA Vol (A4C):   111.0 ml 49.24 ml/m LA Biplane Vol: 105.0 ml 46.58 ml/m  AORTIC VALVE AV Area (Vmax):    3.42 cm AV Area (Vmean):   3.31 cm AV Area (VTI):     3.56 cm AV Vmax:           164.00 cm/s AV Vmean:          115.000 cm/s AV VTI:            0.328 m AV Peak Grad:      10.8 mmHg AV Mean Grad:      6.0 mmHg LVOT Vmax:         162.00 cm/s LVOT Vmean:        110.000 cm/s LVOT VTI:          0.337 m LVOT/AV VTI ratio: 1.03  AORTA Ao  Root diam: 3.30 cm Ao Asc diam:  3.80 cm MITRAL VALVE               TRICUSPID VALVE MV Area (PHT): 3.65 cm    TR Peak grad:   32.3 mmHg MV Decel Time: 208 msec    TR Vmax:        284.00 cm/s MV E velocity: 85.70 cm/s MV A velocity: 75.40 cm/s  SHUNTS MV E/A ratio:  1.14        Systemic VTI:  0.34 m                            Systemic Diam: 2.10 cm Sanda Klein MD Electronically  signed by Sanda Klein MD Signature Date/Time: 05/28/2021/4:52:34 PM    Final     Cardiac Studies   Echo: 05/28/21  IMPRESSIONS     1. Left ventricular ejection fraction, by estimation, is 60 to 65%. The  left ventricle has normal function. The left ventricle has no regional  wall motion abnormalities. There is moderate concentric left ventricular  hypertrophy. Left ventricular  diastolic parameters are indeterminate.   2. Right ventricular systolic function is normal. The right ventricular  size is normal. There is normal pulmonary artery systolic pressure.   3. Left atrial size was severely dilated.   4. Right atrial size was severely dilated.   5. The mitral valve is normal in structure. No evidence of mitral valve  regurgitation. No evidence of mitral stenosis.   6. The aortic valve is normal in structure. Aortic valve regurgitation is  not visualized. No aortic stenosis is present.   7. There is borderline dilatation of the ascending aorta, measuring 38  mm.   8. The inferior vena cava is normal in size with greater than 50%  respiratory variability, suggesting right atrial pressure of 3 mmHg.   FINDINGS   Left Ventricle: Left ventricular ejection fraction, by estimation, is 60  to 65%. The left ventricle has normal function. The left ventricle has no  regional wall motion abnormalities. The left ventricular internal cavity  size was normal in size. There is   moderate concentric left ventricular hypertrophy. Left ventricular  diastolic parameters are indeterminate.   Right Ventricle: The right  ventricular size is normal. No increase in  right ventricular wall thickness. Right ventricular systolic function is  normal. There is normal pulmonary artery systolic pressure. The tricuspid  regurgitant velocity is 2.84 m/s, and   with an assumed right atrial pressure of 3 mmHg, the estimated right  ventricular systolic pressure is [id number] mmHg.   Left Atrium: Left atrial size was severely dilated.   Right Atrium: Right atrial size was severely dilated.   Pericardium: There is no evidence of pericardial effusion.   Mitral Valve: The mitral valve is normal in structure. Mild mitral annular  calcification. No evidence of mitral valve regurgitation. No evidence of  mitral valve stenosis.   Tricuspid Valve: The tricuspid valve is normal in structure. Tricuspid  valve regurgitation is trivial. No evidence of tricuspid stenosis.   Aortic Valve: The aortic valve is normal in structure. Aortic valve  regurgitation is not visualized. No aortic stenosis is present. Aortic  valve mean gradient measures 6.0 mmHg. Aortic valve peak gradient measures  10.8 mmHg. Aortic valve area, by VTI  measures 3.56 cm.   Pulmonic Valve: The pulmonic valve was normal in structure. Pulmonic valve  regurgitation is trivial. No evidence of pulmonic stenosis.   Aorta: The aortic root is normal in size and structure. There is  borderline dilatation of the ascending aorta, measuring 38 mm.   Venous: The inferior vena cava is normal in size with greater than 50%  respiratory variability, suggesting right atrial pressure of 3 mmHg.   IAS/Shunts: No atrial level shunt detected by color flow Doppler.   Patient Profile     65 y.o. 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 Sallyanne Kuster in the ER for recurrent chest pain with symptoms consistent with unstable angina.  Several episodes of substernal chest pain relieved with  some relief with nitroglycerin.  Radiated  to the back and left arm.  Transferred from [facility] to [facility] with plans for cardiac cath.  Troponin levels negative, EKG with subtle changes.    Assessment & Plan    Unstable angina with Hx of CAD s/p stent to LAD: presented with chest pain. hsTn negative x2. Developed recurrent chest pain yesterday afternoon and placed on IV nitro/heparin.  -- remains on IV heparin, ASA, Coreg 6.'25mg'$  BID, Crestor '20mg'$  daily, losartan '100mg'$ , lovaza -- echo EF 60-65% with no rWMA noted, severe biatrial enlargement  HTN: initially elevated but improved -- continue coreg 6.'25mg'$  BID, along with losartan '100mg'$  daily  HLD: on crestor '20mg'$  daily -- LDL 45  Pre DM: Hgb A1c 6.4 -- consider addition of SGLT2  Elevated Cr: 1.2 on admission up to 1.47>>1.43. Appears baseline around 1.2-1.5 -- receiving pre cath IVFs  For questions or updates, please contact [place] Please consult [email or web address] for contact info under        Signed, Reino Bellis, NP  05/30/2021, 7:46 AM

## 2021-05-30 NOTE — Progress Notes (Signed)
Progress Note  Patient Name: Stephen Graves Date of Encounter: 05/30/2021  [facility] Cardiologist: Carlyle Dolly, MD   Subjective   No further episodes of chest pain this morning. Remains on IV nitro.   Inpatient Medications    Scheduled Meds:  [START ON 05/31/2021] aspirin EC  81 mg Oral Daily   carvedilol  6.25 mg Oral BID WC   citalopram  20 mg Oral Daily   losartan  100 mg Oral Daily   omega-3 acid ethyl esters  1 g Oral Daily   rosuvastatin  20 mg Oral Daily   sodium chloride flush  3 mL Intravenous Q12H   Continuous Infusions:  sodium chloride     sodium chloride 1 mL/kg/hr (05/30/21 0634)   heparin 1,250 Units/hr (05/30/21 0019)   nitroGLYCERIN 30 mcg/min (05/29/21 1554)   PRN Meds: sodium chloride, acetaminophen, ondansetron (ZOFRAN) IV, sodium chloride flush   Vital Signs    Vitals:   05/29/21 1442 05/29/21 1655 05/29/21 1959 05/30/21 0456  BP: 110/74 130/85 103/70 (!) 142/95  Pulse:  61 64   Resp: 16  (!) 22 (!) 24  Temp:   99.2 F (37.3 C) 99.6 F (37.6 C)  TempSrc:   Oral Oral  SpO2:      Weight:    106.6 kg  Height:        Intake/Output Summary (Last 24 hours) at 05/30/2021 0746 Last data filed at 05/29/2021 1500 Gross per 24 hour  Intake 44.03 ml  Output --  Net 44.03 ml   Last 3 Weights 05/30/2021 05/28/2021 05/28/2021  Weight (lbs) 235 lb 238 lb 1.6 oz 234 lb  Weight (kg) 106.595 kg 108 kg 106.142 kg      Telemetry    SR, PVCs, PACs - Personally Reviewed  ECG   No new tracing this morning.  Physical Exam   GEN: No acute distress.   Neck: No JVD Cardiac: RRR, no murmurs, rubs, or gallops.  Respiratory: Clear to auscultation bilaterally. GI: Soft, nontender, non-distended  MS: No edema; No deformity. Neuro:  Nonfocal  Psych: Normal affect   Labs    High Sensitivity Troponin:   Recent Labs  Lab 05/28/21 0823 05/28/21 1011  TROPONINIHS 12 15      Chemistry Recent Labs  Lab 05/28/21 0823 05/28/21 1535  05/29/21 0059 05/30/21 0146  NA 137  --  138 135  K 4.4  --  4.3 3.9  CL 102  --  105 104  CO2 27  --  27 27  GLUCOSE 111*  --  113* 122*  BUN 17  --  15 16  CREATININE 1.27* 1.24 1.47* 1.43*  CALCIUM 8.9  --  9.1 8.6*  GFRNONAA >60 >60 52* 54*  ANIONGAP 8  --  6 4*     Hematology Recent Labs  Lab 05/28/21 1535 05/29/21 0059 05/30/21 0146  WBC 3.7* 3.9* 5.5  RBC 4.11* 3.93* 3.79*  HGB 11.2* 10.8* 10.4*  HCT 35.9* 34.2* 32.7*  MCV 87.3 87.0 86.3  MCH 27.3 27.5 27.4  MCHC 31.2 31.6 31.8  RDW 17.2* 17.0* 17.2*  PLT 124* 99* 95*    BNPNo results for input(s): BNP, PROBNP in the last 168 hours.   DDimer No results for input(s): DDIMER in the last 168 hours.   Radiology    DG Chest 2 View  Result Date: 05/28/2021 CLINICAL DATA:  Chest pain radiating down the arm EXAM: CHEST - 2 VIEW COMPARISON:  07/16/2017 FINDINGS: Normal heart size and  mediastinal contours when allowing for apical fat pad. There is no edema, consolidation, effusion, or pneumothorax. Multilevel bridging thoracic osteophytes. IMPRESSION: No evidence of active disease. Electronically Signed   By: Monte Fantasia M.D.   On: 05/28/2021 09:11   ECHOCARDIOGRAM COMPLETE  Result Date: 05/28/2021    ECHOCARDIOGRAM REPORT   Patient Name:   Stephen Graves Date of Exam: 05/28/2021 Medical Rec #:  [id number]           Height:       71.0 in Accession #:    [id number]          Weight:       234.0 lb Date of Birth:  18-May-1956           BSA:          2.254 m Patient Age:    65 years            BP:           140/86 mmHg Patient Gender: M                   HR:           60 bpm. Exam Location:  Inpatient Procedure: 2D Echo, Cardiac Doppler and Color Doppler Indications:    Chest Pain  History:        Patient has prior history of Echocardiogram examinations, most                 recent 07/17/2017. CAD.  Sonographer:    Merrie Roof RDCS Referring Phys: [id number] [facility] IMPRESSIONS  1. Left ventricular ejection fraction, by  estimation, is 60 to 65%. The left ventricle has normal function. The left ventricle has no regional wall motion abnormalities. There is moderate concentric left ventricular hypertrophy. Left ventricular diastolic parameters are indeterminate.  2. Right ventricular systolic function is normal. The right ventricular size is normal. There is normal pulmonary artery systolic pressure.  3. Left atrial size was severely dilated.  4. Right atrial size was severely dilated.  5. The mitral valve is normal in structure. No evidence of mitral valve regurgitation. No evidence of mitral stenosis.  6. The aortic valve is normal in structure. Aortic valve regurgitation is not visualized. No aortic stenosis is present.  7. There is borderline dilatation of the ascending aorta, measuring 38 mm.  8. The inferior vena cava is normal in size with greater than 50% respiratory variability, suggesting right atrial pressure of 3 mmHg. FINDINGS  Left Ventricle: Left ventricular ejection fraction, by estimation, is 60 to 65%. The left ventricle has normal function. The left ventricle has no regional wall motion abnormalities. The left ventricular internal cavity size was normal in size. There is  moderate concentric left ventricular hypertrophy. Left ventricular diastolic parameters are indeterminate. Right Ventricle: The right ventricular size is normal. No increase in right ventricular wall thickness. Right ventricular systolic function is normal. There is normal pulmonary artery systolic pressure. The tricuspid regurgitant velocity is 2.84 m/s, and  with an assumed right atrial pressure of 3 mmHg, the estimated right ventricular systolic pressure is [id number] mmHg. Left Atrium: Left atrial size was severely dilated. Right Atrium: Right atrial size was severely dilated. Pericardium: There is no evidence of pericardial effusion. Mitral Valve: The mitral valve is normal in structure. Mild mitral annular calcification. No evidence of mitral valve  regurgitation. No evidence of mitral valve stenosis. Tricuspid Valve: The tricuspid valve is normal in structure. Tricuspid valve regurgitation is trivial.  No evidence of tricuspid stenosis. Aortic Valve: The aortic valve is normal in structure. Aortic valve regurgitation is not visualized. No aortic stenosis is present. Aortic valve mean gradient measures 6.0 mmHg. Aortic valve peak gradient measures 10.8 mmHg. Aortic valve area, by VTI measures 3.56 cm. Pulmonic Valve: The pulmonic valve was normal in structure. Pulmonic valve regurgitation is trivial. No evidence of pulmonic stenosis. Aorta: The aortic root is normal in size and structure. There is borderline dilatation of the ascending aorta, measuring 38 mm. Venous: The inferior vena cava is normal in size with greater than 50% respiratory variability, suggesting right atrial pressure of 3 mmHg. IAS/Shunts: No atrial level shunt detected by color flow Doppler.  LEFT VENTRICLE PLAX 2D LVIDd:         5.10 cm  Diastology LVIDs:         3.80 cm  LV e' medial:    7.94 cm/s LV PW:         1.50 cm  LV E/e' medial:  10.8 LV IVS:        1.50 cm  LV e' lateral:   9.46 cm/s LVOT diam:     2.10 cm  LV E/e' lateral: 9.1 LV SV:         117 LV SV Index:   52 LVOT Area:     3.46 cm  RIGHT VENTRICLE RV Basal diam:  4.70 cm RV Mid diam:    4.30 cm LEFT ATRIUM              Index       RIGHT ATRIUM           Index LA diam:        5.00 cm  2.22 cm/m  RA Area:     31.10 cm LA Vol (A2C):   82.9 ml  36.78 ml/m RA Volume:   117.00 ml 51.91 ml/m LA Vol (A4C):   111.0 ml 49.24 ml/m LA Biplane Vol: 105.0 ml 46.58 ml/m  AORTIC VALVE AV Area (Vmax):    3.42 cm AV Area (Vmean):   3.31 cm AV Area (VTI):     3.56 cm AV Vmax:           164.00 cm/s AV Vmean:          115.000 cm/s AV VTI:            0.328 m AV Peak Grad:      10.8 mmHg AV Mean Grad:      6.0 mmHg LVOT Vmax:         162.00 cm/s LVOT Vmean:        110.000 cm/s LVOT VTI:          0.337 m LVOT/AV VTI ratio: 1.03  AORTA Ao  Root diam: 3.30 cm Ao Asc diam:  3.80 cm MITRAL VALVE               TRICUSPID VALVE MV Area (PHT): 3.65 cm    TR Peak grad:   32.3 mmHg MV Decel Time: 208 msec    TR Vmax:        284.00 cm/s MV E velocity: 85.70 cm/s MV A velocity: 75.40 cm/s  SHUNTS MV E/A ratio:  1.14        Systemic VTI:  0.34 m                            Systemic Diam: 2.10 cm Sanda Klein MD Electronically  signed by Sanda Klein MD Signature Date/Time: 05/28/2021/4:52:34 PM    Final     Cardiac Studies   Echo: 05/28/21  IMPRESSIONS     1. Left ventricular ejection fraction, by estimation, is 60 to 65%. The  left ventricle has normal function. The left ventricle has no regional  wall motion abnormalities. There is moderate concentric left ventricular  hypertrophy. Left ventricular  diastolic parameters are indeterminate.   2. Right ventricular systolic function is normal. The right ventricular  size is normal. There is normal pulmonary artery systolic pressure.   3. Left atrial size was severely dilated.   4. Right atrial size was severely dilated.   5. The mitral valve is normal in structure. No evidence of mitral valve  regurgitation. No evidence of mitral stenosis.   6. The aortic valve is normal in structure. Aortic valve regurgitation is  not visualized. No aortic stenosis is present.   7. There is borderline dilatation of the ascending aorta, measuring 38  mm.   8. The inferior vena cava is normal in size with greater than 50%  respiratory variability, suggesting right atrial pressure of 3 mmHg.   FINDINGS   Left Ventricle: Left ventricular ejection fraction, by estimation, is 60  to 65%. The left ventricle has normal function. The left ventricle has no  regional wall motion abnormalities. The left ventricular internal cavity  size was normal in size. There is   moderate concentric left ventricular hypertrophy. Left ventricular  diastolic parameters are indeterminate.   Right Ventricle: The right  ventricular size is normal. No increase in  right ventricular wall thickness. Right ventricular systolic function is  normal. There is normal pulmonary artery systolic pressure. The tricuspid  regurgitant velocity is 2.84 m/s, and   with an assumed right atrial pressure of 3 mmHg, the estimated right  ventricular systolic pressure is [id number] mmHg.   Left Atrium: Left atrial size was severely dilated.   Right Atrium: Right atrial size was severely dilated.   Pericardium: There is no evidence of pericardial effusion.   Mitral Valve: The mitral valve is normal in structure. Mild mitral annular  calcification. No evidence of mitral valve regurgitation. No evidence of  mitral valve stenosis.   Tricuspid Valve: The tricuspid valve is normal in structure. Tricuspid  valve regurgitation is trivial. No evidence of tricuspid stenosis.   Aortic Valve: The aortic valve is normal in structure. Aortic valve  regurgitation is not visualized. No aortic stenosis is present. Aortic  valve mean gradient measures 6.0 mmHg. Aortic valve peak gradient measures  10.8 mmHg. Aortic valve area, by VTI  measures 3.56 cm.   Pulmonic Valve: The pulmonic valve was normal in structure. Pulmonic valve  regurgitation is trivial. No evidence of pulmonic stenosis.   Aorta: The aortic root is normal in size and structure. There is  borderline dilatation of the ascending aorta, measuring 38 mm.   Venous: The inferior vena cava is normal in size with greater than 50%  respiratory variability, suggesting right atrial pressure of 3 mmHg.   IAS/Shunts: No atrial level shunt detected by color flow Doppler.   Patient Profile     65 y.o. 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 Sallyanne Kuster in the ER for recurrent chest pain with symptoms consistent with unstable angina.  Several episodes of substernal chest pain relieved with  some relief with nitroglycerin.  Radiated  to the back and left arm.  Transferred from [facility] to [facility] with plans for cardiac cath.  Troponin levels negative, EKG with subtle changes.    Assessment & Plan    Unstable angina with Hx of CAD s/p stent to LAD: presented with chest pain. hsTn negative x2. Developed recurrent chest pain yesterday afternoon and placed on IV nitro/heparin.  -- remains on IV heparin, ASA, Coreg 6.'25mg'$  BID, Crestor '20mg'$  daily, losartan '100mg'$ , lovaza -- echo EF 60-65% with no rWMA noted, severe biatrial enlargement  HTN: initially elevated but improved -- continue coreg 6.'25mg'$  BID, along with losartan '100mg'$  daily  HLD: on crestor '20mg'$  daily -- LDL 45  Pre DM: Hgb A1c 6.4 -- consider addition of SGLT2  Elevated Cr: 1.2 on admission up to 1.47>>1.43. Appears baseline around 1.2-1.5 -- receiving pre cath IVFs  For questions or updates, please contact [place] Please consult [email or web address] for contact info under        Signed, Reino Bellis, NP  05/30/2021, 7:46 AM

## 2021-05-30 NOTE — TOC Benefit Eligibility Note (Signed)
Transition of Care [facility]) Benefit Eligibility Note    Patient Details  Name: KAWASKI ART MRN: [id number] Date of Birth: 06/29/1956   Medication/Dose: BRILINTA  90 MG BID  Covered?: Yes  Tier:  (TIER 4 DRUG)  Prescription Coverage Preferred Pharmacy: [place]    and    [place] [facility]  Spoke with Person/Company/Phone Number:: WILLIW  @ [facility] [place] RX #  [phone] OPT- MEMBER  Co-Pay: $100.00  Prior Approval: No  Deductible: Unmet (OUT-OF-POCKET:UNMET)  Additional Notes: TICAGRELOR : Crecencio Mc Phone Number: 05/30/2021, 3:31 PM

## 2021-05-30 NOTE — TOC Benefit Eligibility Note (Signed)
Patient Teacher, English as a foreign language completed.    The patient is currently admitted and upon discharge could be taking Brilinta 90 mg.  The current 30 day co-pay is, $100.00.   The patient is insured through [place], [place] Patient Advocate Specialist [place] Team Direct Number: [phone]  Fax: [phone]

## 2021-05-30 NOTE — Interval H&P Note (Signed)
History and Physical Interval Note:  05/30/2021 11:15 AM  Stephen Graves  has presented today for surgery, with the diagnosis of unstable angina.  The various methods of treatment have been discussed with the patient and family. After consideration of risks, benefits and other options for treatment, the patient has consented to  Procedure(s): LEFT HEART CATH AND CORONARY ANGIOGRAPHY (N/A)  PERCUTANEOUS CORONARY INTERVENTION  as a surgical intervention.  The patient's history has been reviewed, patient examined, no change in status, stable for surgery.  I have reviewed the patient's chart and labs.  Questions were answered to the patient's satisfaction.    Cath Lab Visit (complete for each Cath Lab visit)  Clinical Evaluation Leading to the Procedure:   ACS: Yes.    Non-ACS:    Anginal Classification: CCS IV  Anti-ischemic medical therapy: Maximal Therapy (2 or more classes of medications)  Non-Invasive Test Results: No non-invasive testing performed  Prior CABG: No previous CABG    Glenetta Hew

## 2021-05-31 ENCOUNTER — Other Ambulatory Visit: Payer: Self-pay | Admitting: Physician Assistant

## 2021-05-31 ENCOUNTER — Other Ambulatory Visit (HOSPITAL_COMMUNITY): Payer: Self-pay

## 2021-05-31 LAB — BASIC METABOLIC PANEL
Anion gap: 7 (ref 5–15)
BUN: 16 mg/dL (ref 8–23)
CO2: 22 mmol/L (ref 22–32)
Calcium: 8.5 mg/dL — ABNORMAL LOW (ref 8.9–10.3)
Chloride: 106 mmol/L (ref 98–111)
Creatinine, Ser: 1.43 mg/dL — ABNORMAL HIGH (ref 0.61–1.24)
GFR, Estimated: 54 mL/min — ABNORMAL LOW (ref >60)
Glucose, Bld: 114 mg/dL — ABNORMAL HIGH (ref 70–99)
Potassium: 3.8 mmol/L (ref 3.5–5.1)
Sodium: 135 mmol/L (ref 135–145)

## 2021-05-31 LAB — CBC
HCT: 31.8 % — ABNORMAL LOW (ref 39.0–52.0)
Hemoglobin: 10 g/dL — ABNORMAL LOW (ref 13.0–17.0)
MCH: 27.2 pg (ref 26.0–34.0)
MCHC: 31.4 g/dL (ref 30.0–36.0)
MCV: 86.6 fL (ref 80.0–100.0)
Platelets: 79 10*3/uL — ABNORMAL LOW (ref 150–400)
RBC: 3.67 MIL/uL — ABNORMAL LOW (ref 4.22–5.81)
RDW: 17.3 % — ABNORMAL HIGH (ref 11.5–15.5)
WBC: 4.6 10*3/uL (ref 4.0–10.5)
nRBC: 0 % (ref 0.0–0.2)

## 2021-05-31 MED ORDER — CITALOPRAM HYDROBROMIDE 40 MG PO TABS: 20.0000 mg | ORAL_TABLET | Freq: Every day | ORAL | Status: DC

## 2021-05-31 MED ORDER — CARVEDILOL 3.125 MG PO TABS: 6.2500 mg | ORAL_TABLET | Freq: Two times a day (BID) | ORAL | 11 refills | Status: DC

## 2021-05-31 MED ORDER — CITALOPRAM HYDROBROMIDE 40 MG PO TABS: 20.0000 mg | ORAL_TABLET | Freq: Every day | ORAL | 11 refills | Status: DC

## 2021-05-31 MED ORDER — TICAGRELOR 90 MG PO TABS
90.0000 mg | ORAL_TABLET | Freq: Two times a day (BID) | ORAL | 11 refills | Status: DC
  Filled 2021-05-31: qty 60, 30d supply, fill #0

## 2021-05-31 NOTE — Progress Notes (Addendum)
Discharge note Sister at bedside. Reinforced education. Verified that sister has stent card.

## 2021-05-31 NOTE — Progress Notes (Signed)
CARDIAC REHAB PHASE I   PRE:  Rate/Rhythm: 89 SR    BP: sitting 167/100    SaO2: 98 RA  MODE:  Ambulation: 320 ft   POST:  Rate/Rhythm: 76 SR with PACs/PVCs    BP: sitting 151/102     SaO2: 98 RA  Pt able to slowly get up independently and walk hall with standby assist and gait belt. No LOB, sts his breathing is tighter with distance but denied CP. Tolerated well, to recliner. BP elevated before and after walk, increased ectopy. Discussed stent, Brilinta, restrictions, diet, exercise, NTG, and CRPII. Discussed pts A1C and he will work on his diet. Admits he eats junk food. Does not cook but we discussed options for healthier choices. Pt also agreeable to walking more. Will refer to [facility] CRPII however he will need transportation if the program can provide.  [place], ACSM 05/31/2021 10:13 AM

## 2021-05-31 NOTE — Progress Notes (Signed)
Discharge note  Patient alert and oriented. Verbalized understanding of instructions. Pivs dcd and site unremarkable. No bleeding noted. CCMD made aware of dc order. All belongings given to patient. Primary RN  to call volunteer desk when sister arrives .

## 2021-05-31 NOTE — Discharge Summary (Signed)
Discharge Summary    Patient ID: Stephen Graves MRN: [id number]; DOB: Apr 05, 1956  Admit date: 05/28/2021 Discharge date: 05/31/2021  PCP:  [facility]   [facility] Providers Cardiologist:  Carlyle Dolly, MD   {  Discharge Diagnoses    Principal Problem:   Acute coronary syndrome [facility]) Active Problems:   Essential hypertension   Chest pain   Hyperlipidemia with target LDL less than 70   Coronary artery disease involving native coronary 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 ([place])   Presence of drug coated stent in LAD coronary artery    Diagnostic Studies/Procedures    Cath: 05/30/21  Ost LAD to Prox LAD lesion is 30% stenosed.   CULPRIT LESION SEGMENT: Mid LAD-1 lesion is 55% stenosed & Mid LAD-2 lesion is 100% stenosed.-Appears to be chronically occluded (with negative troponins at least greater than 1 week)   Mid LAD to Dist LAD stent is 100% stenosed. ->   Balloon angioplasty was performed using a 2.25 x 20 mm Sapphire balloon to restore TIMI-3 flow from the occlusion site to the previously placed stent..   A drug-eluting stent was successfully placed covering the entire segment of 2 and overlapping the previously placed stent, using a SYNERGY XD 2.50X48.  Postdilated to 2.85 mm including the previously placed stent.   Post intervention, there is a 0% residual stenosis in the entire mid LAD lesions extending into the previously placed mid-distal LAD stent.[name]   -----------------------------------------   Ramus-1 lesion is 60% stenosed.  Ramus-2 lesion is 60% stenosed.   RV Branch lesion is 75% stenosed.   ------------------------------------------.   LV end diastolic pressure is mildly elevated.   There is no aortic valve stenosis.   SUMMARY Left Dominant system with 3-vessel CAD: Proximal to mid LAD after D1 long 50% stenosis with 100% likely CTO after SP2 prior to previously placed distal LAD stent. Successful CTO PTCA/DES PCI  of proximal to distal LAD from just distal to D1 overlapping previously placed stent restoring TIMI-3 flow (Synergy XD DES 2.5 mm x 48 mm postdilated to 2.85 mm)  Ostial RI 60% and proximal RI 60% 75% focal stenosis of small nondominant RCA Very large caliber, dominant LCx with diffuse 20 to 30% stenosis in the PDA LVEDP 18 mmHg.   RECOMMENDATIONS Wean off nitroglycerin infusion overnight, titrate antihypertensives while inpatient-anticipate discharge in the morning. [place] 1 consult DAPT uninterrupted x1 year and then lifelong Thienopyridine.       Glenetta Hew, MD    Diagnostic Dominance: Left Intervention    Echo: 05/28/21  IMPRESSIONS     1. Left ventricular ejection fraction, by estimation, is 60 to 65%. The  left ventricle has normal function. The left ventricle has no regional  wall motion abnormalities. There is moderate concentric left ventricular  hypertrophy. Left ventricular  diastolic parameters are indeterminate.   2. Right ventricular systolic function is normal. The right ventricular  size is normal. There is normal pulmonary artery systolic pressure.   3. Left atrial size was severely dilated.   4. Right atrial size was severely dilated.   5. The mitral valve is normal in structure. No evidence of mitral valve  regurgitation. No evidence of mitral stenosis.   6. The aortic valve is normal in structure. Aortic valve regurgitation is  not visualized. No aortic stenosis is present.   7. There is borderline dilatation of the ascending aorta, measuring 38  mm.   8. The inferior vena cava is normal in size  with greater than 50%  respiratory variability, suggesting right atrial pressure of 3 mmHg.   FINDINGS   Left Ventricle: Left ventricular ejection fraction, by estimation, is 60  to 65%. The left ventricle has normal function. The left ventricle has no  regional wall motion abnormalities. The left ventricular internal cavity  size was normal in size. There is    moderate concentric left ventricular hypertrophy. Left ventricular  diastolic parameters are indeterminate.   Right Ventricle: The right ventricular size is normal. No increase in  right ventricular wall thickness. Right ventricular systolic function is  normal. There is normal pulmonary artery systolic pressure. The tricuspid  regurgitant velocity is 2.84 m/s, and   with an assumed right atrial pressure of 3 mmHg, the estimated right  ventricular systolic pressure is [id number] mmHg.   Left Atrium: Left atrial size was severely dilated.   Right Atrium: Right atrial size was severely dilated.   Pericardium: There is no evidence of pericardial effusion.   Mitral Valve: The mitral valve is normal in structure. Mild mitral annular  calcification. No evidence of mitral valve regurgitation. No evidence of  mitral valve stenosis.   Tricuspid Valve: The tricuspid valve is normal in structure. Tricuspid  valve regurgitation is trivial. No evidence of tricuspid stenosis.   Aortic Valve: The aortic valve is normal in structure. Aortic valve  regurgitation is not visualized. No aortic stenosis is present. Aortic  valve mean gradient measures 6.0 mmHg. Aortic valve peak gradient measures  10.8 mmHg. Aortic valve area, by VTI  measures 3.56 cm.   Pulmonic Valve: The pulmonic valve was normal in structure. Pulmonic valve  regurgitation is trivial. No evidence of pulmonic stenosis.   Aorta: The aortic root is normal in size and structure. There is  borderline dilatation of the ascending aorta, measuring 38 mm.   Venous: The inferior vena cava is normal in size with greater than 50%  respiratory variability, suggesting right atrial pressure of 3 mmHg.   IAS/Shunts: No atrial level shunt detected by color flow Doppler.  _____________   History of Present Illness     Stephen Graves is a 65 y.o. male with PMH of CAD s/p DES to mid-distal LAD in 06/2017, chronic chest pain, hypertension,  hyperlipidemia, anxiety, obesity, who is being seen 05/28/2021 for the evaluation of pain.  He underwent left heart catheterization on 07/17/2018 which revealed severe one-vessel CAD with 99% stenosis in mid to distal LAD, and was treated with DES.  Normal LV systolic function was noted at the time.  Echo from 07/17/2017 revealed EF 60 to 65%, grade indeterminate diastolic dysfunction.  He was evaluated by Dr. Harl Bowie last on 05/23/2021, reports to have starts in the back and radiates upward and improves with sodas.  He was otherwise doing well and chronically medically managed carvedilol, fish oil, losartan-hydrochlorothiazide, and Crestor.   Patient presented to the ER today complaining mid sternal chest pain that started around 0105 am.  He had taken 2 nitroglycerin which seemed to help the symptoms went to sleep, and took another 2 nitroglycerin at 4:30 AM due to recurrent chest pain, however pain was not relieved with nitro.  He also reports pain radiation to his back and left arm.     Admission diagnostic revealed elevated creatinine 1.27, otherwise unremarkable BMP.  High sensitive troponin 12 >15.  CBC with hemoglobin of 11.8, platelet 126k.  Flu and COVID-19 negative.  Chest x-ray revealed no acute finding.  Twelve-lead EKG showed sinus rhythm with occasional  PVCs, ventricular rate of 65 bpm, no acute ischemic change.  He has remained hemodynamically stable at ED.  He was given nitroglycerin ointment and tablets at ED.  Subsequently he is transferred from [facility] to [facility] for admission.   Hospital Course     Unstable angina with Hx of CAD s/p stent to LAD: presented with chest pain. hsTn negative x2. Treated with IV heparin and underwent cardiac cath noted above with p/mLAD after D1 100% likely CTO treated with successful PCI/DES overlapping previously placed stent. Planned for DAPT with ASA/Brilinta for at least one year, likely lifelong given extensive stenting in the LAD. No  recurrent chest pain. Seen by CR.  -- continue on ASA, Brilinta, Coreg 6.'25mg'$  BID, Crestor '20mg'$  daily, losartan '100mg'$ , lovaza -- echo EF 60-65% with no rWMA noted, severe biatrial enlargement   HTN: initially elevated but improved during admission.  -- continue coreg 6.'25mg'$  BID, along with losartan '100mg'$ /HCTZ 12.'5mg'$  daily   HLD: on crestor '20mg'$  daily -- LDL 45   Pre DM: Hgb A1c 6.4 -- consider addition of SGLT2 as an outpatient pending cost   Elevated Cr: 1.2 on admission up to 1.47>>1.43. Appears baseline around 1.2-1.5 -- received pre/post cath hydration. Cr stable at 1.43  Anxiety: Celexa was reduced from '40mg'$  to '20mg'$  daily on admission with prolonged QTc  General: Well developed, well nourished, male appearing in no acute distress. Head: Normocephalic, atraumatic.  Neck: Supple without bruits, JVD. Lungs:  Resp regular and unlabored, CTA. Heart: RRR, S1, S2, no S3, S4, or murmur; no rub. Abdomen: Soft, non-tender, non-distended with normoactive bowel sounds. No hepatomegaly. No rebound/guarding. No obvious abdominal masses. Extremities: No clubbing, cyanosis, edema. Distal pedal pulses are 2+ bilaterally. Right radial cath site stable without bruising or hematoma Neuro: Alert and oriented X 3. Moves all extremities spontaneously. Psych: Normal affect.   Patient was seen by Dr. [place] and deemed stable for discharge. Follow up has been arranged in the office. Medications sent to [place]. Educated by PharmD prior to discharge.   Did the patient have an acute coronary syndrome (MI, NSTEMI, STEMI, etc) this admission?:  Yes                               AHA/ACC Clinical Performance & Quality Measures: Aspirin prescribed? - Yes ADP Receptor Inhibitor (Plavix/Clopidogrel, Brilinta/Ticagrelor or Effient/Prasugrel) prescribed (includes medically managed patients)? - Yes Beta Blocker prescribed? - Yes High Intensity Statin (Lipitor 40-'80mg'$  or Crestor 20-'40mg'$ ) prescribed? -  Yes EF assessed during THIS hospitalization? - Yes For EF <40%, was ACEI/ARB prescribed? - Not Applicable (EF >/= [id number]) For EF <40%, Aldosterone Antagonist (Spironolactone or Eplerenone) prescribed? - Not Applicable (EF >/= [id number]) Cardiac Rehab Phase II ordered (including medically managed patients)? - Yes      _____________  Discharge Vitals Blood pressure (!) 147/87, pulse 66, temperature 98.7 F (37.1 C), temperature source Oral, resp. rate 20, height 6' (1.829 m), weight 108.4 kg, SpO2 100 %.  Filed Weights   05/28/21 1600 05/30/21 0456 05/31/21 0417  Weight: 108 kg 106.6 kg 108.4 kg    Labs & Radiologic Studies    CBC Recent Labs    05/30/21 0146 05/31/21 0232  WBC 5.5 4.6  HGB 10.4* 10.0*  HCT 32.7* 31.8*  MCV 86.3 86.6  PLT 95* 79*   Basic Metabolic Panel Recent Labs    05/30/21 0146 05/31/21 0232  NA 135 135  K 3.9  3.8  CL 104 106  CO2 27 22  GLUCOSE 122* 114*  BUN 16 16  CREATININE 1.43* 1.43*  CALCIUM 8.6* 8.5*   Liver Function Tests No results for input(s): AST, ALT, ALKPHOS, BILITOT, PROT, ALBUMIN in the last 72 hours. No results for input(s): LIPASE, AMYLASE in the last 72 hours. High Sensitivity Troponin:   Recent Labs  Lab 05/28/21 0823 05/28/21 1011  TROPONINIHS 12 15    BNP Invalid input(s): POCBNP D-Dimer No results for input(s): DDIMER in the last 72 hours. Hemoglobin A1C Recent Labs    05/28/21 1535  HGBA1C 6.4*   Fasting Lipid Panel Recent Labs    05/29/21 0059  CHOL 107  HDL 42  LDLCALC 45  TRIG 102  CHOLHDL 2.5   Thyroid Function Tests No results for input(s): TSH, T4TOTAL, T3FREE, THYROIDAB in the last 72 hours.  Invalid input(s): FREET3 _____________  DG Chest 2 View  Result Date: 05/28/2021 CLINICAL DATA:  Chest pain radiating down the arm EXAM: CHEST - 2 VIEW COMPARISON:  07/16/2017 FINDINGS: Normal heart size and mediastinal contours when allowing for apical fat pad. There is no edema, consolidation, effusion, or  pneumothorax. Multilevel bridging thoracic osteophytes. IMPRESSION: No evidence of active disease. Electronically Signed   By: Monte Fantasia M.D.   On: 05/28/2021 09:11   CARDIAC CATHETERIZATION  Result Date: 05/30/2021   Ost LAD to Prox LAD lesion is 30% stenosed.   CULPRIT LESION SEGMENT: Mid LAD-1 lesion is 55% stenosed & Mid LAD-2 lesion is 100% stenosed.-Appears to be chronically occluded (with negative troponins at least greater than 1 week)   Mid LAD to Dist LAD stent is 100% stenosed. ->   Balloon angioplasty was performed using a 2.25 x 20 mm Sapphire balloon to restore TIMI-3 flow from the occlusion site to the previously placed stent..   A drug-eluting stent was successfully placed covering the entire segment of 2 and overlapping the previously placed stent, using a SYNERGY XD 2.50X48.  Postdilated to 2.85 mm including the previously placed stent.   Post intervention, there is a 0% residual stenosis in the entire mid LAD lesions extending into the previously placed mid-distal LAD stent.[name]   -----------------------------------------   Ramus-1 lesion is 60% stenosed.  Ramus-2 lesion is 60% stenosed.   RV Branch lesion is 75% stenosed.   ------------------------------------------.   LV end diastolic pressure is mildly elevated.   There is no aortic valve stenosis. SUMMARY Left Dominant system with 3-vessel CAD: Proximal to mid LAD after D1 long 50% stenosis with 100% likely CTO after SP2 prior to previously placed distal LAD stent. Successful CTO PTCA/DES PCI of proximal to distal LAD from just distal to D1 overlapping previously placed stent restoring TIMI-3 flow (Synergy XD DES 2.5 mm x 48 mm postdilated to 2.85 mm) Ostial RI 60% and proximal RI 60% 75% focal stenosis of small nondominant RCA Very large caliber, dominant LCx with diffuse 20 to 30% stenosis in the PDA LVEDP 18 mmHg. RECOMMENDATIONS Wean off nitroglycerin infusion overnight, titrate antihypertensives while inpatient-anticipate discharge  in the morning. [place] 1 consult DAPT uninterrupted x1 year and then lifelong Thienopyridine. Glenetta Hew, MD  ECHOCARDIOGRAM COMPLETE  Result Date: 05/28/2021    ECHOCARDIOGRAM REPORT   Patient Name:   Stephen Graves Date of Exam: 05/28/2021 Medical Rec #:  [id number]           Height:       71.0 in Accession #:    [id number]  Weight:       234.0 lb Date of Birth:  1955/11/11           BSA:          2.254 m Patient Age:    45 years            BP:           140/86 mmHg Patient Gender: M                   HR:           60 bpm. Exam Location:  Inpatient Procedure: 2D Echo, Cardiac Doppler and Color Doppler Indications:    Chest Pain  History:        Patient has prior history of Echocardiogram examinations, most                 recent 07/17/2017. CAD.  Sonographer:    Merrie Roof RDCS Referring Phys: [id number] [facility] IMPRESSIONS  1. Left ventricular ejection fraction, by estimation, is 60 to 65%. The left ventricle has normal function. The left ventricle has no regional wall motion abnormalities. There is moderate concentric left ventricular hypertrophy. Left ventricular diastolic parameters are indeterminate.  2. Right ventricular systolic function is normal. The right ventricular size is normal. There is normal pulmonary artery systolic pressure.  3. Left atrial size was severely dilated.  4. Right atrial size was severely dilated.  5. The mitral valve is normal in structure. No evidence of mitral valve regurgitation. No evidence of mitral stenosis.  6. The aortic valve is normal in structure. Aortic valve regurgitation is not visualized. No aortic stenosis is present.  7. There is borderline dilatation of the ascending aorta, measuring 38 mm.  8. The inferior vena cava is normal in size with greater than 50% respiratory variability, suggesting right atrial pressure of 3 mmHg. FINDINGS  Left Ventricle: Left ventricular ejection fraction, by estimation, is 60 to 65%. The left ventricle has normal  function. The left ventricle has no regional wall motion abnormalities. The left ventricular internal cavity size was normal in size. There is  moderate concentric left ventricular hypertrophy. Left ventricular diastolic parameters are indeterminate. Right Ventricle: The right ventricular size is normal. No increase in right ventricular wall thickness. Right ventricular systolic function is normal. There is normal pulmonary artery systolic pressure. The tricuspid regurgitant velocity is 2.84 m/s, and  with an assumed right atrial pressure of 3 mmHg, the estimated right ventricular systolic pressure is [id number] mmHg. Left Atrium: Left atrial size was severely dilated. Right Atrium: Right atrial size was severely dilated. Pericardium: There is no evidence of pericardial effusion. Mitral Valve: The mitral valve is normal in structure. Mild mitral annular calcification. No evidence of mitral valve regurgitation. No evidence of mitral valve stenosis. Tricuspid Valve: The tricuspid valve is normal in structure. Tricuspid valve regurgitation is trivial. No evidence of tricuspid stenosis. Aortic Valve: The aortic valve is normal in structure. Aortic valve regurgitation is not visualized. No aortic stenosis is present. Aortic valve mean gradient measures 6.0 mmHg. Aortic valve peak gradient measures 10.8 mmHg. Aortic valve area, by VTI measures 3.56 cm. Pulmonic Valve: The pulmonic valve was normal in structure. Pulmonic valve regurgitation is trivial. No evidence of pulmonic stenosis. Aorta: The aortic root is normal in size and structure. There is borderline dilatation of the ascending aorta, measuring 38 mm. Venous: The inferior vena cava is normal in size with greater than 50% respiratory variability, suggesting right atrial pressure  of 3 mmHg. IAS/Shunts: No atrial level shunt detected by color flow Doppler.  LEFT VENTRICLE PLAX 2D LVIDd:         5.10 cm  Diastology LVIDs:         3.80 cm  LV e' medial:    7.94 cm/s LV PW:          1.50 cm  LV E/e' medial:  10.8 LV IVS:        1.50 cm  LV e' lateral:   9.46 cm/s LVOT diam:     2.10 cm  LV E/e' lateral: 9.1 LV SV:         117 LV SV Index:   52 LVOT Area:     3.46 cm  RIGHT VENTRICLE RV Basal diam:  4.70 cm RV Mid diam:    4.30 cm LEFT ATRIUM              Index       RIGHT ATRIUM           Index LA diam:        5.00 cm  2.22 cm/m  RA Area:     31.10 cm LA Vol (A2C):   82.9 ml  36.78 ml/m RA Volume:   117.00 ml 51.91 ml/m LA Vol (A4C):   111.0 ml 49.24 ml/m LA Biplane Vol: 105.0 ml 46.58 ml/m  AORTIC VALVE AV Area (Vmax):    3.42 cm AV Area (Vmean):   3.31 cm AV Area (VTI):     3.56 cm AV Vmax:           164.00 cm/s AV Vmean:          115.000 cm/s AV VTI:            0.328 m AV Peak Grad:      10.8 mmHg AV Mean Grad:      6.0 mmHg LVOT Vmax:         162.00 cm/s LVOT Vmean:        110.000 cm/s LVOT VTI:          0.337 m LVOT/AV VTI ratio: 1.03  AORTA Ao Root diam: 3.30 cm Ao Asc diam:  3.80 cm MITRAL VALVE               TRICUSPID VALVE MV Area (PHT): 3.65 cm    TR Peak grad:   32.3 mmHg MV Decel Time: 208 msec    TR Vmax:        284.00 cm/s MV E velocity: 85.70 cm/s MV A velocity: 75.40 cm/s  SHUNTS MV E/A ratio:  1.14        Systemic VTI:  0.34 m                            Systemic Diam: 2.10 cm Dani Gobble Croitoru MD Electronically signed by Sanda Klein MD Signature Date/Time: 05/28/2021/4:52:34 PM    Final    Disposition   Pt is being discharged home today in good condition.  Follow-up Plans & Appointments     Follow-up Information     Verta Ellen., NP Follow up on 06/09/2021.   Specialty: Cardiology Why: at 10am for your follow up appt with Dr. Nelly Laurence NP Celene Squibb information: [place] [id number] [phone]                Discharge Instructions     Call MD for:  difficulty breathing, headache or visual disturbances   Complete by: As directed    Call MD for:  persistant dizziness or light-headedness   Complete  by: As directed    Call MD for:  redness, tenderness, or signs of infection (pain, swelling, redness, odor or green/yellow discharge around incision site)   Complete by: As directed    Diet - low sodium heart healthy   Complete by: As directed    Discharge instructions   Complete by: As directed    Radial Site Care Refer to this sheet in the next few weeks. These instructions provide you with information on caring for yourself after your procedure. Your caregiver may also give you more specific instructions. Your treatment has been planned according to current medical practices, but problems sometimes occur. Call your caregiver if you have any problems or questions after your procedure. HOME CARE INSTRUCTIONS You may shower the day after the procedure. Remove the bandage (dressing) and gently wash the site with plain soap and water. Gently pat the site dry.  Do not apply powder or lotion to the site.  Do not submerge the affected site in water for 3 to 5 days.  Inspect the site at least twice daily.  Do not flex or bend the affected arm for 24 hours.  No lifting over 5 pounds (2.3 kg) for 5 days after your procedure.  Do not drive home if you are discharged the same day of the procedure. Have someone else drive you.  You may drive 24 hours after the procedure unless otherwise instructed by your caregiver.  What to expect: Any bruising will usually fade within 1 to 2 weeks.  Blood that collects in the tissue (hematoma) may be painful to the touch. It should usually decrease in size and tenderness within 1 to 2 weeks.  SEEK IMMEDIATE MEDICAL CARE IF: You have unusual pain at the radial site.  You have redness, warmth, swelling, or pain at the radial site.  You have drainage (other than a small amount of blood on the dressing).  You have chills.  You have a fever or persistent symptoms for more than 72 hours.  You have a fever and your symptoms suddenly get worse.  Your arm becomes pale,  cool, tingly, or numb.  You have heavy bleeding from the site. Hold pressure on the site.   PLEASE DO NOT MISS ANY DOSES OF YOUR BRILINTA!!!!! Also keep a log of you blood pressures and bring back to your follow up appt. Please call the office with any questions.   Patients taking blood thinners should generally stay away from medicines like ibuprofen, Advil, Motrin, naproxen, and Aleve due to risk of stomach bleeding. You may take Tylenol as directed or talk to your primary doctor about alternatives.  PLEASE ENSURE THAT YOU DO NOT RUN OUT OF YOUR BRILINTA. This medication is very important to remain on for at least one year. IF you have issues obtaining this medication due to cost please CALL the office 3-5 business days prior to running out in order to prevent missing doses of this medication.   Increase activity slowly   Complete by: As directed        Discharge Medications   Allergies as of 05/31/2021       Reactions   Lipitor [atorvastatin]    Pain in neck and legs after taking medication        Medication List     TAKE these medications    aspirin  EC 81 MG tablet Take 1 tablet (81 mg total) by mouth daily. Swallow whole.   carvedilol 3.125 MG tablet Commonly known as: COREG Take 2 tablets (6.25 mg total) by mouth 2 (two) times daily with a meal. What changed: how much to take   citalopram 40 MG tablet Commonly known as: CELEXA Take 0.5 tablets (20 mg total) by mouth daily. What changed: how much to take   fish oil-omega-3 fatty acids 1000 MG capsule Take 1 g by mouth daily.   losartan-hydrochlorothiazide 100-12.5 MG tablet Commonly known as: HYZAAR Take 1 tablet by mouth daily.   nitroGLYCERIN 0.4 MG SL tablet Commonly known as: NITROSTAT Place 0.4 mg under the tongue every 5 (five) minutes as needed for chest pain.   potassium chloride SA 20 MEQ tablet Commonly known as: KLOR-CON Take 20 mEq by mouth daily.   rosuvastatin 20 MG tablet Commonly known  as: CRESTOR Take 1 tablet (20 mg total) by mouth daily.   ticagrelor 90 MG Tabs tablet Commonly known as: BRILINTA Take 1 tablet (90 mg total) by mouth 2 (two) times daily.   Vitamin D3 25 MCG (1000 UT) Caps Take 1 capsule by mouth daily.        Outstanding Labs/Studies   BMET at follow up appt  Duration of Discharge Encounter   Greater than 30 minutes including physician time.  Signed, Reino Bellis, NP 05/31/2021, 9:10 AM

## 2021-06-02 ENCOUNTER — Other Ambulatory Visit: Payer: Self-pay

## 2021-06-02 ENCOUNTER — Telehealth: Payer: Self-pay | Admitting: Cardiology

## 2021-06-02 ENCOUNTER — Telehealth: Payer: Self-pay | Admitting: Home Health

## 2021-06-02 MED ORDER — TICAGRELOR 90 MG PO TABS: 90.0000 mg | ORAL_TABLET | Freq: Two times a day (BID) | ORAL | 11 refills | Status: DC

## 2021-06-02 NOTE — Telephone Encounter (Signed)
New Message:     Patient said he was in the hospital last Sunday  until this Wednesday(05-31-21). He said he would like to talk to Dr Ellyn Hack today if possible please. He said if he could not talk to Dr Ellyn Hack, he would talk to his nurse.He is having  some problem.    Pt c/o Shortness Of Breath: STAT if SOB developed within the last 24 hours or pt is noticeably SOB on the phone  1. Are you currently SOB (can you hear that pt is SOB on the phone)? Not at this time  2. How long have you been experiencing SOB? Started after he was discharged   3. Are you SOB when sitting or when up moving around? Only when he moves around  4. Are you currently experiencing any other symptoms? no

## 2021-06-02 NOTE — Telephone Encounter (Signed)
Patient called after hour line reporting SOB with walking, called back at [phone], speak directly to the patient, who reports he was recently released from the hospital 05/31/2021 after cardiac stent placement.  He states he did not do much of activity while he was in the hospital.  He noted slight shortness of breath when he ambulates, states symptoms is mild, but he is able to walk long distance if he wants to.  He denied any resting or exertional chest pain, dizziness, syncope.  He has been taking his medications religiously.  He checked his blood pressure which was [id number] systolically heart rate was around 60s.  He denies any peripheral edema, is urinating without problem.  Suspecting patient has mild deconditioning from recent hospitalization.  Asked patient to progress activities slowly as tolerated.  Continue monitor his symptoms, blood pressure, heart rate, call back if symptoms reoccurs and become worsened.  Otherwise follow-up with our office on 06/09/2021 as scheduled.  All questions answered to satisfaction.  Patient is agreeable with above plan.

## 2021-06-02 NOTE — Telephone Encounter (Signed)
*  STAT* If patient is at the pharmacy, call can be transferred to refill team.   1. Which medications need to be refilled? (please list name of each medication and dose if known) new prescription fo Brilinta  2. Which pharmacy/location (including street and city if local pharmacy) is medication to be sent to[place] [facility] [place]  3. Do they need a 30 day or 90 day supply? 30 days and refills

## 2021-06-06 ENCOUNTER — Telehealth: Payer: Self-pay | Admitting: Family Medicine

## 2021-06-06 NOTE — Telephone Encounter (Signed)
Patient had procedure on 9-6 States that he is having some shortness of breath when he starts walking and finishes with his walk. States that it is not too bad but he was concerned.

## 2021-06-07 ENCOUNTER — Encounter: Payer: Self-pay | Admitting: *Deleted

## 2021-06-07 ENCOUNTER — Other Ambulatory Visit: Payer: Self-pay | Admitting: *Deleted

## 2021-06-07 NOTE — Telephone Encounter (Signed)
Pt notified of plan of care and will follow up next visit.

## 2021-06-07 NOTE — Patient Outreach (Signed)
[facility]) Care Management  06/07/2021  DEWEL GAVILANES June 16, 1956 [id number]   Telephone Assessment-Successful-Enrolled (OTHER-stent replacement)  Spoke with pt today concerning his recent Call-A-RN call to [facility]. Once verified pt reports he has spoken with his provider's office concerning his recent issues with SOB related to his recent stent replacements. Pt pending an office visit on Friday this week with his CAD provider. Verified his SOB is improving and the provider has informed him the his recent symptoms should improve over a period of time. Pt remains aware of what to do if acute needs arise.    RN explained [facility] services and extended enrollment into a program for services. Pt receptive and completed the initial assessment. Pt recent underwent a stent replacement and continue to  do well. Pt has sufficient transportation and RN reviewed all prescribed medications. Generated  plan of care that was discussed with the pt. Encourage adherence with all interventions and goals discussed today.   Will send [facility] packet on services available and notify pt's provider of his disposition with [facility] services.  Will follow up in a few weeks on pt's ongoing management of care. No other needs presented at this time,   Goals Addressed             This Visit's Progress    THN-Make and Keep All Appointments       Timeframe:  Short-Term Goal Priority:  Medium Start Date:   06/07/2021                          Expected End Date:   07/24/2021                   Follow Up Date 07/04/1021    - arrange a ride through an agency 1 week before appointment - ask family or friend for a ride - call to cancel if needed - keep a calendar with prescription refill dates - keep a calendar with appointment dates  Barriers: Health Behaviors     Why is this important?   Part of staying healthy is seeing the doctor for follow-up care.  If you forget your appointments, there are some things  you can do to stay on track.    Notes:  9/14-Verified pt has sufficient transportation to all medical appointments. Will encourage pt to adherence to all post-op scheduled appointments. Also offered another source of transportation if needed in the future with [facility] care-guides if available in his area.     THN-Mat[place]ain My Quality of Life       Timeframe:  Long-Range Goal Priority:  Medium Start Date:   06/07/2021                          Expected End Date:  09/22/2021                     Follow Up Date 07/04/2021    - do one enjoyable thing every day - learn something new by asking, reading and searching the Internet every day - make shared treatment decisions with doctor - name a health care proxy (decision maker) - spend time outdoors at least 3 times a week  Barriers: Health Behaviors Knowledge     Why is this important?   Having a long-term illness can be scary.  It can also be stressful for you and your caregiver.  These steps may help.  Notes:  9/14-Discussed and encouraged around improving his quality of life with his available family members (sister and brother). Discussed the above options to improve pt's quality of life and the importance of his ongoing recovery.         Raina Mina, RN Care Management Coordinator [place] Office [phone]

## 2021-06-07 NOTE — Telephone Encounter (Signed)
Spoke with pt who states that he wants Dr. Harl Bowie to know that he is still having SOB at times when he finishes walking. Please advise.

## 2021-06-07 NOTE — Telephone Encounter (Signed)
Can take some time to build back up endurance after the procedure, would continue exercising as tolerated and reassess symptoims in detail at his upcomiing appt   J Aeden Matranga MD

## 2021-06-08 NOTE — Progress Notes (Signed)
Cardiology Office Note  Date: 06/09/2021   ID: Stephen Graves, DOB Aug 23, 1956, MRN [id number]  PCP:  [facility]  Cardiologist:  Carlyle Dolly, MD Electrophysiologist:  None   Chief Complaint: Follow-up PCI  History of Present Illness: Stephen Graves is a 65 y.o. male with a history of CAD, HLD, HTN.  Prior history of DES to mid distal LAD October 2018.  Small septal branch jailed.  He was last seen by Dr. Harl Bowie on 05/23/2021.  History of chronic chest pain unchanged.  Often starts in the back and radiates upward.  No shortness of breath or DOE.  Compliant with medications.  Home blood pressures in the 110 over 60s range.  Occasional low systolic blood pressures in the 90s.  History of hyperlipidemia with labs followed by PCP.  Currently on Crestor.  History of lightheadedness only occurring with standing.  He had no CAD symptoms during that visit.  He was continuing current medications.  EKG showed normal sinus rhythm with no acute ischemic changes.  Labs were requested from PCP.  He was to continue statin medication.  Home blood pressures were at goal.  He reported orthostatic dizziness.  He was orthostatic by systolic blood pressure with BP decreasing 23 points with lying to sitting.  Decreasing 15 points with sitting to standing.  His Hyzaar was decreased to 100/12.5 and he was encouraged to increase oral hydration.  He presented to [facility] ED  05/31/2021 with midsternal chest pain which began around 0100 a.m.  He had taken to nitroglycerin sublingual which helped with pain.  He went to sleep and woke up at 04 30 secondary to recurrent chest pain and took 2 more sublingual nitroglycerin.  He reported radiation to back and left arm.  Initial troponin was 12 subsequent troponin was 15.  EKG normal sinus rhythm with occasional PVCs, rate of 65.  No acute ischemic changes.  He was transferred to [facility].  He underwent cardiac catheterization revealing proximal to  mid LAD after D1 demonstrated a long 50% stenosis with 100% likely CTO after SP2 prior to previously placed distal LAD stent.  Successful CTO PTCA/DES PCI of proximal to distal LAD just distal to D1 overlapping previously placed stent restoring TIMI-3 flow.  Ostial RI 60% and proximal RI 60%, 75% focal stenosis of small nondominant RCA.  Very large caliber and dominant LCx with diffuse 20 to 30% stenosis in the PDA.  Recommendations DAPT therapy or episode x1 year then lifelong Thienopyridine.  He was discharged on DAPT therapy with aspirin and Brilinta for 1 year, likely lifelong given extensive stenting in the LAD.  He was seen by cardiac rehab.  He was to continue Coreg 6.25 mg p.o. twice daily, Crestor 20 mg daily, losartan 100 mg/HCTZ 12.5 daily, Lovasa.  LDL was 45.  He was to continue Crestor 20 mg daily.  His hemoglobin A1c was 6.4%.  To consider addition of SGLT2 as outpatient pending cost.  Received pre and post cath hydration.  Creatinine was stable at 1.3.  Baseline appears to be around 1.2-1.5.  His Celexa was reduced from 40 mg to 20 mg on admission due to prolonged QTC  He is here status post PCI.  Had a cardiac catheterization on   Past Medical History:  Diagnosis Date   Anxiety    Coronary artery disease involving native coronary 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 ([place]) 07/18/2017   Cardiac cath 1020 1418: Mid LAD to Dist LAD lesion,  99 %stenosed. - PCI with resolute DES (2.5 X 26 mm Resolute Onyx stent - 2.75 mm);ostial LAD 30%. Ostial ramus 40%. Proximal to mid LAD 50%. Normal LV function and normal LVEDP.    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   Skull fracture [facility])     Past Surgical History:  Procedure Laterality Date   CORONARY STENT INTERVENTION N/A 07/17/2017   Procedure: CORONARY STENT INTERVENTION;  Surgeon: Wellington Hampshire, MD;  Location: [place] [facility];  Service:  Cardiovascular;  Laterality: N/A;   CORONARY STENT INTERVENTION N/A 05/30/2021   Procedure: CORONARY STENT INTERVENTION;  Surgeon: Leonie Man, MD;  Location: [place] [facility];  Service: Cardiovascular;  Laterality: N/A;   FOOT SURGERY Left    KNEE ARTHROPLASTY Left 07/30/2018   Procedure: LEFT TOTAL KNEE ARTHROPLASTY WITH COMPUTER NAVIGATION;  Surgeon: Rod Can, MD;  Location: [facility];  Service: Orthopedics;  Laterality: Left;  Needs RNFA   KNEE ARTHROPLASTY Right 11/02/2020   Procedure: COMPUTER ASSISTED TOTAL KNEE ARTHROPLASTY;  Surgeon: Rod Can, MD;  Location: [facility];  Service: Orthopedics;  Laterality: Right;  185mn   LEFT HEART CATH AND CORONARY ANGIOGRAPHY N/A 07/17/2017   Procedure: LEFT HEART CATH AND CORONARY ANGIOGRAPHY;  Surgeon: AWellington Hampshire MD;  [place] [facility];  Service: Cardiovascular;  Laterality: N/A;   LEFT HEART CATH AND CORONARY ANGIOGRAPHY N/A 05/30/2021   Procedure: LEFT HEART CATH AND CORONARY ANGIOGRAPHY;  Surgeon: HLeonie Man MD;  [place];  Service: Cardiovascular;  Laterality: N/A;   left wrist surgery     SKIN GRAFT  1998   TONSILLECTOMY      Current Outpatient Medications  Medication Sig Dispense Refill   aspirin EC 81 MG tablet Take 1 tablet (81 mg total) by mouth daily. Swallow whole. 90 tablet 3   carvedilol (COREG) 3.125 MG tablet Take 2 tablets (6.25 mg total) by mouth 2 (two) times daily with a meal. 301 tablet 11   Cholecalciferol (VITAMIN D3) 25 MCG (1000 UT) CAPS Take 1 capsule by mouth daily.     citalopram (CELEXA) 40 MG tablet Take 0.5 tablets (20 mg total) by mouth daily. 30 tablet 11   fish oil-omega-3 fatty acids 1000 MG capsule Take 1 g by mouth daily.     losartan-hydrochlorothiazide (HYZAAR) 100-12.5 MG tablet Take 1 tablet by mouth daily. 90 tablet 3   nitroGLYCERIN (NITROSTAT) 0.4 MG SL tablet DISSOLVE ONE TABLET UNDER THE TONGUE EVERY 5 MINUTES AS NEEDED FOR CHEST PAIN.  DO NOT  EXCEED A TOTAL OF 3 DOSES IN 15 MINUTES 25 tablet 2   potassium chloride SA (K-DUR,KLOR-CON) 20 MEQ tablet Take 20 mEq by mouth daily.     rosuvastatin (CRESTOR) 20 MG tablet Take 1 tablet (20 mg total) by mouth daily. 90 tablet 3   ticagrelor (BRILINTA) 90 MG TABS tablet Take 1 tablet (90 mg total) by mouth 2 (two) times daily. 60 tablet 11   No current facility-administered medications for this visit.   Allergies:  Lipitor [atorvastatin]   Social History: The patient  reports that he quit smoking about 32 years ago. His smoking use included cigarettes. He has never used smokeless tobacco. He reports that he does not drink alcohol and does not use drugs.   Family History: The patient's family history includes CAD in his mother; Heart attack (age of onset:  2) in his father; Prostate cancer in his brother; Stroke (age of onset: 45) in his father; Thyroid nodules in his brother.   ROS:  Please see the history of present illness. Otherwise, complete review of systems is positive for none.  All other systems are reviewed and negative.   Physical Exam: VS:  BP (!) 92/58   Pulse 70   Ht '5\' 11"'$  (1.803 m)   Wt 229 lb (103.9 kg)   SpO2 97%   BMI 31.94 kg/m , BMI Body mass index is 31.94 kg/m.  Wt Readings from Last 3 Encounters:  06/09/21 229 lb (103.9 kg)  05/31/21 238 lb 15.7 oz (108.4 kg)  05/23/21 234 lb (106.1 kg)    General: Patient appears comfortable at rest. Neck: Supple, no elevated JVP or carotid bruits, no thyromegaly. Lungs: Clear to auscultation, nonlabored breathing at rest. Cardiac: Regular rate and rhythm, no S3 or significant systolic murmur, no pericardial rub. Extremities: No pitting edema, distal pulses 2+. Skin: Warm and dry. Musculoskeletal: No kyphosis. Neuropsychiatric: Alert and oriented x3, affect grossly appropriate.  ECG:    Recent Labwork: 10/25/2020: ALT 17; AST 20 05/31/2021: BUN 16; Creatinine, Ser 1.43; Hemoglobin 10.0; Platelets 79; Potassium 3.8;  Sodium 135     Component Value Date/Time   CHOL 107 05/29/2021 0059   TRIG 102 05/29/2021 0059   HDL 42 05/29/2021 0059   CHOLHDL 2.5 05/29/2021 0059   VLDL 20 05/29/2021 0059   LDLCALC 45 05/29/2021 0059    Other Studies Reviewed Today:    Cath: 2021/06/08   Ost LAD to Prox LAD lesion is 30% stenosed.   CULPRIT LESION SEGMENT: Mid LAD-1 lesion is 55% stenosed & Mid LAD-2 lesion is 100% stenosed.-Appears to be chronically occluded (with negative troponins at least greater than 1 week)   Mid LAD to Dist LAD stent is 100% stenosed. ->   Balloon angioplasty was performed using a 2.25 x 20 mm Sapphire balloon to restore TIMI-3 flow from the occlusion site to the previously placed stent..   A drug-eluting stent was successfully placed covering the entire segment of 2 and overlapping the previously placed stent, using a SYNERGY XD 2.50X48.  Postdilated to 2.85 mm including the previously placed stent.   Post intervention, there is a 0% residual stenosis in the entire mid LAD lesions extending into the previously placed mid-distal LAD stent.[name]   -----------------------------------------   Ramus-1 lesion is 60% stenosed.  Ramus-2 lesion is 60% stenosed.   RV Branch lesion is 75% stenosed.   ------------------------------------------.   LV end diastolic pressure is mildly elevated.   There is no aortic valve stenosis.   SUMMARY Left Dominant system with 3-vessel CAD: Proximal to mid LAD after D1 long 50% stenosis with 100% likely CTO after SP2 prior to previously placed distal LAD stent. Successful CTO PTCA/DES PCI of proximal to distal LAD from just distal to D1 overlapping previously placed stent restoring TIMI-3 flow (Synergy XD DES 2.5 mm x 48 mm postdilated to 2.85 mm)  Ostial RI 60% and proximal RI 60% 75% focal stenosis of small nondominant RCA Very large caliber, dominant LCx with diffuse 20 to 30% stenosis in the PDA LVEDP 18 mmHg.   RECOMMENDATIONS Wean off nitroglycerin infusion  overnight, titrate antihypertensives while inpatient-anticipate discharge in the morning. [place] 1 consult DAPT uninterrupted x1 year and then lifelong Thienopyridine.       Glenetta Hew, MD     Diagnostic Dominance: Left Intervention     Echo: 05/28/21   IMPRESSIONS  1. Left ventricular ejection fraction, by estimation, is 60 to 65%. The  left ventricle has normal function. The left ventricle has no regional  wall motion abnormalities. There is moderate concentric left ventricular  hypertrophy. Left ventricular  diastolic parameters are indeterminate.   2. Right ventricular systolic function is normal. The right ventricular  size is normal. There is normal pulmonary artery systolic pressure.   3. Left atrial size was severely dilated.   4. Right atrial size was severely dilated.   5. The mitral valve is normal in structure. No evidence of mitral valve  regurgitation. No evidence of mitral stenosis.   6. The aortic valve is normal in structure. Aortic valve regurgitation is  not visualized. No aortic stenosis is present.   7. There is borderline dilatation of the ascending aorta, measuring 38  mm.   8. The inferior vena cava is normal in size with greater than 50%  respiratory variability, suggesting right atrial pressure of 3 mmHg.   FINDINGS   Left Ventricle: Left ventricular ejection fraction, by estimation, is 60  to 65%. The left ventricle has normal function. The left ventricle has no  regional wall motion abnormalities. The left ventricular internal cavity  size was normal in size. There is   moderate concentric left ventricular hypertrophy. Left ventricular  diastolic parameters are indeterminate.   Right Ventricle: The right ventricular size is normal. No increase in  right ventricular wall thickness. Right ventricular systolic function is  normal. There is normal pulmonary artery systolic pressure. The tricuspid  regurgitant velocity is 2.84 m/s, and   with an  assumed right atrial pressure of 3 mmHg, the estimated right  ventricular systolic pressure is [id number] mmHg.   Left Atrium: Left atrial size was severely dilated.   Right Atrium: Right atrial size was severely dilated.   Pericardium: There is no evidence of pericardial effusion.   Mitral Valve: The mitral valve is normal in structure. Mild mitral annular  calcification. No evidence of mitral valve regurgitation. No evidence of  mitral valve stenosis.   Tricuspid Valve: The tricuspid valve is normal in structure. Tricuspid  valve regurgitation is trivial. No evidence of tricuspid stenosis.   Aortic Valve: The aortic valve is normal in structure. Aortic valve  regurgitation is not visualized. No aortic stenosis is present. Aortic  valve mean gradient measures 6.0 mmHg. Aortic valve peak gradient measures  10.8 mmHg. Aortic valve area, by VTI  measures 3.56 cm.   Pulmonic Valve: The pulmonic valve was normal in structure. Pulmonic valve  regurgitation is trivial. No evidence of pulmonic stenosis.   Aorta: The aortic root is normal in size and structure. There is  borderline dilatation of the ascending aorta, measuring 38 mm.   Venous: The inferior vena cava is normal in size with greater than 50%  respiratory variability, suggesting right atrial pressure of 3 mmHg.   IAS/Shunts: No atrial level shunt detected by color flow Doppler.  _____________    Diagnostic Studies Echocardiogram: 06/2017 Study Conclusions   - Left ventricle: The cavity size was normal. Wall thickness was    increased in a pattern of mild LVH. Systolic function was normal.    The estimated ejection fraction was in the range of 60% to 65%.    Wall motion was normal; there were no regional wall motion    abnormalities. Diastolic dysfunction, grade indeterminate.  - Aortic valve: Moderately calcified annulus. Trileaflet.    Cardiac Catheterization: 06/2017 The left ventricular systolic function is  normal.  LV end diastolic pressure is normal. The left ventricular ejection fraction is 55-65% by visual estimate. Ost LAD lesion, 30 %stenosed. Ost Ramus to Ramus lesion, 40 %stenosed. Prox LAD to Mid LAD lesion, 50 %stenosed. A drug eluting stent was successfully placed. Mid LAD to Dist LAD lesion, 99 %stenosed. Post intervention, there is a 0% residual stenosis.   1.  Severe one-vessel coronary artery disease with 99% stenosis in the mid to distal LAD. 2.  Normal LV systolic function and normal left ventricular end-diastolic pressure. 3.  Successful angioplasty and drug-eluting stent placement to the mid/distal LAD.  A small septal branch was jailed by the stent with sluggish flow.  This resulted in chest pain and was treated with nitroglycerin drip.   Post cath Recommendations: Dual antiplatelet therapy for at least one year.  Aggressive treatment of risk factors.  Continue nitroglycerin drip as needed for chest pain.      Assessment and Plan:  1. Status post cardiac catheterization   2. CAD in native artery   3. Essential hypertension   4. Mixed hyperlipidemia   5. Pre-diabetes    1. Status post cardiac catheterization Cardiac catheterization 05/30/2021 demonstrating proximal mid LAD after D1 long 50% stenosis 100% likely CTO after SP tube prior to previously placed distal LAD stent.  Successful CTO/PTCA/DES proximal to distal LAD from just distal to D1 overlapping previously placed stent restoring TIMI-3 flow.  Had ostial ramus intermedius 60% and proximal ramus intermedius 60%.  75% focal stenosis of nondominant RCA.  Very large caliber dominant LCx with diffuse 20 to 30% stenosis in the PDA.  Right radial access clean and dry with bruising and 2+ pulses.  2. CAD in native artery Currently denies any anginal or exertional symptoms.  He is tolerating all of his medications well.  Continue aspirin 81 mg daily, carvedilol 6.25 mg p.o. twice daily.  Brilinta 90 mg p.o. twice daily.   Denies any bleeding on DAPT therapy.  Continue sublingual nitroglycerin.  3. Essential hypertension Blood pressure is low today but he is asymptomatic.  He brings with him a log of blood pressures which appear to be predominantly in the low 100s over 60s to 70s.  Continue Hyzaar 100/12.5 mg p.o. daily.  4. Mixed hyperlipidemia Continue Crestor 20 mg daily.  Recent lipid panel 05/29/2021 total cholesterol 107, triglycerides 102, HDL 42, LDL 45.  5. Pre-diabetes Recent hemoglobin A1c 6.4%.  Currently on no antihyperglycemic medication.  Follow with PCP for suspected diabetes.  Medication Adjustments/Labs and Tests Ordered: Current medicines are reviewed at length with the patient today.  Concerns regarding medicines are outlined above.   Disposition: Follow-up with Dr. Harl Bowie or APP 6 months.  Signed, Levell July, NP 06/09/2021 10:58 AM    [facility] [place], [place], [place] [address] Phone: [phone]; Fax: [phone]

## 2021-06-09 ENCOUNTER — Encounter: Payer: Self-pay | Admitting: Family Medicine

## 2021-06-09 ENCOUNTER — Ambulatory Visit (INDEPENDENT_AMBULATORY_CARE_PROVIDER_SITE_OTHER): Payer: Medicare HMO | Admitting: Family Medicine

## 2021-06-09 ENCOUNTER — Other Ambulatory Visit: Payer: Self-pay

## 2021-06-09 VITALS — BP 92/58 | HR 70 | Ht 71.0 in | Wt 229.0 lb

## 2021-06-09 DIAGNOSIS — R7303 Prediabetes: Secondary | ICD-10-CM

## 2021-06-09 DIAGNOSIS — I2510 Atherosclerotic heart disease of native coronary artery without angina pectoris: Secondary | ICD-10-CM | POA: Diagnosis not present

## 2021-06-09 DIAGNOSIS — Z9889 Other specified postprocedural states: Secondary | ICD-10-CM | POA: Diagnosis not present

## 2021-06-09 DIAGNOSIS — E782 Mixed hyperlipidemia: Secondary | ICD-10-CM

## 2021-06-09 DIAGNOSIS — I10 Essential (primary) hypertension: Secondary | ICD-10-CM

## 2021-06-09 NOTE — Patient Instructions (Signed)
Medication Instructions:  Your physician recommends that you continue on your current medications as directed. Please refer to the Current Medication list given to you today.  *If you need a refill on your cardiac medications before your next appointment, please call your pharmacy*   Lab Work: None today  If you have labs (blood work) drawn today and your tests are completely normal, you will receive your results only by: MyChart Message (if you have MyChart) OR A paper copy in the mail If you have any lab test that is abnormal or we need to change your treatment, we will call you to review the results.   Testing/Procedures: None today    Follow-Up: At [facility], you and your health needs are our priority.  As part of our continuing mission to provide you with exceptional heart care, we have created designated Provider Care Teams.  These Care Teams include your primary Cardiologist (physician) and Advanced Practice Providers (APPs -  Physician Assistants and Nurse Practitioners) who all work together to provide you with the care you need, when you need it.  We recommend signing up for the patient portal called "MyChart".  Sign up information is provided on this After Visit Summary.  MyChart is used to connect with patients for Virtual Visits (Telemedicine).  Patients are able to view lab/test results, encounter notes, upcoming appointments, etc.  Non-urgent messages can be sent to your provider as well.   To learn more about what you can do with MyChart, go to [email or web address].com.    Your next appointment:   6 month(s)  The format for your next appointment:   In Person  Provider:   Andy Quinn, NP   Other Instructions None   

## 2021-06-12 ENCOUNTER — Other Ambulatory Visit: Payer: Self-pay

## 2021-06-12 ENCOUNTER — Telehealth: Payer: Self-pay | Admitting: Cardiology

## 2021-06-12 MED ORDER — ROSUVASTATIN CALCIUM 20 MG PO TABS: 20.0000 mg | ORAL_TABLET | Freq: Every day | ORAL | 3 refills | Status: DC

## 2021-06-12 NOTE — Telephone Encounter (Signed)
From cardiac perspective - should be fine to get these immunizations.  He may check with his pcp as well.

## 2021-06-12 NOTE — Telephone Encounter (Signed)
Refilled crestor 20 mg qd #90 to [facility] [place]

## 2021-06-12 NOTE — Telephone Encounter (Signed)
He is not on a standing dose of Imdur, so is OK to use Viagra -- I already told him this in the hospital.  He just has to acknowledge that if he has taken Viagra, he cannot take NTG SL within 24 hours of taking Viagra.  Glenetta Hew

## 2021-06-12 NOTE — Telephone Encounter (Signed)
New Message    Patient wants to know if its ok for him to get the flu shot and the covid booster

## 2021-06-12 NOTE — Telephone Encounter (Signed)
Spoke to patient. Patient states symptoms a become lightheaded has gotten better and no further  assistance is needed.   Patient did have 2 other question  1) Patient wanted to know when he can have the covid booster injection,he just had flu shot . RN informed patient possible. 2 weeks  but to contact primary   2 ) Could he use Viagra  now ?- patient does not needed a prescription,he just wants to know if he can use. RN informed patient will defer to Dr Ellyn Hack.  Patient does have a prescription for NTG.

## 2021-06-14 ENCOUNTER — Telehealth (HOSPITAL_COMMUNITY): Payer: Self-pay

## 2021-06-14 ENCOUNTER — Other Ambulatory Visit (HOSPITAL_COMMUNITY): Payer: Self-pay

## 2021-06-14 NOTE — Telephone Encounter (Signed)
Pharmacy Transitions of Care Follow-up Telephone Call  Date of discharge: 05/31/21  Discharge Diagnosis: stent placement  How have you been since you were released from the hospital?    Medication changes made at discharge:  - START: Brilinta  Medication changes verified by the patient? Yes    Medication Accessibility:  Home Pharmacy: not discussed   Was the patient provided with refills on discharged medications? Yes but discontinued for reorder to home pharmacy   Have all prescriptions been transferred from [facility] to home pharmacy? N/A   Is the patient able to afford medications? Has insurance    Medication Review:  TICAGRELOR (BRILINTA) Ticagrelor 90 mg BID initiated on 06/02/21.  - Educated patient on expected duration of therapy of aspirin with ticagrelor.  - Discussed importance of taking medication around the same time every day, - Advised patient of medications to avoid (NSAIDs, aspirin maintenance doses>100 mg daily) - Educated that Tylenol (acetaminophen) will be the preferred analgesic to prevent risk of bleeding  - Emphasized importance of monitoring for signs and symptoms of bleeding (abnormal bruising, prolonged bleeding, nose bleeds, bleeding from gums, discolored urine, black tarry stools)  - Educated patient to notify doctor if shortness of breath or abnormal heartbeat occur - Advised patient to alert all providers of antiplatelet therapy prior to starting a new medication or having a procedure   Follow-up Appointments:  PCP Hospital f/u appt confirmed?  None currently scheduled.  [facility] f/u appt confirmed?  Scheduled to see Dr. Leonides Sake on 06/09/21 @ Cardiology.   If their condition worsens, is the pt aware to call PCP or go to the Emergency Dept.? yes  Final Patient Assessment:

## 2021-06-15 NOTE — Telephone Encounter (Signed)
Spoke topatient . Patient is aware he may use Viagra but not use NTG in 24 hour prior or after use of medication  Patient verbalized understading.

## 2021-07-01 DIAGNOSIS — I10 Essential (primary) hypertension: Secondary | ICD-10-CM | POA: Diagnosis not present

## 2021-07-01 DIAGNOSIS — E669 Obesity, unspecified: Secondary | ICD-10-CM | POA: Diagnosis not present

## 2021-07-01 DIAGNOSIS — I25119 Atherosclerotic heart disease of native coronary artery with unspecified angina pectoris: Secondary | ICD-10-CM | POA: Diagnosis not present

## 2021-07-01 DIAGNOSIS — I739 Peripheral vascular disease, unspecified: Secondary | ICD-10-CM | POA: Diagnosis not present

## 2021-07-01 DIAGNOSIS — N529 Male erectile dysfunction, unspecified: Secondary | ICD-10-CM | POA: Diagnosis not present

## 2021-07-01 DIAGNOSIS — I951 Orthostatic hypotension: Secondary | ICD-10-CM | POA: Diagnosis not present

## 2021-07-01 DIAGNOSIS — M1990 Unspecified osteoarthritis, unspecified site: Secondary | ICD-10-CM | POA: Diagnosis not present

## 2021-07-01 DIAGNOSIS — R69 Illness, unspecified: Secondary | ICD-10-CM | POA: Diagnosis not present

## 2021-07-01 DIAGNOSIS — G8929 Other chronic pain: Secondary | ICD-10-CM | POA: Diagnosis not present

## 2021-07-01 DIAGNOSIS — E785 Hyperlipidemia, unspecified: Secondary | ICD-10-CM | POA: Diagnosis not present

## 2021-07-04 ENCOUNTER — Other Ambulatory Visit: Payer: Self-pay | Admitting: *Deleted

## 2021-07-04 NOTE — Patient Outreach (Signed)
[facility]) Care Management  07/04/2021  Stephen Graves 06-03-1956 [id number]  Telephone Assessment-Successful-OTHER ([place])  Pt reports issues with back pain and unable to relate to an incident. Denies any issues related to difficulty breathing or immobility. Pt actually indicates his breathing is "a lot better" with no issues. Offered if RN needed to contact his provider for more interventions however pt declined and indicated he would call if his back got worse.   Plan of care discussed with goals and interventions. All updated noted with the plan of care. Pt very receptive to ongoing follow up calls so RN will scheduled a follow up call next month for ongoing care management services.   Goals Addressed             This Visit's Progress    [facility]-Make and Keep All Appointments   On track    Timeframe:  Short-Term Goal Priority:  Medium Start Date:   06/07/2021                          Expected End Date:   07/24/2021                   Follow Up Date  08/03/1021    - arrange a ride through an agency 1 week before appointment - ask family or friend for a ride - call to cancel if needed - keep a calendar with prescription refill dates - keep a calendar with appointment dates  Barriers: Health Behaviors     Why is this important?   Part of staying healthy is seeing the doctor for follow-up care.  If you forget your appointments, there are some things you can do to stay on track.    Notes:  10/11-Pt continue to attend all medical appointments with no missed appointments. Pt verified ongoing transportation with no missed appointments. No new medications over the last month as pt remains adherence to all prescribed.  9/14-Verified pt has sufficient transportation to all medical appointments. Will encourage pt to adherence to all post-op scheduled appointments. Also offered another source of transportation if needed in the future with [facility] care-guides if  available in his area.     [facility]-Matintain My Quality of Life   On track    Timeframe:  Long-Range Goal Priority:  Medium Start Date:   06/07/2021                          Expected End Date:  09/22/2021                     Follow Up Date 08/03/2021    - do one enjoyable thing every day - learn something new by asking, reading and searching the Internet every day - make shared treatment decisions with doctor - name a health care proxy (decision maker) - spend time outdoors at least 3 times a week  Barriers: Health Behaviors Knowledge     Why is this important?   Having a long-term illness can be scary.  It can also be stressful for you and your caregiver.  These steps may help.    Notes:  9/14-Discussed and encouraged around improving his quality of life with his available family members (sister and brother). Discussed the above options to improve pt's quality of life and the importance of his ongoing recovery.         Raina Mina, RN  Care Management Coordinator [place] Office [phone]

## 2021-07-06 ENCOUNTER — Telehealth: Payer: Self-pay | Admitting: Cardiology

## 2021-07-06 MED ORDER — LOSARTAN POTASSIUM 50 MG PO TABS: 50.0000 mg | ORAL_TABLET | Freq: Every day | ORAL | 3 refills | Status: DC

## 2021-07-06 NOTE — Telephone Encounter (Signed)
   Are you dizzy now? no  Do you feel faint or have you passed out? States that approximately a week ago he almost fell because he was very lightheaded.   Do you have any other symptoms? No   Have you checked your HR and BP (record if available)? No patient states that since the stent he has had episodes of lightheadedness.    [phone]

## 2021-07-06 NOTE — Telephone Encounter (Signed)
Pt stated that since he has his stent placed he's having more lightheadedness and dizziness. Pt stated that he fell last night in his bathroom d/t being dizzy. Pt denies sob, cp, nausea, vomiting.   Please advise.

## 2021-07-06 NOTE — Telephone Encounter (Signed)
Stop hyzaar and start losartan 50mg  daily by itself. Update [place] on bps and dizziness on Monday. Needs to stay well hydrated.  J Bracken Moffa MD

## 2021-07-06 NOTE — Telephone Encounter (Signed)
Pt notified and voiced understanding. Medication list updated to reflect medication changes.

## 2021-07-10 ENCOUNTER — Telehealth: Payer: Self-pay | Admitting: Cardiology

## 2021-07-10 MED ORDER — LOSARTAN POTASSIUM 50 MG PO TABS: 25.0000 mg | ORAL_TABLET | Freq: Two times a day (BID) | ORAL | 3 refills | Status: DC

## 2021-07-10 NOTE — Telephone Encounter (Signed)
Some high bp's before meds, borderline low later in the day. Could he try taking his losartan 25mg  bid as opposded to 50mg  in AM. Could just take 1/2 tablet of what he already has. Update [place] again on Friday please   Zandra Abts MD

## 2021-07-10 NOTE — Telephone Encounter (Signed)
Pt notified and will update our office on Friday.

## 2021-07-10 NOTE — Telephone Encounter (Signed)
10/13 91/63 hr 68 10/14 112/89 hr 59 10/14 99/68 hr 59 10/15 118/ 76 hr58 10/15 163/111 < before meds   10/16 111/74 hr 58 10/17 165/98 hr 59 before meds

## 2021-07-10 NOTE — Telephone Encounter (Signed)
Pt states that he has not been a dizzy over the last few days. He reports that his dizziness comes and goes. Please advise.

## 2021-07-14 NOTE — Telephone Encounter (Signed)
Pt called with BP readings: Tuesday- 112/79 Wednesday- 118/82 Thursday- 124/84

## 2021-07-17 NOTE — Telephone Encounter (Signed)
BP's look good, would appear that breaking up the losartan seems to even things out better. If numbers remains consistent I would continue taking this way   Zandra Abts MD

## 2021-07-18 NOTE — Telephone Encounter (Signed)
Pt notified and verbalized understanding.

## 2021-08-03 ENCOUNTER — Other Ambulatory Visit: Payer: Self-pay | Admitting: *Deleted

## 2021-08-03 NOTE — Patient Outreach (Signed)
[facility]) Care Management  08/03/2021  Stephen Graves November 17, 1955 [id number]  Case Closure  Spoke with pt today and verified pt continue to do well with no acute issues reported. Informed pt that another case management services would be servicing him with this provider's office.   Pt aware to expect a call of inquiry and [facility] would no longer be involved. Pt verbalized an understanding.  Case Closed.  Raina Mina, RN Care Management Coordinator [place] Office [phone]

## 2021-08-21 DIAGNOSIS — M542 Cervicalgia: Secondary | ICD-10-CM | POA: Diagnosis not present

## 2021-08-21 DIAGNOSIS — Z6830 Body mass index (BMI) 30.0-30.9, adult: Secondary | ICD-10-CM | POA: Diagnosis not present

## 2021-08-21 DIAGNOSIS — I2510 Atherosclerotic heart disease of native coronary artery without angina pectoris: Secondary | ICD-10-CM | POA: Diagnosis not present

## 2021-08-21 DIAGNOSIS — Z1331 Encounter for screening for depression: Secondary | ICD-10-CM | POA: Diagnosis not present

## 2021-08-21 DIAGNOSIS — F419 Anxiety disorder, unspecified: Secondary | ICD-10-CM | POA: Diagnosis not present

## 2021-08-21 DIAGNOSIS — M170 Bilateral primary osteoarthritis of knee: Secondary | ICD-10-CM | POA: Diagnosis not present

## 2021-08-21 DIAGNOSIS — Z0001 Encounter for general adult medical examination with abnormal findings: Secondary | ICD-10-CM | POA: Diagnosis not present

## 2021-08-21 DIAGNOSIS — I10 Essential (primary) hypertension: Secondary | ICD-10-CM | POA: Diagnosis not present

## 2021-08-21 DIAGNOSIS — R69 Illness, unspecified: Secondary | ICD-10-CM | POA: Diagnosis not present

## 2021-08-21 DIAGNOSIS — E6609 Other obesity due to excess calories: Secondary | ICD-10-CM | POA: Diagnosis not present

## 2021-08-28 DIAGNOSIS — M542 Cervicalgia: Secondary | ICD-10-CM | POA: Diagnosis not present

## 2021-08-28 DIAGNOSIS — M545 Low back pain, unspecified: Secondary | ICD-10-CM | POA: Diagnosis not present

## 2021-09-04 DIAGNOSIS — M545 Low back pain, unspecified: Secondary | ICD-10-CM | POA: Diagnosis not present

## 2021-09-04 DIAGNOSIS — M542 Cervicalgia: Secondary | ICD-10-CM | POA: Diagnosis not present

## 2021-09-08 ENCOUNTER — Encounter (HOSPITAL_COMMUNITY): Payer: Self-pay | Admitting: *Deleted

## 2021-09-08 ENCOUNTER — Emergency Department (HOSPITAL_COMMUNITY)
Admission: EM | Admit: 2021-09-08 | Discharge: 2021-09-08 | Disposition: A | Discharge status: Home / Self Care | Payer: Medicare HMO | Attending: Emergency Medicine | Admitting: Emergency Medicine

## 2021-09-08 ENCOUNTER — Emergency Department (HOSPITAL_COMMUNITY): Payer: Medicare HMO

## 2021-09-08 DIAGNOSIS — M47812 Spondylosis without myelopathy or radiculopathy, cervical region: Secondary | ICD-10-CM | POA: Diagnosis not present

## 2021-09-08 DIAGNOSIS — Z7982 Long term (current) use of aspirin: Secondary | ICD-10-CM | POA: Insufficient documentation

## 2021-09-08 DIAGNOSIS — Z87891 Personal history of nicotine dependence: Secondary | ICD-10-CM | POA: Insufficient documentation

## 2021-09-08 DIAGNOSIS — I10 Essential (primary) hypertension: Secondary | ICD-10-CM | POA: Insufficient documentation

## 2021-09-08 DIAGNOSIS — Z96653 Presence of artificial knee joint, bilateral: Secondary | ICD-10-CM | POA: Insufficient documentation

## 2021-09-08 DIAGNOSIS — Z79899 Other long term (current) drug therapy: Secondary | ICD-10-CM | POA: Diagnosis not present

## 2021-09-08 DIAGNOSIS — I2510 Atherosclerotic heart disease of native coronary artery without angina pectoris: Secondary | ICD-10-CM | POA: Diagnosis not present

## 2021-09-08 DIAGNOSIS — S60512A Abrasion of left hand, initial encounter: Secondary | ICD-10-CM | POA: Insufficient documentation

## 2021-09-08 DIAGNOSIS — R55 Syncope and collapse: Secondary | ICD-10-CM | POA: Diagnosis not present

## 2021-09-08 DIAGNOSIS — S6992XA Unspecified injury of left wrist, hand and finger(s), initial encounter: Secondary | ICD-10-CM | POA: Diagnosis not present

## 2021-09-08 DIAGNOSIS — W19XXXA Unspecified fall, initial encounter: Secondary | ICD-10-CM | POA: Insufficient documentation

## 2021-09-08 DIAGNOSIS — S199XXA Unspecified injury of neck, initial encounter: Secondary | ICD-10-CM | POA: Diagnosis not present

## 2021-09-08 DIAGNOSIS — R42 Dizziness and giddiness: Secondary | ICD-10-CM | POA: Insufficient documentation

## 2021-09-08 LAB — URINALYSIS, ROUTINE W REFLEX MICROSCOPIC
Bacteria, UA: NONE SEEN
Bilirubin Urine: NEGATIVE
Glucose, UA: 50 mg/dL — AB
Ketones, ur: NEGATIVE mg/dL
Leukocytes,Ua: NEGATIVE
Nitrite: NEGATIVE
Protein, ur: 100 mg/dL — AB
Specific Gravity, Urine: 1.024 (ref 1.005–1.030)
pH: 6 (ref 5.0–8.0)

## 2021-09-08 LAB — CBC
HCT: 37.9 % — ABNORMAL LOW (ref 39.0–52.0)
Hemoglobin: 11.8 g/dL — ABNORMAL LOW (ref 13.0–17.0)
MCH: 28.6 pg (ref 26.0–34.0)
MCHC: 31.1 g/dL (ref 30.0–36.0)
MCV: 92 fL (ref 80.0–100.0)
Platelets: 120 10*3/uL — ABNORMAL LOW (ref 150–400)
RBC: 4.12 MIL/uL — ABNORMAL LOW (ref 4.22–5.81)
RDW: 15.6 % — ABNORMAL HIGH (ref 11.5–15.5)
WBC: 5.5 10*3/uL (ref 4.0–10.5)
nRBC: 0 % (ref 0.0–0.2)

## 2021-09-08 LAB — BASIC METABOLIC PANEL
Anion gap: 11 (ref 5–15)
BUN: 27 mg/dL — ABNORMAL HIGH (ref 8–23)
CO2: 20 mmol/L — ABNORMAL LOW (ref 22–32)
Calcium: 8.9 mg/dL (ref 8.9–10.3)
Chloride: 105 mmol/L (ref 98–111)
Creatinine, Ser: 1.53 mg/dL — ABNORMAL HIGH (ref 0.61–1.24)
GFR, Estimated: 50 mL/min — ABNORMAL LOW (ref >60)
Glucose, Bld: 110 mg/dL — ABNORMAL HIGH (ref 70–99)
Potassium: 4.1 mmol/L (ref 3.5–5.1)
Sodium: 136 mmol/L (ref 135–145)

## 2021-09-08 LAB — CBG MONITORING, ED: Glucose-Capillary: 117 mg/dL — ABNORMAL HIGH (ref 70–99)

## 2021-09-08 NOTE — ED Provider Notes (Signed)
[facility] EMERGENCY DEPARTMENT Provider Note  CSN: [id number] Arrival date & time: 09/08/21 1500    History Chief Complaint  Patient presents with   Near Syncope    Stephen [place]TER[place] is a 65 y.o. male presents from home, states he has had some dizziness and neck pains for a while. Seen by PCP and referred to physical therapy. Today while walking up onto his porch, he lost his balance and fell off the porch. He does not think he lost consciousness. He sustained an abrasion to his hand but no other reported injuries. He called PCP who recommended he come to the ED for evaluation.    Past Medical History:  Diagnosis Date   Anxiety    Coronary artery disease involving native coronary 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 ([place]) 07/18/2017   Cardiac cath 1020 1418: Mid LAD to Dist LAD lesion, 99 %stenosed. - PCI with resolute DES (2.5 X 26 mm Resolute Onyx stent - 2.75 mm);ostial LAD 30%. Ostial ramus 40%. Proximal to mid LAD 50%. Normal LV function and normal LVEDP.    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   Skull fracture [facility])     Past Surgical History:  Procedure Laterality Date   CORONARY STENT INTERVENTION N/A 07/17/2017   Procedure: CORONARY STENT INTERVENTION;  Surgeon: Wellington Hampshire, MD;  Location: [place] [facility];  Service: Cardiovascular;  Laterality: N/A;   CORONARY STENT INTERVENTION N/A 05/30/2021   Procedure: CORONARY STENT INTERVENTION;  Surgeon: Leonie Man, MD;  Location: [place] [facility];  Service: Cardiovascular;  Laterality: N/A;   FOOT SURGERY Left    KNEE ARTHROPLASTY Left 07/30/2018   Procedure: LEFT TOTAL KNEE ARTHROPLASTY WITH COMPUTER NAVIGATION;  Surgeon: Rod Can, MD;  Location: [facility];  Service: Orthopedics;  Laterality: Left;  Needs RNFA   KNEE ARTHROPLASTY Right 11/02/2020   Procedure: COMPUTER ASSISTED TOTAL KNEE ARTHROPLASTY;  Surgeon: Rod Can, MD;  Location: [facility];  Service: Orthopedics;  Laterality: Right;  171min   LEFT HEART CATH AND CORONARY ANGIOGRAPHY N/A 07/17/2017   Procedure: LEFT HEART CATH AND CORONARY ANGIOGRAPHY;  Surgeon: Wellington Hampshire, MD;  Location: [place] [facility];  Service: Cardiovascular;  Laterality: N/A;   LEFT HEART CATH AND CORONARY ANGIOGRAPHY N/A 05/30/2021   Procedure: LEFT HEART CATH AND CORONARY ANGIOGRAPHY;  Surgeon: Leonie Man, MD;  Location: [place] [facility];  Service: Cardiovascular;  Laterality: N/A;   left wrist surgery     SKIN GRAFT  1998   TONSILLECTOMY      Family History  Problem Relation Age of Onset   Stroke Father 35   Heart attack Father 55   CAD Mother    Prostate cancer Brother    Thyroid nodules Brother     Social History   Tobacco Use   Smoking status: Former    Types: Cigarettes    Quit date: 09/24/1988    Years since quitting: 32.9   Smokeless tobacco: Never   Tobacco comments:    tobacco use - no  Vaping Use   Vaping Use: Never used  Substance Use Topics   Alcohol use: No   Drug use: No     Home Medications Prior to Admission medications   Medication Sig Start Date End Date Taking? Authorizing Provider  aspirin EC 81 MG tablet Take 1 tablet (81 mg total)  by mouth daily. Swallow whole. 05/23/21   Arnoldo Lenis, MD  carvedilol (COREG) 3.125 MG tablet Take 2 tablets (6.25 mg total) by mouth 2 (two) times daily with a meal. 05/31/21   Croitoru, Mihai, MD  Cholecalciferol (VITAMIN D3) 25 MCG (1000 UT) CAPS Take 1 capsule by mouth daily.    [provider]  citalopram (CELEXA) 40 MG tablet Take 0.5 tablets (20 mg total) by mouth daily. 05/31/21   Croitoru, Mihai, MD  fish oil-omega-3 fatty acids 1000 MG capsule Take 1 g by mouth daily.    [provider]  losartan (COZAAR) 50 MG tablet Take 0.5 tablets (25 mg total) by mouth in the morning and at bedtime. 07/10/21   Arnoldo Lenis, MD  nitroGLYCERIN (NITROSTAT) 0.4 MG SL  tablet DISSOLVE ONE TABLET UNDER THE TONGUE EVERY 5 MINUTES AS NEEDED FOR CHEST PAIN.  DO NOT EXCEED A TOTAL OF 3 DOSES IN 15 MINUTES 05/31/21   Reino Bellis B, NP  potassium chloride SA (K-DUR,KLOR-CON) 20 MEQ tablet Take 20 mEq by mouth daily.    [provider]  rosuvastatin (CRESTOR) 20 MG tablet Take 1 tablet (20 mg total) by mouth daily. 06/12/21   Verta Ellen., NP  ticagrelor (BRILINTA) 90 MG TABS tablet Take 1 tablet (90 mg total) by mouth 2 (two) times daily. 06/02/21   Hilty, Nadean Corwin, MD     Allergies    Lipitor [atorvastatin]   Review of Systems   Review of Systems A comprehensive review of systems was completed and negative except as noted in HPI.    Physical Exam BP (!) 174/91    Pulse 65    Temp 98 F (36.7 C)    Resp 17    SpO2 100%   Physical Exam Vitals and nursing note reviewed.  Constitutional:      Appearance: Normal appearance.  HENT:     Head: Normocephalic and atraumatic.     Nose: Nose normal.     Mouth/Throat:     Mouth: Mucous membranes are moist.  Eyes:     Extraocular Movements: Extraocular movements intact.     Conjunctiva/sclera: Conjunctivae normal.  Cardiovascular:     Rate and Rhythm: Normal rate.  Pulmonary:     Effort: Pulmonary effort is normal.     Breath sounds: Normal breath sounds.  Abdominal:     General: Abdomen is flat.     Palpations: Abdomen is soft.     Tenderness: There is no abdominal tenderness.  Musculoskeletal:        General: No swelling. Normal range of motion.     Cervical back: Neck supple. No tenderness.  Skin:    General: Skin is warm and dry.     Comments: Abrasion dorsal L hand  Neurological:     General: No focal deficit present.     Mental Status: He is alert.  Psychiatric:        Mood and Affect: Mood normal.     ED Results / Procedures / Treatments   Labs (all labs ordered are listed, but only abnormal results are displayed) Labs Reviewed  BASIC METABOLIC PANEL - Abnormal;  Notable for the following components:      Result Value   CO2 20 (*)    Glucose, Bld 110 (*)    BUN 27 (*)    Creatinine, Ser 1.53 (*)    GFR, Estimated 50 (*)    All other components within normal limits  CBC - Abnormal;  Notable for the following components:   RBC 4.12 (*)    Hemoglobin 11.8 (*)    HCT 37.9 (*)    RDW 15.6 (*)    Platelets 120 (*)    All other components within normal limits  URINALYSIS, ROUTINE W REFLEX MICROSCOPIC - Abnormal; Notable for the following components:   Glucose, UA 50 (*)    Hgb urine dipstick SMALL (*)    Protein, ur 100 (*)    All other components within normal limits  CBG MONITORING, ED - Abnormal; Notable for the following components:   Glucose-Capillary 117 (*)    All other components within normal limits    EKG EKG Interpretation  Date/Time:  Friday September 08 2021 15:18:25 EST Ventricular Rate:  70 PR Interval:  140 QRS Duration: 82 QT Interval:  438 QTC Calculation: 473 R Axis:   -17 Text Interpretation: Unusual P axis, possible ectopic atrial tachycardia with Blocked Premature atrial complexes Nonspecific ST abnormality Abnormal ECG Since last tracing ST-t wave abnormality have improved. Confirmed by Calvert Cantor [phone]) on 09/08/2021 4:36:22 PM  Radiology CT Head Wo Contrast  Result Date: 09/08/2021 CLINICAL DATA:  Syncope.  Fell and hit head. EXAM: CT HEAD WITHOUT CONTRAST CT CERVICAL SPINE WITHOUT CONTRAST TECHNIQUE: Multidetector CT imaging of the head and cervical spine was performed following the standard protocol without intravenous contrast. Multiplanar CT image reconstructions of the cervical spine were also generated. COMPARISON:  None. FINDINGS: CT HEAD FINDINGS Brain: No evidence of acute infarction, hemorrhage, hydrocephalus, extra-axial collection or mass lesion/mass effect. Chronic encephalomalacia left lateral temporal lobe. Based on location this may be posttraumatic or possibly post ischemic. Vascular: Negative  for hyperdense vessel Skull: Negative Sinuses/Orbits: Negative Other: None CT CERVICAL SPINE FINDINGS Alignment: Mild anterolisthesis C5-6 Skull base and vertebrae: Negative for fracture Soft tissues and spinal canal: Negative Disc levels: Multilevel disc and facet degeneration. Disc degeneration and spurring most prominent at C6-7. Right facet degeneration C2-3 and C3-4 and left facet degeneration multiple levels most severely at C5-6. Upper chest: Lung apices clear bilaterally Other: None IMPRESSION: 1. No acute intracranial abnormality. Chronic encephalomalacia left lateral temporal lobe. Question prior head trauma history 2. Cervical spondylosis.  Negative for fracture. Electronically Signed   By: Franchot Gallo M.D.   On: 09/08/2021 16:54   CT Cervical Spine Wo Contrast  Result Date: 09/08/2021 CLINICAL DATA:  Syncope.  Fell and hit head. EXAM: CT HEAD WITHOUT CONTRAST CT CERVICAL SPINE WITHOUT CONTRAST TECHNIQUE: Multidetector CT imaging of the head and cervical spine was performed following the standard protocol without intravenous contrast. Multiplanar CT image reconstructions of the cervical spine were also generated. COMPARISON:  None. FINDINGS: CT HEAD FINDINGS Brain: No evidence of acute infarction, hemorrhage, hydrocephalus, extra-axial collection or mass lesion/mass effect. Chronic encephalomalacia left lateral temporal lobe. Based on location this may be posttraumatic or possibly post ischemic. Vascular: Negative for hyperdense vessel Skull: Negative Sinuses/Orbits: Negative Other: None CT CERVICAL SPINE FINDINGS Alignment: Mild anterolisthesis C5-6 Skull base and vertebrae: Negative for fracture Soft tissues and spinal canal: Negative Disc levels: Multilevel disc and facet degeneration. Disc degeneration and spurring most prominent at C6-7. Right facet degeneration C2-3 and C3-4 and left facet degeneration multiple levels most severely at C5-6. Upper chest: Lung apices clear bilaterally Other:  None IMPRESSION: 1. No acute intracranial abnormality. Chronic encephalomalacia left lateral temporal lobe. Question prior head trauma history 2. Cervical spondylosis.  Negative for fracture. Electronically Signed   By: Franchot Gallo M.D.   On: 09/08/2021 16:54  Procedures Procedures  Medications Ordered in the ED Medications - No data to display   MDM Rules/Calculators/A&P MDM Patient with loss of balance, no loss of consciousness. No significant injuries on exam. Labs ordered in triage are at baseline, awaiting UA.   ED Course  I have reviewed the triage vital signs and the nursing notes.  Pertinent labs & imaging results that were available during my care of the patient were reviewed by me and considered in my medical decision making (see chart for details).  Clinical Course as of 09/08/21 2002  Fri Sep 08, 2021  1915 UA is neg. CBC and BMP are at baseline. No other concerning findings. Advised to follow up with PCP. RTED for any other concerns.  [CS]    Clinical Course User Index [CS] Truddie Hidden, MD    Final Clinical Impression(s) / ED Diagnoses Final diagnoses:  Abrasion of left hand, initial encounter  Fall, initial encounter    Rx / DC Orders ED Discharge Orders     None        Truddie Hidden, MD 09/08/21 2002

## 2021-09-08 NOTE — ED Triage Notes (Signed)
Passed out at home, fell off porch

## 2021-09-11 DIAGNOSIS — M545 Low back pain, unspecified: Secondary | ICD-10-CM | POA: Diagnosis not present

## 2021-09-11 DIAGNOSIS — M542 Cervicalgia: Secondary | ICD-10-CM | POA: Diagnosis not present

## 2021-09-19 DIAGNOSIS — M545 Low back pain, unspecified: Secondary | ICD-10-CM | POA: Diagnosis not present

## 2021-09-19 DIAGNOSIS — M542 Cervicalgia: Secondary | ICD-10-CM | POA: Diagnosis not present

## 2021-09-25 DIAGNOSIS — M545 Low back pain, unspecified: Secondary | ICD-10-CM | POA: Diagnosis not present

## 2021-09-25 DIAGNOSIS — M542 Cervicalgia: Secondary | ICD-10-CM | POA: Diagnosis not present

## 2021-10-02 DIAGNOSIS — M545 Low back pain, unspecified: Secondary | ICD-10-CM | POA: Diagnosis not present

## 2021-10-02 DIAGNOSIS — M542 Cervicalgia: Secondary | ICD-10-CM | POA: Diagnosis not present

## 2021-10-09 DIAGNOSIS — M545 Low back pain, unspecified: Secondary | ICD-10-CM | POA: Diagnosis not present

## 2021-10-09 DIAGNOSIS — M542 Cervicalgia: Secondary | ICD-10-CM | POA: Diagnosis not present

## 2021-10-16 DIAGNOSIS — M545 Low back pain, unspecified: Secondary | ICD-10-CM | POA: Diagnosis not present

## 2021-10-16 DIAGNOSIS — M542 Cervicalgia: Secondary | ICD-10-CM | POA: Diagnosis not present

## 2021-10-23 DIAGNOSIS — M542 Cervicalgia: Secondary | ICD-10-CM | POA: Diagnosis not present

## 2021-10-23 DIAGNOSIS — M545 Low back pain, unspecified: Secondary | ICD-10-CM | POA: Diagnosis not present

## 2021-10-30 DIAGNOSIS — M542 Cervicalgia: Secondary | ICD-10-CM | POA: Diagnosis not present

## 2021-10-30 DIAGNOSIS — M545 Low back pain, unspecified: Secondary | ICD-10-CM | POA: Diagnosis not present

## 2021-10-30 DIAGNOSIS — Z96653 Presence of artificial knee joint, bilateral: Secondary | ICD-10-CM | POA: Diagnosis not present

## 2021-10-30 DIAGNOSIS — Z96651 Presence of right artificial knee joint: Secondary | ICD-10-CM | POA: Diagnosis not present

## 2021-10-30 NOTE — Progress Notes (Signed)
Clinical Summary Mr. Hammond is a 66 y.o.male seen today for follow up of the following medical problems.   1.CAD -prior DES to mid distal LAD in 06/2017, small septal Jlyn Cerros jailed - chronic chest pains unchanged, often starts in back and radiates upward. Better with sodas. - no SOB/DOE - compliant with meds    05/2021 cath as reported below. DES to LAD . Presented with unstable angina.  05/2021 echo LVEF 60-65% Interventional has recommended at 1 year long term brilllinta vs plavix alone as opposed to aspirin   2. HTN -complant with meds - medical therapy has been affected by chronic orthostatic symptoms.      3. Hyperlipidemia - labs followed by pcp - he is on crestor.   05/2021 TC 107 TG 102 HDL 42 LDL 45   4.Lightheadedness - only occurs with standing - bottles of water x 1-3, occasoinal coffee, diet pepsi bottles x 1,    - prevously orthostatic in clinic - was getting out of car few days ago, felt lightheaded and backwards. Fell to ground, no LOC - 2-3 bottles of water perday.   Past Medical History:  Diagnosis Date   Anxiety    Coronary artery disease involving native coronary 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 ([place]) 07/18/2017   Cardiac cath 1020 1418: Mid LAD to Dist LAD lesion, 99 %stenosed. - PCI with resolute DES (2.5 X 26 mm Resolute Onyx stent - 2.75 mm);ostial LAD 30%. Ostial ramus 40%. Proximal to mid LAD 50%. Normal LV function and normal LVEDP.    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 Swallow whole. 90 tablet 3   carvedilol (COREG) 3.125 MG tablet Take 2 tablets (6.25 mg total) by  mouth 2 (two) times daily with a meal. 301 tablet 11   Cholecalciferol (VITAMIN D3) 25 MCG (1000 UT) CAPS Take 1 capsule by mouth daily.     citalopram (CELEXA) 40 MG tablet Take 0.5 tablets (20 mg total) by mouth daily. 30 tablet 11   fish oil-omega-3 fatty acids 1000 MG capsule Take 1 g by mouth daily.     losartan (COZAAR) 50 MG tablet Take 0.5 tablets (25 mg total) by mouth in the morning and at bedtime. 90 tablet 3   nitroGLYCERIN (NITROSTAT) 0.4 MG SL tablet DISSOLVE ONE TABLET UNDER THE TONGUE EVERY 5 MINUTES AS NEEDED FOR CHEST PAIN.  DO NOT EXCEED A TOTAL OF 3 DOSES IN 15 MINUTES 25 tablet 2   potassium chloride SA (K-DUR,KLOR-CON) 20 MEQ tablet Take 20 mEq by mouth daily.     rosuvastatin (CRESTOR) 20 MG tablet Take 1 tablet (20 mg total) by mouth daily. 90 tablet 3   ticagrelor (BRILINTA) 90 MG TABS tablet Take 1 tablet (90 mg total) by mouth 2 (two) times daily. 60 tablet 11   No current facility-administered medications for this visit.     Past Surgical History:  Procedure Laterality Date   CORONARY STENT INTERVENTION N/A 07/17/2017   Procedure: CORONARY STENT INTERVENTION;  Surgeon: Wellington Hampshire, MD;  Location:  [facility];  Service: Cardiovascular;  Laterality: N/A;   CORONARY STENT INTERVENTION N/A 05/30/2021   Procedure: CORONARY STENT INTERVENTION;  Surgeon: Leonie Man, MD;  Location: [facility];  Service: Cardiovascular;  Laterality: N/A;   FOOT SURGERY Left    KNEE ARTHROPLASTY Left 07/30/2018   Procedure: LEFT TOTAL KNEE ARTHROPLASTY WITH COMPUTER NAVIGATION;  Surgeon: Rod Can, MD;  Location: [facility];  Service: Orthopedics;  Laterality: Left;  Needs RNFA   KNEE ARTHROPLASTY Right 11/02/2020   Procedure: COMPUTER ASSISTED TOTAL KNEE ARTHROPLASTY;  Surgeon: Rod Can, MD;  Location: [facility];  Service: Orthopedics;  Laterality: Right;  167min   LEFT HEART CATH AND CORONARY ANGIOGRAPHY N/A 07/17/2017   Procedure: LEFT HEART CATH AND CORONARY  ANGIOGRAPHY;  Surgeon: Wellington Hampshire, MD;  Location: [facility];  Service: Cardiovascular;  Laterality: N/A;   LEFT HEART CATH AND CORONARY ANGIOGRAPHY N/A 05/30/2021   Procedure: LEFT HEART CATH AND CORONARY ANGIOGRAPHY;  Surgeon: Leonie Man, MD;  Location: [facility];  Service: Cardiovascular;  Laterality: N/A;   left wrist surgery     SKIN GRAFT  1998   TONSILLECTOMY       Allergies  Allergen Reactions   Lipitor [Atorvastatin]     Pain in neck and legs after taking medication      Family History  Problem Relation Age of Onset   Stroke Father 78   Heart attack Father 52   CAD Mother    Prostate cancer Brother    Thyroid nodules Brother      Social History Mr. Croft reports that he quit smoking about 33 years ago. His smoking use included cigarettes. He has never used smokeless tobacco. Mr. Hennessee reports no history of alcohol use.   Review of Systems CONSTITUTIONAL: No weight loss, fever, chills, weakness or fatigue.  HEENT: Eyes: No visual loss, blurred vision, double vision or yellow sclerae.No hearing loss, sneezing, congestion, runny nose or sore throat.  SKIN: No rash or itching.  CARDIOVASCULAR: per hpi RESPIRATORY: No shortness of breath, cough or sputum.  GASTROINTESTINAL: No anorexia, nausea, vomiting or diarrhea. No abdominal pain or blood.  GENITOURINARY: No burning on urination, no polyuria NEUROLOGICAL: No headache, dizziness, syncope, paralysis, ataxia, numbness or tingling in the extremities. No change in bowel or bladder control.  MUSCULOSKELETAL: No muscle, back pain, joint pain or stiffness.  LYMPHATICS: No enlarged nodes. No history of splenectomy.  PSYCHIATRIC: No history of depression or anxiety.  ENDOCRINOLOGIC: No reports of sweating, cold or heat intolerance. No polyuria or polydipsia.  [name]   Physical Examination Today's Vitals   10/31/21 1012  BP: (!) 136/92  Pulse: (!) 55  SpO2: 98%  Weight: 219 lb 12.8 oz  (99.7 kg)  Height: 5\' 11"  (1.803 m)   Body mass index is 30.66 kg/m.  Gen: resting comfortably, no acute distress HEENT: no scleral icterus, pupils equal round and reactive, no palptable cervical adenopathy,  CV: RRR, no m/r/g, no jvd Resp: Clear to auscultation bilaterally GI: abdomen is soft, non-tender, non-distended, normal bowel sounds, no hepatosplenomegaly MSK: extremities are warm, no edema.  Skin: warm, no rash Neuro:  no focal deficits Psych: appropriate affect   Diagnostic Studies  Echocardiogram: 06/2017 Study Conclusions   - Left ventricle: The cavity size was normal. Wall thickness was    increased in a pattern of mild LVH. Systolic function was normal.    The estimated ejection fraction was in the range of 60% to  65%.    Wall motion was normal; there were no regional wall motion    abnormalities. Diastolic dysfunction, grade indeterminate.  - Aortic valve: Moderately calcified annulus. Trileaflet.    Cardiac Catheterization: 06/2017 The left ventricular systolic function is normal. LV end diastolic pressure is normal. The left ventricular ejection fraction is 55-65% by visual estimate. Ost LAD lesion, 30 %stenosed. Ost Ramus to Ramus lesion, 40 %stenosed. Prox LAD to Mid LAD lesion, 50 %stenosed. A drug eluting stent was successfully placed. Mid LAD to Dist LAD lesion, 99 %stenosed. Post intervention, there is a 0% residual stenosis.   1.  Severe one-vessel coronary artery disease with 99% stenosis in the mid to distal LAD. 2.  Normal LV systolic function and normal left ventricular end-diastolic pressure. 3.  Successful angioplasty and drug-eluting stent placement to the mid/distal LAD.  A small septal Jayd Cadieux was jailed by the stent with sluggish flow.  This resulted in chest pain and was treated with nitroglycerin drip.   Post cath Recommendations: Dual antiplatelet therapy for at least one year.  Aggressive treatment of risk factors.  Continue  nitroglycerin drip as needed for chest pain.     Assessment and Plan   1.CAD -recent stent as reported above - no symptoms, continue current meds - at 1 year interventional has recommended stopping ASA and continuing either low dose brillnita or plavix.    2. Hyperlipidemia - continue current meds     3. HTN - ongoing issues with orthostatic dizziness and falls - will have to tolerate higher bp. Lower coreg to 3.125mg  bid - encouraged aggressive oral hydration.    F/u 6 months       Arnoldo Lenis, M.D.

## 2021-10-31 ENCOUNTER — Encounter: Payer: Self-pay | Admitting: Cardiology

## 2021-10-31 ENCOUNTER — Other Ambulatory Visit: Payer: Self-pay

## 2021-10-31 ENCOUNTER — Ambulatory Visit (INDEPENDENT_AMBULATORY_CARE_PROVIDER_SITE_OTHER): Payer: Medicare HMO | Admitting: Cardiology

## 2021-10-31 VITALS — BP 136/92 | HR 55 | Ht 71.0 in | Wt 219.8 lb

## 2021-10-31 DIAGNOSIS — E782 Mixed hyperlipidemia: Secondary | ICD-10-CM | POA: Diagnosis not present

## 2021-10-31 DIAGNOSIS — I2510 Atherosclerotic heart disease of native coronary artery without angina pectoris: Secondary | ICD-10-CM

## 2021-10-31 DIAGNOSIS — I10 Essential (primary) hypertension: Secondary | ICD-10-CM

## 2021-10-31 MED ORDER — CARVEDILOL 3.125 MG PO TABS: 3.1250 mg | ORAL_TABLET | Freq: Two times a day (BID) | ORAL | Status: DC

## 2021-10-31 NOTE — Patient Instructions (Signed)
Medication Instructions:  Decrease Coreg to 3.125mg  twice a day  Continue all other medications.     Labwork: none  Testing/Procedures: none  Follow-Up: 6 months   Any Other Special Instructions Will Be Listed Below (If Applicable).   If you need a refill on your cardiac medications before your next appointment, please call your pharmacy.

## 2021-11-06 DIAGNOSIS — M542 Cervicalgia: Secondary | ICD-10-CM | POA: Diagnosis not present

## 2021-11-06 DIAGNOSIS — M545 Low back pain, unspecified: Secondary | ICD-10-CM | POA: Diagnosis not present

## 2021-11-13 ENCOUNTER — Ambulatory Visit
Admission: EM | Admit: 2021-11-13 | Discharge: 2021-11-13 | Disposition: A | Discharge status: Home / Self Care | Payer: Medicare HMO | Attending: Family Medicine | Admitting: Family Medicine

## 2021-11-13 ENCOUNTER — Other Ambulatory Visit: Payer: Self-pay

## 2021-11-13 DIAGNOSIS — Z23 Encounter for immunization: Secondary | ICD-10-CM

## 2021-11-13 DIAGNOSIS — S61212A Laceration without foreign body of right middle finger without damage to nail, initial encounter: Secondary | ICD-10-CM

## 2021-11-13 MED ORDER — TETANUS-DIPHTH-ACELL PERTUSSIS 5-2.5-18.5 LF-MCG/0.5 IM SUSY
0.5000 mL | PREFILLED_SYRINGE | Freq: Once | INTRAMUSCULAR | Status: AC
  Administered 2021-11-13: 0.5 mL via INTRAMUSCULAR

## 2021-11-13 MED ORDER — MUPIROCIN 2 % EX OINT: 1.0000 "application " | TOPICAL_OINTMENT | Freq: Two times a day (BID) | CUTANEOUS | 0 refills | Status: DC

## 2021-11-13 MED ORDER — CHLORHEXIDINE GLUCONATE 4 % EX LIQD: Freq: Every day | CUTANEOUS | 0 refills | Status: DC | PRN

## 2021-11-13 NOTE — Discharge Instructions (Addendum)
Follow-up in 7 to 10 days for evaluation of suture removal.  Return sooner if the area becomes red, swollen, starts draining thick drainage or any other concerning findings.

## 2021-11-13 NOTE — ED Triage Notes (Signed)
Pt has laceration to tip of right 4th finger from opening can of dog food, bleeding is controlled, new pressure dressing applied

## 2021-11-13 NOTE — ED Triage Notes (Signed)
Patient states that this morning he was opening a can of dog food and cut his left middle finger

## 2021-11-17 NOTE — ED Provider Notes (Signed)
[facility]    CSN: [id number] Arrival date & time: 11/13/21  [id number]      History   Chief Complaint Chief Complaint  Patient presents with   Finger Injury    HPI Stephen Graves is a 66 y.o. male.   Presenting today with a laceration to the right middle finger that occurred several hours ago when he was opening a metal dog food can.  He states he has held pressure on it for several hours but the bleeding will not slow down.  He denies numbness, tingling, loss of range of motion, weakness.  Of note, he is taking Brilinta daily for CAD status post stent placement.  Unsure when his last tetanus shot was.   Past Medical History:  Diagnosis Date   Anxiety    Coronary artery disease involving native coronary 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 ([place]) 07/18/2017   Cardiac cath 1020 1418: Mid LAD to Dist LAD lesion, 99 %stenosed. - PCI with resolute DES (2.5 X 26 mm Resolute Onyx stent - 2.75 mm);ostial LAD 30%. Ostial ramus 40%. Proximal to mid LAD 50%. Normal LV function and normal LVEDP.    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   Skull fracture [facility])     Patient Active Problem List   Diagnosis Date Noted   Osteoarthritis of right knee 11/02/2020   S/P TKR (total knee replacement), right 11/02/2020   Osteoarthritis of left knee 07/30/2018   Coronary artery disease involving native coronary 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 ([place]) 07/18/2017   Acute coronary syndrome ([place])    Chest pain 07/16/2017   Hyperlipidemia with target LDL less than 70 07/16/2017   Presence of drug coated stent in LAD coronary artery 06/2017   Anxiety state 02/15/2010   Essential hypertension 02/15/2010   CHEST PAIN UNSPECIFIED 02/15/2010    Past Surgical History:  Procedure Laterality Date   CORONARY STENT INTERVENTION N/A 07/17/2017   Procedure: CORONARY STENT INTERVENTION;   Surgeon: Wellington Hampshire, MD;  Location: [facility];  Service: Cardiovascular;  Laterality: N/A;   CORONARY STENT INTERVENTION N/A 05/30/2021   Procedure: CORONARY STENT INTERVENTION;  Surgeon: Leonie Man, MD;  Location: [place];  Service: Cardiovascular;  Laterality: N/A;   FOOT SURGERY Left    KNEE ARTHROPLASTY Left 07/30/2018   Procedure: LEFT TOTAL KNEE ARTHROPLASTY WITH COMPUTER NAVIGATION;  Surgeon: Rod Can, MD;  Location: [facility];  Service: Orthopedics;  Laterality: Left;  Needs RNFA   KNEE ARTHROPLASTY Right 11/02/2020   Procedure: COMPUTER ASSISTED TOTAL KNEE ARTHROPLASTY;  Surgeon: Rod Can, MD;  Location: [facility];  Service: Orthopedics;  Laterality: Right;  184min   LEFT HEART CATH AND CORONARY ANGIOGRAPHY N/A 07/17/2017   Procedure: LEFT HEART CATH AND CORONARY ANGIOGRAPHY;  Surgeon: Wellington Hampshire, MD;  Location: [place];  Service: Cardiovascular;  Laterality: N/A;   LEFT HEART CATH AND CORONARY ANGIOGRAPHY N/A 05/30/2021   Procedure: LEFT HEART CATH AND CORONARY ANGIOGRAPHY;  Surgeon: Leonie Man, MD;  Location: [place];  Service: Cardiovascular;  Laterality: N/A;   left wrist surgery     SKIN GRAFT  1998   TONSILLECTOMY         Home Medications    Prior to Admission medications   Medication Sig Start Date End Date Taking? Authorizing Provider  chlorhexidine (HIBICLENS) 4 % external liquid Apply topically daily as needed. 11/13/21  Yes Volney American, PA-C  mupirocin ointment (BACTROBAN) 2 % Apply 1 application topically 2 (two) times daily. 11/13/21  Yes Volney American, PA-C  aspirin EC 81 MG tablet Take 1 tablet (81 mg total) by mouth daily. Swallow whole. 05/23/21   Arnoldo Lenis, MD  carvedilol (COREG) 3.125 MG tablet Take 1 tablet (3.125 mg total) by mouth 2 (two) times daily. 10/31/21   Arnoldo Lenis, MD  Cholecalciferol (VITAMIN D3) 25 MCG (1000 UT) CAPS Take 1 capsule by mouth daily.     [provider]  citalopram (CELEXA) 40 MG tablet Take 0.5 tablets (20 mg total) by mouth daily. 05/31/21   Croitoru, Mihai, MD  fish oil-omega-3 fatty acids 1000 MG capsule Take 1 g by mouth daily.    [provider]  losartan (COZAAR) 50 MG tablet Take 0.5 tablets (25 mg total) by mouth in the morning and at bedtime. 07/10/21   Arnoldo Lenis, MD  metFORMIN (GLUCOPHAGE-XR) 500 MG 24 hr tablet Take 1 tablet by mouth daily.    [provider]  nitroGLYCERIN (NITROSTAT) 0.4 MG SL tablet DISSOLVE ONE TABLET UNDER THE TONGUE EVERY 5 MINUTES AS NEEDED FOR CHEST PAIN.  DO NOT EXCEED A TOTAL OF 3 DOSES IN 15 MINUTES 05/31/21   Reino Bellis B, NP  potassium chloride SA (K-DUR,KLOR-CON) 20 MEQ tablet Take 20 mEq by mouth daily.    [provider]  rosuvastatin (CRESTOR) 20 MG tablet Take 1 tablet (20 mg total) by mouth daily. 06/12/21   Verta Ellen., NP  ticagrelor (BRILINTA) 90 MG TABS tablet Take 1 tablet (90 mg total) by mouth 2 (two) times daily. 06/02/21   Hilty, Nadean Corwin, MD    Family History Family History  Problem Relation Age of Onset   Stroke Father 31   Heart attack Father 54   CAD Mother    Prostate cancer Brother    Thyroid nodules Brother     Social History Social History   Tobacco Use   Smoking status: Former    Types: Cigarettes    Quit date: 09/24/1988    Years since quitting: 33.1   Smokeless tobacco: Never   Tobacco comments:    tobacco use - no  Vaping Use   Vaping Use: Never used  Substance Use Topics   Alcohol use: No   Drug use: No     Allergies   Lipitor [atorvastatin]   Review of Systems Review of Systems Per HPI  Physical Exam Triage Vital Signs ED Triage Vitals  Enc Vitals Group     BP 11/13/21 1345 123/82     Pulse Rate 11/13/21 1345 66     Resp 11/13/21 1345 18     Temp 11/13/21 1345 97.8 F (36.6 C)     Temp Source 11/13/21 1345 Oral     SpO2 11/13/21 1345 96 %     Weight --      Height --       Head Circumference --      Peak Flow --      Pain Score 11/13/21 1342 0     Pain Loc --      Pain Edu? --      Excl. in [place]? --    No data found.  Updated Vital Signs BP 123/82 (BP Location: Right Arm)    Pulse 66    Temp 97.8 F (36.6 C) (  Oral)    Resp 18    SpO2 96%   Visual Acuity Right Eye Distance:   Left Eye Distance:   Bilateral Distance:    Right Eye Near:   Left Eye Near:    Bilateral Near:     Physical Exam Vitals and nursing note reviewed.  Constitutional:      Appearance: Normal appearance.  HENT:     Head: Atraumatic.  Eyes:     Extraocular Movements: Extraocular movements intact.     Conjunctiva/sclera: Conjunctivae normal.  Cardiovascular:     Rate and Rhythm: Normal rate and regular rhythm.  Pulmonary:     Effort: Pulmonary effort is normal.     Breath sounds: Normal breath sounds.  Musculoskeletal:        General: Signs of injury present. Normal range of motion.     Cervical back: Normal range of motion and neck supple.  Skin:    General: Skin is warm.     Comments: Slightly gaping laceration to the distal right middle finger horizontally, about 1.5 cm in diameter.  Neurological:     General: No focal deficit present.     Mental Status: He is oriented to person, place, and time.     Comments: Right hand neurovascularly intact  Psychiatric:        Mood and Affect: Mood normal.        Thought Content: Thought content normal.        Judgment: Judgment normal.     UC Treatments / Results  Labs (all labs ordered are listed, but only abnormal results are displayed) Labs Reviewed - No data to display  EKG   Radiology No results found.  Procedures Laceration Repair  Date/Time: 11/13/2021 2:00 PM Performed by: Volney American, PA-C Authorized by: Volney American, PA-C   Consent:    Consent obtained:  Verbal   Consent given by:  Patient   Risks, benefits, and alternatives were discussed: yes     Risks discussed:   Infection, pain and poor cosmetic result   Alternatives discussed: Pressure dressing and skin glue. Universal protocol:    Procedure explained and questions answered to patient or proxy's satisfaction: yes     Relevant documents present and verified: yes     Patient identity confirmed:  Verbally with patient Anesthesia:    Anesthesia method:  Topical application   Topical anesthetic:  LET Laceration details:    Location:  Finger   Finger location:  R long finger   Length (cm):  1.5   Depth (mm):  3 Pre-procedure details:    Preparation:  Patient was prepped and draped in usual sterile fashion Exploration:    Hemostasis achieved with:  LET and direct pressure   Wound exploration: wound explored through full range of motion     Contaminated: no   Treatment:    Area cleansed with:  Chlorhexidine   Amount of cleaning:  Standard   Visualized foreign bodies/material removed: no   Skin repair:    Repair method:  Sutures   Suture size:  4-0   Suture material:  Prolene   Suture technique:  Simple interrupted   Number of sutures:  3 Approximation:    Approximation:  Close Repair type:    Repair type:  Simple Post-procedure details:    Dressing:  Non-adherent dressing and splint for protection   Procedure completion:  Tolerated well, no immediate complications (including critical care time)  Medications Ordered in UC Medications  Tdap (BOOSTRIX) injection  0.5 mL (0.5 mLs Intramuscular Given 11/13/21 1448)    Initial Impression / Assessment and Plan / UC Course  I have reviewed the triage vital signs and the nursing notes.  Pertinent labs & imaging results that were available during my care of the patient were reviewed by me and considered in my medical decision making (see chart for details).     Let applied to control the bleeding with good result, 3 simple interrupted sutures placed after extensive cleaning and visualization.  Dressing applied, splint applied for protection.   Tetanus updated as he is unclear on when his last tetanus shot was.  Discussed home wound care, strict return precautions.  Aware to come back in 7 to 10 days for suture removal.  Final Clinical Impressions(s) / UC Diagnoses   Final diagnoses:  Laceration of right middle finger without foreign body without damage to nail, initial encounter     Discharge Instructions      Follow-up in 7 to 10 days for evaluation of suture removal.  Return sooner if the area becomes red, swollen, starts draining thick drainage or any other concerning findings.    ED Prescriptions     Medication Sig Dispense Auth. Provider   chlorhexidine (HIBICLENS) 4 % external liquid Apply topically daily as needed. 120 mL Volney American, PA-C   mupirocin ointment (BACTROBAN) 2 % Apply 1 application topically 2 (two) times daily. 22 g Volney American, [place]      PDMP not reviewed this encounter.   Volney American, [place] 11/17/21 1920

## 2021-11-20 DIAGNOSIS — M545 Low back pain, unspecified: Secondary | ICD-10-CM | POA: Diagnosis not present

## 2021-11-20 DIAGNOSIS — M542 Cervicalgia: Secondary | ICD-10-CM | POA: Diagnosis not present

## 2021-11-22 ENCOUNTER — Encounter: Payer: Self-pay | Admitting: Cardiology

## 2021-11-22 DIAGNOSIS — R7309 Other abnormal glucose: Secondary | ICD-10-CM | POA: Diagnosis not present

## 2021-11-22 DIAGNOSIS — E6609 Other obesity due to excess calories: Secondary | ICD-10-CM | POA: Diagnosis not present

## 2021-11-22 DIAGNOSIS — Z4802 Encounter for removal of sutures: Secondary | ICD-10-CM | POA: Diagnosis not present

## 2021-11-22 DIAGNOSIS — E119 Type 2 diabetes mellitus without complications: Secondary | ICD-10-CM | POA: Diagnosis not present

## 2021-11-22 DIAGNOSIS — S56123D Laceration of flexor muscle, fascia and tendon of right middle finger at forearm level, subsequent encounter: Secondary | ICD-10-CM | POA: Diagnosis not present

## 2021-11-28 DIAGNOSIS — M542 Cervicalgia: Secondary | ICD-10-CM | POA: Diagnosis not present

## 2021-11-28 DIAGNOSIS — M545 Low back pain, unspecified: Secondary | ICD-10-CM | POA: Diagnosis not present

## 2021-12-04 DIAGNOSIS — M542 Cervicalgia: Secondary | ICD-10-CM | POA: Diagnosis not present

## 2021-12-04 DIAGNOSIS — M545 Low back pain, unspecified: Secondary | ICD-10-CM | POA: Diagnosis not present

## 2021-12-22 ENCOUNTER — Ambulatory Visit: Payer: Medicare HMO | Admitting: Cardiology

## 2022-01-21 DIAGNOSIS — I10 Essential (primary) hypertension: Secondary | ICD-10-CM | POA: Diagnosis not present

## 2022-01-21 DIAGNOSIS — I25118 Atherosclerotic heart disease of native coronary artery with other forms of angina pectoris: Secondary | ICD-10-CM | POA: Diagnosis not present

## 2022-01-21 DIAGNOSIS — E785 Hyperlipidemia, unspecified: Secondary | ICD-10-CM | POA: Diagnosis not present

## 2022-03-16 ENCOUNTER — Telehealth: Payer: Self-pay

## 2022-03-16 NOTE — Telephone Encounter (Signed)
Dr. Wyline Mood to review.  Patient previously had a stent placed and balloon angioplasty performed in September 2022.  He says he will not have any teeth removed, listed procedure is only to remove dental epulis (which is soft outgrowth of gum) from the mandiable.  He denies any recent chest pain or any anginal symptom, would you be okay with him holding Brilinta for this procedure?  Please forward your response to [facility]

## 2022-03-21 ENCOUNTER — Telehealth: Payer: Self-pay | Admitting: Cardiology

## 2022-03-21 NOTE — Telephone Encounter (Signed)
Per Dr. Harl Bowie- Prefer patient to hold off on surgery on Sept if possible.  Patient informed and verbalized understanding of plan. Aware that note has been faxed to surgeon's office already.   Note-patent informed that DPR should be filled out that gives permission to speak with sister whenever she calls office. Verbalized understanding

## 2022-03-21 NOTE — Telephone Encounter (Signed)
Pt's sister Clarise Cruz, called back stating that they are awaiting an update on whether or not he should hold the [place] for his upcoming procedure. Please advise.

## 2022-04-09 IMAGING — DX DG CHEST 2V
4 series · 4 of 4 positions shown · non-contrast
Comparison: 07/16/2017

CLINICAL DATA: Chest pain radiating down the arm

EXAM:
CHEST - 2 VIEW

[chest lat (1 of 2)]
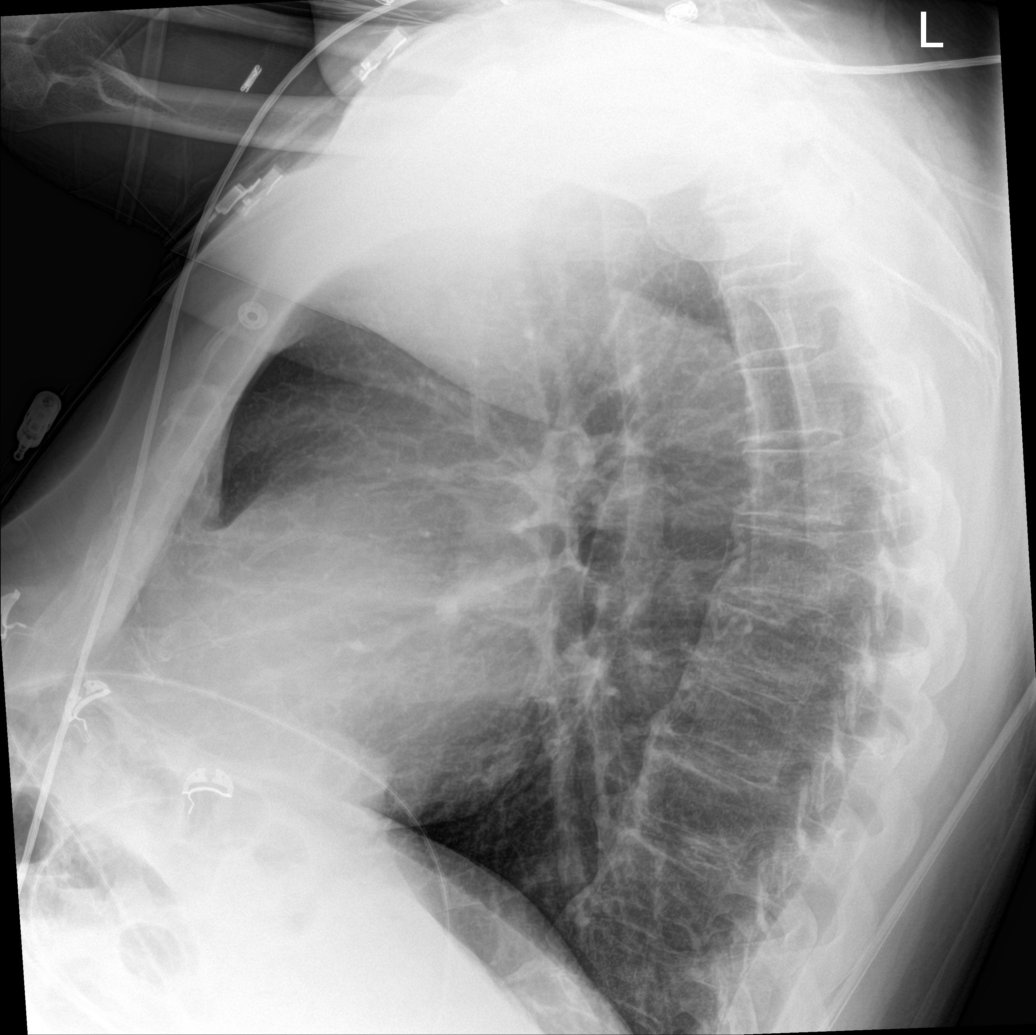

[chest ap (1 of 2)]
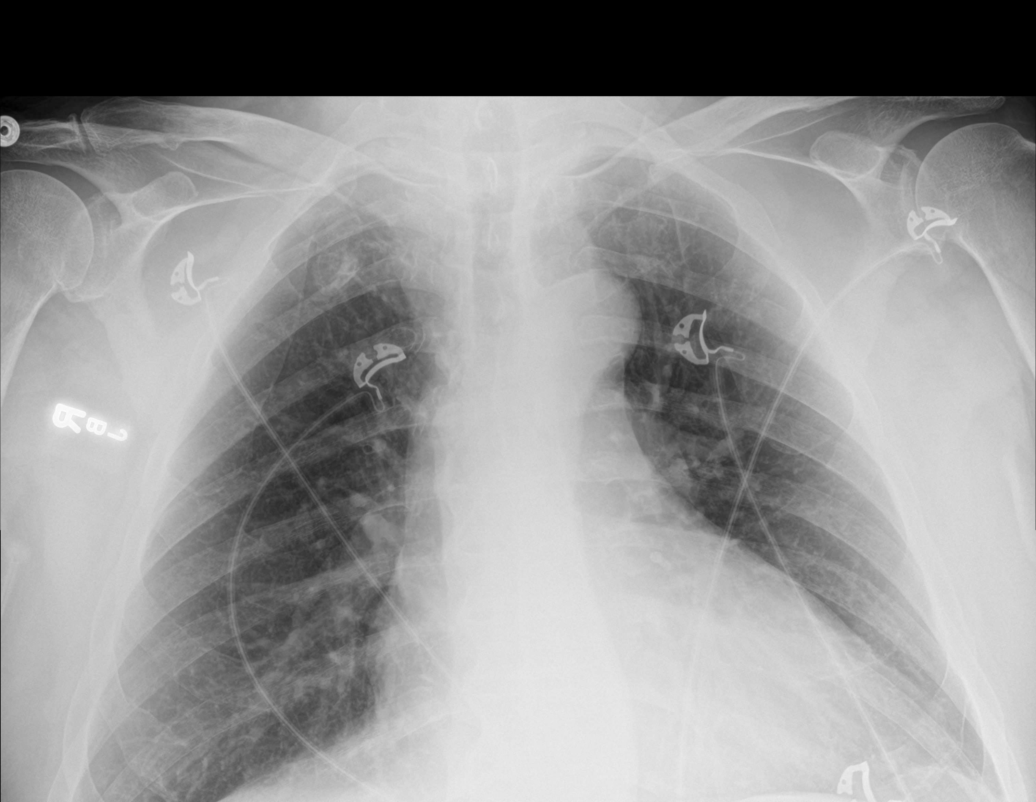

[chest ap (2 of 2)]
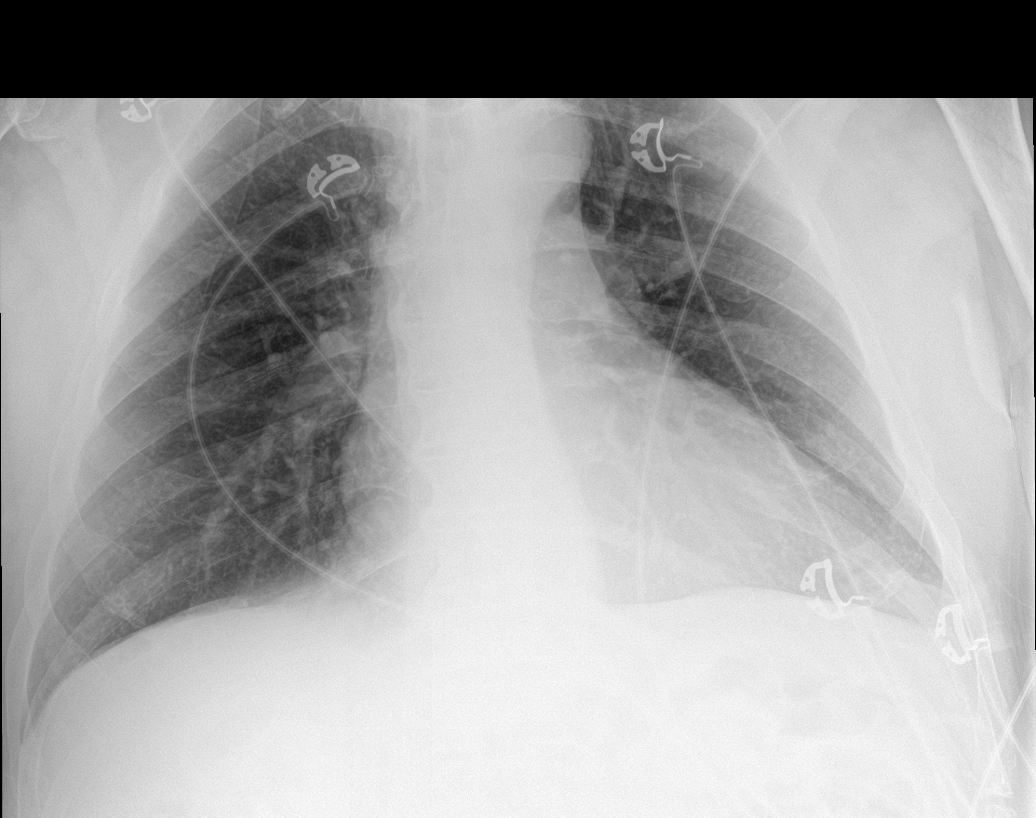

[chest lat (2 of 2)]
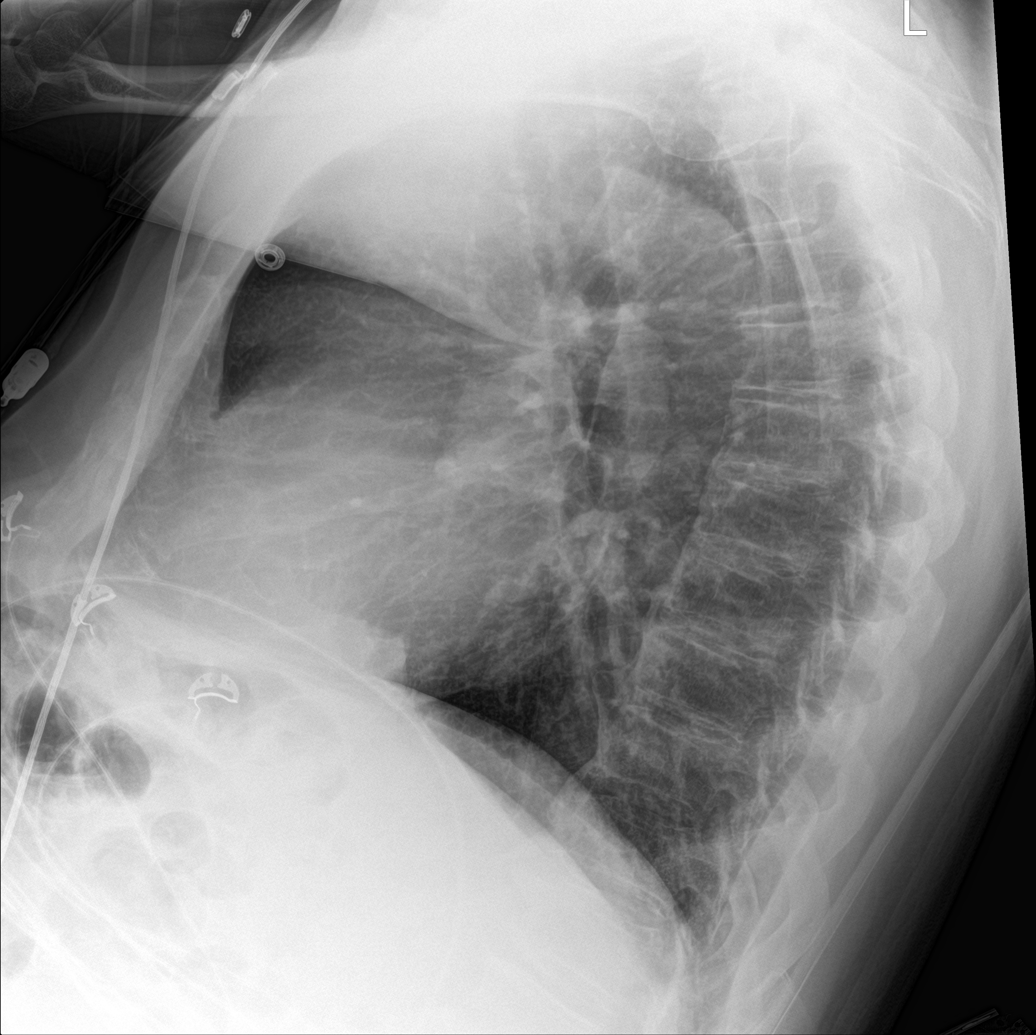

[4 of 4 positions shown; findings below may reference images not displayed]

FINDINGS: Normal heart size and mediastinal contours when allowing for apical
fat pad. There is no edema, consolidation, effusion, or
pneumothorax. Multilevel bridging thoracic osteophytes.
IMPRESSION: No evidence of active disease.

## 2022-05-07 ENCOUNTER — Encounter: Payer: Self-pay | Admitting: Cardiology

## 2022-05-07 ENCOUNTER — Ambulatory Visit (INDEPENDENT_AMBULATORY_CARE_PROVIDER_SITE_OTHER): Payer: Medicare HMO | Admitting: Cardiology

## 2022-05-07 VITALS — BP 140/95 | HR 64 | Ht 71.0 in | Wt 211.4 lb

## 2022-05-07 DIAGNOSIS — I2510 Atherosclerotic heart disease of native coronary artery without angina pectoris: Secondary | ICD-10-CM

## 2022-05-07 DIAGNOSIS — Z01810 Encounter for preprocedural cardiovascular examination: Secondary | ICD-10-CM

## 2022-05-07 DIAGNOSIS — I10 Essential (primary) hypertension: Secondary | ICD-10-CM

## 2022-05-07 DIAGNOSIS — E782 Mixed hyperlipidemia: Secondary | ICD-10-CM | POA: Diagnosis not present

## 2022-05-07 MED ORDER — CLOPIDOGREL BISULFATE 75 MG PO TABS: 75.0000 mg | ORAL_TABLET | Freq: Every day | ORAL | 1 refills | Status: DC

## 2022-05-07 NOTE — Progress Notes (Signed)
Clinical Summary [name] is a 66 y.o.male seen today for follow up of the following medical problems.    1.CAD -prior DES to mid distal LAD in 06/2017, small septal Stephen Graves jailed -05/2021 cath as reported below. DES to LAD . Presented with unstable angina.  05/2021 echo LVEF 60-65% - at 1 year interventional has recommended stopping ASA and continuing either low dose brillnita or plavix.  - rare left sided pinching feeling, no exertional symptoms. No SOB/DOE.  - compliant with meds    2. HTN - medical therapy has been affected by chronic orthostatic symptoms.   -home bps 110s-130s/70s-80   3. Hyperlipidemia - he is on crestor.  05/2021 TC 107 TG 102 HDL 42 LDL 45 - compliant with meds   4.Orthostatic dizziness - chronic, have encouraged increased hydration  5. Preop evaluation - considering oral surgery   Past Medical History:  Diagnosis Date   Anxiety    Coronary artery disease involving native coronary 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 ([facility]) 07/18/2017   Cardiac cath 1020 1418: Mid LAD to Dist LAD lesion, 99 %stenosed. - PCI with resolute DES (2.5 X 26 mm Resolute Onyx stent - 2.75 mm);ostial LAD 30%. Ostial ramus 40%. Proximal to mid LAD 50%. Normal LV function and normal LVEDP.    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 Swallow whole. 90 tablet 3   carvedilol (COREG) 3.125 MG tablet Take 1 tablet (3.125 mg total) by mouth 2 (two) times daily.     chlorhexidine (HIBICLENS) 4 % external liquid Apply topically daily as needed. 120 mL 0   Cholecalciferol (VITAMIN D3) 25 MCG (1000 UT) CAPS Take 1 capsule  by mouth daily.     citalopram (CELEXA) 40 MG tablet Take 0.5 tablets (20 mg total) by mouth daily. 30 tablet 11   fish oil-omega-3 fatty acids 1000 MG capsule Take 1 g by mouth daily.     losartan (COZAAR) 50 MG tablet Take 0.5 tablets (25 mg total) by mouth in the morning and at bedtime. 90 tablet 3   metFORMIN (GLUCOPHAGE-XR) 500 MG 24 hr tablet Take 1 tablet by mouth daily.     mupirocin ointment (BACTROBAN) 2 % Apply 1 application topically 2 (two) times daily. 22 g 0   nitroGLYCERIN (NITROSTAT) 0.4 MG SL tablet DISSOLVE ONE TABLET UNDER THE TONGUE EVERY 5 MINUTES AS NEEDED FOR CHEST PAIN.  DO NOT EXCEED A TOTAL OF 3 DOSES IN 15 MINUTES 25 tablet 2   potassium chloride SA (K-DUR,KLOR-CON) 20 MEQ tablet Take 20 mEq by mouth daily.     rosuvastatin (CRESTOR) 20 MG tablet Take 1 tablet (20 mg total) by mouth daily. 90 tablet 3   ticagrelor (BRILINTA) 90 MG TABS tablet Take 1 tablet (90 mg total) by mouth 2 (two) times daily. 60 tablet 11   No current facility-administered medications for this visit.     Past Surgical History:  Procedure Laterality Date   CORONARY STENT INTERVENTION N/A 07/17/2017   Procedure: CORONARY STENT INTERVENTION;  Surgeon: [name]  A, MD;  Location: [facility];  Service: Cardiovascular;  Laterality: N/A;   CORONARY STENT INTERVENTION N/A 05/30/2021   Procedure: CORONARY STENT INTERVENTION;  Surgeon: Leonie Man, MD;  Location: [facility];  Service: Cardiovascular;  Laterality: N/A;   FOOT SURGERY Left    KNEE ARTHROPLASTY Left 07/30/2018   Procedure: LEFT TOTAL KNEE ARTHROPLASTY WITH COMPUTER NAVIGATION;  Surgeon: Rod Can, MD;  Location: [facility];  Service: Orthopedics;  Laterality: Left;  Needs RNFA   KNEE ARTHROPLASTY Right 11/02/2020   Procedure: COMPUTER ASSISTED TOTAL KNEE ARTHROPLASTY;  Surgeon: Rod Can, MD;  Location: [facility];  Service: Orthopedics;  Laterality: Right;  138mn   LEFT HEART CATH AND CORONARY ANGIOGRAPHY  N/A 07/17/2017   Procedure: LEFT HEART CATH AND CORONARY ANGIOGRAPHY;  Surgeon: AWellington Hampshire MD;  [facility];  Service: Cardiovascular;  Laterality: N/A;   LEFT HEART CATH AND CORONARY ANGIOGRAPHY N/A 05/30/2021   Procedure: LEFT HEART CATH AND CORONARY ANGIOGRAPHY;  Surgeon: HLeonie Man MD;  [facility];  Service: Cardiovascular;  Laterality: N/A;   left wrist surgery     SKIN GRAFT  1998   TONSILLECTOMY       Allergies  Allergen Reactions   Lipitor [Atorvastatin]     Pain in neck and legs after taking medication      Family History  Problem Relation Age of Onset   Stroke Father 760  Heart attack Father 649  CAD Mother    Prostate cancer Brother    Thyroid nodules Brother      Social History Mr. DBoehlereports that he quit smoking about 33 years ago. His smoking use included cigarettes. He has never used smokeless tobacco. [name] no history of alcohol use.   Review of Systems CONSTITUTIONAL: No weight loss, fever, chills, weakness or fatigue.  HEENT: Eyes: No visual loss, blurred vision, double vision or yellow sclerae.No hearing loss, sneezing, congestion, runny nose or sore throat.  SKIN: No rash or itching.  CARDIOVASCULAR: per hpi RESPIRATORY: No shortness of breath, cough or sputum.  GASTROINTESTINAL: No anorexia, nausea, vomiting or diarrhea. No abdominal pain or blood.  GENITOURINARY: No burning on urination, no polyuria NEUROLOGICAL: per hpi MUSCULOSKELETAL: No muscle, back pain, joint pain or stiffness.  LYMPHATICS: No enlarged nodes. No history of splenectomy.  PSYCHIATRIC: No history of depression or anxiety.  ENDOCRINOLOGIC: No reports of sweating, cold or heat intolerance. No polyuria or polydipsia.  .[name]  Physical Examination Today's Vitals   05/07/22 1001 05/07/22 1009 05/07/22 1034  BP: (!) 130/92 (!) 136/104 (!) 140/95  Pulse: 64    SpO2: 98%    Weight: 211 lb 6.4 oz (95.9 kg)     Height: '5\' 11"'$  (1.803 m)     Body mass index is 29.48 kg/m.  Gen: resting comfortably, no acute distress HEENT: no scleral icterus, pupils equal round and reactive, no palptable cervical adenopathy,  CV: RRR, no m/r/g no jvd Resp: Clear to auscultation bilaterally GI: abdomen is soft, non-tender, non-distended, normal bowel sounds, no hepatosplenomegaly MSK: extremities are warm, no edema.  Skin: warm, no rash Neuro:  no focal deficits Psych: appropriate affect   Diagnostic Studies  Echocardiogram: 06/2017 Study Conclusions   - Left ventricle: The cavity size was normal. Wall thickness was    increased in a pattern of mild LVH. Systolic function was normal.    The estimated ejection fraction was in the range of 60%  to 65%.    Wall motion was normal; there were no regional wall motion    abnormalities. Diastolic dysfunction, grade indeterminate.  - Aortic valve: Moderately calcified annulus. Trileaflet.    Cardiac Catheterization: 06/2017 The left ventricular systolic function is normal. LV end diastolic pressure is normal. The left ventricular ejection fraction is 55-65% by visual estimate. Ost LAD lesion, 30 %stenosed. Ost Ramus to Ramus lesion, 40 %stenosed. Prox LAD to Mid LAD lesion, 50 %stenosed. A drug eluting stent was successfully placed. Mid LAD to Dist LAD lesion, 99 %stenosed. Post intervention, there is a 0% residual stenosis.   1.  Severe one-vessel coronary artery disease with 99% stenosis in the mid to distal LAD. 2.  Normal LV systolic function and normal left ventricular end-diastolic pressure. 3.  Successful angioplasty and drug-eluting stent placement to the mid/distal LAD.  A small septal Morrell Fluke was jailed by the stent with sluggish flow.  This resulted in chest pain and was treated with nitroglycerin drip.   Post cath Recommendations: Dual antiplatelet therapy for at least one year.  Aggressive treatment of risk factors.  Continue nitroglycerin drip  as needed for chest pain.   Assessment and Plan  1.CAD -05/30/22 will mark one year from stent. Due to length of stenting in LAD interventional recommended either plavix or low dose brillinta as single agent. - we will d/c aspirin and brillinta 05/30/22, on that day start plavix '75mg'$  daily as single agent.  - ok to hold plavix for upcoming oral surgery 5 days prior, resume day after   2. Hyperlipidemia - request labs from pcp, continue curren tmeds     3. HTN - ongoing issues with orthostatic dizziness and falls - bp elevated here but home bp's at goal, continue current meds  4. Preop evaluation - ok for oral surgery Sept 18.  Will be off aspirin and brillinta by then and just on plavix, can hold plavix 5 days prior to surgery and resume day after       Arnoldo Lenis, M.D.

## 2022-05-07 NOTE — Patient Instructions (Addendum)
Medication Instructions:  Your physician has recommended you make the following change in your medication:  Start plavix 75 mg once a day on  Continue all other medications as directed  Labwork: none  Testing/Procedures: none  Follow-Up:  Your physician recommends that you schedule a follow-up appointment in: 6 months  Any Other Special Instructions Will Be Listed Below (If Applicable).  On 05/30/22 Stop taking the aspirin and brillinta Stop taking the Plavix 5 days before your procedure and resume taking one day after.   If you need a refill on your cardiac medications before your next appointment, please call your pharmacy.

## 2022-05-10 DIAGNOSIS — H25813 Combined forms of age-related cataract, bilateral: Secondary | ICD-10-CM | POA: Diagnosis not present

## 2022-05-10 DIAGNOSIS — H524 Presbyopia: Secondary | ICD-10-CM | POA: Diagnosis not present

## 2022-05-10 DIAGNOSIS — H5213 Myopia, bilateral: Secondary | ICD-10-CM | POA: Diagnosis not present

## 2022-05-10 DIAGNOSIS — R7303 Prediabetes: Secondary | ICD-10-CM | POA: Diagnosis not present

## 2022-05-10 DIAGNOSIS — H52203 Unspecified astigmatism, bilateral: Secondary | ICD-10-CM | POA: Diagnosis not present

## 2022-05-24 ENCOUNTER — Other Ambulatory Visit: Payer: Self-pay | Admitting: *Deleted

## 2022-05-24 MED ORDER — ROSUVASTATIN CALCIUM 20 MG PO TABS: 20.0000 mg | ORAL_TABLET | Freq: Every day | ORAL | 1 refills | Status: DC

## 2022-06-04 DIAGNOSIS — M542 Cervicalgia: Secondary | ICD-10-CM | POA: Diagnosis not present

## 2022-06-04 DIAGNOSIS — I25118 Atherosclerotic heart disease of native coronary artery with other forms of angina pectoris: Secondary | ICD-10-CM | POA: Diagnosis not present

## 2022-06-04 DIAGNOSIS — I10 Essential (primary) hypertension: Secondary | ICD-10-CM | POA: Diagnosis not present

## 2022-06-04 DIAGNOSIS — E119 Type 2 diabetes mellitus without complications: Secondary | ICD-10-CM | POA: Diagnosis not present

## 2022-06-04 DIAGNOSIS — Z6828 Body mass index (BMI) 28.0-28.9, adult: Secondary | ICD-10-CM | POA: Diagnosis not present

## 2022-06-04 DIAGNOSIS — S56123D Laceration of flexor muscle, fascia and tendon of right middle finger at forearm level, subsequent encounter: Secondary | ICD-10-CM | POA: Diagnosis not present

## 2022-06-04 DIAGNOSIS — Z23 Encounter for immunization: Secondary | ICD-10-CM | POA: Diagnosis not present

## 2022-06-04 DIAGNOSIS — M545 Low back pain, unspecified: Secondary | ICD-10-CM | POA: Diagnosis not present

## 2022-06-04 DIAGNOSIS — Z125 Encounter for screening for malignant neoplasm of prostate: Secondary | ICD-10-CM | POA: Diagnosis not present

## 2022-06-04 DIAGNOSIS — Z0001 Encounter for general adult medical examination with abnormal findings: Secondary | ICD-10-CM | POA: Diagnosis not present

## 2022-06-04 DIAGNOSIS — E6609 Other obesity due to excess calories: Secondary | ICD-10-CM | POA: Diagnosis not present

## 2022-08-03 DIAGNOSIS — F324 Major depressive disorder, single episode, in partial remission: Secondary | ICD-10-CM | POA: Diagnosis not present

## 2022-08-03 DIAGNOSIS — E876 Hypokalemia: Secondary | ICD-10-CM | POA: Diagnosis not present

## 2022-08-03 DIAGNOSIS — R69 Illness, unspecified: Secondary | ICD-10-CM | POA: Diagnosis not present

## 2022-08-03 DIAGNOSIS — Z87891 Personal history of nicotine dependence: Secondary | ICD-10-CM | POA: Diagnosis not present

## 2022-08-03 DIAGNOSIS — E1122 Type 2 diabetes mellitus with diabetic chronic kidney disease: Secondary | ICD-10-CM | POA: Diagnosis not present

## 2022-08-03 DIAGNOSIS — E1136 Type 2 diabetes mellitus with diabetic cataract: Secondary | ICD-10-CM | POA: Diagnosis not present

## 2022-08-03 DIAGNOSIS — E669 Obesity, unspecified: Secondary | ICD-10-CM | POA: Diagnosis not present

## 2022-08-03 DIAGNOSIS — I2510 Atherosclerotic heart disease of native coronary artery without angina pectoris: Secondary | ICD-10-CM | POA: Diagnosis not present

## 2022-08-03 DIAGNOSIS — I129 Hypertensive chronic kidney disease with stage 1 through stage 4 chronic kidney disease, or unspecified chronic kidney disease: Secondary | ICD-10-CM | POA: Diagnosis not present

## 2022-08-03 DIAGNOSIS — Z8249 Family history of ischemic heart disease and other diseases of the circulatory system: Secondary | ICD-10-CM | POA: Diagnosis not present

## 2022-08-03 DIAGNOSIS — E785 Hyperlipidemia, unspecified: Secondary | ICD-10-CM | POA: Diagnosis not present

## 2022-08-03 DIAGNOSIS — F411 Generalized anxiety disorder: Secondary | ICD-10-CM | POA: Diagnosis not present

## 2022-08-03 DIAGNOSIS — Z008 Encounter for other general examination: Secondary | ICD-10-CM | POA: Diagnosis not present

## 2022-08-03 DIAGNOSIS — N1831 Chronic kidney disease, stage 3a: Secondary | ICD-10-CM | POA: Diagnosis not present

## 2022-08-27 ENCOUNTER — Emergency Department (HOSPITAL_COMMUNITY): Payer: Medicare HMO

## 2022-08-27 ENCOUNTER — Observation Stay (HOSPITAL_COMMUNITY): Payer: Medicare HMO

## 2022-08-27 ENCOUNTER — Encounter (HOSPITAL_COMMUNITY): Payer: Self-pay

## 2022-08-27 ENCOUNTER — Inpatient Hospital Stay (HOSPITAL_COMMUNITY)
Admission: EM | Admit: 2022-08-27 | Discharge: 2022-09-02 | DRG: 564 | Disposition: A | Discharge status: Skilled Nursing Facility | Payer: Medicare HMO | Attending: Internal Medicine | Admitting: Internal Medicine

## 2022-08-27 DIAGNOSIS — R748 Abnormal levels of other serum enzymes: Secondary | ICD-10-CM

## 2022-08-27 DIAGNOSIS — E119 Type 2 diabetes mellitus without complications: Secondary | ICD-10-CM

## 2022-08-27 DIAGNOSIS — M4312 Spondylolisthesis, cervical region: Secondary | ICD-10-CM | POA: Diagnosis not present

## 2022-08-27 DIAGNOSIS — T796XXA Traumatic ischemia of muscle, initial encounter: Principal | ICD-10-CM | POA: Diagnosis present

## 2022-08-27 DIAGNOSIS — W19XXXA Unspecified fall, initial encounter: Secondary | ICD-10-CM | POA: Diagnosis not present

## 2022-08-27 DIAGNOSIS — Z7902 Long term (current) use of antithrombotics/antiplatelets: Secondary | ICD-10-CM

## 2022-08-27 DIAGNOSIS — Z23 Encounter for immunization: Secondary | ICD-10-CM

## 2022-08-27 DIAGNOSIS — I10 Essential (primary) hypertension: Secondary | ICD-10-CM | POA: Diagnosis present

## 2022-08-27 DIAGNOSIS — Z743 Need for continuous supervision: Secondary | ICD-10-CM | POA: Diagnosis not present

## 2022-08-27 DIAGNOSIS — R42 Dizziness and giddiness: Secondary | ICD-10-CM | POA: Diagnosis not present

## 2022-08-27 DIAGNOSIS — Z87891 Personal history of nicotine dependence: Secondary | ICD-10-CM

## 2022-08-27 DIAGNOSIS — X19XXXA Contact with other heat and hot substances, initial encounter: Secondary | ICD-10-CM | POA: Diagnosis not present

## 2022-08-27 DIAGNOSIS — U071 COVID-19: Secondary | ICD-10-CM | POA: Diagnosis present

## 2022-08-27 DIAGNOSIS — D649 Anemia, unspecified: Secondary | ICD-10-CM | POA: Diagnosis present

## 2022-08-27 DIAGNOSIS — Z8249 Family history of ischemic heart disease and other diseases of the circulatory system: Secondary | ICD-10-CM

## 2022-08-27 DIAGNOSIS — Z043 Encounter for examination and observation following other accident: Secondary | ICD-10-CM | POA: Diagnosis not present

## 2022-08-27 DIAGNOSIS — R4182 Altered mental status, unspecified: Secondary | ICD-10-CM | POA: Diagnosis not present

## 2022-08-27 DIAGNOSIS — K5730 Diverticulosis of large intestine without perforation or abscess without bleeding: Secondary | ICD-10-CM | POA: Diagnosis not present

## 2022-08-27 DIAGNOSIS — E7800 Pure hypercholesterolemia, unspecified: Secondary | ICD-10-CM | POA: Diagnosis present

## 2022-08-27 DIAGNOSIS — M47812 Spondylosis without myelopathy or radiculopathy, cervical region: Secondary | ICD-10-CM | POA: Diagnosis not present

## 2022-08-27 DIAGNOSIS — Z602 Problems related to living alone: Secondary | ICD-10-CM | POA: Diagnosis present

## 2022-08-27 DIAGNOSIS — T2122XA Burn of second degree of abdominal wall, initial encounter: Secondary | ICD-10-CM | POA: Diagnosis present

## 2022-08-27 DIAGNOSIS — Z96651 Presence of right artificial knee joint: Secondary | ICD-10-CM | POA: Diagnosis present

## 2022-08-27 DIAGNOSIS — Y92009 Unspecified place in unspecified non-institutional (private) residence as the place of occurrence of the external cause: Secondary | ICD-10-CM

## 2022-08-27 DIAGNOSIS — R079 Chest pain, unspecified: Secondary | ICD-10-CM | POA: Diagnosis not present

## 2022-08-27 DIAGNOSIS — R296 Repeated falls: Secondary | ICD-10-CM | POA: Diagnosis present

## 2022-08-27 DIAGNOSIS — G8929 Other chronic pain: Secondary | ICD-10-CM | POA: Diagnosis present

## 2022-08-27 DIAGNOSIS — S2231XA Fracture of one rib, right side, initial encounter for closed fracture: Secondary | ICD-10-CM | POA: Diagnosis present

## 2022-08-27 DIAGNOSIS — Z751 Person awaiting admission to adequate facility elsewhere: Secondary | ICD-10-CM

## 2022-08-27 DIAGNOSIS — G9389 Other specified disorders of brain: Secondary | ICD-10-CM | POA: Diagnosis not present

## 2022-08-27 DIAGNOSIS — I25110 Atherosclerotic heart disease of native coronary artery with unstable angina pectoris: Secondary | ICD-10-CM | POA: Diagnosis present

## 2022-08-27 DIAGNOSIS — F419 Anxiety disorder, unspecified: Secondary | ICD-10-CM | POA: Diagnosis present

## 2022-08-27 DIAGNOSIS — E860 Dehydration: Secondary | ICD-10-CM | POA: Diagnosis present

## 2022-08-27 DIAGNOSIS — Z955 Presence of coronary angioplasty implant and graft: Secondary | ICD-10-CM

## 2022-08-27 DIAGNOSIS — N179 Acute kidney failure, unspecified: Secondary | ICD-10-CM | POA: Diagnosis present

## 2022-08-27 DIAGNOSIS — Z79899 Other long term (current) drug therapy: Secondary | ICD-10-CM

## 2022-08-27 DIAGNOSIS — Z888 Allergy status to other drugs, medicaments and biological substances status: Secondary | ICD-10-CM

## 2022-08-27 DIAGNOSIS — D696 Thrombocytopenia, unspecified: Secondary | ICD-10-CM | POA: Diagnosis present

## 2022-08-27 DIAGNOSIS — R531 Weakness: Secondary | ICD-10-CM | POA: Diagnosis not present

## 2022-08-27 DIAGNOSIS — Z7984 Long term (current) use of oral hypoglycemic drugs: Secondary | ICD-10-CM

## 2022-08-27 DIAGNOSIS — Z96652 Presence of left artificial knee joint: Secondary | ICD-10-CM | POA: Diagnosis present

## 2022-08-27 DIAGNOSIS — Z823 Family history of stroke: Secondary | ICD-10-CM

## 2022-08-27 DIAGNOSIS — Z8042 Family history of malignant neoplasm of prostate: Secondary | ICD-10-CM

## 2022-08-27 LAB — CBC WITH DIFFERENTIAL/PLATELET
Abs Immature Granulocytes: 0.05 10*3/uL (ref 0.00–0.07)
Basophils Absolute: 0 10*3/uL (ref 0.0–0.1)
Basophils Relative: 0 %
Eosinophils Absolute: 0 10*3/uL (ref 0.0–0.5)
Eosinophils Relative: 0 %
HCT: 41.7 % (ref 39.0–52.0)
Hemoglobin: 13.2 g/dL (ref 13.0–17.0)
Immature Granulocytes: 1 %
Lymphocytes Relative: 5 %
Lymphs Abs: 0.5 10*3/uL — ABNORMAL LOW (ref 0.7–4.0)
MCH: 28.1 pg (ref 26.0–34.0)
MCHC: 31.7 g/dL (ref 30.0–36.0)
MCV: 88.7 fL (ref 80.0–100.0)
Monocytes Absolute: 0.6 10*3/uL (ref 0.1–1.0)
Monocytes Relative: 6 %
Neutro Abs: 8.6 10*3/uL — ABNORMAL HIGH (ref 1.7–7.7)
Neutrophils Relative %: 88 %
Platelets: 144 10*3/uL — ABNORMAL LOW (ref 150–400)
RBC: 4.7 MIL/uL (ref 4.22–5.81)
RDW: 14.8 % (ref 11.5–15.5)
WBC: 9.7 10*3/uL (ref 4.0–10.5)
nRBC: 0 % (ref 0.0–0.2)

## 2022-08-27 LAB — TROPONIN I (HIGH SENSITIVITY)
Troponin I (High Sensitivity): 48 ng/L — ABNORMAL HIGH (ref <18)
Troponin I (High Sensitivity): 41 ng/L — ABNORMAL HIGH (ref <18)

## 2022-08-27 LAB — RESP PANEL BY RT-PCR (FLU A&B, COVID) ARPGX2
Influenza A by PCR: NEGATIVE
Influenza B by PCR: NEGATIVE
SARS Coronavirus 2 by RT PCR: POSITIVE — AB

## 2022-08-27 LAB — COMPREHENSIVE METABOLIC PANEL
ALT: 21 U/L (ref 0–44)
AST: 51 U/L — ABNORMAL HIGH (ref 15–41)
Albumin: 3.8 g/dL (ref 3.5–5.0)
Alkaline Phosphatase: 68 U/L (ref 38–126)
Anion gap: 11 (ref 5–15)
BUN: 35 mg/dL — ABNORMAL HIGH (ref 8–23)
CO2: 22 mmol/L (ref 22–32)
Calcium: 9.1 mg/dL (ref 8.9–10.3)
Chloride: 103 mmol/L (ref 98–111)
Creatinine, Ser: 1.46 mg/dL — ABNORMAL HIGH (ref 0.61–1.24)
GFR, Estimated: 52 mL/min — ABNORMAL LOW (ref >60)
Glucose, Bld: 174 mg/dL — ABNORMAL HIGH (ref 70–99)
Potassium: 4 mmol/L (ref 3.5–5.1)
Sodium: 136 mmol/L (ref 135–145)
Total Bilirubin: 1.5 mg/dL — ABNORMAL HIGH (ref 0.3–1.2)
Total Protein: 6.9 g/dL (ref 6.5–8.1)

## 2022-08-27 LAB — BLOOD GAS, VENOUS
Acid-Base Excess: 4.7 mmol/L — ABNORMAL HIGH (ref 0.0–2.0)
Bicarbonate: 29.2 mmol/L — ABNORMAL HIGH (ref 20.0–28.0)
Drawn by: 66860
O2 Saturation: 51.8 %
Patient temperature: 36.5
pCO2, Ven: 41 mmHg — ABNORMAL LOW (ref 44–60)
pH, Ven: 7.46 — ABNORMAL HIGH (ref 7.25–7.43)
pO2, Ven: 33 mmHg (ref 32–45)

## 2022-08-27 LAB — URINALYSIS, ROUTINE W REFLEX MICROSCOPIC
Bilirubin Urine: NEGATIVE
Glucose, UA: NEGATIVE mg/dL
Ketones, ur: 5 mg/dL — AB
Leukocytes,Ua: NEGATIVE
Nitrite: NEGATIVE
Protein, ur: 100 mg/dL — AB
Specific Gravity, Urine: 1.029 (ref 1.005–1.030)
pH: 5 (ref 5.0–8.0)

## 2022-08-27 LAB — CK
Total CK: 2783 U/L — ABNORMAL HIGH (ref 49–397)
Total CK: 2743 U/L — ABNORMAL HIGH (ref 49–397)

## 2022-08-27 LAB — PROTIME-INR
INR: 1.3 — ABNORMAL HIGH (ref 0.8–1.2)
Prothrombin Time: 16.1 seconds — ABNORMAL HIGH (ref 11.4–15.2)

## 2022-08-27 LAB — CBG MONITORING, ED: Glucose-Capillary: 101 mg/dL — ABNORMAL HIGH (ref 70–99)

## 2022-08-27 LAB — LACTIC ACID, PLASMA
Lactic Acid, Venous: 2.5 mmol/L (ref 0.5–1.9)
Lactic Acid, Venous: 2.1 mmol/L (ref 0.5–1.9)

## 2022-08-27 LAB — ETHANOL: Alcohol, Ethyl (B): <10 mg/dL (ref <10)

## 2022-08-27 LAB — MAGNESIUM: Magnesium: 2.1 mg/dL (ref 1.7–2.4)

## 2022-08-27 LAB — D-DIMER, QUANTITATIVE: D-Dimer, Quant: 2.25 ug/mL-FEU — ABNORMAL HIGH (ref 0.00–0.50)

## 2022-08-27 LAB — HIV ANTIBODY (ROUTINE TESTING W REFLEX): HIV Screen 4th Generation wRfx: NONREACTIVE

## 2022-08-27 MED ORDER — ONDANSETRON HCL 4 MG/2ML IJ SOLN: 4.0000 mg | Freq: Four times a day (QID) | INTRAMUSCULAR | Status: DC | PRN

## 2022-08-27 MED ORDER — HEPARIN SODIUM (PORCINE) 5000 UNIT/ML IJ SOLN
5000.0000 [IU] | Freq: Three times a day (TID) | INTRAMUSCULAR | Status: DC
  Administered 2022-08-27 – 2022-09-02 (×17): 5000 [IU] via SUBCUTANEOUS
  Filled 2022-08-27 (×17): qty 1

## 2022-08-27 MED ORDER — BISACODYL 10 MG RE SUPP: 10.0000 mg | Freq: Every day | RECTAL | Status: DC | PRN

## 2022-08-27 MED ORDER — SODIUM CHLORIDE 0.9 % IV SOLN: INTRAVENOUS | Status: DC | PRN

## 2022-08-27 MED ORDER — POLYETHYLENE GLYCOL 3350 17 G PO PACK
17.0000 g | PACK | Freq: Every day | ORAL | Status: DC | PRN
  Administered 2022-08-29 – 2022-09-01 (×2): 17 g via ORAL
  Filled 2022-08-27 (×2): qty 1

## 2022-08-27 MED ORDER — SODIUM CHLORIDE 0.9% FLUSH
3.0000 mL | Freq: Two times a day (BID) | INTRAVENOUS | Status: DC
  Administered 2022-08-28 – 2022-09-02 (×7): 3 mL via INTRAVENOUS

## 2022-08-27 MED ORDER — ACETAMINOPHEN 650 MG RE SUPP: 650.0000 mg | Freq: Four times a day (QID) | RECTAL | Status: DC | PRN

## 2022-08-27 MED ORDER — INSULIN ASPART 100 UNIT/ML IJ SOLN
0.0000 [IU] | Freq: Three times a day (TID) | INTRAMUSCULAR | Status: DC
  Administered 2022-08-28: 2 [IU] via SUBCUTANEOUS
  Administered 2022-08-29: 3 [IU] via SUBCUTANEOUS
  Administered 2022-08-30 – 2022-09-02 (×2): 2 [IU] via SUBCUTANEOUS

## 2022-08-27 MED ORDER — ADULT MULTIVITAMIN W/MINERALS CH
1.0000 | ORAL_TABLET | Freq: Every day | ORAL | Status: DC
  Administered 2022-08-27 – 2022-09-02 (×7): 1 via ORAL
  Filled 2022-08-27 (×7): qty 1

## 2022-08-27 MED ORDER — INSULIN ASPART 100 UNIT/ML IJ SOLN: 0.0000 [IU] | Freq: Every day | INTRAMUSCULAR | Status: DC

## 2022-08-27 MED ORDER — LACTATED RINGERS IV SOLN: INTRAVENOUS | Status: DC

## 2022-08-27 MED ORDER — ASPIRIN 81 MG PO TBEC
81.0000 mg | DELAYED_RELEASE_TABLET | Freq: Every day | ORAL | Status: DC
  Administered 2022-08-27 – 2022-09-02 (×7): 81 mg via ORAL
  Filled 2022-08-27 (×7): qty 1

## 2022-08-27 MED ORDER — OXYCODONE HCL 5 MG PO TABS: 5.0000 mg | ORAL_TABLET | Freq: Three times a day (TID) | ORAL | Status: DC | PRN

## 2022-08-27 MED ORDER — ZINC SULFATE 220 (50 ZN) MG PO CAPS
220.0000 mg | ORAL_CAPSULE | Freq: Every day | ORAL | Status: DC
  Administered 2022-08-27 – 2022-09-02 (×7): 220 mg via ORAL
  Filled 2022-08-27 (×7): qty 1

## 2022-08-27 MED ORDER — SODIUM CHLORIDE 0.9% FLUSH: 3.0000 mL | INTRAVENOUS | Status: DC | PRN

## 2022-08-27 MED ORDER — IPRATROPIUM-ALBUTEROL 20-100 MCG/ACT IN AERS
1.0000 | INHALATION_SPRAY | Freq: Four times a day (QID) | RESPIRATORY_TRACT | Status: DC
  Administered 2022-08-27 – 2022-08-28 (×4): 1 via RESPIRATORY_TRACT
  Filled 2022-08-27 (×2): qty 4

## 2022-08-27 MED ORDER — LACTATED RINGERS IV BOLUS (SEPSIS)
1000.0000 mL | Freq: Once | INTRAVENOUS | Status: AC
  Administered 2022-08-27: 1000 mL via INTRAVENOUS

## 2022-08-27 MED ORDER — SODIUM CHLORIDE 0.9% FLUSH
3.0000 mL | Freq: Two times a day (BID) | INTRAVENOUS | Status: DC
  Administered 2022-08-28 – 2022-09-02 (×4): 3 mL via INTRAVENOUS

## 2022-08-27 MED ORDER — SODIUM CHLORIDE 0.9 % IV SOLN: INTRAVENOUS | Status: DC

## 2022-08-27 MED ORDER — TRAZODONE HCL 50 MG PO TABS
50.0000 mg | ORAL_TABLET | Freq: Every evening | ORAL | Status: DC | PRN
  Administered 2022-08-28 – 2022-09-01 (×3): 50 mg via ORAL
  Filled 2022-08-27 (×3): qty 1

## 2022-08-27 MED ORDER — METHYLPREDNISOLONE SODIUM SUCC 40 MG IJ SOLR: 40.0000 mg | Freq: Two times a day (BID) | INTRAMUSCULAR | Status: DC

## 2022-08-27 MED ORDER — TECHNETIUM TO 99M ALBUMIN AGGREGATED
4.0000 | Freq: Once | INTRAVENOUS | Status: AC | PRN
  Administered 2022-08-27: 4.4 via INTRAVENOUS

## 2022-08-27 MED ORDER — ACETAMINOPHEN 325 MG PO TABS: 650.0000 mg | ORAL_TABLET | Freq: Four times a day (QID) | ORAL | Status: DC | PRN

## 2022-08-27 MED ORDER — NIRMATRELVIR/RITONAVIR (PAXLOVID) TABLET (RENAL DOSING)
2.0000 | ORAL_TABLET | Freq: Two times a day (BID) | ORAL | Status: AC
  Administered 2022-08-27 – 2022-08-31 (×8): 2 via ORAL
  Filled 2022-08-27: qty 20

## 2022-08-27 MED ORDER — TETANUS-DIPHTH-ACELL PERTUSSIS 5-2.5-18.5 LF-MCG/0.5 IM SUSY
0.5000 mL | PREFILLED_SYRINGE | Freq: Once | INTRAMUSCULAR | Status: AC
  Administered 2022-08-27: 0.5 mL via INTRAMUSCULAR
  Filled 2022-08-27: qty 0.5

## 2022-08-27 MED ORDER — POTASSIUM CHLORIDE CRYS ER 20 MEQ PO TBCR
20.0000 meq | EXTENDED_RELEASE_TABLET | Freq: Every day | ORAL | Status: DC
  Administered 2022-08-27 – 2022-09-02 (×7): 20 meq via ORAL
  Filled 2022-08-27 (×7): qty 1

## 2022-08-27 MED ORDER — HYDROCOD POLI-CHLORPHE POLI ER 10-8 MG/5ML PO SUER: 5.0000 mL | Freq: Two times a day (BID) | ORAL | Status: DC | PRN

## 2022-08-27 MED ORDER — GUAIFENESIN-DM 100-10 MG/5ML PO SYRP: 10.0000 mL | ORAL_SOLUTION | ORAL | Status: DC | PRN

## 2022-08-27 MED ORDER — HYDRALAZINE HCL 20 MG/ML IJ SOLN
10.0000 mg | INTRAMUSCULAR | Status: DC | PRN
  Administered 2022-08-28 – 2022-09-01 (×6): 10 mg via INTRAVENOUS
  Filled 2022-08-27 (×6): qty 1

## 2022-08-27 MED ORDER — ONDANSETRON HCL 4 MG PO TABS: 4.0000 mg | ORAL_TABLET | Freq: Four times a day (QID) | ORAL | Status: DC | PRN

## 2022-08-27 MED ORDER — VITAMIN C 500 MG PO TABS
500.0000 mg | ORAL_TABLET | Freq: Every day | ORAL | Status: DC
  Administered 2022-08-27 – 2022-09-02 (×7): 500 mg via ORAL
  Filled 2022-08-27 (×7): qty 1

## 2022-08-27 MED ORDER — CARVEDILOL 3.125 MG PO TABS
3.1250 mg | ORAL_TABLET | Freq: Two times a day (BID) | ORAL | Status: DC
  Administered 2022-08-27 – 2022-09-02 (×12): 3.125 mg via ORAL
  Filled 2022-08-27 (×12): qty 1

## 2022-08-27 MED ORDER — CITALOPRAM HYDROBROMIDE 20 MG PO TABS
20.0000 mg | ORAL_TABLET | Freq: Every day | ORAL | Status: DC
  Administered 2022-08-27 – 2022-09-02 (×7): 20 mg via ORAL
  Filled 2022-08-27 (×7): qty 1

## 2022-08-27 MED ORDER — CLOPIDOGREL BISULFATE 75 MG PO TABS
75.0000 mg | ORAL_TABLET | Freq: Every day | ORAL | Status: DC
  Administered 2022-08-27 – 2022-09-02 (×7): 75 mg via ORAL
  Filled 2022-08-27 (×7): qty 1

## 2022-08-27 NOTE — H&P (Signed)
Patient Demographics:    Stephen Graves, is a 66 y.o. male  MRN: [id number]   DOB - Oct 01, 1955  Admit Date - 08/27/2022  Outpatient Primary MD for the patient is [facility] [place]:   Assessment and Plan:  1)S/p Fall with right anterior fifth rib fracture and elevated CPK/dehydration CK 2,783 >>> 2,743 -Lactic acid 2.5 repeat 2.1 -Patient denies chest pains or syncope -Troponin 48 >> 41 -EKG is not acute -D-dimer is elevated however VQ scan is unremarkable --T. bili 4.5 AST of 51 otherwise LFTs WNL -Chest x-ray without acute findings -CT head and brain MRI without acute findings CT C-spine without acute findings CT abdomen pelvis without acute findings -CT chest with acute nondisplaced fracture of the right anterior fifth rib -iv Fluids as ordered -Hold Crestor due to elevated CPKs -Get PT eval  2) H/o chronic anemia---- hemoglobin 13.2 which is higher than baseline suspect some degree of hemoconcentration- -anticipate that hemoglobin will drop with hydration/hemodilution  3) mild thrombocytopenia ---platelets 144, no bleeding concerns at this time  4)CKD stage - 3A -Creatinine 1.46 which is close to baseline -renally adjust medications, avoid nephrotoxic agents / dehydration  / hypotension  5)COVID-19 infection --no significant hypoxia -Treat empirically with Paxlovid -No Covid pneumonia on chest imaging studies hold off on steroids --Check and trend inflammatory markers including D-dimer, ferritin and  CRP---also follow CBC and CMP --Supplemental oxygen to keep O2 sats above 93% ---- Encourage prone positioning  if able to tolerate --Attempt to maintain euvolemic state --Zinc and vitamin C as ordered -Albuterol inhaler as needed -Accu-Cheks/fingersticks while  on high-dose steroids  6)Hx of CAD s/p stent to LAD: Patient had LHC in September 2023  CTO treated with successful PCI/DES overlapping previously placed stent. Planned for DAPT with ASA/Brilinta for at least one year, likely lifelong given extensive stenting in the LAD. No recurrent chest pain. Seen by CR.  -- continue on ASA, plavix, Coreg 6.'25mg'$  BID,  -Prior echo showed  EF 60-65% with no rWMA noted, severe biatrial enlargement -Repeat echo pending -Hold Crestor due to elevated CPKs -Hold losartan due to concerns about risk of AKI on CKD given dehydration   7)DM2- Hgb A1c 6.4 reflecting fair diabetic control PTA -Hold metformin Use Novolog/Humalog Sliding scale insulin with Accu-Cheks/Fingersticks as ordered    8)Anxiety: c/n  Celexa  Dispo: The patient is from: Home              Anticipated d/c is to: SNF              Anticipated d/c date is: 1 day              Patient currently is not medically stable to d/c. Barriers: Not Clinically Stable-    With History of - Reviewed by me  Past Medical History:  Diagnosis Date   Anxiety    Coronary artery disease involving native coronary artery of native  heart with unstable angina pectoris ([place]) 07/18/2017   Cardiac cath 1020 1418: Mid LAD to Dist LAD lesion, 99 %stenosed. - PCI with resolute DES (2.5 X 26 mm Resolute Onyx stent - 2.75 mm);ostial LAD 30%. Ostial ramus 40%. Proximal to mid LAD 50%. Normal LV function and normal LVEDP.    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   Skull fracture [facility])       Past Surgical History:  Procedure Laterality Date   CORONARY STENT INTERVENTION N/A 07/17/2017   Procedure: CORONARY STENT INTERVENTION;  Surgeon: Wellington Hampshire, MD;  Location: [place] [facility];  Service: Cardiovascular;  Laterality: N/A;   CORONARY STENT INTERVENTION N/A 05/30/2021   Procedure: CORONARY STENT INTERVENTION;  Surgeon: Leonie Man, MD;   Location: [place] [facility];  Service: Cardiovascular;  Laterality: N/A;   FOOT SURGERY Left    KNEE ARTHROPLASTY Left 07/30/2018   Procedure: LEFT TOTAL KNEE ARTHROPLASTY WITH COMPUTER NAVIGATION;  Surgeon: Rod Can, MD;  Location: [facility];  Service: Orthopedics;  Laterality: Left;  Needs RNFA   KNEE ARTHROPLASTY Right 11/02/2020   Procedure: COMPUTER ASSISTED TOTAL KNEE ARTHROPLASTY;  Surgeon: Rod Can, MD;  Location: [facility];  Service: Orthopedics;  Laterality: Right;  18mn   LEFT HEART CATH AND CORONARY ANGIOGRAPHY N/A 07/17/2017   Procedure: LEFT HEART CATH AND CORONARY ANGIOGRAPHY;  Surgeon: AWellington Hampshire MD;  [place] [facility];  Service: Cardiovascular;  Laterality: N/A;   LEFT HEART CATH AND CORONARY ANGIOGRAPHY N/A 05/30/2021   Procedure: LEFT HEART CATH AND CORONARY ANGIOGRAPHY;  Surgeon: HLeonie Man MD;  [place] [facility];  Service: Cardiovascular;  Laterality: N/A;   left wrist surgery     SKIN GRAFT  1998   TONSILLECTOMY        Chief Complaint  Patient presents with   Fall      HPI:    CGiorgi Graves is a 66y.o. male  with PMH of CAD s/p DES to mid-distal LAD in 06/2017, chronic chest pain, hypertension, hyperlipidemia, anxiety presents to the ED via EMS after falling twice at home since last night.  Patient was found by his brother around 896am on 08/27/2022,  -Apparently fell for the second time and laid on his belly could not get up unfortunately his belly was lying against his heater vent---some erythema over the anterior abdomen is noted - Patient does not think he lost consciousness -Denies frank chest pains or palpitations dizziness - No Nausea, Vomiting or Diarrhea  -In the ED patient is found to be positive for COVID-19 infection  Troponin 48 >> 41 Lactic acid 2.5 repeat 2.1 -UA is not consistent with UTI -D-dimer is elevated however VQ scan is unremarkable CK 2,783 >>> 2,743 WBC 9.7 hemoglobin 13.2 which is  higher than baseline suspect some degree of hemoconcentration- -platelets 144 Creatinine 1.46 which is close to baseline -T. bili 4.5 AST of 51 otherwise LFTs WNL -Chest x-ray without acute findings -CT head and brain MRI without acute findings CT C-spine without acute findings CT abdomen pelvis without acute findings -CT chest with acute nondisplaced fracture of the right anterior fifth rib -EKG is nonacute  Review of systems:    In addition to the HPI above,   A full Review of  Systems was done, all other systems reviewed are negative except as noted above in HPI , .  Social History:  Reviewed by me    Social History   Tobacco Use   Smoking status: Former    Types: Cigarettes    Quit date: 09/24/1988    Years since quitting: 33.9   Smokeless tobacco: Never   Tobacco comments:    tobacco use - no  Substance Use Topics   Alcohol use: No       Family History :  Reviewed by me    Family History  Problem Relation Age of Onset   Stroke Father 22   Heart attack Father 41   CAD Mother    Prostate cancer Brother    Thyroid nodules Brother      Home Medications:   Prior to Admission medications   Medication Sig Start Date End Date Taking? Authorizing Provider  carvedilol (COREG) 3.125 MG tablet Take 1 tablet (3.125 mg total) by mouth 2 (two) times daily. Patient taking differently: Take 3.125 mg by mouth 2 (two) times daily with a meal. 10/31/21  Yes Branch, Alphonse Guild, MD  Cholecalciferol (VITAMIN D3) 25 MCG (1000 UT) CAPS Take 1 capsule by mouth daily.   Yes [provider]  citalopram (CELEXA) 40 MG tablet Take 0.5 tablets (20 mg total) by mouth daily. 05/31/21  Yes Croitoru, Mihai, MD  clopidogrel (PLAVIX) 75 MG tablet Take 1 tablet (75 mg total) by mouth daily. 05/07/22  Yes BranchAlphonse Guild, MD  fish oil-omega-3 fatty acids 1000 MG capsule Take 1 g by mouth daily.   Yes [provider]  losartan (COZAAR) 50 MG tablet Take 0.5 tablets (25 mg  total) by mouth in the morning and at bedtime. 07/10/21  Yes BranchAlphonse Guild, MD  metFORMIN (GLUCOPHAGE-XR) 500 MG 24 hr tablet Take 1 tablet by mouth daily.   Yes [provider]  nitroGLYCERIN (NITROSTAT) 0.4 MG SL tablet DISSOLVE ONE TABLET UNDER THE TONGUE EVERY 5 MINUTES AS NEEDED FOR CHEST PAIN.  DO NOT EXCEED A TOTAL OF 3 DOSES IN 15 MINUTES 05/31/21  Yes Reino Bellis B, NP  potassium chloride SA (K-DUR,KLOR-CON) 20 MEQ tablet Take 20 mEq by mouth daily.   Yes [provider]  rosuvastatin (CRESTOR) 20 MG tablet Take 1 tablet (20 mg total) by mouth daily. 05/24/22  Yes BranchAlphonse Guild, MD  aspirin EC 81 MG tablet Take 1 tablet (81 mg total) by mouth daily. Swallow whole. Patient not taking: Reported on 08/27/2022 05/23/21   Arnoldo Lenis, MD  chlorhexidine (HIBICLENS) 4 % external liquid Apply topically daily as needed. Patient not taking: Reported on 08/27/2022 11/13/21   Volney American, PA-C  mupirocin ointment (BACTROBAN) 2 % Apply 1 application topically 2 (two) times daily. Patient not taking: Reported on 08/27/2022 11/13/21   Volney American, PA-C  ticagrelor (BRILINTA) 90 MG TABS tablet Take 1 tablet (90 mg total) by mouth 2 (two) times daily. Patient not taking: Reported on 08/27/2022 06/02/21   Pixie Casino, MD     Allergies:     Allergies  Allergen Reactions   Lipitor [Atorvastatin]     Pain in neck and legs after taking medication     Physical Exam:   Vitals  Blood pressure 123/87, pulse 100, temperature 98.1 F (36.7 C), temperature source Oral, resp. rate 19, height '5\' 11"'$  (1.803 m), weight 97.1 kg, SpO2 100 %.  Physical Examination: General appearance - alert,  in no distress  Mental status - alert, oriented to person, place, and time,  Eyes - sclera anicteric Neck -  supple, no JVD elevation , Chest - clear  to auscultation bilaterally, symmetrical air movement,  Heart - S1 and S2 normal, regular  Abdomen - soft,  nontender, nondistended, +BS, anterior abdominal wall erythema without significant burn injury or deep tissue involvement  neurological - screening mental status exam normal, neck supple without rigidity, cranial nerves II through XII intact, DTR's normal and symmetric Extremities - no pedal edema noted, intact peripheral pulses  Skin - warm, dry     Data Review:    CBC Recent Labs  Lab 08/27/22 0915  WBC 9.7  HGB 13.2  HCT 41.7  PLT 144*  MCV 88.7  MCH 28.1  MCHC 31.7  RDW 14.8  LYMPHSABS 0.5*  MONOABS 0.6  EOSABS 0.0  BASOSABS 0.0   ------------------------------------------------------------------------------------------------------------------  Chemistries  Recent Labs  Lab 08/27/22 0915  NA 136  K 4.0  CL 103  CO2 22  GLUCOSE 174*  BUN 35*  CREATININE 1.46*  CALCIUM 9.1  MG 2.1  AST 51*  ALT 21  ALKPHOS 68  BILITOT 1.5*   ------------------------------------------------------------------------------------------------------------------ estimated creatinine clearance is 57.5 mL/min (A) (by C-G formula based on SCr of 1.46 mg/dL (H)). ------------------------------------------------------------------------------------------------------------------ No results for input(s): "TSH", "T4TOTAL", "T3FREE", "THYROIDAB" in the last 72 hours.  Invalid input(s): "FREET3"   Coagulation profile Recent Labs  Lab 08/27/22 0915  INR 1.3*   ------------------------------------------------------------------------------------------------------------------- Recent Labs    08/27/22 0915  DDIMER 2.25*   -------------------------------------------------------------------------------------------------------------------  Cardiac Enzymes No results for input(s): "CKMB", "TROPONINI", "MYOGLOBIN" in the last 168 hours.  Invalid input(s): "CK" ------------------------------------------------------------------------------------------------------------------ No results  found for: "BNP"   ---------------------------------------------------------------------------------------------------------------  Urinalysis    Component Value Date/Time   COLORURINE YELLOW 08/27/2022 0932   APPEARANCEUR HAZY (A) 08/27/2022 0932   LABSPEC 1.029 08/27/2022 0932   PHURINE 5.0 08/27/2022 0932   GLUCOSEU NEGATIVE 08/27/2022 0932   HGBUR LARGE (A) 08/27/2022 0932   BILIRUBINUR NEGATIVE 08/27/2022 0932   KETONESUR 5 (A) 08/27/2022 0932   PROTEINUR 100 (A) 08/27/2022 0932   NITRITE NEGATIVE 08/27/2022 0932   LEUKOCYTESUR NEGATIVE 08/27/2022 0932    ----------------------------------------------------------------------------------------------------------------   Imaging Results:    NM Pulmonary Perfusion  Result Date: 08/27/2022 CLINICAL DATA:  Elevated D-dimer level, chest pain for 1 day EXAM: NUCLEAR MEDICINE PERFUSION LUNG SCAN TECHNIQUE: Perfusion images were obtained in multiple projections after intravenous injection of radiopharmaceutical. Ventilation scans intentionally deferred if perfusion scan and chest x-ray adequate for interpretation during COVID 19 epidemic. RADIOPHARMACEUTICALS:  4.4 mCi Tc-77mMAA IV COMPARISON:  Chest CT 08/27/2022 and chest radiograph 08/27/2022 FINDINGS: No perfusion defect is identified in the lungs to suggest pulmonary embolus. Overall distribution of radiopharmaceutical is normal. IMPRESSION: 1. Normal perfusion lung scan, without findings to suggest acute pulmonary embolus. Electronically Signed   By: WVan ClinesM.D.   On: 08/27/2022 18:22   MR BRAIN WO CONTRAST  Result Date: 08/27/2022 CLINICAL DATA:  Altered mental status. EXAM: MRI HEAD WITHOUT CONTRAST TECHNIQUE: Multiplanar, multiecho pulse sequences of the brain and surrounding structures were obtained without intravenous contrast. COMPARISON:  None Available. FINDINGS: Brain: No acute infarction, hemorrhage, hydrocephalus, extra-axial collection or mass lesion.  Encephalomalacia and volume loss in the anterior left temporal lobe (series 14, image 8), likely related to prior infarct. There is sequela of mild chronic microvascular ischemic change. Vascular: Normal flow voids. Skull and upper cervical spine: Normal marrow signal. Sinuses/Orbits: Mild mucosal thickening in the bilateral maxillary sinuses. Asymmetric right-sided mastoid effusion Other: None. IMPRESSION: 1. No acute intracranial process. 2. 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 Electronically Signed   By: Marin Roberts M.D.   On: 08/27/2022 16:44   CT HEAD WO CONTRAST  Result Date: 08/27/2022 CLINICAL DATA:  Unwitnessed fall, found down EXAM: CT HEAD WITHOUT CONTRAST CT CERVICAL SPINE WITHOUT CONTRAST TECHNIQUE: Multidetector CT imaging of the head and cervical spine was performed following the standard protocol without intravenous contrast. Multiplanar CT image reconstructions of the cervical spine were also generated. RADIATION DOSE REDUCTION: This exam was performed according to the departmental dose-optimization program which includes automated exposure control, adjustment of the mA and/or kV according to patient size and/or use of iterative reconstruction technique. COMPARISON:  CT head and cervical spine 09/08/2021 FINDINGS: CT HEAD FINDINGS Brain: There is no acute intracranial hemorrhage, extra-axial fluid collection, or acute infarct. Parenchymal volume is normal for age. The ventricles are stable in size. 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 Gray-white differentiation is otherwise preserved. There is no mass lesion.  There is no mass effect or midline shift. Vascular: No hyperdense vessel or unexpected calcification. Skull: Normal. Negative for fracture or focal lesion. Sinuses/Orbits: The imaged paranasal sinuses are clear. The globes and orbits are unremarkable. Other: None. CT CERVICAL SPINE FINDINGS Alignment: There is  reversal of the normal cervical lordosis. There is trace anterolisthesis of C5 on C6, unchanged. There is no other antero or retrolisthesis. There is no jumped or perched facet or other evidence of traumatic malalignment. Skull base and vertebrae: Skull base alignment is maintained. Vertebral body heights are preserved. There is no evidence of acute fracture. There is no suspicious osseous lesion. Soft tissues and spinal canal: No prevertebral fluid or swelling. No visible canal hematoma. Disc levels: There is disc space narrowing and degenerative endplate change most advanced at C6-C7. There is multilevel facet arthropathy there is no high-grade spinal canal stenosis. Upper chest: The imaged lung apices are clear. Other: None. IMPRESSION: 1. No acute intracranial pathology. 2. No acute fracture or traumatic malalignment of the cervical spine. Electronically Signed   By: Valetta Mole M.D.   On: 08/27/2022 12:12   CT CERVICAL SPINE WO CONTRAST  Result Date: 08/27/2022 CLINICAL DATA:  Unwitnessed fall, found down EXAM: CT HEAD WITHOUT CONTRAST CT CERVICAL SPINE WITHOUT CONTRAST TECHNIQUE: Multidetector CT imaging of the head and cervical spine was performed following the standard protocol without intravenous contrast. Multiplanar CT image reconstructions of the cervical spine were also generated. RADIATION DOSE REDUCTION: This exam was performed according to the departmental dose-optimization program which includes automated exposure control, adjustment of the mA and/or kV according to patient size and/or use of iterative reconstruction technique. COMPARISON:  CT head and cervical spine 09/08/2021 FINDINGS: CT HEAD FINDINGS Brain: There is no acute intracranial hemorrhage, extra-axial fluid collection, or acute infarct. Parenchymal volume is normal for age. The ventricles are stable in size. 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 Gray-white differentiation is otherwise preserved. There is no mass  lesion.  There is no mass effect or midline shift. Vascular: No hyperdense vessel or unexpected calcification. Skull: Normal. Negative for fracture or focal lesion. Sinuses/Orbits: The imaged paranasal sinuses are clear. The globes and orbits are unremarkable. Other: None. CT CERVICAL SPINE FINDINGS Alignment: There is reversal of the normal cervical lordosis. There is trace anterolisthesis of C5 on C6, unchanged. There is no other antero or retrolisthesis. There is no jumped or perched facet or other evidence of traumatic malalignment. Skull base and vertebrae: Skull base alignment  is maintained. Vertebral body heights are preserved. There is no evidence of acute fracture. There is no suspicious osseous lesion. Soft tissues and spinal canal: No prevertebral fluid or swelling. No visible canal hematoma. Disc levels: There is disc space narrowing and degenerative endplate change most advanced at C6-C7. There is multilevel facet arthropathy there is no high-grade spinal canal stenosis. Upper chest: The imaged lung apices are clear. Other: None. IMPRESSION: 1. No acute intracranial pathology. 2. No acute fracture or traumatic malalignment of the cervical spine. Electronically Signed   By: Valetta Mole M.D.   On: 08/27/2022 12:12   CT CHEST ABDOMEN PELVIS WO CONTRAST  Result Date: 08/27/2022 CLINICAL DATA:  Unwitnessed fall, found down. EXAM: CT CHEST, ABDOMEN AND PELVIS WITHOUT CONTRAST TECHNIQUE: Multidetector CT imaging of the chest, abdomen and pelvis was performed following the standard protocol without IV contrast. RADIATION DOSE REDUCTION: This exam was performed according to the departmental dose-optimization program which includes automated exposure control, adjustment of the mA and/or kV according to patient size and/or use of iterative reconstruction technique. COMPARISON:  Same day chest radiograph FINDINGS: CT CHEST FINDINGS Cardiovascular: The heart size is normal. There is a trace pericardial effusion  dense coronary artery calcifications are seen primarily affecting the left anterior descending artery. There is minimal calcified plaque in the thoracic aorta. Mediastinum/Nodes: The thyroid is unremarkable. The esophagus is grossly unremarkable. There is no mediastinal or axillary lymphadenopathy. There is no bulky hilar adenopathy, within the confines of noncontrast technique. Lungs/Pleura: The trachea and central airways are patent. There is no focal consolidation or pulmonary edema. There is no pleural effusion or pneumothorax. There are no suspicious nodules. Musculoskeletal: There is an acute nondisplaced fracture of the right anterior fifth rib (5-104). No other rib fracture is seen. There is no sternal fracture. There is no acute fracture or traumatic malalignment of the thoracic spine. There is multilevel degenerative change of the thoracic spine with flowing anterior osteophytes. CT ABDOMEN PELVIS FINDINGS Hepatobiliary: The liver and gallbladder are unremarkable. There is no biliary ductal dilatation. Pancreas: Unremarkable. Spleen: Unremarkable. Adrenals/Urinary Tract: The adrenals are unremarkable. The kidneys are unremarkable, with no focal lesion, stone, hydronephrosis, or hydroureter. The bladder is unremarkable. Stomach/Bowel: The stomach is unremarkable. There is no evidence of bowel obstruction. There is no abnormal bowel wall thickening or inflammatory change. There is colonic diverticulosis without evidence of acute diverticulitis. The appendix is normal. Vascular/Lymphatic: There is mild calcified plaque in the nonaneurysmal abdominal aorta. There is no abdominal or pelvic lymphadenopathy. Reproductive: The prostate and seminal vesicles are unremarkable. Other: There is no ascites or free air. Musculoskeletal: There is no acute fracture in the pelvis. There is no acute fracture or traumatic malalignment of the lumbar spine. There is multilevel degenerative change. IMPRESSION: 1. Acute  nondisplaced fracture of the right anterior fifth rib. 2. No other evidence of acute pathology in the chest, abdomen, or pelvis. 3. Trace pericardial effusion. 4. Dense coronary artery calcifications primarily affecting the left anterior descending artery. 5. Diverticulosis without evidence of acute diverticulitis. Electronically Signed   By: Valetta Mole M.D.   On: 08/27/2022 12:05   DG Chest Port 1 View  Result Date: 08/27/2022 CLINICAL DATA:  Two falls last night EXAM: PORTABLE CHEST 1 VIEW COMPARISON:  Chest radiograph 05/28/2021 FINDINGS: The cardiomediastinal silhouette is stable and within normal limits allowing for leftward patient rotation. There is no focal consolidation or pulmonary edema. There is no pleural effusion or pneumothorax. No displaced rib fracture or other acute osseous  abnormality is identified. IMPRESSION: Stable chest with no radiographic evidence of acute cardiopulmonary process. Electronically Signed   By: Valetta Mole M.D.   On: 08/27/2022 10:29    Radiological Exams on Admission: NM Pulmonary Perfusion  Result Date: 08/27/2022 CLINICAL DATA:  Elevated D-dimer level, chest pain for 1 day EXAM: NUCLEAR MEDICINE PERFUSION LUNG SCAN TECHNIQUE: Perfusion images were obtained in multiple projections after intravenous injection of radiopharmaceutical. Ventilation scans intentionally deferred if perfusion scan and chest x-ray adequate for interpretation during COVID 19 epidemic. RADIOPHARMACEUTICALS:  4.4 mCi Tc-30mMAA IV COMPARISON:  Chest CT 08/27/2022 and chest radiograph 08/27/2022 FINDINGS: No perfusion defect is identified in the lungs to suggest pulmonary embolus. Overall distribution of radiopharmaceutical is normal. IMPRESSION: 1. Normal perfusion lung scan, without findings to suggest acute pulmonary embolus. Electronically Signed   By: WVan ClinesM.D.   On: 08/27/2022 18:22   MR BRAIN WO CONTRAST  Result Date: 08/27/2022 CLINICAL DATA:  Altered mental status.  EXAM: MRI HEAD WITHOUT CONTRAST TECHNIQUE: Multiplanar, multiecho pulse sequences of the brain and surrounding structures were obtained without intravenous contrast. COMPARISON:  None Available. FINDINGS: Brain: No acute infarction, hemorrhage, hydrocephalus, extra-axial collection or mass lesion. Encephalomalacia and volume loss in the anterior left temporal lobe (series 14, image 8), likely related to prior infarct. There is sequela of mild chronic microvascular ischemic change. Vascular: Normal flow voids. Skull and upper cervical spine: Normal marrow signal. Sinuses/Orbits: Mild mucosal thickening in the bilateral maxillary sinuses. Asymmetric right-sided mastoid effusion Other: None. IMPRESSION: 1. No acute intracranial process. 2. 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 Electronically Signed   By: HMarin RobertsM.D.   On: 08/27/2022 16:44   CT HEAD WO CONTRAST  Result Date: 08/27/2022 CLINICAL DATA:  Unwitnessed fall, found down EXAM: CT HEAD WITHOUT CONTRAST CT CERVICAL SPINE WITHOUT CONTRAST TECHNIQUE: Multidetector CT imaging of the head and cervical spine was performed following the standard protocol without intravenous contrast. Multiplanar CT image reconstructions of the cervical spine were also generated. RADIATION DOSE REDUCTION: This exam was performed according to the departmental dose-optimization program which includes automated exposure control, adjustment of the mA and/or kV according to patient size and/or use of iterative reconstruction technique. COMPARISON:  CT head and cervical spine 09/08/2021 FINDINGS: CT HEAD FINDINGS Brain: There is no acute intracranial hemorrhage, extra-axial fluid collection, or acute infarct. Parenchymal volume is normal for age. The ventricles are stable in size. 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 Gray-white differentiation is otherwise preserved. There is no mass lesion.  There is no mass  effect or midline shift. Vascular: No hyperdense vessel or unexpected calcification. Skull: Normal. Negative for fracture or focal lesion. Sinuses/Orbits: The imaged paranasal sinuses are clear. The globes and orbits are unremarkable. Other: None. CT CERVICAL SPINE FINDINGS Alignment: There is reversal of the normal cervical lordosis. There is trace anterolisthesis of C5 on C6, unchanged. There is no other antero or retrolisthesis. There is no jumped or perched facet or other evidence of traumatic malalignment. Skull base and vertebrae: Skull base alignment is maintained. Vertebral body heights are preserved. There is no evidence of acute fracture. There is no suspicious osseous lesion. Soft tissues and spinal canal: No prevertebral fluid or swelling. No visible canal hematoma. Disc levels: There is disc space narrowing and degenerative endplate change most advanced at C6-C7. There is multilevel facet arthropathy there is no high-grade spinal canal stenosis. Upper chest: The imaged lung apices are clear. Other:  None. IMPRESSION: 1. No acute intracranial pathology. 2. No acute fracture or traumatic malalignment of the cervical spine. Electronically Signed   By: Valetta Mole M.D.   On: 08/27/2022 12:12   CT CERVICAL SPINE WO CONTRAST  Result Date: 08/27/2022 CLINICAL DATA:  Unwitnessed fall, found down EXAM: CT HEAD WITHOUT CONTRAST CT CERVICAL SPINE WITHOUT CONTRAST TECHNIQUE: Multidetector CT imaging of the head and cervical spine was performed following the standard protocol without intravenous contrast. Multiplanar CT image reconstructions of the cervical spine were also generated. RADIATION DOSE REDUCTION: This exam was performed according to the departmental dose-optimization program which includes automated exposure control, adjustment of the mA and/or kV according to patient size and/or use of iterative reconstruction technique. COMPARISON:  CT head and cervical spine 09/08/2021 FINDINGS: CT HEAD FINDINGS  Brain: There is no acute intracranial hemorrhage, extra-axial fluid collection, or acute infarct. Parenchymal volume is normal for age. The ventricles are stable in size. 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 Gray-white differentiation is otherwise preserved. There is no mass lesion.  There is no mass effect or midline shift. Vascular: No hyperdense vessel or unexpected calcification. Skull: Normal. Negative for fracture or focal lesion. Sinuses/Orbits: The imaged paranasal sinuses are clear. The globes and orbits are unremarkable. Other: None. CT CERVICAL SPINE FINDINGS Alignment: There is reversal of the normal cervical lordosis. There is trace anterolisthesis of C5 on C6, unchanged. There is no other antero or retrolisthesis. There is no jumped or perched facet or other evidence of traumatic malalignment. Skull base and vertebrae: Skull base alignment is maintained. Vertebral body heights are preserved. There is no evidence of acute fracture. There is no suspicious osseous lesion. Soft tissues and spinal canal: No prevertebral fluid or swelling. No visible canal hematoma. Disc levels: There is disc space narrowing and degenerative endplate change most advanced at C6-C7. There is multilevel facet arthropathy there is no high-grade spinal canal stenosis. Upper chest: The imaged lung apices are clear. Other: None. IMPRESSION: 1. No acute intracranial pathology. 2. No acute fracture or traumatic malalignment of the cervical spine. Electronically Signed   By: Valetta Mole M.D.   On: 08/27/2022 12:12   CT CHEST ABDOMEN PELVIS WO CONTRAST  Result Date: 08/27/2022 CLINICAL DATA:  Unwitnessed fall, found down. EXAM: CT CHEST, ABDOMEN AND PELVIS WITHOUT CONTRAST TECHNIQUE: Multidetector CT imaging of the chest, abdomen and pelvis was performed following the standard protocol without IV contrast. RADIATION DOSE REDUCTION: This exam was performed according to the departmental dose-optimization program  which includes automated exposure control, adjustment of the mA and/or kV according to patient size and/or use of iterative reconstruction technique. COMPARISON:  Same day chest radiograph FINDINGS: CT CHEST FINDINGS Cardiovascular: The heart size is normal. There is a trace pericardial effusion dense coronary artery calcifications are seen primarily affecting the left anterior descending artery. There is minimal calcified plaque in the thoracic aorta. Mediastinum/Nodes: The thyroid is unremarkable. The esophagus is grossly unremarkable. There is no mediastinal or axillary lymphadenopathy. There is no bulky hilar adenopathy, within the confines of noncontrast technique. Lungs/Pleura: The trachea and central airways are patent. There is no focal consolidation or pulmonary edema. There is no pleural effusion or pneumothorax. There are no suspicious nodules. Musculoskeletal: There is an acute nondisplaced fracture of the right anterior fifth rib (5-104). No other rib fracture is seen. There is no sternal fracture. There is no acute fracture or traumatic malalignment of the thoracic spine. There is multilevel degenerative change of the thoracic spine with flowing  anterior osteophytes. CT ABDOMEN PELVIS FINDINGS Hepatobiliary: The liver and gallbladder are unremarkable. There is no biliary ductal dilatation. Pancreas: Unremarkable. Spleen: Unremarkable. Adrenals/Urinary Tract: The adrenals are unremarkable. The kidneys are unremarkable, with no focal lesion, stone, hydronephrosis, or hydroureter. The bladder is unremarkable. Stomach/Bowel: The stomach is unremarkable. There is no evidence of bowel obstruction. There is no abnormal bowel wall thickening or inflammatory change. There is colonic diverticulosis without evidence of acute diverticulitis. The appendix is normal. Vascular/Lymphatic: There is mild calcified plaque in the nonaneurysmal abdominal aorta. There is no abdominal or pelvic lymphadenopathy. Reproductive:  The prostate and seminal vesicles are unremarkable. Other: There is no ascites or free air. Musculoskeletal: There is no acute fracture in the pelvis. There is no acute fracture or traumatic malalignment of the lumbar spine. There is multilevel degenerative change. IMPRESSION: 1. Acute nondisplaced fracture of the right anterior fifth rib. 2. No other evidence of acute pathology in the chest, abdomen, or pelvis. 3. Trace pericardial effusion. 4. Dense coronary artery calcifications primarily affecting the left anterior descending artery. 5. Diverticulosis without evidence of acute diverticulitis. Electronically Signed   By: Valetta Mole M.D.   On: 08/27/2022 12:05   DG Chest Port 1 View  Result Date: 08/27/2022 CLINICAL DATA:  Two falls last night EXAM: PORTABLE CHEST 1 VIEW COMPARISON:  Chest radiograph 05/28/2021 FINDINGS: The cardiomediastinal silhouette is stable and within normal limits allowing for leftward patient rotation. There is no focal consolidation or pulmonary edema. There is no pleural effusion or pneumothorax. No displaced rib fracture or other acute osseous abnormality is identified. IMPRESSION: Stable chest with no radiographic evidence of acute cardiopulmonary process. Electronically Signed   By: Valetta Mole M.D.   On: 08/27/2022 10:29    DVT Prophylaxis -SCD  /Heparin AM Labs Ordered, also please review Full Orders  Family Communication: Admission, patients condition and plan of care including tests being ordered have been discussed with the patient and brother who indicate understanding and agree with the plan   Condition   stable  Roxan Hockey M.D on 08/27/2022 at 6:48 PM Go to [email or web address] -  for contact info  [facility] - Office  [phone]

## 2022-08-27 NOTE — ED Notes (Signed)
CBG '@2140'$  was 153 Monitor not crossing

## 2022-08-27 NOTE — Progress Notes (Signed)
Patient is in MRI. Will do echo in the am.

## 2022-08-27 NOTE — ED Provider Notes (Signed)
[facility] EMERGENCY DEPARTMENT Provider Note   CSN: [id number] Arrival date & time: 08/27/22  0160     History  Chief Complaint  Patient presents with   Stephen Graves is a 66 y.o. male.   Fall  Patient presents after a fall.  Medical history includes HTN, HLD, CAD, arthritis, anxiety, prior skull fracture, prior right total knee replacement.  Currently, he lives alone.  At baseline, he ambulates without a walker or cane.  He has had increasing difficulty walking over the past 3 days.  He attributes this to worsened chronic pain in his back and neck.  Yesterday, he suffered 2 mechanical falls.  The second fall occurred at approximately 9:30 PM.  Patient fell forward, landing on the floor.  Due to pain and generalized weakness, he was unable to get up.  He laid on the floor throughout the night.  His brother found him in this position this morning at 8:30 AM.  He was positioned over a heating vent on the floor.  This did cause some areas of mild burn to his abdomen.  Patient endorses bilateral rib pain which she attributes to the falls.     Home Medications Prior to Admission medications   Medication Sig Start Date End Date Taking? Authorizing Provider  carvedilol (COREG) 3.125 MG tablet Take 1 tablet (3.125 mg total) by mouth 2 (two) times daily. Patient taking differently: Take 3.125 mg by mouth 2 (two) times daily with a meal. 10/31/21  Yes Branch, Alphonse Guild, MD  Cholecalciferol (VITAMIN D3) 25 MCG (1000 UT) CAPS Take 1 capsule by mouth daily.   Yes [provider]  citalopram (CELEXA) 40 MG tablet Take 0.5 tablets (20 mg total) by mouth daily. 05/31/21  Yes Croitoru, Mihai, MD  clopidogrel (PLAVIX) 75 MG tablet Take 1 tablet (75 mg total) by mouth daily. 05/07/22  Yes BranchAlphonse Guild, MD  fish oil-omega-3 fatty acids 1000 MG capsule Take 1 g by mouth daily.   Yes [provider]  losartan (COZAAR) 50 MG tablet Take 0.5 tablets (25 mg total) by mouth  in the morning and at bedtime. 07/10/21  Yes BranchAlphonse Guild, MD  metFORMIN (GLUCOPHAGE-XR) 500 MG 24 hr tablet Take 1 tablet by mouth daily.   Yes [provider]  nitroGLYCERIN (NITROSTAT) 0.4 MG SL tablet DISSOLVE ONE TABLET UNDER THE TONGUE EVERY 5 MINUTES AS NEEDED FOR CHEST PAIN.  DO NOT EXCEED A TOTAL OF 3 DOSES IN 15 MINUTES 05/31/21  Yes Reino Bellis B, NP  potassium chloride SA (K-DUR,KLOR-CON) 20 MEQ tablet Take 20 mEq by mouth daily.   Yes [provider]  rosuvastatin (CRESTOR) 20 MG tablet Take 1 tablet (20 mg total) by mouth daily. 05/24/22  Yes BranchAlphonse Guild, MD  aspirin EC 81 MG tablet Take 1 tablet (81 mg total) by mouth daily. Swallow whole. Patient not taking: Reported on 08/27/2022 05/23/21   Arnoldo Lenis, MD  chlorhexidine (HIBICLENS) 4 % external liquid Apply topically daily as needed. Patient not taking: Reported on 08/27/2022 11/13/21   Volney American, PA-C  mupirocin ointment (BACTROBAN) 2 % Apply 1 application topically 2 (two) times daily. Patient not taking: Reported on 08/27/2022 11/13/21   Volney American, PA-C  ticagrelor (BRILINTA) 90 MG TABS tablet Take 1 tablet (90 mg total) by mouth 2 (two) times daily. Patient not taking: Reported on 08/27/2022 06/02/21   Pixie Casino, MD      Allergies  Lipitor [atorvastatin]    Review of Systems   Review of Systems  Constitutional:  Positive for fatigue.  Musculoskeletal:  Positive for gait problem.  Skin:  Positive for wound.  Neurological:  Positive for weakness (Generalized).  All other systems reviewed and are negative.   Physical Exam Updated Vital Signs BP (!) 154/103   Pulse 93   Temp 98.1 F (36.7 C) (Oral)   Resp 20   Ht '5\' 11"'$  (1.803 m)   Wt 97.1 kg   SpO2 98%   BMI 29.85 kg/m  Physical Exam Vitals and nursing note reviewed.  Constitutional:      General: He is not in acute distress.    Appearance: Normal appearance. He is well-developed. He is  ill-appearing. He is not toxic-appearing or diaphoretic.  HENT:     Head: Normocephalic and atraumatic.     Right Ear: External ear normal.     Left Ear: External ear normal.     Nose: Nose normal.     Mouth/Throat:     Mouth: Mucous membranes are moist.     Pharynx: Oropharynx is clear.  Eyes:     Extraocular Movements: Extraocular movements intact.     Conjunctiva/sclera: Conjunctivae normal.  Cardiovascular:     Rate and Rhythm: Regular rhythm. Tachycardia present.     Heart sounds: No murmur heard. Pulmonary:     Effort: Pulmonary effort is normal. No respiratory distress.     Breath sounds: Normal breath sounds. No wheezing or rales.  Chest:     Chest wall: No tenderness.  Abdominal:     General: There is no distension.     Palpations: Abdomen is soft.     Tenderness: There is no abdominal tenderness.  Musculoskeletal:        General: Tenderness (Bilateral wrists) present. No swelling or deformity. Normal range of motion.     Cervical back: Normal range of motion and neck supple.     Right lower leg: No edema.     Left lower leg: No edema.  Skin:    General: Skin is warm and dry.     Capillary Refill: Capillary refill takes less than 2 seconds.     Coloration: Skin is not jaundiced or pale.     Findings: Erythema (Left side of abdomen) present.  Neurological:     General: No focal deficit present.     Mental Status: He is alert and oriented to person, place, and time.     Cranial Nerves: No cranial nerve deficit.     Sensory: No sensory deficit.     Motor: No weakness.     Coordination: Coordination normal.  Psychiatric:        Mood and Affect: Mood and affect normal.        Speech: Speech normal.        Behavior: Behavior is slowed. Behavior is cooperative.        Thought Content: Thought content normal.     ED Results / Procedures / Treatments   Labs (all labs ordered are listed, but only abnormal results are displayed) Labs Reviewed  RESP PANEL BY RT-PCR  (FLU A&B, COVID) ARPGX2 - Abnormal; Notable for the following components:      Result Value   SARS Coronavirus 2 by RT PCR POSITIVE (*)    All other components within normal limits  LACTIC ACID, PLASMA - Abnormal; Notable for the following components:   Lactic Acid, Venous 2.5 (*)    All other components  within normal limits  LACTIC ACID, PLASMA - Abnormal; Notable for the following components:   Lactic Acid, Venous 2.1 (*)    All other components within normal limits  COMPREHENSIVE METABOLIC PANEL - Abnormal; Notable for the following components:   Glucose, Bld 174 (*)    BUN 35 (*)    Creatinine, Ser 1.46 (*)    AST 51 (*)    Total Bilirubin 1.5 (*)    GFR, Estimated 52 (*)    All other components within normal limits  CBC WITH DIFFERENTIAL/PLATELET - Abnormal; Notable for the following components:   Platelets 144 (*)    Neutro Abs 8.6 (*)    Lymphs Abs 0.5 (*)    All other components within normal limits  PROTIME-INR - Abnormal; Notable for the following components:   Prothrombin Time 16.1 (*)    INR 1.3 (*)    All other components within normal limits  URINALYSIS, ROUTINE W REFLEX MICROSCOPIC - Abnormal; Notable for the following components:   APPearance HAZY (*)    Hgb urine dipstick LARGE (*)    Ketones, ur 5 (*)    Protein, ur 100 (*)    Bacteria, UA RARE (*)    All other components within normal limits  BLOOD GAS, VENOUS - Abnormal; Notable for the following components:   pH, Ven 7.46 (*)    pCO2, Ven 41 (*)    Bicarbonate 29.2 (*)    Acid-Base Excess 4.7 (*)    All other components within normal limits  CK - Abnormal; Notable for the following components:   Total CK 2,783 (*)    All other components within normal limits  D-DIMER, QUANTITATIVE - Abnormal; Notable for the following components:   D-Dimer, Quant 2.25 (*)    All other components within normal limits  CK - Abnormal; Notable for the following components:   Total CK 2,743 (*)    All other components  within normal limits  TROPONIN I (HIGH SENSITIVITY) - Abnormal; Notable for the following components:   Troponin I (High Sensitivity) 48 (*)    All other components within normal limits  TROPONIN I (HIGH SENSITIVITY) - Abnormal; Notable for the following components:   Troponin I (High Sensitivity) 41 (*)    All other components within normal limits  CULTURE, BLOOD (ROUTINE X 2)  CULTURE, BLOOD (ROUTINE X 2)  URINE CULTURE  MAGNESIUM  ETHANOL  HEMOGLOBIN A1C  I-STAT CHEM 8, ED    EKG None  Radiology CT HEAD WO CONTRAST  Result Date: 08/27/2022 CLINICAL DATA:  Unwitnessed fall, found down EXAM: CT HEAD WITHOUT CONTRAST CT CERVICAL SPINE WITHOUT CONTRAST TECHNIQUE: Multidetector CT imaging of the head and cervical spine was performed following the standard protocol without intravenous contrast. Multiplanar CT image reconstructions of the cervical spine were also generated. RADIATION DOSE REDUCTION: This exam was performed according to the departmental dose-optimization program which includes automated exposure control, adjustment of the mA and/or kV according to patient size and/or use of iterative reconstruction technique. COMPARISON:  CT head and cervical spine 09/08/2021 FINDINGS: CT HEAD FINDINGS Brain: There is no acute intracranial hemorrhage, extra-axial fluid collection, or acute infarct. Parenchymal volume is normal for age. The ventricles are stable in size. 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 Gray-white differentiation is otherwise preserved. There is no mass lesion.  There is no mass effect or midline shift. Vascular: No hyperdense vessel or unexpected calcification. Skull: Normal. Negative for fracture or focal lesion. Sinuses/Orbits: The imaged paranasal sinuses are  clear. The globes and orbits are unremarkable. Other: None. CT CERVICAL SPINE FINDINGS Alignment: There is reversal of the normal cervical lordosis. There is trace anterolisthesis of C5 on C6,  unchanged. There is no other antero or retrolisthesis. There is no jumped or perched facet or other evidence of traumatic malalignment. Skull base and vertebrae: Skull base alignment is maintained. Vertebral body heights are preserved. There is no evidence of acute fracture. There is no suspicious osseous lesion. Soft tissues and spinal canal: No prevertebral fluid or swelling. No visible canal hematoma. Disc levels: There is disc space narrowing and degenerative endplate change most advanced at C6-C7. There is multilevel facet arthropathy there is no high-grade spinal canal stenosis. Upper chest: The imaged lung apices are clear. Other: None. IMPRESSION: 1. No acute intracranial pathology. 2. No acute fracture or traumatic malalignment of the cervical spine. Electronically Signed   By: Valetta Mole M.D.   On: 08/27/2022 12:12   CT CERVICAL SPINE WO CONTRAST  Result Date: 08/27/2022 CLINICAL DATA:  Unwitnessed fall, found down EXAM: CT HEAD WITHOUT CONTRAST CT CERVICAL SPINE WITHOUT CONTRAST TECHNIQUE: Multidetector CT imaging of the head and cervical spine was performed following the standard protocol without intravenous contrast. Multiplanar CT image reconstructions of the cervical spine were also generated. RADIATION DOSE REDUCTION: This exam was performed according to the departmental dose-optimization program which includes automated exposure control, adjustment of the mA and/or kV according to patient size and/or use of iterative reconstruction technique. COMPARISON:  CT head and cervical spine 09/08/2021 FINDINGS: CT HEAD FINDINGS Brain: There is no acute intracranial hemorrhage, extra-axial fluid collection, or acute infarct. Parenchymal volume is normal for age. The ventricles are stable in size. 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 Gray-white differentiation is otherwise preserved. There is no mass lesion.  There is no mass effect or midline shift. Vascular: No hyperdense vessel or  unexpected calcification. Skull: Normal. Negative for fracture or focal lesion. Sinuses/Orbits: The imaged paranasal sinuses are clear. The globes and orbits are unremarkable. Other: None. CT CERVICAL SPINE FINDINGS Alignment: There is reversal of the normal cervical lordosis. There is trace anterolisthesis of C5 on C6, unchanged. There is no other antero or retrolisthesis. There is no jumped or perched facet or other evidence of traumatic malalignment. Skull base and vertebrae: Skull base alignment is maintained. Vertebral body heights are preserved. There is no evidence of acute fracture. There is no suspicious osseous lesion. Soft tissues and spinal canal: No prevertebral fluid or swelling. No visible canal hematoma. Disc levels: There is disc space narrowing and degenerative endplate change most advanced at C6-C7. There is multilevel facet arthropathy there is no high-grade spinal canal stenosis. Upper chest: The imaged lung apices are clear. Other: None. IMPRESSION: 1. No acute intracranial pathology. 2. No acute fracture or traumatic malalignment of the cervical spine. Electronically Signed   By: Valetta Mole M.D.   On: 08/27/2022 12:12   CT CHEST ABDOMEN PELVIS WO CONTRAST  Result Date: 08/27/2022 CLINICAL DATA:  Unwitnessed fall, found down. EXAM: CT CHEST, ABDOMEN AND PELVIS WITHOUT CONTRAST TECHNIQUE: Multidetector CT imaging of the chest, abdomen and pelvis was performed following the standard protocol without IV contrast. RADIATION DOSE REDUCTION: This exam was performed according to the departmental dose-optimization program which includes automated exposure control, adjustment of the mA and/or kV according to patient size and/or use of iterative reconstruction technique. COMPARISON:  Same day chest radiograph FINDINGS: CT CHEST FINDINGS Cardiovascular: The heart size is normal. There is a trace pericardial  effusion dense coronary artery calcifications are seen primarily affecting the left anterior  descending artery. There is minimal calcified plaque in the thoracic aorta. Mediastinum/Nodes: The thyroid is unremarkable. The esophagus is grossly unremarkable. There is no mediastinal or axillary lymphadenopathy. There is no bulky hilar adenopathy, within the confines of noncontrast technique. Lungs/Pleura: The trachea and central airways are patent. There is no focal consolidation or pulmonary edema. There is no pleural effusion or pneumothorax. There are no suspicious nodules. Musculoskeletal: There is an acute nondisplaced fracture of the right anterior fifth rib (5-104). No other rib fracture is seen. There is no sternal fracture. There is no acute fracture or traumatic malalignment of the thoracic spine. There is multilevel degenerative change of the thoracic spine with flowing anterior osteophytes. CT ABDOMEN PELVIS FINDINGS Hepatobiliary: The liver and gallbladder are unremarkable. There is no biliary ductal dilatation. Pancreas: Unremarkable. Spleen: Unremarkable. Adrenals/Urinary Tract: The adrenals are unremarkable. The kidneys are unremarkable, with no focal lesion, stone, hydronephrosis, or hydroureter. The bladder is unremarkable. Stomach/Bowel: The stomach is unremarkable. There is no evidence of bowel obstruction. There is no abnormal bowel wall thickening or inflammatory change. There is colonic diverticulosis without evidence of acute diverticulitis. The appendix is normal. Vascular/Lymphatic: There is mild calcified plaque in the nonaneurysmal abdominal aorta. There is no abdominal or pelvic lymphadenopathy. Reproductive: The prostate and seminal vesicles are unremarkable. Other: There is no ascites or free air. Musculoskeletal: There is no acute fracture in the pelvis. There is no acute fracture or traumatic malalignment of the lumbar spine. There is multilevel degenerative change. IMPRESSION: 1. Acute nondisplaced fracture of the right anterior fifth rib. 2. No other evidence of acute  pathology in the chest, abdomen, or pelvis. 3. Trace pericardial effusion. 4. Dense coronary artery calcifications primarily affecting the left anterior descending artery. 5. Diverticulosis without evidence of acute diverticulitis. Electronically Signed   By: Valetta Mole M.D.   On: 08/27/2022 12:05   DG Chest Port 1 View  Result Date: 08/27/2022 CLINICAL DATA:  Two falls last night EXAM: PORTABLE CHEST 1 VIEW COMPARISON:  Chest radiograph 05/28/2021 FINDINGS: The cardiomediastinal silhouette is stable and within normal limits allowing for leftward patient rotation. There is no focal consolidation or pulmonary edema. There is no pleural effusion or pneumothorax. No displaced rib fracture or other acute osseous abnormality is identified. IMPRESSION: Stable chest with no radiographic evidence of acute cardiopulmonary process. Electronically Signed   By: Valetta Mole M.D.   On: 08/27/2022 10:29    Procedures Procedures    Medications Ordered in ED Medications  lactated ringers infusion ( Intravenous New Bag/Given 08/27/22 1430)  0.9 %  sodium chloride infusion ( Intravenous Not Given 08/27/22 1508)  insulin aspart (novoLOG) injection 0-15 Units (has no administration in time range)  insulin aspart (novoLOG) injection 0-5 Units (has no administration in time range)  nirmatrelvir/ritonavir EUA (renal dosing) (PAXLOVID) 2 tablet (has no administration in time range)  lactated ringers bolus 1,000 mL (0 mLs Intravenous Stopped 08/27/22 1337)  Tdap (BOOSTRIX) injection 0.5 mL (0.5 mLs Intramuscular Given 08/27/22 1042)    ED Course/ Medical Decision Making/ A&P                           Medical Decision Making Amount and/or Complexity of Data Reviewed Labs: ordered. Radiology: ordered. ECG/medicine tests: ordered.  Risk Prescription drug management. Decision regarding hospitalization.   This patient presents to the ED for concern of neurolyse weakness, this involves an  extensive number of  treatment options, and is a complaint that carries with it a high risk of complications and morbidity.  The differential diagnosis includes infection, dehydration, rhabdomyolysis, polypharmacy, CVA, ICH, anemia, metabolic derangements   Co morbidities that complicate the patient evaluation  HTN, HLD, CAD, arthritis, anxiety, prior skull fracture, prior right total knee replacement   Additional history obtained:  Additional history obtained from EMS, patient's family External records from outside source obtained and reviewed including EMR   Lab Tests:  I Ordered, and personally interpreted labs.  The pertinent results include: Patient is COVID-19 positive.  Lab work shows myoglobinuria.  CK is elevated.  Creatinine is currently baseline.  Electrolytes are normal.  Hemoglobin is also normal and he has no leukocytosis but does have a mild left shift.  D-dimer is moderately elevated.  Initial lactate was elevated at 2.4.  There was improvement following IV fluids.  Imaging Studies ordered:  I ordered imaging studies including chest x-ray, CT of chest, abdomen, pelvis, C-spine, head I independently visualized and interpreted imaging which showed nondisplaced fracture of right anterior fifth rib without other acute findings. I agree with the radiologist interpretation   Cardiac Monitoring: / EKG:  The patient was maintained on a cardiac monitor.  I personally viewed and interpreted the cardiac monitored which showed an underlying rhythm of: Sinus rhythm   Problem List / ED Course / Critical interventions / Medication management  Patient presents to the ED after being found on the floor at his home.  He lives independently and typically does not have difficulty walking.  He does describe difficulty with ambulation over the past 3 days.  This led to 2 falls yesterday.  His second fall occurred at 9:30 PM and he was too weak to get up off of the floor.  He was positioned over a heating vent  and this caused a mild burn to left side of the abdomen.  Although this suggest that he was too weak to even roll or slide on the floor, EMS does report that he was able to ambulate with assistance to the ambulance.  Vital signs prior to arrival were normal.  Blood sugar was mildly elevated.  On initial assessment, he does have bilateral periorbital edema which is likely from laying face down throughout the night.  Area of burn appears to be mostly first-degree.  There is a small area of blistering, indicative of second-degree.  He does have global weakness.  I do not identify any focal deficits.  Although he is slow to respond, he is alert and oriented.  Broad diagnostic workup was initiated.  Patient did improve following IV fluids.  Notable lab findings are detailed above.  Imaging is notable for 1 nondisplaced rib fracture.  Patient's D-dimer is moderately elevated.  This is expected in setting of COVID-19 illness.  Given his generalized weakness, PE remains on differential.  Fortunately, patient's vital signs remained normal while in the ED.  Given his myoglobinuria and CKD, I am concerned of possible worsening of kidney function.  Contrasted studies to be deferred at this time.  Patient was admitted to hospitalist for further management. I ordered medication including IV fluids for dehydration; Tdap for tetanus prophylaxis Reevaluation of the patient after these medicines showed that the patient improved I have reviewed the patients home medicines and have made adjustments as needed   Social Determinants of Health:  Lives independently         Final Clinical Impression(s) / ED Diagnoses Final diagnoses:  Fall, initial encounter  Generalized weakness  COVID-19  Elevated CK    Rx / DC Orders ED Discharge Orders     None         Godfrey Pick, MD 08/27/22 1558

## 2022-08-27 NOTE — ED Notes (Signed)
Pt returned from nuc med

## 2022-08-27 NOTE — ED Notes (Signed)
Pt transported to nuc med  

## 2022-08-27 NOTE — ED Notes (Signed)
Pt returned from MRI °

## 2022-08-27 NOTE — TOC Progression Note (Signed)
  Transition of Care [facility]) Screening Note   Patient Details  Name: Stephen Graves Date of Birth: 1956/09/02   Transition of Care [facility]) CM/SW Contact:    Boneta Lucks, RN Phone Number: 08/27/2022, 3:52 PM  Admitting in OBS, COVID +, TOC following or discharge plan.  Transition of Care Department [facility]) has reviewed patient and no TOC needs have been identified at this time. We will continue to monitor patient advancement through interdisciplinary progression rounds. If new patient transition needs arise, please place a TOC consult.      Barriers to Discharge: Continued Medical Work up

## 2022-08-27 NOTE — ED Triage Notes (Signed)
Pt arrives [facility] for 2 falls last night, last fall was at 9:30. Brother found him at 8:30 this am. Denies LOC, states he laid on stomach in floor all night. Pt landed on heat vent and was unable to move, redness noted to abdomen. C/o rib pain. CBG 175.

## 2022-08-27 NOTE — ED Notes (Signed)
Repositioned in bed. Gave dinner tray

## 2022-08-28 ENCOUNTER — Observation Stay (HOSPITAL_BASED_OUTPATIENT_CLINIC_OR_DEPARTMENT_OTHER): Payer: Medicare HMO

## 2022-08-28 DIAGNOSIS — N179 Acute kidney failure, unspecified: Secondary | ICD-10-CM | POA: Diagnosis not present

## 2022-08-28 DIAGNOSIS — I10 Essential (primary) hypertension: Secondary | ICD-10-CM | POA: Diagnosis not present

## 2022-08-28 DIAGNOSIS — T796XXA Traumatic ischemia of muscle, initial encounter: Secondary | ICD-10-CM | POA: Diagnosis not present

## 2022-08-28 DIAGNOSIS — E7800 Pure hypercholesterolemia, unspecified: Secondary | ICD-10-CM | POA: Diagnosis not present

## 2022-08-28 DIAGNOSIS — R55 Syncope and collapse: Secondary | ICD-10-CM | POA: Diagnosis not present

## 2022-08-28 DIAGNOSIS — X19XXXA Contact with other heat and hot substances, initial encounter: Secondary | ICD-10-CM | POA: Diagnosis not present

## 2022-08-28 DIAGNOSIS — U071 COVID-19: Secondary | ICD-10-CM | POA: Diagnosis not present

## 2022-08-28 DIAGNOSIS — Z87891 Personal history of nicotine dependence: Secondary | ICD-10-CM | POA: Diagnosis not present

## 2022-08-28 DIAGNOSIS — I25110 Atherosclerotic heart disease of native coronary artery with unstable angina pectoris: Secondary | ICD-10-CM | POA: Diagnosis not present

## 2022-08-28 DIAGNOSIS — G8929 Other chronic pain: Secondary | ICD-10-CM | POA: Diagnosis not present

## 2022-08-28 DIAGNOSIS — S2231XA Fracture of one rib, right side, initial encounter for closed fracture: Secondary | ICD-10-CM | POA: Diagnosis not present

## 2022-08-28 DIAGNOSIS — W19XXXA Unspecified fall, initial encounter: Secondary | ICD-10-CM | POA: Diagnosis not present

## 2022-08-28 DIAGNOSIS — R296 Repeated falls: Secondary | ICD-10-CM | POA: Diagnosis not present

## 2022-08-28 DIAGNOSIS — D696 Thrombocytopenia, unspecified: Secondary | ICD-10-CM | POA: Diagnosis not present

## 2022-08-28 DIAGNOSIS — Y92009 Unspecified place in unspecified non-institutional (private) residence as the place of occurrence of the external cause: Secondary | ICD-10-CM | POA: Diagnosis not present

## 2022-08-28 DIAGNOSIS — Z602 Problems related to living alone: Secondary | ICD-10-CM | POA: Diagnosis not present

## 2022-08-28 DIAGNOSIS — R531 Weakness: Secondary | ICD-10-CM | POA: Diagnosis not present

## 2022-08-28 DIAGNOSIS — Z955 Presence of coronary angioplasty implant and graft: Secondary | ICD-10-CM | POA: Diagnosis not present

## 2022-08-28 DIAGNOSIS — E860 Dehydration: Secondary | ICD-10-CM | POA: Diagnosis not present

## 2022-08-28 DIAGNOSIS — T2122XA Burn of second degree of abdominal wall, initial encounter: Secondary | ICD-10-CM | POA: Diagnosis present

## 2022-08-28 DIAGNOSIS — R748 Abnormal levels of other serum enzymes: Secondary | ICD-10-CM | POA: Diagnosis not present

## 2022-08-28 DIAGNOSIS — Z7984 Long term (current) use of oral hypoglycemic drugs: Secondary | ICD-10-CM | POA: Diagnosis not present

## 2022-08-28 DIAGNOSIS — E119 Type 2 diabetes mellitus without complications: Secondary | ICD-10-CM | POA: Diagnosis not present

## 2022-08-28 DIAGNOSIS — Z79899 Other long term (current) drug therapy: Secondary | ICD-10-CM | POA: Diagnosis not present

## 2022-08-28 DIAGNOSIS — Z23 Encounter for immunization: Secondary | ICD-10-CM | POA: Diagnosis not present

## 2022-08-28 DIAGNOSIS — W19XXXD Unspecified fall, subsequent encounter: Secondary | ICD-10-CM | POA: Diagnosis not present

## 2022-08-28 DIAGNOSIS — Z8249 Family history of ischemic heart disease and other diseases of the circulatory system: Secondary | ICD-10-CM | POA: Diagnosis not present

## 2022-08-28 DIAGNOSIS — Z888 Allergy status to other drugs, medicaments and biological substances status: Secondary | ICD-10-CM | POA: Diagnosis not present

## 2022-08-28 DIAGNOSIS — D649 Anemia, unspecified: Secondary | ICD-10-CM | POA: Diagnosis not present

## 2022-08-28 DIAGNOSIS — F419 Anxiety disorder, unspecified: Secondary | ICD-10-CM | POA: Diagnosis not present

## 2022-08-28 DIAGNOSIS — Z7902 Long term (current) use of antithrombotics/antiplatelets: Secondary | ICD-10-CM | POA: Diagnosis not present

## 2022-08-28 LAB — COMPREHENSIVE METABOLIC PANEL
ALT: 18 U/L (ref 0–44)
AST: 44 U/L — ABNORMAL HIGH (ref 15–41)
Albumin: 3 g/dL — ABNORMAL LOW (ref 3.5–5.0)
Alkaline Phosphatase: 49 U/L (ref 38–126)
Anion gap: 8 (ref 5–15)
BUN: 32 mg/dL — ABNORMAL HIGH (ref 8–23)
CO2: 22 mmol/L (ref 22–32)
Calcium: 8.4 mg/dL — ABNORMAL LOW (ref 8.9–10.3)
Chloride: 107 mmol/L (ref 98–111)
Creatinine, Ser: 1.08 mg/dL (ref 0.61–1.24)
GFR, Estimated: >60 mL/min (ref >60)
Glucose, Bld: 101 mg/dL — ABNORMAL HIGH (ref 70–99)
Potassium: 3.7 mmol/L (ref 3.5–5.1)
Sodium: 137 mmol/L (ref 135–145)
Total Bilirubin: 1.2 mg/dL (ref 0.3–1.2)
Total Protein: 5.7 g/dL — ABNORMAL LOW (ref 6.5–8.1)

## 2022-08-28 LAB — URINE CULTURE: Culture: NO GROWTH

## 2022-08-28 LAB — ECHOCARDIOGRAM COMPLETE
AR max vel: 2.66 cm2
AV Area VTI: 2.52 cm2
AV Area mean vel: 2.67 cm2
AV Mean grad: 3 mmHg
AV Peak grad: 5.3 mmHg
Ao pk vel: 1.16 m/s
Area-P 1/2: 3.37 cm2
Height: 71 in
MV VTI: 2.22 cm2
S' Lateral: 3.9 cm
Weight: 3502.67 oz

## 2022-08-28 LAB — CBC WITH DIFFERENTIAL/PLATELET
Abs Immature Granulocytes: 0.02 10*3/uL (ref 0.00–0.07)
Basophils Absolute: 0 10*3/uL (ref 0.0–0.1)
Basophils Relative: 0 %
Eosinophils Absolute: 0 10*3/uL (ref 0.0–0.5)
Eosinophils Relative: 0 %
HCT: 34.1 % — ABNORMAL LOW (ref 39.0–52.0)
Hemoglobin: 10.8 g/dL — ABNORMAL LOW (ref 13.0–17.0)
Immature Granulocytes: 0 %
Lymphocytes Relative: 14 %
Lymphs Abs: 0.9 10*3/uL (ref 0.7–4.0)
MCH: 28.3 pg (ref 26.0–34.0)
MCHC: 31.7 g/dL (ref 30.0–36.0)
MCV: 89.3 fL (ref 80.0–100.0)
Monocytes Absolute: 0.7 10*3/uL (ref 0.1–1.0)
Monocytes Relative: 11 %
Neutro Abs: 4.6 10*3/uL (ref 1.7–7.7)
Neutrophils Relative %: 75 %
Platelets: 110 10*3/uL — ABNORMAL LOW (ref 150–400)
RBC: 3.82 MIL/uL — ABNORMAL LOW (ref 4.22–5.81)
RDW: 15.2 % (ref 11.5–15.5)
WBC: 6.2 10*3/uL (ref 4.0–10.5)
nRBC: 0 % (ref 0.0–0.2)

## 2022-08-28 LAB — FERRITIN: Ferritin: 16 ng/mL — ABNORMAL LOW (ref 24–336)

## 2022-08-28 LAB — GLUCOSE, CAPILLARY
Glucose-Capillary: 153 mg/dL — ABNORMAL HIGH (ref 70–99)
Glucose-Capillary: 101 mg/dL — ABNORMAL HIGH (ref 70–99)
Glucose-Capillary: 131 mg/dL — ABNORMAL HIGH (ref 70–99)
Glucose-Capillary: 100 mg/dL — ABNORMAL HIGH (ref 70–99)
Glucose-Capillary: 103 mg/dL — ABNORMAL HIGH (ref 70–99)

## 2022-08-28 LAB — C-REACTIVE PROTEIN: CRP: 8.5 mg/dL — ABNORMAL HIGH (ref <1.0)

## 2022-08-28 LAB — MAGNESIUM: Magnesium: 2.1 mg/dL (ref 1.7–2.4)

## 2022-08-28 LAB — PHOSPHORUS: Phosphorus: 3.3 mg/dL (ref 2.5–4.6)

## 2022-08-28 MED ORDER — PERFLUTREN LIPID MICROSPHERE
1.0000 mL | INTRAVENOUS | Status: AC | PRN
  Administered 2022-08-28: 5 mL via INTRAVENOUS

## 2022-08-28 MED ORDER — IPRATROPIUM-ALBUTEROL 20-100 MCG/ACT IN AERS
1.0000 | INHALATION_SPRAY | Freq: Three times a day (TID) | RESPIRATORY_TRACT | Status: DC
  Administered 2022-08-29: 1 via RESPIRATORY_TRACT

## 2022-08-28 NOTE — Progress Notes (Signed)
Patient has arrived to the floor alert but confused. Patient rested well, vitals stable and no oxygen needed.  Patient voiced no concerns through night.

## 2022-08-28 NOTE — Progress Notes (Signed)
PROGRESS NOTE     Stephen Graves, is a 66 y.o. male, DOB - [birth year], DGU:[id number]  Admit date - 08/27/2022   Admitting Physician Atalya Dano Denton Brick, MD  Outpatient Primary MD for the patient is [facility]  LOS - 0  Chief Complaint  Patient presents with   Fall        Brief Narrative:   66 y.o. male  with PMH of CAD s/p DES to mid-distal LAD in 06/2017, chronic chest pain, hypertension, hyperlipidemia, anxiety admitted from home on 08/27/2022 after recurrent falls with right-sided rib fractures and elevated CKs in the setting of COVID-19 infection    -Assessment and Plan: 1)S/p Fall with right anterior fifth rib fracture and elevated CPK/dehydration CK 2,783 >>> 2,743 -Lactic acid 2.5 repeat 2.1 -Patient denies chest pains or syncope -Troponin 48 >> 41 -EKG is not acute -D-dimer is elevated however VQ scan is unremarkable --T. bili 4.5 AST of 51 otherwise LFTs WNL -Chest x-ray without acute findings -CT head and brain MRI without acute findings CT C-spine without acute findings CT abdomen pelvis without acute findings -CT chest with acute nondisplaced fracture of the right anterior fifth rib -iv Fluids as ordered -Hold Crestor due to elevated CPKs -Physical therapist recommends SNF rehab   2) H/o chronic anemia---- hemoglobin 13.2 which is higher than baseline suspect some degree of hemoconcentration- --anticipate that hemoglobin will drop with hydration/hemodilution -on 08/28/2022--Hgb is now back to baseline range currently 10.8 after hydration   3) mild thrombocytopenia --not new monitor closely,  no bleeding concerns at this time   4)AKI--in retrospect the patient does not have CKD most likely has just AKI -Admission creatinine 1.46 -With hydration creatinine is down to 1.0 with GFR greater than 60 --renally adjust medications, avoid nephrotoxic agents / dehydration  / hypotension   5)COVID-19 infection --no significant hypoxia -Treat  empirically with Paxlovid -No Covid pneumonia on chest imaging studies hold off on steroids --Check and trend inflammatory markers including D-dimer, ferritin and  CRP---also follow CBC and CMP --Supplemental oxygen to keep O2 sats above 93% ---- Encourage prone positioning  if able to tolerate --Attempt to maintain euvolemic state --Zinc and vitamin C as ordered -Albuterol inhaler as needed -Accu-Cheks/fingersticks while on high-dose steroids   6)Hx of CAD s/p stent to LAD: Patient had LHC in September 2023  CTO treated with successful PCI/DES overlapping previously placed stent. Planned for DAPT with ASA/Brilinta for at least one year, likely lifelong given extensive stenting in the LAD. No recurrent chest pain. Seen by CR.  -- continue on ASA, plavix, Coreg 6.'25mg'$  BID,  -Prior echo showed  EF 60-65% with no rWMA noted, severe biatrial enlargement -Repeat echo from 08/28/2022 with EF of 60 to 65%, no regional wall motion abnormalities, no aortic stenosis no mitral stenosis -Hold Crestor due to elevated CPKs -Hold losartan due to concerns about risk of AKI on CKD given dehydration   7)DM2- Hgb A1c 6.4 reflecting fair diabetic control PTA -Hold metformin Use Novolog/Humalog Sliding scale insulin with Accu-Cheks/Fingersticks as ordered    8)Anxiety: c/n  Celexa  9)Disposition/Recurrent falls---PTA pt lived alone and did very poorly, patient has significant limitations with mobility related ADLs- this patient needs to continue to be monitored in the hospital until a SNF bed is obtained as she is not safe to go home with her current physcical limitations -Physical therapy evaluation appreciated recommends SNF rehab   Dispo: The patient is from: Home  Anticipated d/c is to: SNF              Anticipated d/c date is: 1 day              Patient currently is not medically stable to d/c. Barriers: Not Clinically Stable-   Code Status : -  Code Status: Full Code   Family  Communication:   NA (patient is alert, awake and coherent)  DVT Prophylaxis  :   - SCDs   heparin injection 5,000 Units Start: 08/27/22 2200 SCDs Start: 08/27/22 1607 Place TED hose Start: 08/27/22 1607   Lab Results  Component Value Date   PLT 110 (L) 08/28/2022    Inpatient Medications  Scheduled Meds:  vitamin C  500 mg Oral Daily   aspirin EC  81 mg Oral Daily   carvedilol  3.125 mg Oral BID   citalopram  20 mg Oral Daily   clopidogrel  75 mg Oral Daily   heparin  5,000 Units Subcutaneous Q8H   insulin aspart  0-15 Units Subcutaneous TID WC   insulin aspart  0-5 Units Subcutaneous QHS   Ipratropium-Albuterol  1 puff Inhalation Q6H   multivitamin with minerals  1 tablet Oral Daily   nirmatrelvir/ritonavir EUA (renal dosing)  2 tablet Oral BID   potassium chloride SA  20 mEq Oral Daily   sodium chloride flush  3 mL Intravenous Q12H   sodium chloride flush  3 mL Intravenous Q12H   zinc sulfate  220 mg Oral Daily   Continuous Infusions:  sodium chloride 125 mL/hr at 08/28/22 1249   sodium chloride     lactated ringers Stopped (08/27/22 2119)   PRN Meds:.sodium chloride, acetaminophen **OR** acetaminophen, bisacodyl, chlorpheniramine-HYDROcodone, guaiFENesin-dextromethorphan, hydrALAZINE, ondansetron **OR** ondansetron (ZOFRAN) IV, oxyCODONE, polyethylene glycol, sodium chloride flush, traZODone   Anti-infectives (From admission, onward)    Start     Dose/Rate Route Frequency Ordered Stop   08/27/22 1500  nirmatrelvir/ritonavir EUA (renal dosing) (PAXLOVID) 2 tablet        2 tablet Oral 2 times daily 08/27/22 1419 09/01/22 0959         Subjective: Stephen Graves today has no fevers, no emesis,  No chest pain,   - Fatigue and generalized weakness persist -Denies significant dizziness   Objective: Vitals:   08/28/22 0855 08/28/22 1041 08/28/22 1409 08/28/22 1439  BP:  (!) 158/98 (!) 150/96   Pulse:  80 78   Resp:  18 17   Temp:  98.9 F (37.2 C) 98.3 F  (36.8 C)   TempSrc:  Oral    SpO2: 97% 98% 98% 100%  Weight:      Height:        Intake/Output Summary (Last 24 hours) at 08/28/2022 1920 Last data filed at 08/28/2022 1759 Gross per 24 hour  Intake 2177.5 ml  Output 200 ml  Net 1977.5 ml   Filed Weights   08/27/22 0919 08/28/22 0023  Weight: 97.1 kg 99.3 kg    Physical Exam  Physical Examination: General appearance - alert,  in no distress  Mental status - alert, oriented to person, place, and time,  Eyes - sclera anicteric Neck - supple, no JVD elevation , Chest - clear  to auscultation bilaterally, symmetrical air movement,  Heart - S1 and S2 normal, regular  Abdomen - soft, nontender, nondistended, +BS, anterior abdominal wall erythema without significant burn injury or deep tissue involvement  neurological - screening mental status exam normal, neck supple without rigidity, cranial nerves II  through XII intact, DTR's normal and symmetric Extremities - no pedal edema noted, intact peripheral pulses  Skin - warm, dry  Data Reviewed: I have personally reviewed following labs and imaging studies  CBC: Recent Labs  Lab 08/27/22 0915 08/28/22 0535  WBC 9.7 6.2  NEUTROABS 8.6* 4.6  HGB 13.2 10.8*  HCT 41.7 34.1*  MCV 88.7 89.3  PLT 144* 643*   Basic Metabolic Panel: Recent Labs  Lab 08/27/22 0915 08/28/22 0535  NA 136 137  K 4.0 3.7  CL 103 107  CO2 22 22  GLUCOSE 174* 101*  BUN 35* 32*  CREATININE 1.46* 1.08  CALCIUM 9.1 8.4*  MG 2.1 2.1  PHOS  --  3.3   GFR: Estimated Creatinine Clearance: 78.6 mL/min (by C-G formula based on SCr of 1.08 mg/dL). Liver Function Tests: Recent Labs  Lab 08/27/22 0915 08/28/22 0535  AST 51* 44*  ALT 21 18  ALKPHOS 68 49  BILITOT 1.5* 1.2  PROT 6.9 5.7*  ALBUMIN 3.8 3.0*   Cardiac Enzymes: Recent Labs  Lab 08/27/22 0915 08/27/22 1156  CKTOTAL 2,783* 2,743*   Recent Results (from the past 240 hour(s))  Urine Culture     Status: None   Collection Time:  08/27/22  9:32 AM   Specimen: Urine, Catheterized  Result Value Ref Range Status   Specimen Description   Final    URINE, CATHETERIZED Performed at [facility], [address], [place], [place] [address]    Special Requests   Final    NONE Performed at [facility], [address] [place], [place] [address]    Culture   Final    NO GROWTH Performed at [facility], [place] [address], [place], [place] [address]    Report Status 08/28/2022 FINAL  Final  Resp Panel by RT-PCR (Flu A&B, Covid) Anterior Nasal Swab     Status: Abnormal   Collection Time: 08/27/22  9:33 AM   Specimen: Anterior Nasal Swab  Result Value Ref Range Status   SARS Coronavirus 2 by RT PCR POSITIVE (A) NEGATIVE Final    Comment: (NOTE) SARS-CoV-2 target nucleic acids are DETECTED.  The SARS-CoV-2 RNA is generally detectable in upper respiratory specimens during the acute phase of infection. Positive results are indicative of the presence of the identified virus, but do not rule out bacterial infection or co-infection with other pathogens not detected by the test. Clinical correlation with patient history and other diagnostic information is necessary to determine patient infection status. The expected result is Negative.  Fact Sheet for Patients: EntrepreneurPulse.com.au  Fact Sheet for Healthcare Providers: IncredibleEmployment.be  This test is not yet approved or cleared by the [place] FDA and  has been authorized for detection and/or diagnosis of SARS-CoV-2 by FDA under an Emergency Use Authorization (EUA).  This EUA will remain in effect (meaning this test can be used) for the duration of  the COVID-19 declaration under Section 564(b)(1) of the A ct, 21 U.S.C. section 360bbb-3(b)(1), unless the authorization is terminated or revoked sooner.     Influenza A by PCR NEGATIVE NEGATIVE Final   Influenza B by PCR NEGATIVE NEGATIVE Final    Comment: (NOTE) The  Xpert Xpress SARS-CoV-2/FLU/RSV plus assay is intended as an aid in the diagnosis of influenza from Nasopharyngeal swab specimens and should not be used as a sole basis for treatment. Nasal washings and aspirates are unacceptable for Xpert Xpress SARS-CoV-2/FLU/RSV testing.  Fact Sheet for Patients: EntrepreneurPulse.com.au  Fact Sheet for Healthcare Providers:  IncredibleEmployment.be  This test is not yet approved or cleared by the [place] and has been authorized for detection and/or diagnosis of SARS-CoV-2 by FDA under an Emergency Use Authorization (EUA). This EUA will remain in effect (meaning this test can be used) for the duration of the COVID-19 declaration under Section 564(b)(1) of the Act, 21 U.S.C. section 360bbb-3(b)(1), unless the authorization is terminated or revoked.  Performed at [facility], [address] [place], [place] [address]   Blood Culture (routine x 2)     Status: None (Preliminary result)   Collection Time: 08/27/22 10:18 AM   Specimen: Right Antecubital; Blood  Result Value Ref Range Status   Specimen Description RIGHT ANTECUBITAL  Final   Special Requests   Final    BOTTLES DRAWN AEROBIC AND ANAEROBIC Blood Culture results may not be optimal due to an excessive volume of blood received in culture bottles   Culture   Final    NO GROWTH < 24 HOURS Performed at [facility], [address], [place], [place] [address]    Report Status PENDING  Incomplete  Blood Culture (routine x 2)     Status: None (Preliminary result)   Collection Time: 08/27/22 10:18 AM   Specimen: Left Antecubital; Blood  Result Value Ref Range Status   Specimen Description LEFT ANTECUBITAL  Final   Special Requests   Final    BOTTLES DRAWN AEROBIC AND ANAEROBIC Blood Culture results may not be optimal due to an excessive volume of blood received in culture bottles   Culture   Final    NO GROWTH < 24 HOURS Performed at [facility], [address] [place], [place] [address]    Report Status PENDING  Incomplete      Radiology Studies: ECHOCARDIOGRAM COMPLETE  Result Date: 08/28/2022    ECHOCARDIOGRAM REPORT   Patient Name:   Stephen Graves Date of Exam: 08/28/2022 Medical Rec #:  [id number]           Height:       71.0 in Accession #:    [id number]          Weight:       218.9 lb Date of Birth:  1956-08-12           BSA:          2.191 m Patient Age:    59 years            BP:           136/88 mmHg Patient Gender: M                   HR:           96 bpm. Exam Location:  [facility] Procedure: 2D Echo, Cardiac Doppler, Color Doppler and Intracardiac            Opacification Agent Indications:    Syncope  History:        Patient has prior history of Echocardiogram examinations, most                 recent 05/28/2021. CAD, Prior Cardiac Surgery,                 Signs/Symptoms:Chest Pain; Risk Factors:Hypertension and                 Dyslipidemia. COVID +.  Sonographer:    Wenda Low Referring Phys: [place]  1. Left ventricular ejection fraction, by estimation,  is 60 to 65%. The left ventricle has normal function. The left ventricle has no regional wall motion abnormalities. There is severe left ventricular hypertrophy. Left ventricular diastolic parameters  are indeterminate.  2. Right ventricular systolic function is normal. The right ventricular size is normal. Tricuspid regurgitation signal is inadequate for assessing PA pressure.  3. A small pericardial effusion is present. The pericardial effusion is circumferential.  4. The mitral valve is normal in structure. No evidence of mitral valve regurgitation. No evidence of mitral stenosis.  5. The aortic valve is tricuspid. Aortic valve regurgitation is not visualized. No aortic stenosis is present.  6. Aortic dilatation noted. There is mild dilatation of the aortic root, measuring 40 mm. There is mild dilatation of the ascending aorta, measuring 41 mm.  FINDINGS  Left Ventricle: Left ventricular ejection fraction, by estimation, is 60 to 65%. The left ventricle has normal function. The left ventricle has no regional wall motion abnormalities. Definity contrast agent was given IV to delineate the left ventricular  endocardial borders. The left ventricular internal cavity size was normal in size. There is severe left ventricular hypertrophy. Left ventricular diastolic parameters are indeterminate. Right Ventricle: The right ventricular size is normal. Right vetricular wall thickness was not well visualized. Right ventricular systolic function is normal. Tricuspid regurgitation signal is inadequate for assessing PA pressure. Left Atrium: Left atrial size was normal in size. Right Atrium: Right atrial size was not well visualized. Pericardium: A small pericardial effusion is present. The pericardial effusion is circumferential. Mitral Valve: The mitral valve is normal in structure. No evidence of mitral valve regurgitation. No evidence of mitral valve stenosis. MV peak gradient, 4.4 mmHg. The mean mitral valve gradient is 2.0 mmHg. Tricuspid Valve: The tricuspid valve is not well visualized. Tricuspid valve regurgitation is trivial. No evidence of tricuspid stenosis. Aortic Valve: The aortic valve is tricuspid. Aortic valve regurgitation is not visualized. No aortic stenosis is present. Aortic valve mean gradient measures 3.0 mmHg. Aortic valve peak gradient measures 5.3 mmHg. Aortic valve area, by VTI measures 2.52 cm. Pulmonic Valve: The pulmonic valve was not well visualized. Pulmonic valve regurgitation is trivial. No evidence of pulmonic stenosis. Aorta: Aortic dilatation noted. There is mild dilatation of the aortic root, measuring 40 mm. There is mild dilatation of the ascending aorta, measuring 41 mm. IAS/Shunts: No atrial level shunt detected by color flow Doppler.  LEFT VENTRICLE PLAX 2D LVIDd:         6.00 cm LVIDs:         3.90 cm LV PW:         1.40 cm LV  IVS:        1.60 cm LVOT diam:     2.00 cm LV SV:         52 LV SV Index:   24 LVOT Area:     3.14 cm  RIGHT VENTRICLE RV Basal diam:  4.05 cm RV Mid diam:    3.60 cm RV S prime:     13.70 cm/s TAPSE (M-mode): 3.0 cm LEFT ATRIUM           Index        RIGHT ATRIUM           Index LA diam:      4.50 cm 2.05 cm/m   RA Area:     25.30 cm LA Vol (A2C): 83.2 ml 37.97 ml/m  RA Volume:   78.70 ml  35.92 ml/m LA Vol (A4C): 56.8 ml 25.92 ml/m  AORTIC VALVE                    PULMONIC VALVE AV Area (Vmax):    2.66 cm     PV Vmax:       0.93 m/s AV Area (Vmean):   2.67 cm     PV Peak grad:  3.5 mmHg AV Area (VTI):     2.52 cm AV Vmax:           115.50 cm/s AV Vmean:          72.700 cm/s AV VTI:            0.205 m AV Peak Grad:      5.3 mmHg AV Mean Grad:      3.0 mmHg LVOT Vmax:         97.90 cm/s LVOT Vmean:        61.800 cm/s LVOT VTI:          0.164 m LVOT/AV VTI ratio: 0.80  AORTA Ao Root diam: 4.00 cm Ao Asc diam:  4.10 cm MITRAL VALVE MV Area (PHT): 3.37 cm     SHUNTS MV Area VTI:   2.22 cm     Systemic VTI:  0.16 m MV Peak grad:  4.4 mmHg     Systemic Diam: 2.00 cm MV Mean grad:  2.0 mmHg MV Vmax:       1.05 m/s MV Vmean:      56.8 cm/s MV Decel Time: 225 msec MV E velocity: 111.00 cm/s Carlyle Dolly MD Electronically signed by Carlyle Dolly MD Signature Date/Time: 08/28/2022/11:16:57 AM    Final    NM Pulmonary Perfusion  Result Date: 08/27/2022 CLINICAL DATA:  Elevated D-dimer level, chest pain for 1 day EXAM: NUCLEAR MEDICINE PERFUSION LUNG SCAN TECHNIQUE: Perfusion images were obtained in multiple projections after intravenous injection of radiopharmaceutical. Ventilation scans intentionally deferred if perfusion scan and chest x-ray adequate for interpretation during COVID 19 epidemic. RADIOPHARMACEUTICALS:  4.4 mCi Tc-40mMAA IV COMPARISON:  Chest CT 08/27/2022 and chest radiograph 08/27/2022 FINDINGS: No perfusion defect is identified in the lungs to suggest pulmonary embolus. Overall  distribution of radiopharmaceutical is normal. IMPRESSION: 1. Normal perfusion lung scan, without findings to suggest acute pulmonary embolus. Electronically Signed   By: WVan ClinesM.D.   On: 08/27/2022 18:22   MR BRAIN WO CONTRAST  Result Date: 08/27/2022 CLINICAL DATA:  Altered mental status. EXAM: MRI HEAD WITHOUT CONTRAST TECHNIQUE: Multiplanar, multiecho pulse sequences of the brain and surrounding structures were obtained without intravenous contrast. COMPARISON:  None Available. FINDINGS: Brain: No acute infarction, hemorrhage, hydrocephalus, extra-axial collection or mass lesion. Encephalomalacia and volume loss in the anterior left temporal lobe (series 14, image 8), likely related to prior infarct. There is sequela of mild chronic microvascular ischemic change. Vascular: Normal flow voids. Skull and upper cervical spine: Normal marrow signal. Sinuses/Orbits: Mild mucosal thickening in the bilateral maxillary sinuses. Asymmetric right-sided mastoid effusion Other: None. IMPRESSION: 1. No acute intracranial process. 2. 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 Electronically Signed   By: HMarin RobertsM.D.   On: 08/27/2022 16:44   CT HEAD WO CONTRAST  Result Date: 08/27/2022 CLINICAL DATA:  Unwitnessed fall, found down EXAM: CT HEAD WITHOUT CONTRAST CT CERVICAL SPINE WITHOUT CONTRAST TECHNIQUE: Multidetector CT imaging of the head and cervical spine was performed following the standard protocol without intravenous contrast. Multiplanar CT image reconstructions of the cervical spine were also generated.  RADIATION DOSE REDUCTION: This exam was performed according to the departmental dose-optimization program which includes automated exposure control, adjustment of the mA and/or kV according to patient size and/or use of iterative reconstruction technique. COMPARISON:  CT head and cervical spine 09/08/2021 FINDINGS: CT HEAD FINDINGS Brain: There  is no acute intracranial hemorrhage, extra-axial fluid collection, or acute infarct. Parenchymal volume is normal for age. The ventricles are stable in size. 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 Gray-white differentiation is otherwise preserved. There is no mass lesion.  There is no mass effect or midline shift. Vascular: No hyperdense vessel or unexpected calcification. Skull: Normal. Negative for fracture or focal lesion. Sinuses/Orbits: The imaged paranasal sinuses are clear. The globes and orbits are unremarkable. Other: None. CT CERVICAL SPINE FINDINGS Alignment: There is reversal of the normal cervical lordosis. There is trace anterolisthesis of C5 on C6, unchanged. There is no other antero or retrolisthesis. There is no jumped or perched facet or other evidence of traumatic malalignment. Skull base and vertebrae: Skull base alignment is maintained. Vertebral body heights are preserved. There is no evidence of acute fracture. There is no suspicious osseous lesion. Soft tissues and spinal canal: No prevertebral fluid or swelling. No visible canal hematoma. Disc levels: There is disc space narrowing and degenerative endplate change most advanced at C6-C7. There is multilevel facet arthropathy there is no high-grade spinal canal stenosis. Upper chest: The imaged lung apices are clear. Other: None. IMPRESSION: 1. No acute intracranial pathology. 2. No acute fracture or traumatic malalignment of the cervical spine. Electronically Signed   By: Valetta Mole M.D.   On: 08/27/2022 12:12   CT CERVICAL SPINE WO CONTRAST  Result Date: 08/27/2022 CLINICAL DATA:  Unwitnessed fall, found down EXAM: CT HEAD WITHOUT CONTRAST CT CERVICAL SPINE WITHOUT CONTRAST TECHNIQUE: Multidetector CT imaging of the head and cervical spine was performed following the standard protocol without intravenous contrast. Multiplanar CT image reconstructions of the cervical spine were also generated. RADIATION DOSE REDUCTION:  This exam was performed according to the departmental dose-optimization program which includes automated exposure control, adjustment of the mA and/or kV according to patient size and/or use of iterative reconstruction technique. COMPARISON:  CT head and cervical spine 09/08/2021 FINDINGS: CT HEAD FINDINGS Brain: There is no acute intracranial hemorrhage, extra-axial fluid collection, or acute infarct. Parenchymal volume is normal for age. The ventricles are stable in size. 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 Gray-white differentiation is otherwise preserved. There is no mass lesion.  There is no mass effect or midline shift. Vascular: No hyperdense vessel or unexpected calcification. Skull: Normal. Negative for fracture or focal lesion. Sinuses/Orbits: The imaged paranasal sinuses are clear. The globes and orbits are unremarkable. Other: None. CT CERVICAL SPINE FINDINGS Alignment: There is reversal of the normal cervical lordosis. There is trace anterolisthesis of C5 on C6, unchanged. There is no other antero or retrolisthesis. There is no jumped or perched facet or other evidence of traumatic malalignment. Skull base and vertebrae: Skull base alignment is maintained. Vertebral body heights are preserved. There is no evidence of acute fracture. There is no suspicious osseous lesion. Soft tissues and spinal canal: No prevertebral fluid or swelling. No visible canal hematoma. Disc levels: There is disc space narrowing and degenerative endplate change most advanced at C6-C7. There is multilevel facet arthropathy there is no high-grade spinal canal stenosis. Upper chest: The imaged lung apices are clear. Other: None. IMPRESSION: 1. No acute intracranial pathology. 2. No acute fracture or traumatic malalignment  of the cervical spine. Electronically Signed   By: Valetta Mole M.D.   On: 08/27/2022 12:12   CT CHEST ABDOMEN PELVIS WO CONTRAST  Result Date: 08/27/2022 CLINICAL DATA:  Unwitnessed  fall, found down. EXAM: CT CHEST, ABDOMEN AND PELVIS WITHOUT CONTRAST TECHNIQUE: Multidetector CT imaging of the chest, abdomen and pelvis was performed following the standard protocol without IV contrast. RADIATION DOSE REDUCTION: This exam was performed according to the departmental dose-optimization program which includes automated exposure control, adjustment of the mA and/or kV according to patient size and/or use of iterative reconstruction technique. COMPARISON:  Same day chest radiograph FINDINGS: CT CHEST FINDINGS Cardiovascular: The heart size is normal. There is a trace pericardial effusion dense coronary artery calcifications are seen primarily affecting the left anterior descending artery. There is minimal calcified plaque in the thoracic aorta. Mediastinum/Nodes: The thyroid is unremarkable. The esophagus is grossly unremarkable. There is no mediastinal or axillary lymphadenopathy. There is no bulky hilar adenopathy, within the confines of noncontrast technique. Lungs/Pleura: The trachea and central airways are patent. There is no focal consolidation or pulmonary edema. There is no pleural effusion or pneumothorax. There are no suspicious nodules. Musculoskeletal: There is an acute nondisplaced fracture of the right anterior fifth rib (5-104). No other rib fracture is seen. There is no sternal fracture. There is no acute fracture or traumatic malalignment of the thoracic spine. There is multilevel degenerative change of the thoracic spine with flowing anterior osteophytes. CT ABDOMEN PELVIS FINDINGS Hepatobiliary: The liver and gallbladder are unremarkable. There is no biliary ductal dilatation. Pancreas: Unremarkable. Spleen: Unremarkable. Adrenals/Urinary Tract: The adrenals are unremarkable. The kidneys are unremarkable, with no focal lesion, stone, hydronephrosis, or hydroureter. The bladder is unremarkable. Stomach/Bowel: The stomach is unremarkable. There is no evidence of bowel obstruction.  There is no abnormal bowel wall thickening or inflammatory change. There is colonic diverticulosis without evidence of acute diverticulitis. The appendix is normal. Vascular/Lymphatic: There is mild calcified plaque in the nonaneurysmal abdominal aorta. There is no abdominal or pelvic lymphadenopathy. Reproductive: The prostate and seminal vesicles are unremarkable. Other: There is no ascites or free air. Musculoskeletal: There is no acute fracture in the pelvis. There is no acute fracture or traumatic malalignment of the lumbar spine. There is multilevel degenerative change. IMPRESSION: 1. Acute nondisplaced fracture of the right anterior fifth rib. 2. No other evidence of acute pathology in the chest, abdomen, or pelvis. 3. Trace pericardial effusion. 4. Dense coronary artery calcifications primarily affecting the left anterior descending artery. 5. Diverticulosis without evidence of acute diverticulitis. Electronically Signed   By: Valetta Mole M.D.   On: 08/27/2022 12:05   DG Chest Port 1 View  Result Date: 08/27/2022 CLINICAL DATA:  Two falls last night EXAM: PORTABLE CHEST 1 VIEW COMPARISON:  Chest radiograph 05/28/2021 FINDINGS: The cardiomediastinal silhouette is stable and within normal limits allowing for leftward patient rotation. There is no focal consolidation or pulmonary edema. There is no pleural effusion or pneumothorax. No displaced rib fracture or other acute osseous abnormality is identified. IMPRESSION: Stable chest with no radiographic evidence of acute cardiopulmonary process. Electronically Signed   By: Valetta Mole M.D.   On: 08/27/2022 10:29     Scheduled Meds:  vitamin C  500 mg Oral Daily   aspirin EC  81 mg Oral Daily   carvedilol  3.125 mg Oral BID   citalopram  20 mg Oral Daily   clopidogrel  75 mg Oral Daily   heparin  5,000 Units Subcutaneous Q8H  insulin aspart  0-15 Units Subcutaneous TID WC   insulin aspart  0-5 Units Subcutaneous QHS   Ipratropium-Albuterol  1  puff Inhalation Q6H   multivitamin with minerals  1 tablet Oral Daily   nirmatrelvir/ritonavir EUA (renal dosing)  2 tablet Oral BID   potassium chloride SA  20 mEq Oral Daily   sodium chloride flush  3 mL Intravenous Q12H   sodium chloride flush  3 mL Intravenous Q12H   zinc sulfate  220 mg Oral Daily   Continuous Infusions:  sodium chloride 125 mL/hr at 08/28/22 1249   sodium chloride     lactated ringers Stopped (08/27/22 2119)     LOS: 0 days    Roxan Hockey M.D on 08/28/2022 at 7:20 PM  Go to [email or web address] - for contact info  [facility] - Office  [phone]  If 7PM-7AM, please contact night-coverage [email or web address] 08/28/2022, 7:20 PM

## 2022-08-28 NOTE — Plan of Care (Signed)
  Problem: Acute Rehab PT Goals(only PT should resolve) Goal: Pt Will Go Supine/Side To Sit Outcome: Progressing Flowsheets (Taken 08/28/2022 1421) Pt will go Supine/Side to Sit:  with supervision  with min guard assist Goal: Patient Will Transfer Sit To/From Stand Outcome: Progressing Flowsheets (Taken 08/28/2022 1421) Patient will transfer sit to/from stand:  with supervision  with min guard assist Goal: Pt Will Transfer Bed To Chair/Chair To Bed Outcome: Progressing Flowsheets (Taken 08/28/2022 1421) Pt will Transfer Bed to Chair/Chair to Bed:  with supervision  min guard assist Goal: Pt Will Ambulate Outcome: Progressing Flowsheets (Taken 08/28/2022 1421) Pt will Ambulate:  75 feet  with supervision  with least restrictive assistive device   2:21 PM, 08/28/22 Lonell Grandchild, MPT Physical Therapist with [facility] [phone] office [phone] mobile phone

## 2022-08-28 NOTE — Progress Notes (Incomplete)
*  PRELIMINARY RESULTS* Echocardiogram 2D Echocardiogram has been performed.  Stephen Graves 08/28/2022, 10:50 AM

## 2022-08-28 NOTE — Evaluation (Signed)
Physical Therapy Evaluation Patient Details Name: Stephen Graves MRN: [id number] DOB: June 03, 1956 Today's Date: 08/28/2022  History of Present Illness  Stephen Graves  is a 66 y.o. male  with PMH of CAD s/p DES to mid-distal LAD in 06/2017, chronic chest pain, hypertension, hyperlipidemia, anxiety presents to the ED via EMS after falling twice at home since last night.  Patient was found by his brother around 52 am on 08/27/2022,   -Apparently fell for the second time and laid on his belly could not get up unfortunately his belly was lying against his heater vent---some erythema over the anterior abdomen is noted  -  Patient does not think he lost consciousness  -Denies frank chest pains or palpitations dizziness  -  No Nausea, Vomiting or Diarrhea     -In the ED patient is found to be positive for COVID-19 infection   Clinical Impression  Patient has difficulty sitting up at bedside due to generalized weakness, required Min guard/Min assist for completing sit to stands and limited for ambulating in room due to c/o fatigue.  Patient tolerated sitting up in chair after therapy - nursing staff notified.  Patient will benefit from continued skilled physical therapy in hospital and recommended venue below to increase strength, balance, endurance for safe ADLs and gait.         Recommendations for follow up therapy are one component of a multi-disciplinary discharge planning process, led by the attending physician.  Recommendations may be updated based on patient status, additional functional criteria and insurance authorization.  Follow Up Recommendations Skilled nursing-short term rehab (<3 hours/day) Can patient physically be transported by private vehicle: Yes    Assistance Recommended at Discharge Set up Supervision/Assistance  Patient can return home with the following  A little help with walking and/or transfers;A little help with bathing/dressing/bathroom;Help with stairs or ramp for  entrance;Assistance with cooking/housework    Equipment Recommendations Cane  Recommendations for Other Services       Functional Status Assessment Patient has had a recent decline in their functional status and demonstrates the ability to make significant improvements in function in a reasonable and predictable amount of time.     Precautions / Restrictions Precautions Precautions: Fall Restrictions Weight Bearing Restrictions: No      Mobility  Bed Mobility Overal bed mobility: Needs Assistance Bed Mobility: Supine to Sit     Supine to sit: Min guard, Min assist     General bed mobility comments: increased time, labored movement    Transfers Overall transfer level: Needs assistance Equipment used: 1 person hand held assist Transfers: Sit to/from Stand, Bed to chair/wheelchair/BSC Sit to Stand: Min assist, Min guard   Step pivot transfers: Min guard, Min assist       General transfer comment: has difficulty completing sit to stands due to BLE weakness    Ambulation/Gait Ambulation/Gait assistance: Min guard Gait Distance (Feet): 30 Feet Assistive device: 1 person hand held assist Gait Pattern/deviations: Decreased step length - right, Decreased step length - left, Decreased stride length Gait velocity: decreased     General Gait Details: slightly labored cadence having to occasionally lean on nearby objects for support, limited mostly due to fatigue  Stairs            Wheelchair Mobility    Modified Rankin (Stroke Patients Only)       Balance Overall balance assessment: Needs assistance Sitting-balance support: Feet supported Sitting balance-Leahy Scale: Fair Sitting balance - Comments: fair/good seated at EOB  Standing balance support: No upper extremity supported, During functional activity Standing balance-Leahy Scale: Fair Standing balance comment: without AD                             Pertinent Vitals/Pain Pain  Assessment Pain Assessment: Faces Faces Pain Scale: Hurts a little bit Pain Location: right side of rib cage with pressure Pain Descriptors / Indicators: Discomfort, Sore Pain Intervention(s): Limited activity within patient's tolerance, Monitored during session, Repositioned    Home Living Family/patient expects to be discharged to:: Private residence Living Arrangements: Alone Available Help at Discharge: Available PRN/intermittently Type of Home: Mobile home Home Access: Stairs to enter Entrance Stairs-Rails: Left Entrance Stairs-Number of Steps: 4   Home Layout: One level Home Equipment: Conservation officer, nature (2 wheels)      Prior Function Prior Level of Function : Independent/Modified Independent             Mobility Comments: household and short distanced community ambulator without AD, uses electric cart at grocery stores, doses not drive ADLs Comments: assisted for community ADLs by family     Hand Dominance   Dominant Hand: Right    Extremity/Trunk Assessment   Upper Extremity Assessment Upper Extremity Assessment: Generalized weakness    Lower Extremity Assessment Lower Extremity Assessment: Generalized weakness    Cervical / Trunk Assessment Cervical / Trunk Assessment: Normal  Communication   Communication: No difficulties  Cognition Arousal/Alertness: Awake/alert Behavior During Therapy: WFL for tasks assessed/performed Overall Cognitive Status: Within Functional Limits for tasks assessed                                          General Comments      Exercises     Assessment/Plan    PT Assessment Patient needs continued PT services  PT Problem List Decreased strength;Decreased activity tolerance;Decreased balance;Decreased mobility       PT Treatment Interventions DME instruction;Gait training;Stair training;Functional mobility training;Therapeutic activities;Therapeutic exercise;Patient/family education;Balance training     PT Goals (Current goals can be found in the Care Plan section)  Acute Rehab PT Goals Patient Stated Goal: return home after rehab PT Goal Formulation: With patient Time For Goal Achievement: 09/11/22 Potential to Achieve Goals: Good    Frequency Min 3X/week     Co-evaluation               AM-PAC PT "6 Clicks" Mobility  Outcome Measure Help needed turning from your back to your side while in a flat bed without using bedrails?: A Little Help needed moving from lying on your back to sitting on the side of a flat bed without using bedrails?: A Little Help needed moving to and from a bed to a chair (including a wheelchair)?: A Little Help needed standing up from a chair using your arms (e.g., wheelchair or bedside chair)?: A Little Help needed to walk in hospital room?: A Little Help needed climbing 3-5 steps with a railing? : A Lot 6 Click Score: 17    End of Session   Activity Tolerance: Patient tolerated treatment well;Patient limited by fatigue Patient left: in chair;with call bell/phone within reach;with chair alarm set Nurse Communication: Mobility status PT Visit Diagnosis: Unsteadiness on feet (R26.81);Other abnormalities of gait and mobility (R26.89);Muscle weakness (generalized) (M62.81)    Time: 8338-2505 PT Time Calculation (min) (ACUTE ONLY): 27 min   Charges:  PT Evaluation $PT Eval Moderate Complexity: 1 Mod PT Treatments $Therapeutic Activity: 23-37 mins        2:19 PM, 08/28/22 Lonell Grandchild, MPT Physical Therapist with [facility] [phone] office [phone] mobile phone

## 2022-08-28 NOTE — NC FL2 (Signed)
[place] LEVEL OF CARE FORM     IDENTIFICATION  Patient Name: WYATTE DAMES Birthdate: [birth year] Sex: male Admission Date (Current Location): 08/27/2022  [facility] and [place] Number:  [employer] and Address:  [place] [address] [place]      Provider Number: [id number]  Attending Physician Name and Address:  Roxan Hockey, MD  Relative Name and Phone Number:  Charolotte Capuchin (Sister) [phone] (    Current Level of Care: Hospital Recommended Level of Care: [place] Prior Approval Number:    Date Approved/Denied:   PASRR Number: [id number]  Discharge Plan: SNF    Current Diagnoses: Patient Active Problem List   Diagnosis Date Noted   Falls 08/27/2022   COVID-19 virus infection 08/27/2022   Osteoarthritis of right knee 11/02/2020   S/P TKR (total knee replacement), right 11/02/2020   Osteoarthritis of left knee 07/30/2018   Coronary artery disease involving native coronary 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 ([place]) 07/18/2017   Acute coronary syndrome ([place])    Chest pain 07/16/2017   Hyperlipidemia with target LDL less than 70 07/16/2017   Presence of drug coated stent in LAD coronary artery 06/2017   Anxiety state 02/15/2010   Essential hypertension 02/15/2010   CHEST PAIN UNSPECIFIED 02/15/2010    Orientation RESPIRATION BLADDER Height & Weight     Self, Time, Place  Normal Continent Weight: 99.3 kg Height:  '5\' 11"'$  (180.3 cm)  BEHAVIORAL SYMPTOMS/MOOD NEUROLOGICAL BOWEL NUTRITION STATUS      Continent Diet (See DC summary)  AMBULATORY STATUS COMMUNICATION OF NEEDS Skin   Extensive Assist Verbally Normal                       Personal Care Assistance Level of Assistance  Bathing, Feeding, Dressing Bathing Assistance: Maximum assistance Feeding assistance: Independent Dressing Assistance: Limited assistance     Functional Limitations Info  Sight,  Hearing, Speech Sight Info: Adequate Hearing Info: Impaired Speech Info: Adequate    SPECIAL CARE FACTORS FREQUENCY  PT (By licensed PT)     PT Frequency: 5 TIMES A WEEK              Contractures Contractures Info: Not present    Additional Factors Info  Code Status, Allergies Code Status Info: FULL Allergies Info: Lipitor           Current Medications (08/28/2022):  This is the current hospital active medication list Current Facility-Administered Medications  Medication Dose Route Frequency Provider Last Rate Last Admin   0.9 %  sodium chloride infusion   Intravenous Continuous Emokpae, Courage, MD 125 mL/hr at 08/28/22 1249 New Bag at 08/28/22 1249   0.9 %  sodium chloride infusion   Intravenous PRN Emokpae, Courage, MD       acetaminophen (TYLENOL) tablet 650 mg  650 mg Oral Q6H PRN Emokpae, Courage, MD       Or   acetaminophen (TYLENOL) suppository 650 mg  650 mg Rectal Q6H PRN Emokpae, Courage, MD       ascorbic acid (VITAMIN C) tablet 500 mg  500 mg Oral Daily Emokpae, Courage, MD   500 mg at 08/28/22 1041   aspirin EC tablet 81 mg  81 mg Oral Daily Emokpae, Courage, MD   81 mg at 08/28/22 1040   bisacodyl (DULCOLAX) suppository 10 mg  10 mg Rectal Daily PRN Roxan Hockey, MD       carvedilol (COREG) tablet 3.125  mg  3.125 mg Oral BID Denton Brick, Courage, MD   3.125 mg at 08/28/22 1041   chlorpheniramine-HYDROcodone (TUSSIONEX) 10-8 MG/5ML suspension 5 mL  5 mL Oral Q12H PRN Emokpae, Courage, MD       citalopram (CELEXA) tablet 20 mg  20 mg Oral Daily Emokpae, Courage, MD   20 mg at 08/28/22 1041   clopidogrel (PLAVIX) tablet 75 mg  75 mg Oral Daily Emokpae, Courage, MD   75 mg at 08/28/22 1040   guaiFENesin-dextromethorphan (ROBITUSSIN DM) 100-10 MG/5ML syrup 10 mL  10 mL Oral Q4H PRN Roxan Hockey, MD       heparin injection 5,000 Units  5,000 Units Subcutaneous Q8H Emokpae, Courage, MD   5,000 Units at 08/28/22 1252   hydrALAZINE (APRESOLINE) injection 10 mg  10  mg Intravenous Q4H PRN Emokpae, Courage, MD       insulin aspart (novoLOG) injection 0-15 Units  0-15 Units Subcutaneous TID WC Emokpae, Courage, MD   2 Units at 08/28/22 1251   insulin aspart (novoLOG) injection 0-5 Units  0-5 Units Subcutaneous QHS Emokpae, Courage, MD       Ipratropium-Albuterol (COMBIVENT) respimat 1 puff  1 puff Inhalation Q6H Emokpae, Courage, MD   1 puff at 08/28/22 0854   lactated ringers infusion   Intravenous Continuous Roxan Hockey, MD   Stopped at 08/27/22 2119   multivitamin with minerals tablet 1 tablet  1 tablet Oral Daily Roxan Hockey, MD   1 tablet at 08/28/22 1041   nirmatrelvir/ritonavir EUA (renal dosing) (PAXLOVID) 2 tablet  2 tablet Oral BID Roxan Hockey, MD   2 tablet at 08/27/22 2336   ondansetron (ZOFRAN) tablet 4 mg  4 mg Oral Q6H PRN Emokpae, Courage, MD       Or   ondansetron (ZOFRAN) injection 4 mg  4 mg Intravenous Q6H PRN Emokpae, Courage, MD       oxyCODONE (Oxy IR/ROXICODONE) immediate release tablet 5 mg  5 mg Oral Q8H PRN Emokpae, Courage, MD       polyethylene glycol (MIRALAX / GLYCOLAX) packet 17 g  17 g Oral Daily PRN Emokpae, Courage, MD       potassium chloride SA (KLOR-CON M) CR tablet 20 mEq  20 mEq Oral Daily Emokpae, Courage, MD   20 mEq at 08/28/22 1041   sodium chloride flush (NS) 0.9 % injection 3 mL  3 mL Intravenous Q12H Emokpae, Courage, MD       sodium chloride flush (NS) 0.9 % injection 3 mL  3 mL Intravenous Q12H Emokpae, Courage, MD   3 mL at 08/28/22 1043   sodium chloride flush (NS) 0.9 % injection 3 mL  3 mL Intravenous PRN Emokpae, Courage, MD       traZODone (DESYREL) tablet 50 mg  50 mg Oral QHS PRN Emokpae, Courage, MD       zinc sulfate capsule 220 mg  220 mg Oral Daily Emokpae, Courage, MD   220 mg at 08/28/22 1041     Discharge Medications: Please see discharge summary for a list of discharge medications.  Relevant Imaging Results:  Relevant Lab Results:   Additional Information SS#$  [id number]  Boneta Lucks, RN

## 2022-08-28 NOTE — TOC Initial Note (Signed)
Transition of Care [facility]) - Initial/Assessment Note    Patient Details  Name: Stephen Graves MRN: [id number] Date of Birth: Feb 16, 1956  [facility]) CM/SW Contact:    Boneta Lucks, RN Phone Number: 08/28/2022, 2:31 PM  Clinical Narrative:     Patient admitted with Falls. Lives home alone, covid positive. PT is recommending SNF. Patient is agreeable. First choice is [facility]. FL2 completed and sent out. TOC will start INS AUTH when medically cleared for rehab.               Expected Discharge Plan: Skilled Nursing Facility Barriers to Discharge: Continued Medical Work up   Patient Goals and CMS Choice Patient states their goals for this hospitalization and ongoing recovery are:: agreeabel to SNF CMS Medicare.gov Compare Post Acute Care list provided to:: Patient Choice offered to / list presented to : Patient  Expected Discharge Plan and Services Expected Discharge Plan: [place]       Living arrangements for the past 2 months: Mobile Home                       Prior Living Arrangements/Services Living arrangements for the past 2 months: Mobile Home Lives with:: Self Patient language and need for interpreter reviewed:: Yes Do you feel safe going back to the place where you live?: Yes      Need for Family Participation in Patient Care: Yes (Comment) Care giver support system in place?: Yes (comment)   Criminal Activity/Legal Involvement Pertinent to Current Situation/Hospitalization: No - Comment as needed  Activities of Daily Living Home Assistive Devices/Equipment: None ADL Screening (condition at time of admission) Patient's cognitive ability adequate to safely complete daily activities?: No Is the patient deaf or have difficulty hearing?: Yes Does the patient have difficulty seeing, even when wearing glasses/contacts?: Yes Does the patient have difficulty concentrating, remembering, or making decisions?: Yes (currently  confused) Patient able to express need for assistance with ADLs?: Yes Does the patient have difficulty dressing or bathing?: Yes Independently performs ADLs?: No Communication: Independent Dressing (OT): Needs assistance Is this a change from baseline?: Pre-admission baseline Grooming: Needs assistance Is this a change from baseline?: Change from baseline, expected to last <3 days Feeding: Needs assistance Is this a change from baseline?: Change from baseline, expected to last <3 days Bathing: Needs assistance Is this a change from baseline?: Change from baseline, expected to last <3 days Toileting: Needs assistance Is this a change from baseline?: Change from baseline, expected to last <3 days In/Out Bed: Needs assistance Is this a change from baseline?: Change from baseline, expected to last <3 days Walks in Home: Needs assistance Is this a change from baseline?: Change from baseline, expected to last <3 days Does the patient have difficulty walking or climbing stairs?: Yes Weakness of Legs: Both Weakness of Arms/Hands: Both  Permission Sought/Granted            Permission granted to share info w Relationship: sister     Emotional Assessment     Affect (typically observed): Accepting Orientation: : Oriented to Self, Oriented to Place, Oriented to Situation Alcohol / Substance Use: Not Applicable Psych Involvement: No (comment)  Admission diagnosis:  Elevated CK [R74.8] Generalized weakness [R53.1] Falls [W19.XXXA] Fall, initial encounter [id number]] COVID-19 [U07.1] Patient Active Problem List   Diagnosis Date Noted   Falls 08/27/2022   COVID-19 virus infection 08/27/2022   Osteoarthritis of right knee 11/02/2020   S/P TKR (total knee  replacement), right 11/02/2020   Osteoarthritis of left knee 07/30/2018   Coronary artery disease involving native coronary 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 ([place]) 07/18/2017   Acute coronary syndrome ([place])     Chest pain 07/16/2017   Hyperlipidemia with target LDL less than 70 07/16/2017   Presence of drug coated stent in LAD coronary artery 06/2017   Anxiety state 02/15/2010   Essential hypertension 02/15/2010   CHEST PAIN UNSPECIFIED 02/15/2010   PCP:  Pllc, [place]:   [place], [place] - [address] [place] [address] [place] [address] Phone: [phone] Fax: [phone]  [facility] [address] [place] [address] Phone: [phone] Fax: [phone]  Readmission Risk Interventions     No data to display

## 2022-08-29 DIAGNOSIS — I10 Essential (primary) hypertension: Secondary | ICD-10-CM | POA: Diagnosis not present

## 2022-08-29 DIAGNOSIS — R531 Weakness: Secondary | ICD-10-CM

## 2022-08-29 DIAGNOSIS — U071 COVID-19: Secondary | ICD-10-CM | POA: Diagnosis not present

## 2022-08-29 DIAGNOSIS — Z955 Presence of coronary angioplasty implant and graft: Secondary | ICD-10-CM | POA: Diagnosis not present

## 2022-08-29 DIAGNOSIS — W19XXXD Unspecified fall, subsequent encounter: Secondary | ICD-10-CM

## 2022-08-29 LAB — COMPREHENSIVE METABOLIC PANEL
ALT: 18 U/L (ref 0–44)
AST: 37 U/L (ref 15–41)
Albumin: 2.7 g/dL — ABNORMAL LOW (ref 3.5–5.0)
Alkaline Phosphatase: 49 U/L (ref 38–126)
Anion gap: 8 (ref 5–15)
BUN: 28 mg/dL — ABNORMAL HIGH (ref 8–23)
CO2: 21 mmol/L — ABNORMAL LOW (ref 22–32)
Calcium: 8 mg/dL — ABNORMAL LOW (ref 8.9–10.3)
Chloride: 110 mmol/L (ref 98–111)
Creatinine, Ser: 1.09 mg/dL (ref 0.61–1.24)
GFR, Estimated: >60 mL/min (ref >60)
Glucose, Bld: 93 mg/dL (ref 70–99)
Potassium: 3.6 mmol/L (ref 3.5–5.1)
Sodium: 139 mmol/L (ref 135–145)
Total Bilirubin: 0.9 mg/dL (ref 0.3–1.2)
Total Protein: 5.3 g/dL — ABNORMAL LOW (ref 6.5–8.1)

## 2022-08-29 LAB — GLUCOSE, CAPILLARY
Glucose-Capillary: 98 mg/dL (ref 70–99)
Glucose-Capillary: 152 mg/dL — ABNORMAL HIGH (ref 70–99)
Glucose-Capillary: 95 mg/dL (ref 70–99)
Glucose-Capillary: 99 mg/dL (ref 70–99)

## 2022-08-29 LAB — CBC WITH DIFFERENTIAL/PLATELET
Abs Immature Granulocytes: 0.01 K/uL (ref 0.00–0.07)
Basophils Absolute: 0 K/uL (ref 0.0–0.1)
Basophils Relative: 1 %
Eosinophils Absolute: 0 K/uL (ref 0.0–0.5)
Eosinophils Relative: 1 %
HCT: 32.7 % — ABNORMAL LOW (ref 39.0–52.0)
Hemoglobin: 10.1 g/dL — ABNORMAL LOW (ref 13.0–17.0)
Immature Granulocytes: 0 %
Lymphocytes Relative: 21 %
Lymphs Abs: 0.8 K/uL (ref 0.7–4.0)
MCH: 27.8 pg (ref 26.0–34.0)
MCHC: 30.9 g/dL (ref 30.0–36.0)
MCV: 90.1 fL (ref 80.0–100.0)
Monocytes Absolute: 0.4 K/uL (ref 0.1–1.0)
Monocytes Relative: 11 %
Neutro Abs: 2.5 K/uL (ref 1.7–7.7)
Neutrophils Relative %: 66 %
Platelets: 95 K/uL — ABNORMAL LOW (ref 150–400)
RBC: 3.63 MIL/uL — ABNORMAL LOW (ref 4.22–5.81)
RDW: 15.1 % (ref 11.5–15.5)
WBC: 3.7 K/uL — ABNORMAL LOW (ref 4.0–10.5)
nRBC: 0 % (ref 0.0–0.2)

## 2022-08-29 LAB — CK: Total CK: 1197 U/L — ABNORMAL HIGH (ref 49–397)

## 2022-08-29 LAB — HEMOGLOBIN A1C
Hgb A1c MFr Bld: 6 % — ABNORMAL HIGH (ref 4.8–5.6)
Mean Plasma Glucose: 126 mg/dL

## 2022-08-29 LAB — FERRITIN: Ferritin: 16 ng/mL — ABNORMAL LOW (ref 24–336)

## 2022-08-29 LAB — PHOSPHORUS: Phosphorus: 3.1 mg/dL (ref 2.5–4.6)

## 2022-08-29 LAB — MAGNESIUM: Magnesium: 2.2 mg/dL (ref 1.7–2.4)

## 2022-08-29 LAB — C-REACTIVE PROTEIN: CRP: 5.8 mg/dL — ABNORMAL HIGH (ref <1.0)

## 2022-08-29 MED ORDER — IPRATROPIUM-ALBUTEROL 20-100 MCG/ACT IN AERS
1.0000 | INHALATION_SPRAY | Freq: Two times a day (BID) | RESPIRATORY_TRACT | Status: DC
  Administered 2022-08-29 – 2022-08-30 (×2): 1 via RESPIRATORY_TRACT

## 2022-08-29 NOTE — Progress Notes (Signed)
PROGRESS NOTE     Stephen Graves, is a 66 y.o. male, DOB - Jul 10, 1956, IFO:[id number]  Admit date - 08/27/2022   Admitting Physician Courage Denton Brick, MD  Outpatient Primary MD for the patient is [facility]  LOS - 1  Chief Complaint  Patient presents with   Fall        Brief Narrative:   66 y.o. male  with PMH of CAD s/p DES to mid-distal LAD in 06/2017, chronic chest pain, hypertension, hyperlipidemia, anxiety admitted from home on 08/27/2022 after recurrent falls with right-sided rib fractures and elevated CKs in the setting of COVID-19 infection    -Assessment and Plan: 1)S/p Fall with right anterior fifth rib fracture and elevated CPK/dehydration/traumatic rhabdomyolysis: -CK 2,783 >>> 2,743>>1197 -Lactic acid 2.5 repeat 2.1 -Patient denies chest pains or syncope -Troponin 48 >> 41 -EKG is not acute -D-dimer is elevated however VQ scan is unremarkable --T. bili 4.5 AST of 51 otherwise LFTs WNL -Chest x-ray without acute findings -CT head and brain MRI without acute findings CT C-spine without acute findings CT abdomen pelvis without acute findings -CT chest with acute nondisplaced fracture of the right anterior fifth rib -Continue to maintain adequate hydration.  -Continue to hold Crestor due to elevated CPKs -Physical therapist recommends SNF rehab   2) H/o chronic anemia---- hemoglobin 13.2 which is higher than baseline suspect some degree of hemoconcentration- --anticipate that hemoglobin will drop with hydration/hemodilution -on 08/28/2022--Hgb is now back to baseline range currently 10.8 after hydration -No overt bleeding appreciated.   3) mild thrombocytopenia --not new monitor closely,  no bleeding concerns at this time   4)AKI--in retrospect the patient does not have CKD most likely has just AKI -Admission creatinine 1.46 -With hydration creatinine is down to 1.0 with GFR greater than 60 --renally adjust medications, avoid nephrotoxic  agents / dehydration  / hypotension   5)COVID-19 infection --no significant hypoxia -Continue treat empirically with Paxlovid -No Covid pneumonia on chest imaging studies hold off on steroids --Check and trend inflammatory markers including D-dimer, ferritin and  CRP---also follow CBC and CMP --Supplemental oxygen to keep O2 sats above 93% ---- Encourage prone positioning  if able to tolerate --Attempt to maintain euvolemic state --Zinc and vitamin C as ordered -Albuterol inhaler as needed -Accu-Cheks/fingersticks while on high-dose steroids   6)Hx of CAD s/p stent to LAD: Patient had LHC in September 2023  CTO treated with successful PCI/DES overlapping previously placed stent. Planned for DAPT with ASA/Brilinta for at least one year, likely lifelong given extensive stenting in the LAD. No recurrent chest pain. Seen by CR.  -- continue on ASA, plavix, Coreg 6.'25mg'$  BID,  -Prior echo showed  EF 60-65% with no rWMA noted, severe biatrial enlargement -Repeat echo from 08/28/2022 with EF of 60 to 65%, no regional wall motion abnormalities, no aortic stenosis no mitral stenosis -Continue to hold Crestor due to elevated CPKs -Continue to hold losartan due to concerns about risk of AKI on CKD given dehydration   7)DM2- Hgb A1c 6.4 reflecting fair diabetic control PTA -Hold metformin Use Novolog/Humalog Sliding scale insulin with Accu-Cheks/Fingersticks as ordered    8)Anxiety: c/n  Celexa  9)Disposition/Recurrent falls---PTA pt lived alone and did very poorly, patient has significant limitations with mobility related ADLs- this patient needs to continue to be monitored in the hospital until a SNF bed is obtained as she is not safe to go home with her current physcical limitations -Physical therapy evaluation appreciated recommends SNF rehab   Dispo: The  patient is from: Home              Anticipated d/c is to: SNF              Anticipated d/c to skilled nursing facility once bed  available/insurance authorization clear.              Patient currently is medically stable to d/c.  Barriers: Not Clinically Stable-   Code Status : -  Code Status: Full Code   Family Communication:   NA (patient is alert, awake and coherent)  DVT Prophylaxis  :   - SCDs   heparin injection 5,000 Units Start: 08/27/22 2200 SCDs Start: 08/27/22 1607 Place TED hose Start: 08/27/22 1607   Lab Results  Component Value Date   PLT 95 (L) 08/29/2022    Inpatient Medications  Scheduled Meds:  vitamin C  500 mg Oral Daily   aspirin EC  81 mg Oral Daily   carvedilol  3.125 mg Oral BID   citalopram  20 mg Oral Daily   clopidogrel  75 mg Oral Daily   heparin  5,000 Units Subcutaneous Q8H   insulin aspart  0-15 Units Subcutaneous TID WC   insulin aspart  0-5 Units Subcutaneous QHS   Ipratropium-Albuterol  1 puff Inhalation BID   multivitamin with minerals  1 tablet Oral Daily   nirmatrelvir/ritonavir EUA (renal dosing)  2 tablet Oral BID   potassium chloride SA  20 mEq Oral Daily   sodium chloride flush  3 mL Intravenous Q12H   sodium chloride flush  3 mL Intravenous Q12H   zinc sulfate  220 mg Oral Daily   Continuous Infusions:  sodium chloride 125 mL/hr at 08/29/22 1325   sodium chloride     lactated ringers Stopped (08/27/22 2119)   PRN Meds:.sodium chloride, acetaminophen **OR** acetaminophen, bisacodyl, chlorpheniramine-HYDROcodone, guaiFENesin-dextromethorphan, hydrALAZINE, ondansetron **OR** ondansetron (ZOFRAN) IV, oxyCODONE, polyethylene glycol, sodium chloride flush, traZODone   Anti-infectives (From admission, onward)    Start     Dose/Rate Route Frequency Ordered Stop   08/27/22 1500  nirmatrelvir/ritonavir EUA (renal dosing) (PAXLOVID) 2 tablet        2 tablet Oral 2 times daily 08/27/22 1419 09/01/22 1740         Subjective: Stephen Graves reports feeling better; no chest pain, no nausea, no vomiting.  Still weak and deconditioned on examination.  Very  fatigued.   Objective: Vitals:   08/29/22 0530 08/29/22 0851 08/29/22 1006 08/29/22 1730  BP: (!) 140/101  127/85 (!) 153/94  Pulse: 83  82 87  Resp: '16  19 20  '$ Temp: 99 F (37.2 C)  98.5 F (36.9 C) 99.3 F (37.4 C)  TempSrc: Oral  Oral Oral  SpO2: 98% 97% 100% 99%  Weight:      Height:        Intake/Output Summary (Last 24 hours) at 08/29/2022 1752 Last data filed at 08/29/2022 1300 Gross per 24 hour  Intake 1332.08 ml  Output 700 ml  Net 632.08 ml   Filed Weights   08/27/22 0919 08/28/22 0023  Weight: 97.1 kg 99.3 kg    Physical Exam General exam: Alert, awake, oriented x 3; no acute distress; reports no chest pain.  Patient is afebrile. Respiratory system: Good air movement bilaterally; no using accessory muscle.  Good saturation on room air. Cardiovascular system:RRR. No murmurs, rubs, gallops. Gastrointestinal system: Abdomen is nondistended, soft and nontender. No organomegaly or masses felt. Normal bowel sounds heard. Central nervous system: Alert and  oriented. No focal neurological deficits. Extremities: No cyanosis or clubbing. Skin: No rashes, lesions or ulcers Psychiatry: Judgement and insight appear normal. Mood & affect appropriate.   Data Reviewed: I have personally reviewed following labs and imaging studies  CBC: Recent Labs  Lab 08/27/22 0915 08/28/22 0535 08/29/22 0419  WBC 9.7 6.2 3.7*  NEUTROABS 8.6* 4.6 2.5  HGB 13.2 10.8* 10.1*  HCT 41.7 34.1* 32.7*  MCV 88.7 89.3 90.1  PLT 144* 110* 95*   Basic Metabolic Panel: Recent Labs  Lab 08/27/22 0915 08/28/22 0535 08/29/22 0419  NA 136 137 139  K 4.0 3.7 3.6  CL 103 107 110  CO2 22 22 21*  GLUCOSE 174* 101* 93  BUN 35* 32* 28*  CREATININE 1.46* 1.08 1.09  CALCIUM 9.1 8.4* 8.0*  MG 2.1 2.1 2.2  PHOS  --  3.3 3.1   GFR: Estimated Creatinine Clearance: 77.9 mL/min (by C-G formula based on SCr of 1.09 mg/dL).  Liver Function Tests: Recent Labs  Lab 08/27/22 0915 08/28/22 0535  08/29/22 0419  AST 51* 44* 37  ALT '21 18 18  '$ ALKPHOS 68 49 49  BILITOT 1.5* 1.2 0.9  PROT 6.9 5.7* 5.3*  ALBUMIN 3.8 3.0* 2.7*   Cardiac Enzymes: Recent Labs  Lab 08/27/22 0915 08/27/22 1156 08/29/22 0419  CKTOTAL 2,783* 2,743* 1,197*   Recent Results (from the past 240 hour(s))  Urine Culture     Status: None   Collection Time: 08/27/22  9:32 AM   Specimen: Urine, Catheterized  Result Value Ref Range Status   Specimen Description   Final    URINE, CATHETERIZED Performed at [facility], [address] [place], [place] [address]    Special Requests   Final    NONE Performed at [facility], [address], [place], [place] [address]    Culture   Final    NO GROWTH Performed at [facility], [employer] [address] [place], [place] [address]    Report Status 08/28/2022 FINAL  Final  Resp Panel by RT-PCR (Flu A&B, Covid) Anterior Nasal Swab     Status: Abnormal   Collection Time: 08/27/22  9:33 AM   Specimen: Anterior Nasal Swab  Result Value Ref Range Status   SARS Coronavirus 2 by RT PCR POSITIVE (A) NEGATIVE Final    Comment: (NOTE) SARS-CoV-2 target nucleic acids are DETECTED.  The SARS-CoV-2 RNA is generally detectable in upper respiratory specimens during the acute phase of infection. Positive results are indicative of the presence of the identified virus, but do not rule out bacterial infection or co-infection with other pathogens not detected by the test. Clinical correlation with patient history and other diagnostic information is necessary to determine patient infection status. The expected result is Negative.  Fact Sheet for Patients: EntrepreneurPulse.com.au  Fact Sheet for Healthcare Providers: IncredibleEmployment.be  This test is not yet approved or cleared by the [place] FDA and  has been authorized for detection and/or diagnosis of SARS-CoV-2 by FDA under an Emergency Use Authorization (EUA).  This EUA  will remain in effect (meaning this test can be used) for the duration of  the COVID-19 declaration under Section 564(b)(1) of the A ct, 21 U.S.C. section 360bbb-3(b)(1), unless the authorization is terminated or revoked sooner.     Influenza A by PCR NEGATIVE NEGATIVE Final   Influenza B by PCR NEGATIVE NEGATIVE Final    Comment: (NOTE) The Xpert Xpress SARS-CoV-2/FLU/RSV plus assay is intended as an aid in the diagnosis of influenza  from Nasopharyngeal swab specimens and should not be used as a sole basis for treatment. Nasal washings and aspirates are unacceptable for Xpert Xpress SARS-CoV-2/FLU/RSV testing.  Fact Sheet for Patients: EntrepreneurPulse.com.au  Fact Sheet for Healthcare Providers: IncredibleEmployment.be  This test is not yet approved or cleared by the [place] FDA and has been authorized for detection and/or diagnosis of SARS-CoV-2 by FDA under an Emergency Use Authorization (EUA). This EUA will remain in effect (meaning this test can be used) for the duration of the COVID-19 declaration under Section 564(b)(1) of the Act, 21 U.S.C. section 360bbb-3(b)(1), unless the authorization is terminated or revoked.  Performed at [facility], [address] [place], [place] [address]   Blood Culture (routine x 2)     Status: None (Preliminary result)   Collection Time: 08/27/22 10:18 AM   Specimen: Right Antecubital; Blood  Result Value Ref Range Status   Specimen Description RIGHT ANTECUBITAL  Final   Special Requests   Final    BOTTLES DRAWN AEROBIC AND ANAEROBIC Blood Culture results may not be optimal due to an excessive volume of blood received in culture bottles   Culture   Final    NO GROWTH 2 DAYS Performed at [facility], [address] [place], [place] [address]    Report Status PENDING  Incomplete  Blood Culture (routine x 2)     Status: None (Preliminary result)   Collection Time: 08/27/22 10:18 AM    Specimen: Left Antecubital; Blood  Result Value Ref Range Status   Specimen Description LEFT ANTECUBITAL  Final   Special Requests   Final    BOTTLES DRAWN AEROBIC AND ANAEROBIC Blood Culture results may not be optimal due to an excessive volume of blood received in culture bottles   Culture   Final    NO GROWTH 2 DAYS Performed at [facility], [address], [place], [place] [address]    Report Status PENDING  Incomplete     Radiology Studies: ECHOCARDIOGRAM COMPLETE  Result Date: 08/28/2022    ECHOCARDIOGRAM REPORT   Patient Name:   DEZMOND DOWNIE Date of Exam: 08/28/2022 Medical Rec #:  [id number]           Height:       71.0 in Accession #:    [id number]          Weight:       218.9 lb Date of Birth:  27-Jan-1956           BSA:          2.191 m Patient Age:    64 years            BP:           136/88 mmHg Patient Gender: M                   HR:           96 bpm. Exam Location:  [facility] Procedure: 2D Echo, Cardiac Doppler, Color Doppler and Intracardiac            Opacification Agent Indications:    Syncope  History:        Patient has prior history of Echocardiogram examinations, most                 recent 05/28/2021. CAD, Prior Cardiac Surgery,                 Signs/Symptoms:Chest Pain; Risk Factors:Hypertension and  Dyslipidemia. COVID +.  Sonographer:    Wenda Low Referring Phys: [place]  1. Left ventricular ejection fraction, by estimation, is 60 to 65%. The left ventricle has normal function. The left ventricle has no regional wall motion abnormalities. There is severe left ventricular hypertrophy. Left ventricular diastolic parameters  are indeterminate.  2. Right ventricular systolic function is normal. The right ventricular size is normal. Tricuspid regurgitation signal is inadequate for assessing PA pressure.  3. A small pericardial effusion is present. The pericardial effusion is circumferential.  4. The mitral valve is normal in  structure. No evidence of mitral valve regurgitation. No evidence of mitral stenosis.  5. The aortic valve is tricuspid. Aortic valve regurgitation is not visualized. No aortic stenosis is present.  6. Aortic dilatation noted. There is mild dilatation of the aortic root, measuring 40 mm. There is mild dilatation of the ascending aorta, measuring 41 mm. FINDINGS  Left Ventricle: Left ventricular ejection fraction, by estimation, is 60 to 65%. The left ventricle has normal function. The left ventricle has no regional wall motion abnormalities. Definity contrast agent was given IV to delineate the left ventricular  endocardial borders. The left ventricular internal cavity size was normal in size. There is severe left ventricular hypertrophy. Left ventricular diastolic parameters are indeterminate. Right Ventricle: The right ventricular size is normal. Right vetricular wall thickness was not well visualized. Right ventricular systolic function is normal. Tricuspid regurgitation signal is inadequate for assessing PA pressure. Left Atrium: Left atrial size was normal in size. Right Atrium: Right atrial size was not well visualized. Pericardium: A small pericardial effusion is present. The pericardial effusion is circumferential. Mitral Valve: The mitral valve is normal in structure. No evidence of mitral valve regurgitation. No evidence of mitral valve stenosis. MV peak gradient, 4.4 mmHg. The mean mitral valve gradient is 2.0 mmHg. Tricuspid Valve: The tricuspid valve is not well visualized. Tricuspid valve regurgitation is trivial. No evidence of tricuspid stenosis. Aortic Valve: The aortic valve is tricuspid. Aortic valve regurgitation is not visualized. No aortic stenosis is present. Aortic valve mean gradient measures 3.0 mmHg. Aortic valve peak gradient measures 5.3 mmHg. Aortic valve area, by VTI measures 2.52 cm. Pulmonic Valve: The pulmonic valve was not well visualized. Pulmonic valve regurgitation is trivial.  No evidence of pulmonic stenosis. Aorta: Aortic dilatation noted. There is mild dilatation of the aortic root, measuring 40 mm. There is mild dilatation of the ascending aorta, measuring 41 mm. IAS/Shunts: No atrial level shunt detected by color flow Doppler.  LEFT VENTRICLE PLAX 2D LVIDd:         6.00 cm LVIDs:         3.90 cm LV PW:         1.40 cm LV IVS:        1.60 cm LVOT diam:     2.00 cm LV SV:         52 LV SV Index:   24 LVOT Area:     3.14 cm  RIGHT VENTRICLE RV Basal diam:  4.05 cm RV Mid diam:    3.60 cm RV S prime:     13.70 cm/s TAPSE (M-mode): 3.0 cm LEFT ATRIUM           Index        RIGHT ATRIUM           Index LA diam:      4.50 cm 2.05 cm/m   RA Area:     25.30 cm  LA Vol (A2C): 83.2 ml 37.97 ml/m  RA Volume:   78.70 ml  35.92 ml/m LA Vol (A4C): 56.8 ml 25.92 ml/m  AORTIC VALVE                    PULMONIC VALVE AV Area (Vmax):    2.66 cm     PV Vmax:       0.93 m/s AV Area (Vmean):   2.67 cm     PV Peak grad:  3.5 mmHg AV Area (VTI):     2.52 cm AV Vmax:           115.50 cm/s AV Vmean:          72.700 cm/s AV VTI:            0.205 m AV Peak Grad:      5.3 mmHg AV Mean Grad:      3.0 mmHg LVOT Vmax:         97.90 cm/s LVOT Vmean:        61.800 cm/s LVOT VTI:          0.164 m LVOT/AV VTI ratio: 0.80  AORTA Ao Root diam: 4.00 cm Ao Asc diam:  4.10 cm MITRAL VALVE MV Area (PHT): 3.37 cm     SHUNTS MV Area VTI:   2.22 cm     Systemic VTI:  0.16 m MV Peak grad:  4.4 mmHg     Systemic Diam: 2.00 cm MV Mean grad:  2.0 mmHg MV Vmax:       1.05 m/s MV Vmean:      56.8 cm/s MV Decel Time: 225 msec MV E velocity: 111.00 cm/s Carlyle Dolly MD Electronically signed by Carlyle Dolly MD Signature Date/Time: 08/28/2022/11:16:57 AM    Final      Scheduled Meds:  vitamin C  500 mg Oral Daily   aspirin EC  81 mg Oral Daily   carvedilol  3.125 mg Oral BID   citalopram  20 mg Oral Daily   clopidogrel  75 mg Oral Daily   heparin  5,000 Units Subcutaneous Q8H   insulin aspart  0-15 Units  Subcutaneous TID WC   insulin aspart  0-5 Units Subcutaneous QHS   Ipratropium-Albuterol  1 puff Inhalation BID   multivitamin with minerals  1 tablet Oral Daily   nirmatrelvir/ritonavir EUA (renal dosing)  2 tablet Oral BID   potassium chloride SA  20 mEq Oral Daily   sodium chloride flush  3 mL Intravenous Q12H   sodium chloride flush  3 mL Intravenous Q12H   zinc sulfate  220 mg Oral Daily   Continuous Infusions:  sodium chloride 125 mL/hr at 08/29/22 1325   sodium chloride     lactated ringers Stopped (08/27/22 2119)     LOS: 1 day    Barton Dubois M.D on 08/29/2022   Go to [email or web address] - for contact info  [facility] - Office  [phone]  If 7PM-7AM, please contact night-coverage [email or web address] 08/29/2022, 5:52 PM

## 2022-08-30 ENCOUNTER — Other Ambulatory Visit: Payer: Self-pay

## 2022-08-30 DIAGNOSIS — U071 COVID-19: Secondary | ICD-10-CM | POA: Diagnosis not present

## 2022-08-30 DIAGNOSIS — W19XXXD Unspecified fall, subsequent encounter: Secondary | ICD-10-CM | POA: Diagnosis not present

## 2022-08-30 DIAGNOSIS — I10 Essential (primary) hypertension: Secondary | ICD-10-CM | POA: Diagnosis not present

## 2022-08-30 DIAGNOSIS — Z955 Presence of coronary angioplasty implant and graft: Secondary | ICD-10-CM | POA: Diagnosis not present

## 2022-08-30 LAB — FERRITIN: Ferritin: 14 ng/mL — ABNORMAL LOW (ref 24–336)

## 2022-08-30 LAB — GLUCOSE, CAPILLARY
Glucose-Capillary: 90 mg/dL (ref 70–99)
Glucose-Capillary: 119 mg/dL — ABNORMAL HIGH (ref 70–99)
Glucose-Capillary: 127 mg/dL — ABNORMAL HIGH (ref 70–99)
Glucose-Capillary: 83 mg/dL (ref 70–99)

## 2022-08-30 LAB — COMPREHENSIVE METABOLIC PANEL
ALT: 18 U/L (ref 0–44)
AST: 27 U/L (ref 15–41)
Albumin: 2.7 g/dL — ABNORMAL LOW (ref 3.5–5.0)
Alkaline Phosphatase: 47 U/L (ref 38–126)
Anion gap: 5 (ref 5–15)
BUN: 23 mg/dL (ref 8–23)
CO2: 21 mmol/L — ABNORMAL LOW (ref 22–32)
Calcium: 8 mg/dL — ABNORMAL LOW (ref 8.9–10.3)
Chloride: 113 mmol/L — ABNORMAL HIGH (ref 98–111)
Creatinine, Ser: 0.99 mg/dL (ref 0.61–1.24)
GFR, Estimated: >60 mL/min (ref >60)
Glucose, Bld: 84 mg/dL (ref 70–99)
Potassium: 3.9 mmol/L (ref 3.5–5.1)
Sodium: 139 mmol/L (ref 135–145)
Total Bilirubin: 0.6 mg/dL (ref 0.3–1.2)
Total Protein: 5.2 g/dL — ABNORMAL LOW (ref 6.5–8.1)

## 2022-08-30 LAB — CBC WITH DIFFERENTIAL/PLATELET
Abs Immature Granulocytes: 0.01 10*3/uL (ref 0.00–0.07)
Basophils Absolute: 0 10*3/uL (ref 0.0–0.1)
Basophils Relative: 0 %
Eosinophils Absolute: 0 10*3/uL (ref 0.0–0.5)
Eosinophils Relative: 1 %
HCT: 32.1 % — ABNORMAL LOW (ref 39.0–52.0)
Hemoglobin: 9.9 g/dL — ABNORMAL LOW (ref 13.0–17.0)
Immature Granulocytes: 0 %
Lymphocytes Relative: 25 %
Lymphs Abs: 0.9 10*3/uL (ref 0.7–4.0)
MCH: 28.1 pg (ref 26.0–34.0)
MCHC: 30.8 g/dL (ref 30.0–36.0)
MCV: 91.2 fL (ref 80.0–100.0)
Monocytes Absolute: 0.3 10*3/uL (ref 0.1–1.0)
Monocytes Relative: 9 %
Neutro Abs: 2.5 10*3/uL (ref 1.7–7.7)
Neutrophils Relative %: 65 %
Platelets: 97 10*3/uL — ABNORMAL LOW (ref 150–400)
RBC: 3.52 MIL/uL — ABNORMAL LOW (ref 4.22–5.81)
RDW: 15.1 % (ref 11.5–15.5)
WBC: 3.8 10*3/uL — ABNORMAL LOW (ref 4.0–10.5)
nRBC: 0 % (ref 0.0–0.2)

## 2022-08-30 LAB — PHOSPHORUS: Phosphorus: 3.7 mg/dL (ref 2.5–4.6)

## 2022-08-30 LAB — C-REACTIVE PROTEIN: CRP: 3.7 mg/dL — ABNORMAL HIGH (ref <1.0)

## 2022-08-30 LAB — MAGNESIUM: Magnesium: 2.1 mg/dL (ref 1.7–2.4)

## 2022-08-30 MED ORDER — IPRATROPIUM-ALBUTEROL 20-100 MCG/ACT IN AERS: 1.0000 | INHALATION_SPRAY | Freq: Four times a day (QID) | RESPIRATORY_TRACT | Status: DC | PRN

## 2022-08-30 NOTE — Progress Notes (Signed)
Physical Therapy Treatment Patient Details Name: Stephen Graves MRN: [id number] DOB: 1955/11/10 Today's Date: 08/30/2022   History of Present Illness Stephen Graves  is a 66 y.o. male  with PMH of CAD s/p DES to mid-distal LAD in 06/2017, chronic chest pain, hypertension, hyperlipidemia, anxiety presents to the ED via EMS after falling twice at home since last night.  Patient was found by his brother around 53 am on 08/27/2022,   -Apparently fell for the second time and laid on his belly could not get up unfortunately his belly was lying against his heater vent---some erythema over the anterior abdomen is noted  -  Patient does not think he lost consciousness  -Denies frank chest pains or palpitations dizziness  -  No Nausea, Vomiting or Diarrhea     -In the ED patient is found to be positive for COVID-19 infection    PT Comments    Patient presents up in chair (assisted by nursing staff) and agreeable for therapy.  Patient has difficulty completing supine to sitting in bed due to weakness, fair/good return for completing BLE exercises while seated in chair with verbal cueing and demonstration, had to use RW for gait training due to fair/poor standing balance and tolerated staying up in chair after therapy.  Patient will benefit from continued skilled physical therapy in hospital and recommended venue below to increase strength, balance, endurance for safe ADLs and gait.     Recommendations for follow up therapy are one component of a multi-disciplinary discharge planning process, led by the attending physician.  Recommendations may be updated based on patient status, additional functional criteria and insurance authorization.  Follow Up Recommendations  Skilled nursing-short term rehab (<3 hours/day) Can patient physically be transported by private vehicle: Yes   Assistance Recommended at Discharge Intermittent Supervision/Assistance  Patient can return home with the following A little  help with walking and/or transfers;A little help with bathing/dressing/bathroom;Help with stairs or ramp for entrance;Assistance with Emergency planning/management officer    Recommendations for Other Services       Precautions / Restrictions Precautions Precautions: Fall Restrictions Weight Bearing Restrictions: No     Mobility  Bed Mobility Overal bed mobility: Needs Assistance Bed Mobility: Supine to Sit, Sit to Supine     Supine to sit: Min assist Sit to supine: Supervision, Min guard   General bed mobility comments: slightly labored movement, increased time, had diffiuclty completing supine to sitting due to weakness    Transfers Overall transfer level: Needs assistance Equipment used: Rolling walker (2 wheels) Transfers: Sit to/from Stand, Bed to chair/wheelchair/BSC Sit to Stand: Min guard   Step pivot transfers: Min guard       General transfer comment: increased time, labored movement    Ambulation/Gait Ambulation/Gait assistance: Min guard, Min assist Gait Distance (Feet): 35 Feet Assistive device: Rolling walker (2 wheels) Gait Pattern/deviations: Decreased step length - right, Decreased step length - left, Decreased stride length, Trunk flexed Gait velocity: decreased     General Gait Details: had to use RW for safety demonstrating slow labored cadence without loss of balance and limited mostly due to c/o fatigue   Stairs             Wheelchair Mobility    Modified Rankin (Stroke Patients Only)       Balance Overall balance assessment: Needs assistance Sitting-balance support: Feet supported, No upper extremity supported Sitting balance-Leahy Scale: Fair Sitting balance - Comments: fair/good seated at EOB   Standing balance  support: During functional activity, Bilateral upper extremity supported Standing balance-Leahy Scale: Fair Standing balance comment: using RW                            Cognition  Arousal/Alertness: Awake/alert Behavior During Therapy: WFL for tasks assessed/performed Overall Cognitive Status: Within Functional Limits for tasks assessed                                          Exercises General Exercises - Lower Extremity Long Arc Quad: Seated, AROM, Strengthening, Both, 10 reps Hip Flexion/Marching: Seated, AROM, Strengthening, Both, 10 reps Toe Raises: Seated, AROM, Strengthening, Both, 10 reps Heel Raises: Seated, AROM, Strengthening, Both, 10 reps    General Comments        Pertinent Vitals/Pain Pain Assessment Pain Assessment: No/denies pain    Home Living                          Prior Function            PT Goals (current goals can now be found in the care plan section) Acute Rehab PT Goals Patient Stated Goal: return home after rehab PT Goal Formulation: With patient Time For Goal Achievement: 09/11/22 Potential to Achieve Goals: Good Progress towards PT goals: Progressing toward goals    Frequency    Min 3X/week      PT Plan Current plan remains appropriate    Co-evaluation              AM-PAC PT "6 Clicks" Mobility   Outcome Measure  Help needed turning from your back to your side while in a flat bed without using bedrails?: A Little Help needed moving from lying on your back to sitting on the side of a flat bed without using bedrails?: A Little Help needed moving to and from a bed to a chair (including a wheelchair)?: A Little Help needed standing up from a chair using your arms (e.g., wheelchair or bedside chair)?: A Little Help needed to walk in hospital room?: A Little Help needed climbing 3-5 steps with a railing? : A Lot 6 Click Score: 17    End of Session   Activity Tolerance: Patient tolerated treatment well;Patient limited by fatigue Patient left: in chair;with call bell/phone within reach;with chair alarm set Nurse Communication: Mobility status PT Visit Diagnosis: Unsteadiness  on feet (R26.81);Other abnormalities of gait and mobility (R26.89);Muscle weakness (generalized) (M62.81)     Time: 4540-9811 PT Time Calculation (min) (ACUTE ONLY): 22 min  Charges:  $Therapeutic Exercise: 8-22 mins $Therapeutic Activity: 8-22 mins                     11:46 AM, 08/30/22 Lonell Grandchild, MPT Physical Therapist with [facility] [phone] office [phone] mobile phone

## 2022-08-30 NOTE — Progress Notes (Signed)
Called into room as patient states he was still waiting to see if his purewick was leaking, checked and patient was dry. Cannister with 676m's, emptied and documented. Checked CBG with a reading of 83, provided peanut butter crackers and diet coke as requested. Will continue to monitor.

## 2022-08-30 NOTE — TOC Progression Note (Signed)
Transition of Care [facility]) - Progression Note    Patient Details  Name: [name] MRN: [id number] Date of Birth: 08-02-1956  Transition of Care [facility] CM/SW Contact  Ihor Gully, LCSW Phone Number: 08/30/2022, 12:02 PM  Clinical Narrative:    SNF auth started in preparation for admission to [facility] on 12/10.   Expected Discharge Plan: [place] Barriers to Discharge: Continued Medical Work up  Expected Discharge Plan and Services Expected Discharge Plan: [place] arrangements for the past 2 months: Mobile Home                                       Social Determinants of Health (SDOH) Interventions    Readmission Risk Interventions     No data to display

## 2022-08-30 NOTE — Progress Notes (Signed)
PROGRESS NOTE     Stephen Graves, is a 66 y.o. male, DOB - 13-Apr-1956, GYK:[id number]  Admit date - 08/27/2022   Admitting Physician Courage Denton Brick, MD  Outpatient Primary MD for the patient is [facility]  LOS - 2  Chief Complaint  Patient presents with   Fall        Brief Narrative:   66 y.o. male  with PMH of CAD s/p DES to mid-distal LAD in 06/2017, chronic chest pain, hypertension, hyperlipidemia, anxiety admitted from home on 08/27/2022 after recurrent falls with right-sided rib fractures and elevated CKs in the setting of COVID-19 infection    -Assessment and Plan: 1)S/p Fall with right anterior fifth rib fracture and elevated CPK/dehydration/traumatic rhabdomyolysis: -CK 2,783 >>> 2,743>>1197 -Lactic acid 2.5 repeat 2.1 -Patient denies chest pains or syncope -Troponin 48 >> 41 -EKG is not acute -D-dimer is elevated however VQ scan is unremarkable --T. bili 4.5 AST of 51 otherwise LFTs WNL -Chest x-ray without acute findings -CT head and brain MRI without acute findings CT C-spine without acute findings CT abdomen pelvis without acute findings -CT chest with acute nondisplaced fracture of the right anterior fifth rib -Continue to maintain adequate hydration.  -Continue to hold Crestor due to elevated CPKs -Physical therapist recommends SNF rehab   2) H/o chronic anemia---- hemoglobin 13.2 which is higher than baseline suspect some degree of hemoconcentration- --anticipate that hemoglobin will drop with hydration/hemodilution -on 08/28/2022--Hgb is now back to baseline range currently 10.8 after hydration -No overt bleeding appreciated.   3) mild thrombocytopenia --not new monitor closely,  no bleeding concerns at this time   4)AKI--in retrospect the patient does not have CKD most likely has just AKI -Admission creatinine 1.46 -With hydration creatinine is down to 0.99 with GFR greater than 60 -- Continue to renally adjust medications,  avoid nephrotoxic agents / dehydration  / hypotension. -Patient advised to maintain adequate hydration.   5)COVID-19 infection --no significant hypoxia -Continue treatment empirically with Paxlovid (day 3/5) -No Covid pneumonia on chest imaging studies hold off on steroids --Check and trend inflammatory markers including D-dimer, ferritin and  CRP---also follow CBC and CMP --Supplemental oxygen to keep O2 sats above 93% ---- Encourage prone positioning  if able to tolerate --Attempt to maintain euvolemic state --Zinc and vitamin C as ordered -Albuterol inhaler as needed -Accu-Cheks/fingersticks while on high-dose steroids   6)Hx of CAD s/p stent to LAD: Patient had LHC in September 2023  CTO treated with successful PCI/DES overlapping previously placed stent. Planned for DAPT with ASA/Brilinta for at least one year, likely lifelong given extensive stenting in the LAD. No recurrent chest pain. Seen by CR.  -- continue on ASA, plavix, Coreg 6.'25mg'$  BID,  -Prior echo showed  EF 60-65% with no rWMA noted, severe biatrial enlargement -Repeat echo from 08/28/2022 with EF of 60 to 65%, no regional wall motion abnormalities, no aortic stenosis no mitral stenosis -Continue to hold Crestor due to elevated CPKs -Continue to hold losartan due to concerns about risk of AKI on CKD given dehydration   7)DM2- Hgb A1c 6.4 reflecting fair diabetic control PTA -Hold metformin Use Novolog/Humalog Sliding scale insulin with Accu-Cheks/Fingersticks as ordered    8)Anxiety: c/n  Celexa  9)Disposition/Recurrent falls---PTA pt lived alone and did very poorly, patient has significant limitations with mobility related ADLs- this patient needs to continue to be monitored in the hospital until a SNF bed is obtained as she is not safe to go home with her current physcical limitations -  Physical therapy evaluation appreciated recommends SNF rehab   Dispo: The patient is from: Home              Anticipated d/c is to:  SNF              Anticipated d/c to skilled nursing facility once bed available/insurance authorization clear.              Patient currently is medically stable to d/c.  Barriers: Not Clinically Stable-   Code Status : -  Code Status: Full Code   Family Communication:   NA (patient is alert, awake and coherent)  DVT Prophylaxis  :   - SCDs   heparin injection 5,000 Units Start: 08/27/22 2200 SCDs Start: 08/27/22 1607 Place TED hose Start: 08/27/22 1607   Lab Results  Component Value Date   PLT 97 (L) 08/30/2022    Inpatient Medications  Scheduled Meds:  vitamin C  500 mg Oral Daily   aspirin EC  81 mg Oral Daily   carvedilol  3.125 mg Oral BID   citalopram  20 mg Oral Daily   clopidogrel  75 mg Oral Daily   heparin  5,000 Units Subcutaneous Q8H   insulin aspart  0-15 Units Subcutaneous TID WC   insulin aspart  0-5 Units Subcutaneous QHS   multivitamin with minerals  1 tablet Oral Daily   nirmatrelvir/ritonavir EUA (renal dosing)  2 tablet Oral BID   potassium chloride SA  20 mEq Oral Daily   sodium chloride flush  3 mL Intravenous Q12H   sodium chloride flush  3 mL Intravenous Q12H   zinc sulfate  220 mg Oral Daily   Continuous Infusions:  sodium chloride 125 mL/hr at 08/30/22 1655   sodium chloride     lactated ringers Stopped (08/27/22 2119)   PRN Meds:.sodium chloride, acetaminophen **OR** acetaminophen, bisacodyl, chlorpheniramine-HYDROcodone, guaiFENesin-dextromethorphan, hydrALAZINE, Ipratropium-Albuterol, ondansetron **OR** ondansetron (ZOFRAN) IV, oxyCODONE, polyethylene glycol, sodium chloride flush, traZODone   Anti-infectives (From admission, onward)    Start     Dose/Rate Route Frequency Ordered Stop   08/27/22 1500  nirmatrelvir/ritonavir EUA (renal dosing) (PAXLOVID) 2 tablet        2 tablet Oral 2 times daily 08/27/22 1419 09/01/22 0959        Subjective: Stephen Graves feeling weak, tired and deconditioned.  Expresses overall feeling fatigue  and requiring assistance due to feeling unsafe on his balance.  Hemodynamically stable, afebrile and demonstrating good oxygen saturation on room air.   Objective: Vitals:   08/29/22 2248 08/30/22 0554 08/30/22 0808 08/30/22 1254  BP: (!) 156/106 (!) 171/106  (!) 187/122  Pulse: 81 69 81 94  Resp: '20 19 18   '$ Temp: 98.1 F (36.7 C) 98 F (36.7 C)  97.7 F (36.5 C)  TempSrc: Oral   Oral  SpO2: 99% 100% 98% 98%  Weight:      Height:        Intake/Output Summary (Last 24 hours) at 08/30/2022 1754 Last data filed at 08/30/2022 1655 Gross per 24 hour  Intake 7568.86 ml  Output 650 ml  Net 6918.86 ml   Filed Weights   08/27/22 0919 08/28/22 0023  Weight: 97.1 kg 99.3 kg    Physical Exam General exam: Alert, awake, oriented x 3; in no acute distress.  Patient denies chest pain and expressed no shortness of breath.  Good saturation on room air. Respiratory system: Improved air movement bilaterally; no using accessory muscle.  Good saturation on room air. Cardiovascular system:RRR.  No rubs or gallops; no JVD appreciated on exam. Gastrointestinal system: Abdomen is nondistended, soft and nontender. No organomegaly or masses felt. Normal bowel sounds heard. Central nervous system: Alert and oriented. No focal neurological deficits. Extremities: No cyanosis or clubbing. Skin: No petechiae. Psychiatry: Judgement and insight appear normal. Mood & affect appropriate.   Data Reviewed: I have personally reviewed following labs and imaging studies  CBC: Recent Labs  Lab 08/27/22 0915 08/28/22 0535 08/29/22 0419 08/30/22 0532  WBC 9.7 6.2 3.7* 3.8*  NEUTROABS 8.6* 4.6 2.5 2.5  HGB 13.2 10.8* 10.1* 9.9*  HCT 41.7 34.1* 32.7* 32.1*  MCV 88.7 89.3 90.1 91.2  PLT 144* 110* 95* 97*   Basic Metabolic Panel: Recent Labs  Lab 08/27/22 0915 08/28/22 0535 08/29/22 0419 08/30/22 0532  NA 136 137 139 139  K 4.0 3.7 3.6 3.9  CL 103 107 110 113*  CO2 22 22 21* 21*  GLUCOSE 174* 101*  93 84  BUN 35* 32* 28* 23  CREATININE 1.46* 1.08 1.09 0.99  CALCIUM 9.1 8.4* 8.0* 8.0*  MG 2.1 2.1 2.2 2.1  PHOS  --  3.3 3.1 3.7   GFR: Estimated Creatinine Clearance: 85.8 mL/min (by C-G formula based on SCr of 0.99 mg/dL).  Liver Function Tests: Recent Labs  Lab 08/27/22 0915 08/28/22 0535 08/29/22 0419 08/30/22 0532  AST 51* 44* 37 27  ALT '21 18 18 18  '$ ALKPHOS 68 49 49 47  BILITOT 1.5* 1.2 0.9 0.6  PROT 6.9 5.7* 5.3* 5.2*  ALBUMIN 3.8 3.0* 2.7* 2.7*   Cardiac Enzymes: Recent Labs  Lab 08/27/22 0915 08/27/22 1156 08/29/22 0419  CKTOTAL 2,783* 2,743* 1,197*   Recent Results (from the past 240 hour(s))  Urine Culture     Status: None   Collection Time: 08/27/22  9:32 AM   Specimen: Urine, Catheterized  Result Value Ref Range Status   Specimen Description   Final    URINE, CATHETERIZED Performed at [facility], [address], [place], [place] [address]    Special Requests   Final    NONE Performed at [facility], [address] [place], [place] [address]    Culture   Final    NO GROWTH Performed at [facility], [place] [address], [place], [place] [address]    Report Status 08/28/2022 FINAL  Final  Resp Panel by RT-PCR (Flu A&B, Covid) Anterior Nasal Swab     Status: Abnormal   Collection Time: 08/27/22  9:33 AM   Specimen: Anterior Nasal Swab  Result Value Ref Range Status   SARS Coronavirus 2 by RT PCR POSITIVE (A) NEGATIVE Final    Comment: (NOTE) SARS-CoV-2 target nucleic acids are DETECTED.  The SARS-CoV-2 RNA is generally detectable in upper respiratory specimens during the acute phase of infection. Positive results are indicative of the presence of the identified virus, but do not rule out bacterial infection or co-infection with other pathogens not detected by the test. Clinical correlation with patient history and other diagnostic information is necessary to determine patient infection status. The expected result is Negative.  Fact Sheet  for Patients: EntrepreneurPulse.com.au  Fact Sheet for Healthcare Providers: IncredibleEmployment.be  This test is not yet approved or cleared by the [place] FDA and  has been authorized for detection and/or diagnosis of SARS-CoV-2 by FDA under an Emergency Use Authorization (EUA).  This EUA will remain in effect (meaning this test can be used) for the duration of  the COVID-19 declaration under Section 564(b)(1) of  the A ct, 21 U.S.C. section 360bbb-3(b)(1), unless the authorization is terminated or revoked sooner.     Influenza A by PCR NEGATIVE NEGATIVE Final   Influenza B by PCR NEGATIVE NEGATIVE Final    Comment: (NOTE) The Xpert Xpress SARS-CoV-2/FLU/RSV plus assay is intended as an aid in the diagnosis of influenza from Nasopharyngeal swab specimens and should not be used as a sole basis for treatment. Nasal washings and aspirates are unacceptable for Xpert Xpress SARS-CoV-2/FLU/RSV testing.  Fact Sheet for Patients: EntrepreneurPulse.com.au  Fact Sheet for Healthcare Providers: IncredibleEmployment.be  This test is not yet approved or cleared by the [place] FDA and has been authorized for detection and/or diagnosis of SARS-CoV-2 by FDA under an Emergency Use Authorization (EUA). This EUA will remain in effect (meaning this test can be used) for the duration of the COVID-19 declaration under Section 564(b)(1) of the Act, 21 U.S.C. section 360bbb-3(b)(1), unless the authorization is terminated or revoked.  Performed at [facility], [address] [place], [place] [address]   Blood Culture (routine x 2)     Status: None (Preliminary result)   Collection Time: 08/27/22 10:18 AM   Specimen: Right Antecubital; Blood  Result Value Ref Range Status   Specimen Description RIGHT ANTECUBITAL  Final   Special Requests   Final    BOTTLES DRAWN AEROBIC AND ANAEROBIC Blood Culture results may not  be optimal due to an excessive volume of blood received in culture bottles   Culture   Final    NO GROWTH 3 DAYS Performed at [facility], [address], [place], [place] [address]    Report Status PENDING  Incomplete  Blood Culture (routine x 2)     Status: None (Preliminary result)   Collection Time: 08/27/22 10:18 AM   Specimen: Left Antecubital; Blood  Result Value Ref Range Status   Specimen Description LEFT ANTECUBITAL  Final   Special Requests   Final    BOTTLES DRAWN AEROBIC AND ANAEROBIC Blood Culture results may not be optimal due to an excessive volume of blood received in culture bottles   Culture   Final    NO GROWTH 3 DAYS Performed at [facility], [address], [place], [place] [address]    Report Status PENDING  Incomplete     Radiology Studies: No results found.   Scheduled Meds:  vitamin C  500 mg Oral Daily   aspirin EC  81 mg Oral Daily   carvedilol  3.125 mg Oral BID   citalopram  20 mg Oral Daily   clopidogrel  75 mg Oral Daily   heparin  5,000 Units Subcutaneous Q8H   insulin aspart  0-15 Units Subcutaneous TID WC   insulin aspart  0-5 Units Subcutaneous QHS   multivitamin with minerals  1 tablet Oral Daily   nirmatrelvir/ritonavir EUA (renal dosing)  2 tablet Oral BID   potassium chloride SA  20 mEq Oral Daily   sodium chloride flush  3 mL Intravenous Q12H   sodium chloride flush  3 mL Intravenous Q12H   zinc sulfate  220 mg Oral Daily   Continuous Infusions:  sodium chloride 125 mL/hr at 08/30/22 1655   sodium chloride     lactated ringers Stopped (08/27/22 2119)     LOS: 2 days    Barton Dubois M.D on 08/30/2022   Go to [email or web address] - for contact info  [facility] - Office  [phone]  If 7PM-7AM, please contact night-coverage [email or web address] 08/30/2022, 5:54 PM

## 2022-08-30 NOTE — Progress Notes (Signed)
Patient alert and oriented. He has requested several times this shift to move back and forth between the bed and the chair and to get up and walk around in the room as a stand by assist with no issues.

## 2022-08-30 NOTE — TOC Progression Note (Signed)
Transition of Care [facility]) - Progression Note    Patient Details  Name: Stephen Graves MRN: [id number] Date of Birth: 1956-01-24  Transition of Care [facility] CM/SW Contact  Ihor Gully, LCSW Phone Number: 08/30/2022, 3:06 PM  Clinical Narrative:    Patient's authorization is approved.    Expected Discharge Plan: [place] Barriers to Discharge: Continued Medical Work up  Expected Discharge Plan and Services Expected Discharge Plan: [place] arrangements for the past 2 months: Mobile Home                                       Social Determinants of Health (SDOH) Interventions    Readmission Risk Interventions     No data to display

## 2022-08-31 DIAGNOSIS — U071 COVID-19: Secondary | ICD-10-CM | POA: Diagnosis not present

## 2022-08-31 DIAGNOSIS — Z955 Presence of coronary angioplasty implant and graft: Secondary | ICD-10-CM | POA: Diagnosis not present

## 2022-08-31 DIAGNOSIS — I10 Essential (primary) hypertension: Secondary | ICD-10-CM | POA: Diagnosis not present

## 2022-08-31 DIAGNOSIS — W19XXXD Unspecified fall, subsequent encounter: Secondary | ICD-10-CM | POA: Diagnosis not present

## 2022-08-31 LAB — GLUCOSE, CAPILLARY
Glucose-Capillary: 101 mg/dL — ABNORMAL HIGH (ref 70–99)
Glucose-Capillary: 114 mg/dL — ABNORMAL HIGH (ref 70–99)
Glucose-Capillary: 123 mg/dL — ABNORMAL HIGH (ref 70–99)
Glucose-Capillary: 116 mg/dL — ABNORMAL HIGH (ref 70–99)

## 2022-08-31 MED ORDER — TEMAZEPAM 7.5 MG PO CAPS: 7.5000 mg | ORAL_CAPSULE | Freq: Every evening | ORAL | Status: DC | PRN

## 2022-08-31 NOTE — Progress Notes (Signed)
Physical Therapy Treatment Patient Details Name: Stephen Graves MRN: [id number] DOB: [birth year] Today's Date: 08/31/2022   History of Present Illness Stephen Graves  is a 66 y.o. male  with PMH of CAD s/p DES to mid-distal LAD in 06/2017, chronic chest pain, hypertension, hyperlipidemia, anxiety presents to the ED via EMS after falling twice at home since last night.  Patient was found by his brother around 6 am on 08/27/2022,   -Apparently fell for the second time and laid on his belly could not get up unfortunately his belly was lying against his heater vent---some erythema over the anterior abdomen is noted  -  Patient does not think he lost consciousness  -Denies frank chest pains or palpitations dizziness  -  No Nausea, Vomiting or Diarrhea     -In the ED patient is found to be positive for COVID-19 infection    PT Comments    Pt tolerated today's treatment session, unable to progress upright standing activity, with multiple STS with marching in between repetitions.  Patient ambulated another 35 feet with RW at min guard.. Today's session addressed progressing functional activity tolerance with functional transfers and ambulatory activities. Pt noted with continued unsteadiness with upright mobility, especially with no AD support.  Patient continuing to require verbal cueing and tactile cueing occasionally for safe movement patterns. Pt would continue to benefit from skilled acute physical therapy services in order to progress toward POC goals, safety/independence with functional mobility and QOL.     Recommendations for follow up therapy are one component of a multi-disciplinary discharge planning process, led by the attending physician.  Recommendations may be updated based on patient status, additional functional criteria and insurance authorization.  Follow Up Recommendations  Skilled nursing-short term rehab (<3 hours/day) Can patient physically be transported by private vehicle:  Yes   Assistance Recommended at Discharge Intermittent Supervision/Assistance  Patient can return home with the following     Equipment Recommendations  Cane    Recommendations for Other Services       Precautions / Restrictions Precautions Precautions: Fall Precaution Comments: [id number]; Airborne and droplet precaution Restrictions Weight Bearing Restrictions: No     Mobility  Bed Mobility                 Patient Response: Cooperative, Flat affect  Transfers Overall transfer level: Needs assistance Equipment used: Rolling walker (2 wheels) Transfers: Sit to/from Stand Sit to Stand: Min guard           General transfer comment: Increased time, BUE power from recliner rest 8 STS to RW, patient needing consistent verbal cueing for power to stand with BUEs to guard.  Noticed 1 small L OB self-directed.  In standing, patient with performed 30 seconds marching.  Educated on purpose STS and benefits from extended activity..    Ambulation/Gait Ambulation/Gait assistance: Min guard, Min assist Gait Distance (Feet): 40 Feet Assistive device: Rolling walker (2 wheels) Gait Pattern/deviations: Decreased step length - right, Decreased step length - left, Decreased stride length, Trunk flexed Gait velocity: decreased     General Gait Details: had to use RW for safety demonstrating slow labored cadence without loss of balance and limited mostly due to c/o fatigue.  Minimal arm swing, anterior trunk posture, slow cadence and reduced step length shuffling like pattern.  Patient educated on upright posture while ambulating with forward to improve visual gaze.  Patient reporting 3/10 RPE.   Stairs             Wheelchair  Mobility    Modified Rankin (Stroke Patients Only)       Balance Overall balance assessment: Needs assistance Sitting-balance support: Feet supported, No upper extremity supported Sitting balance-Leahy Scale: Fair Sitting balance - Comments:  fair/good seated at EOB.  Delayed postural anticipatory reactions in sitting   Standing balance support: During functional activity, Bilateral upper extremity supported Standing balance-Leahy Scale: Fair Standing balance comment: using RW.  Postural and anticipatory reactions and standing with BUE support on RW.             High level balance activites: Backward walking High Level Balance Comments: Backward walking with patient attempted however unable to play due to safety concerns.            Cognition Arousal/Alertness: Awake/alert Behavior During Therapy: WFL for tasks assessed/performed Overall Cognitive Status: Within Functional Limits for tasks assessed                                          Exercises      General Comments        Pertinent Vitals/Pain Pain Assessment Pain Assessment: No/denies pain    Home Living                          Prior Function            PT Goals (current goals can now be found in the care plan section) Acute Rehab PT Goals Patient Stated Goal: return home after rehab PT Goal Formulation: With patient Time For Goal Achievement: 09/11/22 Potential to Achieve Goals: Good    Frequency    Min 3X/week      PT Plan Current plan remains appropriate    Co-evaluation              AM-PAC PT "6 Clicks" Mobility   Outcome Measure  Help needed turning from your back to your side while in a flat bed without using bedrails?: A Little Help needed moving from lying on your back to sitting on the side of a flat bed without using bedrails?: A Little Help needed moving to and from a bed to a chair (including a wheelchair)?: A Little Help needed standing up from a chair using your arms (e.g., wheelchair or bedside chair)?: A Little Help needed to walk in hospital room?: A Little Help needed climbing 3-5 steps with a railing? : A Lot 6 Click Score: 17    End of Session   Activity Tolerance:  Patient tolerated treatment well;Patient limited by fatigue Patient left: in chair;with call bell/phone within reach;with chair alarm set Nurse Communication: Mobility status PT Visit Diagnosis: Unsteadiness on feet (R26.81);Other abnormalities of gait and mobility (R26.89);Muscle weakness (generalized) (M62.81)     Time: 9983-3825 PT Time Calculation (min) (ACUTE ONLY): 22 min  Charges:  $Therapeutic Activity: 8-22 mins                     Wonda Olds PT, DPT Physical Therapist with [facility] office    Wonda Olds 08/31/2022, 12:31 PM

## 2022-08-31 NOTE — TOC Progression Note (Signed)
Transition of Care [facility]) - Progression Note    Patient Details  Name: Stephen Graves MRN: [id number] Date of Birth: 1956-04-10  Transition of Care [facility] CM/SW [place], [place] Phone Number: 08/31/2022, 12:31 PM  Clinical Narrative:    Pts insurance Josem Kaufmann is approved for SNF at [facility]. TOC confirmed pts Josem Kaufmann is approved through 12/14. TOC to follow.  Expected Discharge Plan: [place] Barriers to Discharge: Continued Medical Work up  Expected Discharge Plan and Services Expected Discharge Plan: [place] arrangements for the past 2 months: Mobile Home                                       Social Determinants of Health (SDOH) Interventions    Readmission Risk Interventions     No data to display

## 2022-08-31 NOTE — Progress Notes (Signed)
PROGRESS NOTE     Stephen Graves, is a 66 y.o. male, DOB - [birth year], OZD:[id number]  Admit date - 08/27/2022   Admitting Physician Courage Denton Brick, MD  Outpatient Primary MD for the patient is [facility]  LOS - 3  Chief Complaint  Patient presents with   Fall        Brief Narrative:   66 y.o. male  with PMH of CAD s/p DES to mid-distal LAD in 06/2017, chronic chest pain, hypertension, hyperlipidemia, anxiety admitted from home on 08/27/2022 after recurrent falls with right-sided rib fractures and elevated CKs in the setting of COVID-19 infection    -Assessment and Plan: 1)S/p Fall with right anterior fifth rib fracture and elevated CPK/dehydration/traumatic rhabdomyolysis: -CK 2,783 >>> 2,743>>1197 -Lactic acid 2.5 repeat 2.1 -Patient denies chest pains or syncope -Troponin 48 >> 41 -EKG is not acute -D-dimer is elevated however VQ scan is unremarkable --T. bili 4.5 AST of 51 otherwise LFTs WNL -Chest x-ray without acute findings -CT head and brain MRI without acute findings CT C-spine without acute findings CT abdomen pelvis without acute findings -CT chest with acute nondisplaced fracture of the right anterior fifth rib -Continue to maintain adequate hydration.  -Continue to hold Crestor due to elevated CPKs -Physical therapist recommends SNF rehab   2) H/o chronic anemia---- hemoglobin 13.2 which is higher than baseline suspect some degree of hemoconcentration- --anticipate that hemoglobin will drop with hydration/hemodilution -on 08/28/2022--Hgb is now back to baseline range currently 10.8 after hydration -No overt bleeding appreciated.   3) mild thrombocytopenia --not new monitor closely,  no bleeding concerns at this time   4)AKI--in retrospect the patient does not have CKD most likely has just AKI -Admission creatinine 1.46 -With hydration creatinine is down to 0.99 with GFR greater than 60 -- Continue to renally adjust medications,  avoid nephrotoxic agents / dehydration  / hypotension. -Patient advised to maintain adequate hydration.   5)COVID-19 infection --no significant hypoxia -Continue treatment empirically with Paxlovid (day 4/5) -No Covid pneumonia on chest imaging studies hold off on steroids --Check and trend inflammatory markers including D-dimer, ferritin and  CRP---also follow CBC and CMP --Supplemental oxygen to keep O2 sats above 93% ---- Encourage prone positioning  if able to tolerate --Attempt to maintain euvolemic state --Zinc and vitamin C as ordered -Albuterol inhaler as needed -Accu-Cheks/fingersticks while on high-dose steroids   6)Hx of CAD s/p stent to LAD: Patient had LHC in September 2023  CTO treated with successful PCI/DES overlapping previously placed stent. Planned for DAPT with ASA/Brilinta for at least one year, likely lifelong given extensive stenting in the LAD. No recurrent chest pain. Seen by CR.  -- continue on ASA, plavix, Coreg 6.'25mg'$  BID,  -Prior echo showed  EF 60-65% with no rWMA noted, severe biatrial enlargement -Repeat echo from 08/28/2022 with EF of 60 to 65%, no regional wall motion abnormalities, no aortic stenosis no mitral stenosis -Continue to hold Crestor due to elevated CPKs -Continue to hold losartan due to concerns about risk of AKI on CKD given dehydration   7)DM2- Hgb A1c 6.4 reflecting fair diabetic control PTA -Hold metformin Use Novolog/Humalog Sliding scale insulin with Accu-Cheks/Fingersticks as ordered    8)Anxiety: c/n  Celexa  9)Disposition/Recurrent falls---PTA pt lived alone and did very poorly, patient has significant limitations with mobility related ADLs- this patient needs to continue to be monitored in the hospital until a SNF bed is obtained as she is not safe to go home with her current physcical limitations -  Physical therapy evaluation appreciated recommends SNF rehab   Dispo: The patient is from: Home              Anticipated d/c is to:  SNF              Anticipated d/c to skilled nursing facility once bed available/insurance authorization clear.              Patient currently is medically stable to d/c.  Barriers: Not Clinically Stable-   Code Status : -  Code Status: Full Code   Family Communication:   NA (patient is alert, awake and coherent)  DVT Prophylaxis  :   - SCDs   heparin injection 5,000 Units Start: 08/27/22 2200 SCDs Start: 08/27/22 1607 Place TED hose Start: 08/27/22 1607   Lab Results  Component Value Date   PLT 97 (L) 08/30/2022    Inpatient Medications  Scheduled Meds:  vitamin C  500 mg Oral Daily   aspirin EC  81 mg Oral Daily   carvedilol  3.125 mg Oral BID   citalopram  20 mg Oral Daily   clopidogrel  75 mg Oral Daily   heparin  5,000 Units Subcutaneous Q8H   insulin aspart  0-15 Units Subcutaneous TID WC   insulin aspart  0-5 Units Subcutaneous QHS   multivitamin with minerals  1 tablet Oral Daily   nirmatrelvir/ritonavir EUA (renal dosing)  2 tablet Oral BID   potassium chloride SA  20 mEq Oral Daily   sodium chloride flush  3 mL Intravenous Q12H   sodium chloride flush  3 mL Intravenous Q12H   zinc sulfate  220 mg Oral Daily   Continuous Infusions:  sodium chloride 125 mL/hr at 08/31/22 0608   sodium chloride     lactated ringers Stopped (08/27/22 2119)   PRN Meds:.sodium chloride, acetaminophen **OR** acetaminophen, bisacodyl, chlorpheniramine-HYDROcodone, guaiFENesin-dextromethorphan, hydrALAZINE, Ipratropium-Albuterol, ondansetron **OR** ondansetron (ZOFRAN) IV, oxyCODONE, polyethylene glycol, sodium chloride flush, temazepam, traZODone   Anti-infectives (From admission, onward)    Start     Dose/Rate Route Frequency Ordered Stop   08/27/22 1500  nirmatrelvir/ritonavir EUA (renal dosing) (PAXLOVID) 2 tablet        2 tablet Oral 2 times daily 08/27/22 1419 09/01/22 0959        Subjective: Stephen Graves expressing fatigue, weakness and tiredness; no fever, no chest  pain, no shortness of breath.  Good saturation on room air.  Hemodynamically stable.  Objective: Vitals:   08/30/22 2005 08/30/22 2029 08/31/22 0346 08/31/22 0603  BP: (!) 190/116 (!) 171/112 (!) 182/124 (!) 156/101  Pulse: 86 85 81 85  Resp: 20  20   Temp: 97.9 F (36.6 C)  98.2 F (36.8 C)   TempSrc: Oral  Oral   SpO2: 100% 100% 100%   Weight:      Height:        Intake/Output Summary (Last 24 hours) at 08/31/2022 1607 Last data filed at 08/31/2022 1100 Gross per 24 hour  Intake 2375.95 ml  Output 3650 ml  Net -1274.05 ml   Filed Weights   08/27/22 0919 08/28/22 0023  Weight: 97.1 kg 99.3 kg    Physical Exam General exam: Alert, awake, oriented x 3; following commands appropriately.  In no acute complaints.  Reports feeling the need of increasing physical activity but feels weak and deconditioned.  Expressing insomnia Respiratory system: Clear to auscultation. Respiratory effort normal.  Good saturation on room air; no using accessory muscles. Cardiovascular system:RRR. No rubs or gallops; no  JVD. Gastrointestinal system: Abdomen is nondistended, soft and nontender. No organomegaly or masses felt. Normal bowel sounds heard. Central nervous system: Alert and oriented. No focal neurological deficits. Extremities: No cyanosis or clubbing. Skin: No petechiae. Psychiatry: Judgement and insight appear normal. Mood & affect appropriate.   Data Reviewed: I have personally reviewed following labs and imaging studies  CBC: Recent Labs  Lab 08/27/22 0915 08/28/22 0535 08/29/22 0419 08/30/22 0532  WBC 9.7 6.2 3.7* 3.8*  NEUTROABS 8.6* 4.6 2.5 2.5  HGB 13.2 10.8* 10.1* 9.9*  HCT 41.7 34.1* 32.7* 32.1*  MCV 88.7 89.3 90.1 91.2  PLT 144* 110* 95* 97*   Basic Metabolic Panel: Recent Labs  Lab 08/27/22 0915 08/28/22 0535 08/29/22 0419 08/30/22 0532  NA 136 137 139 139  K 4.0 3.7 3.6 3.9  CL 103 107 110 113*  CO2 22 22 21* 21*  GLUCOSE 174* 101* 93 84  BUN 35* 32* 28*  23  CREATININE 1.46* 1.08 1.09 0.99  CALCIUM 9.1 8.4* 8.0* 8.0*  MG 2.1 2.1 2.2 2.1  PHOS  --  3.3 3.1 3.7   GFR: Estimated Creatinine Clearance: 85.8 mL/min (by C-G formula based on SCr of 0.99 mg/dL).  Liver Function Tests: Recent Labs  Lab 08/27/22 0915 08/28/22 0535 08/29/22 0419 08/30/22 0532  AST 51* 44* 37 27  ALT '21 18 18 18  '$ ALKPHOS 68 49 49 47  BILITOT 1.5* 1.2 0.9 0.6  PROT 6.9 5.7* 5.3* 5.2*  ALBUMIN 3.8 3.0* 2.7* 2.7*   Cardiac Enzymes: Recent Labs  Lab 08/27/22 0915 08/27/22 1156 08/29/22 0419  CKTOTAL 2,783* 2,743* 1,197*   Recent Results (from the past 240 hour(s))  Urine Culture     Status: None   Collection Time: 08/27/22  9:32 AM   Specimen: Urine, Catheterized  Result Value Ref Range Status   Specimen Description   Final    URINE, CATHETERIZED Performed at [facility], [address], [place], [place] [address]    Special Requests   Final    NONE Performed at [facility], [address] [place], [place] [address]    Culture   Final    NO GROWTH Performed at [facility], [place] [address] [place], [place] [address]    Report Status 08/28/2022 FINAL  Final  Resp Panel by RT-PCR (Flu A&B, Covid) Anterior Nasal Swab     Status: Abnormal   Collection Time: 08/27/22  9:33 AM   Specimen: Anterior Nasal Swab  Result Value Ref Range Status   SARS Coronavirus 2 by RT PCR POSITIVE (A) NEGATIVE Final    Comment: (NOTE) SARS-CoV-2 target nucleic acids are DETECTED.  The SARS-CoV-2 RNA is generally detectable in upper respiratory specimens during the acute phase of infection. Positive results are indicative of the presence of the identified virus, but do not rule out bacterial infection or co-infection with other pathogens not detected by the test. Clinical correlation with patient history and other diagnostic information is necessary to determine patient infection status. The expected result is Negative.  Fact Sheet for  Patients: EntrepreneurPulse.com.au  Fact Sheet for Healthcare Providers: IncredibleEmployment.be  This test is not yet approved or cleared by the [place] FDA and  has been authorized for detection and/or diagnosis of SARS-CoV-2 by FDA under an Emergency Use Authorization (EUA).  This EUA will remain in effect (meaning this test can be used) for the duration of  the COVID-19 declaration under Section 564(b)(1) of the A ct, 21 U.S.C. section 360bbb-3(b)(1), unless  the authorization is terminated or revoked sooner.     Influenza A by PCR NEGATIVE NEGATIVE Final   Influenza B by PCR NEGATIVE NEGATIVE Final    Comment: (NOTE) The Xpert Xpress SARS-CoV-2/FLU/RSV plus assay is intended as an aid in the diagnosis of influenza from Nasopharyngeal swab specimens and should not be used as a sole basis for treatment. Nasal washings and aspirates are unacceptable for Xpert Xpress SARS-CoV-2/FLU/RSV testing.  Fact Sheet for Patients: EntrepreneurPulse.com.au  Fact Sheet for Healthcare Providers: IncredibleEmployment.be  This test is not yet approved or cleared by the [place] FDA and has been authorized for detection and/or diagnosis of SARS-CoV-2 by FDA under an Emergency Use Authorization (EUA). This EUA will remain in effect (meaning this test can be used) for the duration of the COVID-19 declaration under Section 564(b)(1) of the Act, 21 U.S.C. section 360bbb-3(b)(1), unless the authorization is terminated or revoked.  Performed at [facility], [address], [place], [place] [address]   Blood Culture (routine x 2)     Status: None (Preliminary result)   Collection Time: 08/27/22 10:18 AM   Specimen: Right Antecubital; Blood  Result Value Ref Range Status   Specimen Description RIGHT ANTECUBITAL  Final   Special Requests   Final    BOTTLES DRAWN AEROBIC AND ANAEROBIC Blood Culture results may not be  optimal due to an excessive volume of blood received in culture bottles   Culture   Final    NO GROWTH 3 DAYS Performed at [facility], [address] [place], [place] [address]    Report Status PENDING  Incomplete  Blood Culture (routine x 2)     Status: None (Preliminary result)   Collection Time: 08/27/22 10:18 AM   Specimen: Left Antecubital; Blood  Result Value Ref Range Status   Specimen Description LEFT ANTECUBITAL  Final   Special Requests   Final    BOTTLES DRAWN AEROBIC AND ANAEROBIC Blood Culture results may not be optimal due to an excessive volume of blood received in culture bottles   Culture   Final    NO GROWTH 3 DAYS Performed at [facility], [address] [place], [place] [address]    Report Status PENDING  Incomplete     Radiology Studies: No results found.   Scheduled Meds:  vitamin C  500 mg Oral Daily   aspirin EC  81 mg Oral Daily   carvedilol  3.125 mg Oral BID   citalopram  20 mg Oral Daily   clopidogrel  75 mg Oral Daily   heparin  5,000 Units Subcutaneous Q8H   insulin aspart  0-15 Units Subcutaneous TID WC   insulin aspart  0-5 Units Subcutaneous QHS   multivitamin with minerals  1 tablet Oral Daily   nirmatrelvir/ritonavir EUA (renal dosing)  2 tablet Oral BID   potassium chloride SA  20 mEq Oral Daily   sodium chloride flush  3 mL Intravenous Q12H   sodium chloride flush  3 mL Intravenous Q12H   zinc sulfate  220 mg Oral Daily   Continuous Infusions:  sodium chloride 125 mL/hr at 08/31/22 5374   sodium chloride     lactated ringers Stopped (08/27/22 2119)     LOS: 3 days    Barton Dubois M.D on 08/31/2022   Go to [email or web address] - for contact info  [facility] - Office  [phone]  If 7PM-7AM, please contact night-coverage [email or web address] 08/31/2022, 4:07 PM

## 2022-08-31 NOTE — Care Management Important Message (Signed)
Important Message  Patient Details  Name: Stephen Graves MRN: [id number] Date of Birth: 1956/03/13   Medicare Important Message Given:  N/A - LOS <3 / Initial given by admissions     Tommy Medal 08/31/2022, 4:38 PM

## 2022-08-31 NOTE — Progress Notes (Signed)
BP 184/124 MAP 142. Pt states he feels it is high because of his nerves/anxiety, prn hydralazine order in place. Notified MD Zierle-Ghosh to make aware of anxiety, states to give hydralazine.

## 2022-09-01 DIAGNOSIS — U071 COVID-19: Secondary | ICD-10-CM | POA: Diagnosis not present

## 2022-09-01 DIAGNOSIS — W19XXXD Unspecified fall, subsequent encounter: Secondary | ICD-10-CM | POA: Diagnosis not present

## 2022-09-01 LAB — CULTURE, BLOOD (ROUTINE X 2)
Culture: NO GROWTH
Culture: NO GROWTH

## 2022-09-01 LAB — GLUCOSE, CAPILLARY
Glucose-Capillary: 104 mg/dL — ABNORMAL HIGH (ref 70–99)
Glucose-Capillary: 120 mg/dL — ABNORMAL HIGH (ref 70–99)
Glucose-Capillary: 112 mg/dL — ABNORMAL HIGH (ref 70–99)
Glucose-Capillary: 124 mg/dL — ABNORMAL HIGH (ref 70–99)

## 2022-09-01 NOTE — Progress Notes (Signed)
   09/01/22 2323  Vitals  BP (!) 175/122  MAP (mmHg) 139  BP Location Left Arm  BP Method Automatic  Patient Position (if appropriate) Sitting  MEWS COLOR  MEWS Score Color Green  MEWS Score  MEWS Temp 0  MEWS Systolic 0  MEWS Pulse 0  MEWS RR 0  MEWS LOC 0  MEWS Score 0     Patients blood pressure elevated. Blood pressure 175/122 after receiving scheduled Coreg. Patient given PRN hydralazine per order. Rechecked blood pressure which is now 168/93.

## 2022-09-01 NOTE — TOC Progression Note (Signed)
Transition of Care [facility]) - Progression Note    Patient Details  Name: Stephen Graves MRN: [id number] Date of Birth: 1955/10/12  Transition of Care [facility]) CM/SW Contact  Joanne Chars, LCSW Phone Number: 09/01/2022, 11:16 AM  Clinical Narrative:   CSW spoke with [facility].  They can accept pt tomorrow.  Pt is covid positive but they can take after 5 days if symptom free. MD informed.  CSW asked to call pt sister Izora Gala, [phone].  CSW updated her on the above.  She reports pt sister Clarise Cruz is planning to transport pt to [facility].      Expected Discharge Plan: [place] Barriers to Discharge: Continued Medical Work up  Expected Discharge Plan and Services Expected Discharge Plan: [place] arrangements for the past 2 months: Mobile Home                                       Social Determinants of Health (SDOH) Interventions    Readmission Risk Interventions     No data to display

## 2022-09-01 NOTE — Progress Notes (Signed)
PROGRESS NOTE     Trennon Torbeck, is a 66 y.o. male, DOB - 1956-09-22, WNU:[id number]  Admit date - 08/27/2022   Admitting Physician Courage Denton Brick, MD  Outpatient Primary MD for the patient is [facility]  LOS - 4  Chief Complaint  Patient presents with   Fall        Brief Narrative:    66 y.o. male  with PMH of CAD s/p DES to mid-distal LAD in 06/2017, chronic chest pain, hypertension, hyperlipidemia, anxiety admitted from home on 08/27/2022 after recurrent falls with right-sided rib fractures and elevated CKs in the setting of COVID-19 infection    -Assessment and Plan: 1)S/p Fall with right anterior fifth rib fracture and elevated CPK/dehydration/traumatic rhabdomyolysis: -CK 2,783 >>> 2,743>>1197 -Lactic acid 2.5 repeat 2.1 -Patient denies chest pains or syncope -Troponin 48 >> 41 -EKG is not acute -D-dimer is elevated however VQ scan is unremarkable --T. bili 4.5 AST of 51 otherwise LFTs WNL -Chest x-ray without acute findings -CT head and brain MRI without acute findings CT C-spine without acute findings CT abdomen pelvis without acute findings -CT chest with acute nondisplaced fracture of the right anterior fifth rib -Continue to maintain adequate hydration.  -Continue to hold Crestor due to elevated CPKs -Physical therapist recommends SNF rehab   2) H/o chronic anemia---- hemoglobin 13.2 which is higher than baseline suspect some degree of hemoconcentration- --anticipate that hemoglobin will drop with hydration/hemodilution -on 08/28/2022--Hgb is now back to baseline range currently 10.8 after hydration -No overt bleeding appreciated.   3) mild thrombocytopenia --not new monitor closely,  no bleeding concerns at this time   4)AKI--in retrospect the patient does not have CKD most likely has just AKI -Admission creatinine 1.46 -With hydration creatinine is down to 0.99 with GFR greater than 60 -- Continue to renally adjust medications,  avoid nephrotoxic agents / dehydration  / hypotension. -Patient advised to maintain adequate hydration.   5)COVID-19 infection --no significant hypoxia -Continue treatment empirically with Paxlovid (day 4/5) -No Covid pneumonia on chest imaging studies hold off on steroids --Check and trend inflammatory markers including D-dimer, ferritin and  CRP---also follow CBC and CMP --Supplemental oxygen to keep O2 sats above 93% ---- Encourage prone positioning  if able to tolerate --Attempt to maintain euvolemic state --Zinc and vitamin C as ordered -Albuterol inhaler as needed -Accu-Cheks/fingersticks while on high-dose steroids   6)Hx of CAD s/p stent to LAD: Patient had LHC in September 2023  CTO treated with successful PCI/DES overlapping previously placed stent. Planned for DAPT with ASA/Brilinta for at least one year, likely lifelong given extensive stenting in the LAD. No recurrent chest pain. Seen by CR.  -- continue on ASA, plavix, Coreg 6.'25mg'$  BID,  -Prior echo showed  EF 60-65% with no rWMA noted, severe biatrial enlargement -Repeat echo from 08/28/2022 with EF of 60 to 65%, no regional wall motion abnormalities, no aortic stenosis no mitral stenosis -Continue to hold Crestor due to elevated CPKs -Continue to hold losartan due to concerns about risk of AKI on CKD given dehydration   7)DM2- Hgb A1c 6.4 reflecting fair diabetic control PTA -Hold metformin Use Novolog/Humalog Sliding scale insulin with Accu-Cheks/Fingersticks as ordered    8)Anxiety: c/n  Celexa  9)Disposition/Recurrent falls---PTA pt lived alone and did very poorly, patient has significant limitations with mobility related ADLs- this patient needs to continue to be monitored in the hospital until a SNF bed is obtained as she is not safe to go home with her current physcical  limitations -Physical therapy evaluation appreciated recommends SNF rehab   Dispo: The patient is from: Home              Anticipated d/c is to:  SNF              Anticipated d/c to skilled nursing facility once bed available/insurance authorization clear.              Patient currently is medically stable to d/c.  Barriers: Not Clinically Stable-   Code Status : -  Code Status: Full Code   Family Communication:   NA (patient is alert, awake and coherent)  DVT Prophylaxis  :   - SCDs   heparin injection 5,000 Units Start: 08/27/22 2200 SCDs Start: 08/27/22 1607 Place TED hose Start: 08/27/22 1607   Lab Results  Component Value Date   PLT 97 (L) 08/30/2022    Inpatient Medications  Scheduled Meds:  vitamin C  500 mg Oral Daily   aspirin EC  81 mg Oral Daily   carvedilol  3.125 mg Oral BID   citalopram  20 mg Oral Daily   clopidogrel  75 mg Oral Daily   heparin  5,000 Units Subcutaneous Q8H   insulin aspart  0-15 Units Subcutaneous TID WC   insulin aspart  0-5 Units Subcutaneous QHS   multivitamin with minerals  1 tablet Oral Daily   potassium chloride SA  20 mEq Oral Daily   sodium chloride flush  3 mL Intravenous Q12H   sodium chloride flush  3 mL Intravenous Q12H   zinc sulfate  220 mg Oral Daily   Continuous Infusions:  sodium chloride     lactated ringers Stopped (08/27/22 2119)   PRN Meds:.sodium chloride, acetaminophen **OR** acetaminophen, bisacodyl, chlorpheniramine-HYDROcodone, guaiFENesin-dextromethorphan, hydrALAZINE, Ipratropium-Albuterol, ondansetron **OR** ondansetron (ZOFRAN) IV, oxyCODONE, polyethylene glycol, sodium chloride flush, temazepam, traZODone   Anti-infectives (From admission, onward)    Start     Dose/Rate Route Frequency Ordered Stop   08/27/22 1500  nirmatrelvir/ritonavir EUA (renal dosing) (PAXLOVID) 2 tablet        2 tablet Oral 2 times daily 08/27/22 1419 09/01/22 1884        Subjective: [name] with no significant events overnight, no chest pain, shortness of breath, reports generalized weakness, asking to sit on the chair for few hours today, I have informed  staff, will keep him on the chair for few hours today.     Objective: Vitals:   08/31/22 0603 08/31/22 2052 08/31/22 2200 09/01/22 0358  BP: (!) 156/101 (!) 203/115 (!) 156/104 (!) 176/116  Pulse: 85 74  92  Resp:  19  19  Temp:  98.6 F (37 C)  98.2 F (36.8 C)  TempSrc:      SpO2:  98%  99%  Weight:      Height:        Intake/Output Summary (Last 24 hours) at 09/01/2022 1151 Last data filed at 09/01/2022 1000 Gross per 24 hour  Intake 240 ml  Output 2150 ml  Net -1910 ml   Filed Weights   08/27/22 0919 08/28/22 0023  Weight: 97.1 kg 99.3 kg    Physical Exam  Awake Alert, Oriented X 3, weak, frail and deconditioned Symmetrical Chest wall movement, Good air movement bilaterally, CTAB RRR,No Gallops,Rubs or new Murmurs, No Parasternal Heave +ve B.Sounds, Abd Soft, No tenderness, No rebound - guarding or rigidity. No Cyanosis, Clubbing or edema, No new Rash or bruise     Data Reviewed: I have personally  reviewed following labs and imaging studies  CBC: Recent Labs  Lab 08/27/22 0915 08/28/22 0535 08/29/22 0419 08/30/22 0532  WBC 9.7 6.2 3.7* 3.8*  NEUTROABS 8.6* 4.6 2.5 2.5  HGB 13.2 10.8* 10.1* 9.9*  HCT 41.7 34.1* 32.7* 32.1*  MCV 88.7 89.3 90.1 91.2  PLT 144* 110* 95* 97*   Basic Metabolic Panel: Recent Labs  Lab 08/27/22 0915 08/28/22 0535 08/29/22 0419 08/30/22 0532  NA 136 137 139 139  K 4.0 3.7 3.6 3.9  CL 103 107 110 113*  CO2 22 22 21* 21*  GLUCOSE 174* 101* 93 84  BUN 35* 32* 28* 23  CREATININE 1.46* 1.08 1.09 0.99  CALCIUM 9.1 8.4* 8.0* 8.0*  MG 2.1 2.1 2.2 2.1  PHOS  --  3.3 3.1 3.7   GFR: Estimated Creatinine Clearance: 85.8 mL/min (by C-G formula based on SCr of 0.99 mg/dL).  Liver Function Tests: Recent Labs  Lab 08/27/22 0915 08/28/22 0535 08/29/22 0419 08/30/22 0532  AST 51* 44* 37 27  ALT '21 18 18 18  '$ ALKPHOS 68 49 49 47  BILITOT 1.5* 1.2 0.9 0.6  PROT 6.9 5.7* 5.3* 5.2*  ALBUMIN 3.8 3.0* 2.7* 2.7*   Cardiac  Enzymes: Recent Labs  Lab 08/27/22 0915 08/27/22 1156 08/29/22 0419  CKTOTAL 2,783* 2,743* 1,197*   Recent Results (from the past 240 hour(s))  Urine Culture     Status: None   Collection Time: 08/27/22  9:32 AM   Specimen: Urine, Catheterized  Result Value Ref Range Status   Specimen Description   Final    URINE, CATHETERIZED Performed at [facility], [address], [place], [place] [address]    Special Requests   Final    NONE Performed at [facility], [address] [place], [place] [address]    Culture   Final    NO GROWTH Performed at [facility], [place] [address] [place],  [address]    Report Status 08/28/2022 FINAL  Final  Resp Panel by RT-PCR (Flu A&B, Covid) Anterior Nasal Swab     Status: Abnormal   Collection Time: 08/27/22  9:33 AM   Specimen: Anterior Nasal Swab  Result Value Ref Range Status   SARS Coronavirus 2 by RT PCR POSITIVE (A) NEGATIVE Final    Comment: (NOTE) SARS-CoV-2 target nucleic acids are DETECTED.  The SARS-CoV-2 RNA is generally detectable in upper respiratory specimens during the acute phase of infection. Positive results are indicative of the presence of the identified virus, but do not rule out bacterial infection or co-infection with other pathogens not detected by the test. Clinical correlation with patient history and other diagnostic information is necessary to determine patient infection status. The expected result is Negative.  Fact Sheet for Patients: EntrepreneurPulse.com.au  Fact Sheet for Healthcare Providers: IncredibleEmployment.be  This test is not yet approved or cleared by the [place] FDA and  has been authorized for detection and/or diagnosis of SARS-CoV-2 by FDA under an Emergency Use Authorization (EUA).  This EUA will remain in effect (meaning this test can be used) for the duration of  the COVID-19 declaration under Section 564(b)(1) of the A ct, 21 U.S.C.  section 360bbb-3(b)(1), unless the authorization is terminated or revoked sooner.     Influenza A by PCR NEGATIVE NEGATIVE Final   Influenza B by PCR NEGATIVE NEGATIVE Final    Comment: (NOTE) The Xpert Xpress SARS-CoV-2/FLU/RSV plus assay is intended as an aid in the diagnosis of influenza from Nasopharyngeal swab specimens and  should not be used as a sole basis for treatment. Nasal washings and aspirates are unacceptable for Xpert Xpress SARS-CoV-2/FLU/RSV testing.  Fact Sheet for Patients: EntrepreneurPulse.com.au  Fact Sheet for Healthcare Providers: IncredibleEmployment.be  This test is not yet approved or cleared by the [place] FDA and has been authorized for detection and/or diagnosis of SARS-CoV-2 by FDA under an Emergency Use Authorization (EUA). This EUA will remain in effect (meaning this test can be used) for the duration of the COVID-19 declaration under Section 564(b)(1) of the Act, 21 U.S.C. section 360bbb-3(b)(1), unless the authorization is terminated or revoked.  Performed at [facility], [address] [place], [place] [address]   Blood Culture (routine x 2)     Status: None   Collection Time: 08/27/22 10:18 AM   Specimen: Right Antecubital; Blood  Result Value Ref Range Status   Specimen Description RIGHT ANTECUBITAL  Final   Special Requests   Final    BOTTLES DRAWN AEROBIC AND ANAEROBIC Blood Culture results may not be optimal due to an excessive volume of blood received in culture bottles   Culture   Final    NO GROWTH 5 DAYS Performed at [facility], [address] [place], [place] [address]    Report Status 09/01/2022 FINAL  Final  Blood Culture (routine x 2)     Status: None   Collection Time: 08/27/22 10:18 AM   Specimen: Left Antecubital; Blood  Result Value Ref Range Status   Specimen Description LEFT ANTECUBITAL  Final   Special Requests   Final    BOTTLES DRAWN AEROBIC AND ANAEROBIC Blood Culture  results may not be optimal due to an excessive volume of blood received in culture bottles   Culture   Final    NO GROWTH 5 DAYS Performed at [facility], [address], [place], [place] [address]    Report Status 09/01/2022 FINAL  Final     Radiology Studies: No results found.   Scheduled Meds:  vitamin C  500 mg Oral Daily   aspirin EC  81 mg Oral Daily   carvedilol  3.125 mg Oral BID   citalopram  20 mg Oral Daily   clopidogrel  75 mg Oral Daily   heparin  5,000 Units Subcutaneous Q8H   insulin aspart  0-15 Units Subcutaneous TID WC   insulin aspart  0-5 Units Subcutaneous QHS   multivitamin with minerals  1 tablet Oral Daily   potassium chloride SA  20 mEq Oral Daily   sodium chloride flush  3 mL Intravenous Q12H   sodium chloride flush  3 mL Intravenous Q12H   zinc sulfate  220 mg Oral Daily   Continuous Infusions:  sodium chloride     lactated ringers Stopped (08/27/22 2119)     LOS: 4 days    Phillips Climes M.D on 09/01/2022   Go to [email or web address] - for contact info  [facility] - Office  [phone]  If 7PM-7AM, please contact night-coverage [email or web address] 09/01/2022, 11:51 AM

## 2022-09-02 DIAGNOSIS — F339 Major depressive disorder, recurrent, unspecified: Secondary | ICD-10-CM | POA: Diagnosis not present

## 2022-09-02 DIAGNOSIS — R748 Abnormal levels of other serum enzymes: Secondary | ICD-10-CM | POA: Diagnosis not present

## 2022-09-02 DIAGNOSIS — M6281 Muscle weakness (generalized): Secondary | ICD-10-CM | POA: Diagnosis not present

## 2022-09-02 DIAGNOSIS — Y92009 Unspecified place in unspecified non-institutional (private) residence as the place of occurrence of the external cause: Secondary | ICD-10-CM | POA: Diagnosis not present

## 2022-09-02 DIAGNOSIS — Z9181 History of falling: Secondary | ICD-10-CM | POA: Diagnosis not present

## 2022-09-02 DIAGNOSIS — R69 Illness, unspecified: Secondary | ICD-10-CM | POA: Diagnosis not present

## 2022-09-02 DIAGNOSIS — Z8616 Personal history of COVID-19: Secondary | ICD-10-CM | POA: Diagnosis not present

## 2022-09-02 DIAGNOSIS — G4700 Insomnia, unspecified: Secondary | ICD-10-CM | POA: Diagnosis not present

## 2022-09-02 DIAGNOSIS — S2231XA Fracture of one rib, right side, initial encounter for closed fracture: Secondary | ICD-10-CM | POA: Diagnosis not present

## 2022-09-02 DIAGNOSIS — R4181 Age-related cognitive decline: Secondary | ICD-10-CM | POA: Diagnosis not present

## 2022-09-02 DIAGNOSIS — E119 Type 2 diabetes mellitus without complications: Secondary | ICD-10-CM

## 2022-09-02 DIAGNOSIS — I10 Essential (primary) hypertension: Secondary | ICD-10-CM | POA: Diagnosis not present

## 2022-09-02 DIAGNOSIS — X19XXXA Contact with other heat and hot substances, initial encounter: Secondary | ICD-10-CM | POA: Diagnosis not present

## 2022-09-02 DIAGNOSIS — R279 Unspecified lack of coordination: Secondary | ICD-10-CM | POA: Diagnosis not present

## 2022-09-02 DIAGNOSIS — W19XXXD Unspecified fall, subsequent encounter: Secondary | ICD-10-CM | POA: Diagnosis not present

## 2022-09-02 DIAGNOSIS — W19XXXA Unspecified fall, initial encounter: Secondary | ICD-10-CM | POA: Diagnosis not present

## 2022-09-02 DIAGNOSIS — E109 Type 1 diabetes mellitus without complications: Secondary | ICD-10-CM | POA: Diagnosis not present

## 2022-09-02 DIAGNOSIS — S2231XD Fracture of one rib, right side, subsequent encounter for fracture with routine healing: Secondary | ICD-10-CM | POA: Diagnosis not present

## 2022-09-02 DIAGNOSIS — R531 Weakness: Secondary | ICD-10-CM | POA: Diagnosis not present

## 2022-09-02 DIAGNOSIS — U071 COVID-19: Secondary | ICD-10-CM | POA: Diagnosis not present

## 2022-09-02 DIAGNOSIS — E559 Vitamin D deficiency, unspecified: Secondary | ICD-10-CM | POA: Diagnosis not present

## 2022-09-02 DIAGNOSIS — Z23 Encounter for immunization: Secondary | ICD-10-CM | POA: Diagnosis present

## 2022-09-02 DIAGNOSIS — F331 Major depressive disorder, recurrent, moderate: Secondary | ICD-10-CM | POA: Diagnosis not present

## 2022-09-02 DIAGNOSIS — M6282 Rhabdomyolysis: Secondary | ICD-10-CM | POA: Diagnosis not present

## 2022-09-02 DIAGNOSIS — R2689 Other abnormalities of gait and mobility: Secondary | ICD-10-CM | POA: Diagnosis not present

## 2022-09-02 DIAGNOSIS — Z955 Presence of coronary angioplasty implant and graft: Secondary | ICD-10-CM | POA: Diagnosis not present

## 2022-09-02 DIAGNOSIS — I2510 Atherosclerotic heart disease of native coronary artery without angina pectoris: Secondary | ICD-10-CM | POA: Diagnosis not present

## 2022-09-02 LAB — GLUCOSE, CAPILLARY
Glucose-Capillary: 111 mg/dL — ABNORMAL HIGH (ref 70–99)
Glucose-Capillary: 124 mg/dL — ABNORMAL HIGH (ref 70–99)

## 2022-09-02 MED ORDER — OXYCODONE HCL 5 MG PO TABS: 5.0000 mg | ORAL_TABLET | Freq: Three times a day (TID) | ORAL | 0 refills | Status: DC | PRN

## 2022-09-02 MED ORDER — ASCORBIC ACID 500 MG PO TABS: 500.0000 mg | ORAL_TABLET | Freq: Every day | ORAL | 0 refills | Status: AC

## 2022-09-02 MED ORDER — SORBITOL 70 % SOLN
960.0000 mL | TOPICAL_OIL | Freq: Once | ORAL | Status: AC
  Administered 2022-09-02: 960 mL via RECTAL
  Filled 2022-09-02: qty 240

## 2022-09-02 MED ORDER — TEMAZEPAM 7.5 MG PO CAPS: 7.5000 mg | ORAL_CAPSULE | Freq: Every evening | ORAL | 0 refills | Status: DC | PRN

## 2022-09-02 MED ORDER — ZINC SULFATE 220 (50 ZN) MG PO CAPS: 220.0000 mg | ORAL_CAPSULE | Freq: Every day | ORAL | 1 refills | Status: DC

## 2022-09-02 NOTE — Discharge Summary (Signed)
Physician Discharge Summary   Patient: Stephen Graves MRN: [id number] DOB: [birth year]  Admit date:     08/27/2022  Discharge date: 09/02/22  Discharge Physician: Barton Dubois   PCP: [place]   Recommendations at discharge:  Reassess blood pressure and adjust antihypertensive regimen Repeat base metabolic panel to follow ultralights renal function Repeat CBC to follow hemoglobin trend/platelet count stability.  Discharge Diagnoses: Principal Problem:   Falls Active Problems:   Presence of drug coated stent in LAD coronary artery   COVID-19 virus infection   Essential hypertension   Coronary artery disease involving native coronary 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 (HCC)   Elevated CK   Controlled type 2 diabetes mellitus without complication, without long-term current use of insulin (HCC)   Generalized weakness   Brief Hospital admission course: As per H&P written by Dr. Denton Brick on 06/27/2022  Stephen Graves  is a 66 y.o. male  with PMH of CAD s/p DES to mid-distal LAD in 06/2017, chronic chest pain, hypertension, hyperlipidemia, anxiety presents to the ED via EMS after falling twice at home since last night.  Patient was found by his brother around 60 am on 08/27/2022,  -Apparently fell for the second time and laid on his belly could not get up unfortunately his belly was lying against his heater vent---some erythema over the anterior abdomen is noted - Patient does not think he lost consciousness -Denies frank chest pains or palpitations dizziness - No Nausea, Vomiting or Diarrhea   -In the ED patient is found to be positive for COVID-19 infection   Troponin 48 >> 41 Lactic acid 2.5 repeat 2.1 -UA is not consistent with UTI -D-dimer is elevated however VQ scan is unremarkable CK 2,783 >>> 2,743 WBC 9.7 hemoglobin 13.2 which is higher than baseline suspect some degree of hemoconcentration- -platelets 144 Creatinine 1.46  which is close to baseline -T. bili 4.5 AST of 51 otherwise LFTs WNL -Chest x-ray without acute findings -CT head and brain MRI without acute findings CT C-spine without acute findings CT abdomen pelvis without acute findings -CT chest with acute nondisplaced fracture of the right anterior fifth rib.  -Assessment and Plan: 1)S/p Fall with right anterior fifth rib fracture and elevated CPK/dehydration/traumatic rhabdomyolysis: -Lactic acid 2.5 at time of admission; resolved with fluid resuscitation. -Patient denies chest pains or syncope -Troponin 48 >> 41 -EKG demonstrated no acute ischemic changes. -D-dimer is elevated however VQ scan was unremarkable --T. bili 4.5 AST of 51 otherwise LFTs WNL -Chest x-ray without acute cardiopulmonary findings -CT head and brain MRI without acute findings CT C-spine without acute findings CT abdomen pelvis without acute findings -CT chest with acute nondisplaced fracture of the right anterior fifth rib; analgesics. -Patient advised to maintain adequate hydration -Physical therapy has recommended transfer to skilled nursing facility for rehabilitation at time of discharge.   2) H/o chronic anemia---- hemoglobin 13.2 which is higher than baseline suspect some degree of hemoconcentration- --anticipate that hemoglobin will drop with hydration/hemodilution -on 08/28/2022--Hgb is now back to baseline range currently 10.8 after hydration -No overt bleeding appreciated. -Repeat CBC at follow-up visit to assess stability and trend.   3) mild thrombocytopenia --not new. -monitor closely,  no bleeding concerns at this time   4)AKI--in retrospect the patient does not have CKD most likely has just AKI -Admission creatinine 1.46 -With hydration creatinine is down to 0.99 with GFR greater than 60. -Patient advised to maintain adequate hydration.   5)COVID-19 infection --no significant hypoxia -  Patient completed treatment with Paxil bid (5 days) while  inpatient. -No Covid pneumonia on chest imaging studies hold off on steroids -Excellent oxygen saturations without supplementation demonstrated. -Patient instructed to continue using incentive spirometer and flutter valve -Continue vitamin C and zinc at time of discharge -Given lack of hypoxia, there was not need for steroids throughout hospitalization.   6)Hx of CAD s/p stent to LAD: Patient had LHC in September 2023  CTO treated with successful PCI/DES overlapping previously placed stent. Planned for DAPT with ASA/Brilinta for at least one year, likely lifelong given extensive stenting in the LAD. No recurrent chest pain. Seen by CR.  -- continue on ASA, plavix, Coreg 6.'25mg'$  BID,  -Prior echo showed  EF 60-65% with no rWMA noted, severe biatrial enlargement -Repeat echo from 08/28/2022 with EF of 60 to 65%, no regional wall motion abnormalities, no aortic stenosis no mitral stenosis -No chest pain at discharge -Continue the use of Crestor and losartan.   7)DM2- Hgb A1c 6.4 reflecting fair diabetic control PTA -Continue to follow CBGs and further adjust hypoglycemic regimen as needed Continue modified carbohydrate diet Resume home oral hypoglycemic agents.   8)Anxiety: c/n  Celexa -No suicidal ideation or hallucinations; stable mood.   9)Disposition/Recurrent falls---PTA pt lived alone and did very poorly, patient has significant limitations with mobility related ADLs-hemodynamically stable to be transferred to skilled nursing facility for further care and rehabilitation.  Consultants: None Procedures performed: See below for x-ray reports Disposition: Stable and improved; patient discharged to skilled nursing facility for further care and rehabilitation.  Diet recommendation:  Discharge Diet Orders (From admission, onward)     Start     Ordered   09/02/22 0000  Diet - low sodium heart healthy        09/02/22 1046   09/02/22 0000  Diet Carb Modified        09/02/22 1046            DISCHARGE MEDICATION: Allergies as of 09/02/2022       Reactions   Lipitor [atorvastatin]    Pain in neck and legs after taking medication        Medication List     STOP taking these medications    chlorhexidine 4 % external liquid Commonly known as: Hibiclens   mupirocin ointment 2 % Commonly known as: BACTROBAN   ticagrelor 90 MG Tabs tablet Commonly known as: BRILINTA       TAKE these medications    ascorbic acid 500 MG tablet Commonly known as: VITAMIN C Take 1 tablet (500 mg total) by mouth daily. Start taking on: September 03, 2022   aspirin EC 81 MG tablet Take 1 tablet (81 mg total) by mouth daily. Swallow whole.   carvedilol 3.125 MG tablet Commonly known as: COREG Take 1 tablet (3.125 mg total) by mouth 2 (two) times daily. What changed: when to take this   citalopram 40 MG tablet Commonly known as: CELEXA Take 0.5 tablets (20 mg total) by mouth daily.   clopidogrel 75 MG tablet Commonly known as: PLAVIX Take 1 tablet (75 mg total) by mouth daily.   fish oil-omega-3 fatty acids 1000 MG capsule Take 1 g by mouth daily.   losartan 50 MG tablet Commonly known as: COZAAR Take 0.5 tablets (25 mg total) by mouth in the morning and at bedtime.   metFORMIN 500 MG 24 hr tablet Commonly known as: GLUCOPHAGE-XR Take 1 tablet by mouth daily.   nitroGLYCERIN 0.4 MG SL tablet Commonly known as:  NITROSTAT DISSOLVE ONE TABLET UNDER THE TONGUE EVERY 5 MINUTES AS NEEDED FOR CHEST PAIN.  DO NOT EXCEED A TOTAL OF 3 DOSES IN 15 MINUTES   oxyCODONE 5 MG immediate release tablet Commonly known as: Oxy IR/ROXICODONE Take 1 tablet (5 mg total) by mouth every 8 (eight) hours as needed for severe pain.   potassium chloride SA 20 MEQ tablet Commonly known as: KLOR-CON M Take 20 mEq by mouth daily.   rosuvastatin 20 MG tablet Commonly known as: CRESTOR Take 1 tablet (20 mg total) by mouth daily.   temazepam 7.5 MG capsule Commonly known as:  RESTORIL Take 1 capsule (7.5 mg total) by mouth at bedtime as needed for sleep.   Vitamin D3 25 MCG (1000 UT) Caps Take 1 capsule by mouth daily.   zinc sulfate 220 (50 Zn) MG capsule Take 1 capsule (220 mg total) by mouth daily. Start taking on: September 03, 2022        Contact information for follow-up providers     [facility] [employer]. Schedule an appointment as soon as possible for a visit in 10 day(s).   Specialty: Family Medicine Contact information: [place] [address] [phone]              Contact information for after-discharge care     [facility] .   Service: Skilled Nursing Contact information: [address] [place] [phone]                    Discharge Exam: Danley Danker Weights   08/27/22 0919 08/28/22 0023  Weight: 97.1 kg 99.3 kg   General exam: Alert, awake, oriented x 3; following commands appropriately.  In no acute complaints.  Reports feeling the need of increasing physical activity but feels weak and deconditioned.  Expressing insomnia Respiratory system: Clear to auscultation. Respiratory effort normal.  Good saturation on room air; no using accessory muscles. Cardiovascular system:RRR. No rubs or gallops; no JVD. Gastrointestinal system: Abdomen is nondistended, soft and nontender. No organomegaly or masses felt. Normal bowel sounds heard. Central nervous system: Alert and oriented. No focal neurological deficits. Extremities: No cyanosis or clubbing. Skin: No petechiae. Psychiatry: Judgement and insight appear normal. Mood & affect appropriate.   Condition at discharge: Stable and improved.  The results of significant diagnostics from this hospitalization (including imaging, microbiology, ancillary and laboratory) are listed below for reference.   Imaging Studies: ECHOCARDIOGRAM  COMPLETE  Result Date: 08/28/2022    ECHOCARDIOGRAM REPORT   Patient Name:   BRONISLAUS VERDELL Date of Exam: 08/28/2022 Medical Rec #:  [id number]           Height:       71.0 in Accession #:    [id number]          Weight:       218.9 lb Date of Birth:  Apr 09, 1956           BSA:          2.191 m Patient Age:    41 years            BP:           136/88 mmHg Patient Gender: M                   HR:           96 bpm. Exam [facility] Procedure: 2D Echo, Cardiac Doppler, Color Doppler and Intracardiac            Opacification Agent Indications:    Syncope  History:        Patient has prior history of Echocardiogram examinations, most                 recent 05/28/2021. CAD, Prior Cardiac Surgery,                 Signs/Symptoms:Chest Pain; Risk Factors:Hypertension and                 Dyslipidemia. COVID +.  Sonographer:    Wenda Low Referring Phys: [place]  1. Left ventricular ejection fraction, by estimation, is 60 to 65%. The left ventricle has normal function. The left ventricle has no regional wall motion abnormalities. There is severe left ventricular hypertrophy. Left ventricular diastolic parameters  are indeterminate.  2. Right ventricular systolic function is normal. The right ventricular size is normal. Tricuspid regurgitation signal is inadequate for assessing PA pressure.  3. A small pericardial effusion is present. The pericardial effusion is circumferential.  4. The mitral valve is normal in structure. No evidence of mitral valve regurgitation. No evidence of mitral stenosis.  5. The aortic valve is tricuspid. Aortic valve regurgitation is not visualized. No aortic stenosis is present.  6. Aortic dilatation noted. There is mild dilatation of the aortic root, measuring 40 mm. There is mild dilatation of the ascending aorta, measuring 41 mm. FINDINGS  Left Ventricle: Left ventricular ejection fraction, by estimation, is 60 to 65%. The left ventricle has normal  function. The left ventricle has no regional wall motion abnormalities. Definity contrast agent was given IV to delineate the left ventricular  endocardial borders. The left ventricular internal cavity size was normal in size. There is severe left ventricular hypertrophy. Left ventricular diastolic parameters are indeterminate. Right Ventricle: The right ventricular size is normal. Right vetricular wall thickness was not well visualized. Right ventricular systolic function is normal. Tricuspid regurgitation signal is inadequate for assessing PA pressure. Left Atrium: Left atrial size was normal in size. Right Atrium: Right atrial size was not well visualized. Pericardium: A small pericardial effusion is present. The pericardial effusion is circumferential. Mitral Valve: The mitral valve is normal in structure. No evidence of mitral valve regurgitation. No evidence of mitral valve stenosis. MV peak gradient, 4.4 mmHg. The mean mitral valve gradient is 2.0 mmHg. Tricuspid Valve: The tricuspid valve is not well visualized. Tricuspid valve regurgitation is trivial. No evidence of tricuspid stenosis. Aortic Valve: The aortic valve is tricuspid. Aortic valve regurgitation is not visualized. No aortic stenosis is present. Aortic valve mean gradient measures 3.0 mmHg. Aortic valve peak gradient measures 5.3 mmHg. Aortic valve area, by VTI measures 2.52 cm. Pulmonic Valve: The pulmonic valve was not well visualized. Pulmonic valve regurgitation is trivial. No evidence of pulmonic stenosis. Aorta: Aortic dilatation noted. There is mild dilatation of the aortic root, measuring 40 mm. There is mild dilatation of the ascending aorta, measuring 41 mm. IAS/Shunts: No atrial level shunt detected by color flow Doppler.  LEFT VENTRICLE PLAX 2D LVIDd:         6.00 cm LVIDs:         3.90 cm LV PW:         1.40 cm LV IVS:        1.60 cm LVOT diam:     2.00 cm LV SV:  52 LV SV Index:   24 LVOT Area:     3.14 cm  RIGHT VENTRICLE  RV Basal diam:  4.05 cm RV Mid diam:    3.60 cm RV S prime:     13.70 cm/s TAPSE (M-mode): 3.0 cm LEFT ATRIUM           Index        RIGHT ATRIUM           Index LA diam:      4.50 cm 2.05 cm/m   RA Area:     25.30 cm LA Vol (A2C): 83.2 ml 37.97 ml/m  RA Volume:   78.70 ml  35.92 ml/m LA Vol (A4C): 56.8 ml 25.92 ml/m  AORTIC VALVE                    PULMONIC VALVE AV Area (Vmax):    2.66 cm     PV Vmax:       0.93 m/s AV Area (Vmean):   2.67 cm     PV Peak grad:  3.5 mmHg AV Area (VTI):     2.52 cm AV Vmax:           115.50 cm/s AV Vmean:          72.700 cm/s AV VTI:            0.205 m AV Peak Grad:      5.3 mmHg AV Mean Grad:      3.0 mmHg LVOT Vmax:         97.90 cm/s LVOT Vmean:        61.800 cm/s LVOT VTI:          0.164 m LVOT/AV VTI ratio: 0.80  AORTA Ao Root diam: 4.00 cm Ao Asc diam:  4.10 cm MITRAL VALVE MV Area (PHT): 3.37 cm     SHUNTS MV Area VTI:   2.22 cm     Systemic VTI:  0.16 m MV Peak grad:  4.4 mmHg     Systemic Diam: 2.00 cm MV Mean grad:  2.0 mmHg MV Vmax:       1.05 m/s MV Vmean:      56.8 cm/s MV Decel Time: 225 msec MV E velocity: 111.00 cm/s Carlyle Dolly MD Electronically signed by Carlyle Dolly MD Signature Date/Time: 08/28/2022/11:16:57 AM    Final    NM Pulmonary Perfusion  Result Date: 08/27/2022 CLINICAL DATA:  Elevated D-dimer level, chest pain for 1 day EXAM: NUCLEAR MEDICINE PERFUSION LUNG SCAN TECHNIQUE: Perfusion images were obtained in multiple projections after intravenous injection of radiopharmaceutical. Ventilation scans intentionally deferred if perfusion scan and chest x-ray adequate for interpretation during COVID 19 epidemic. RADIOPHARMACEUTICALS:  4.4 mCi Tc-59mMAA IV COMPARISON:  Chest CT 08/27/2022 and chest radiograph 08/27/2022 FINDINGS: No perfusion defect is identified in the lungs to suggest pulmonary embolus. Overall distribution of radiopharmaceutical is normal. IMPRESSION: 1. Normal perfusion lung scan, without findings to suggest acute  pulmonary embolus. Electronically Signed   By: WVan ClinesM.D.   On: 08/27/2022 18:22   MR BRAIN WO CONTRAST  Result Date: 08/27/2022 CLINICAL DATA:  Altered mental status. EXAM: MRI HEAD WITHOUT CONTRAST TECHNIQUE: Multiplanar, multiecho pulse sequences of the brain and surrounding structures were obtained without intravenous contrast. COMPARISON:  None Available. FINDINGS: Brain: No acute infarction, hemorrhage, hydrocephalus, extra-axial collection or mass lesion. Encephalomalacia and volume loss in the anterior left temporal lobe (series 14, image 8), likely related to prior infarct. There is sequela  of mild chronic microvascular ischemic change. Vascular: Normal flow voids. Skull and upper cervical spine: Normal marrow signal. Sinuses/Orbits: Mild mucosal thickening in the bilateral maxillary sinuses. Asymmetric right-sided mastoid effusion Other: None. IMPRESSION: 1. No acute intracranial process. 2. 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 Electronically Signed   By: Marin Roberts M.D.   On: 08/27/2022 16:44   CT HEAD WO CONTRAST  Result Date: 08/27/2022 CLINICAL DATA:  Unwitnessed fall, found down EXAM: CT HEAD WITHOUT CONTRAST CT CERVICAL SPINE WITHOUT CONTRAST TECHNIQUE: Multidetector CT imaging of the head and cervical spine was performed following the standard protocol without intravenous contrast. Multiplanar CT image reconstructions of the cervical spine were also generated. RADIATION DOSE REDUCTION: This exam was performed according to the departmental dose-optimization program which includes automated exposure control, adjustment of the mA and/or kV according to patient size and/or use of iterative reconstruction technique. COMPARISON:  CT head and cervical spine 09/08/2021 FINDINGS: CT HEAD FINDINGS Brain: There is no acute intracranial hemorrhage, extra-axial fluid collection, or acute infarct. Parenchymal volume is normal for age.  The ventricles are stable in size. 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 Gray-white differentiation is otherwise preserved. There is no mass lesion.  There is no mass effect or midline shift. Vascular: No hyperdense vessel or unexpected calcification. Skull: Normal. Negative for fracture or focal lesion. Sinuses/Orbits: The imaged paranasal sinuses are clear. The globes and orbits are unremarkable. Other: None. CT CERVICAL SPINE FINDINGS Alignment: There is reversal of the normal cervical lordosis. There is trace anterolisthesis of C5 on C6, unchanged. There is no other antero or retrolisthesis. There is no jumped or perched facet or other evidence of traumatic malalignment. Skull base and vertebrae: Skull base alignment is maintained. Vertebral body heights are preserved. There is no evidence of acute fracture. There is no suspicious osseous lesion. Soft tissues and spinal canal: No prevertebral fluid or swelling. No visible canal hematoma. Disc levels: There is disc space narrowing and degenerative endplate change most advanced at C6-C7. There is multilevel facet arthropathy there is no high-grade spinal canal stenosis. Upper chest: The imaged lung apices are clear. Other: None. IMPRESSION: 1. No acute intracranial pathology. 2. No acute fracture or traumatic malalignment of the cervical spine. Electronically Signed   By: Valetta Mole M.D.   On: 08/27/2022 12:12   CT CERVICAL SPINE WO CONTRAST  Result Date: 08/27/2022 CLINICAL DATA:  Unwitnessed fall, found down EXAM: CT HEAD WITHOUT CONTRAST CT CERVICAL SPINE WITHOUT CONTRAST TECHNIQUE: Multidetector CT imaging of the head and cervical spine was performed following the standard protocol without intravenous contrast. Multiplanar CT image reconstructions of the cervical spine were also generated. RADIATION DOSE REDUCTION: This exam was performed according to the departmental dose-optimization program which includes automated exposure control,  adjustment of the mA and/or kV according to patient size and/or use of iterative reconstruction technique. COMPARISON:  CT head and cervical spine 09/08/2021 FINDINGS: CT HEAD FINDINGS Brain: There is no acute intracranial hemorrhage, extra-axial fluid collection, or acute infarct. Parenchymal volume is normal for age. The ventricles are stable in size. 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 Gray-white differentiation is otherwise preserved. There is no mass lesion.  There is no mass effect or midline shift. Vascular: No hyperdense vessel or unexpected calcification. Skull: Normal. Negative for fracture or focal lesion. Sinuses/Orbits: The imaged paranasal sinuses are clear. The globes and orbits are unremarkable. Other: None. CT CERVICAL SPINE FINDINGS Alignment: There is reversal  of the normal cervical lordosis. There is trace anterolisthesis of C5 on C6, unchanged. There is no other antero or retrolisthesis. There is no jumped or perched facet or other evidence of traumatic malalignment. Skull base and vertebrae: Skull base alignment is maintained. Vertebral body heights are preserved. There is no evidence of acute fracture. There is no suspicious osseous lesion. Soft tissues and spinal canal: No prevertebral fluid or swelling. No visible canal hematoma. Disc levels: There is disc space narrowing and degenerative endplate change most advanced at C6-C7. There is multilevel facet arthropathy there is no high-grade spinal canal stenosis. Upper chest: The imaged lung apices are clear. Other: None. IMPRESSION: 1. No acute intracranial pathology. 2. No acute fracture or traumatic malalignment of the cervical spine. Electronically Signed   By: Valetta Mole M.D.   On: 08/27/2022 12:12   CT CHEST ABDOMEN PELVIS WO CONTRAST  Result Date: 08/27/2022 CLINICAL DATA:  Unwitnessed fall, found down. EXAM: CT CHEST, ABDOMEN AND PELVIS WITHOUT CONTRAST TECHNIQUE: Multidetector CT imaging of the chest, abdomen  and pelvis was performed following the standard protocol without IV contrast. RADIATION DOSE REDUCTION: This exam was performed according to the departmental dose-optimization program which includes automated exposure control, adjustment of the mA and/or kV according to patient size and/or use of iterative reconstruction technique. COMPARISON:  Same day chest radiograph FINDINGS: CT CHEST FINDINGS Cardiovascular: The heart size is normal. There is a trace pericardial effusion dense coronary artery calcifications are seen primarily affecting the left anterior descending artery. There is minimal calcified plaque in the thoracic aorta. Mediastinum/Nodes: The thyroid is unremarkable. The esophagus is grossly unremarkable. There is no mediastinal or axillary lymphadenopathy. There is no bulky hilar adenopathy, within the confines of noncontrast technique. Lungs/Pleura: The trachea and central airways are patent. There is no focal consolidation or pulmonary edema. There is no pleural effusion or pneumothorax. There are no suspicious nodules. Musculoskeletal: There is an acute nondisplaced fracture of the right anterior fifth rib (5-104). No other rib fracture is seen. There is no sternal fracture. There is no acute fracture or traumatic malalignment of the thoracic spine. There is multilevel degenerative change of the thoracic spine with flowing anterior osteophytes. CT ABDOMEN PELVIS FINDINGS Hepatobiliary: The liver and gallbladder are unremarkable. There is no biliary ductal dilatation. Pancreas: Unremarkable. Spleen: Unremarkable. Adrenals/Urinary Tract: The adrenals are unremarkable. The kidneys are unremarkable, with no focal lesion, stone, hydronephrosis, or hydroureter. The bladder is unremarkable. Stomach/Bowel: The stomach is unremarkable. There is no evidence of bowel obstruction. There is no abnormal bowel wall thickening or inflammatory change. There is colonic diverticulosis without evidence of acute  diverticulitis. The appendix is normal. Vascular/Lymphatic: There is mild calcified plaque in the nonaneurysmal abdominal aorta. There is no abdominal or pelvic lymphadenopathy. Reproductive: The prostate and seminal vesicles are unremarkable. Other: There is no ascites or free air. Musculoskeletal: There is no acute fracture in the pelvis. There is no acute fracture or traumatic malalignment of the lumbar spine. There is multilevel degenerative change. IMPRESSION: 1. Acute nondisplaced fracture of the right anterior fifth rib. 2. No other evidence of acute pathology in the chest, abdomen, or pelvis. 3. Trace pericardial effusion. 4. Dense coronary artery calcifications primarily affecting the left anterior descending artery. 5. Diverticulosis without evidence of acute diverticulitis. Electronically Signed   By: Valetta Mole M.D.   On: 08/27/2022 12:05   DG Chest Port 1 View  Result Date: 08/27/2022 CLINICAL DATA:  Two falls last night EXAM: PORTABLE CHEST 1 VIEW COMPARISON:  Chest radiograph 05/28/2021 FINDINGS: The cardiomediastinal silhouette is stable and within normal limits allowing for leftward patient rotation. There is no focal consolidation or pulmonary edema. There is no pleural effusion or pneumothorax. No displaced rib fracture or other acute osseous abnormality is identified. IMPRESSION: Stable chest with no radiographic evidence of acute cardiopulmonary process. Electronically Signed   By: Valetta Mole M.D.   On: 08/27/2022 10:29    Microbiology: Results for orders placed or performed during the hospital encounter of 08/27/22  Urine Culture     Status: None   Collection Time: 08/27/22  9:32 AM   Specimen: Urine, Catheterized  Result Value Ref Range Status   Specimen Description   Final    URINE, CATHETERIZED Performed at [facility], [address], [place], [place] [address]    Special Requests   Final    NONE Performed at [facility], [address], [place], [place] [address]     Culture   Final    NO GROWTH Performed at [facility], [place] [address] [place], [place] [address]    Report Status 08/28/2022 FINAL  Final  Resp Panel by RT-PCR (Flu A&B, Covid) Anterior Nasal Swab     Status: Abnormal   Collection Time: 08/27/22  9:33 AM   Specimen: Anterior Nasal Swab  Result Value Ref Range Status   SARS Coronavirus 2 by RT PCR POSITIVE (A) NEGATIVE Final    Comment: (NOTE) SARS-CoV-2 target nucleic acids are DETECTED.  The SARS-CoV-2 RNA is generally detectable in upper respiratory specimens during the acute phase of infection. Positive results are indicative of the presence of the identified virus, but do not rule out bacterial infection or co-infection with other pathogens not detected by the test. Clinical correlation with patient history and other diagnostic information is necessary to determine patient infection status. The expected result is Negative.  Fact Sheet for Patients: EntrepreneurPulse.com.au  Fact Sheet for Healthcare Providers: IncredibleEmployment.be  This test is not yet approved or cleared by the [place] FDA and  has been authorized for detection and/or diagnosis of SARS-CoV-2 by FDA under an Emergency Use Authorization (EUA).  This EUA will remain in effect (meaning this test can be used) for the duration of  the COVID-19 declaration under Section 564(b)(1) of the A ct, 21 U.S.C. section 360bbb-3(b)(1), unless the authorization is terminated or revoked sooner.     Influenza A by PCR NEGATIVE NEGATIVE Final   Influenza B by PCR NEGATIVE NEGATIVE Final    Comment: (NOTE) The Xpert Xpress SARS-CoV-2/FLU/RSV plus assay is intended as an aid in the diagnosis of influenza from Nasopharyngeal swab specimens and should not be used as a sole basis for treatment. Nasal washings and aspirates are unacceptable for Xpert Xpress SARS-CoV-2/FLU/RSV testing.  Fact Sheet for  Patients: EntrepreneurPulse.com.au  Fact Sheet for Healthcare Providers: IncredibleEmployment.be  This test is not yet approved or cleared by the [place] FDA and has been authorized for detection and/or diagnosis of SARS-CoV-2 by FDA under an Emergency Use Authorization (EUA). This EUA will remain in effect (meaning this test can be used) for the duration of the COVID-19 declaration under Section 564(b)(1) of the Act, 21 U.S.C. section 360bbb-3(b)(1), unless the authorization is terminated or revoked.  Performed at [facility], [address] [place], [place] [address]   Blood Culture (routine x 2)     Status: None   Collection Time: 08/27/22 10:18 AM   Specimen: Right Antecubital; Blood  Result Value Ref Range Status  Specimen Description RIGHT ANTECUBITAL  Final   Special Requests   Final    BOTTLES DRAWN AEROBIC AND ANAEROBIC Blood Culture results may not be optimal due to an excessive volume of blood received in culture bottles   Culture   Final    NO GROWTH 5 DAYS Performed at [facility], [address] [place], [place] [address]    Report Status 09/01/2022 FINAL  Final  Blood Culture (routine x 2)     Status: None   Collection Time: 08/27/22 10:18 AM   Specimen: Left Antecubital; Blood  Result Value Ref Range Status   Specimen Description LEFT ANTECUBITAL  Final   Special Requests   Final    BOTTLES DRAWN AEROBIC AND ANAEROBIC Blood Culture results may not be optimal due to an excessive volume of blood received in culture bottles   Culture   Final    NO GROWTH 5 DAYS Performed at [facility], [address] [place], [place] [address]    Report Status 09/01/2022 FINAL  Final    Labs: CBC: Recent Labs  Lab 08/27/22 0915 08/28/22 0535 08/29/22 0419 08/30/22 0532  WBC 9.7 6.2 3.7* 3.8*  NEUTROABS 8.6* 4.6 2.5 2.5  HGB 13.2 10.8* 10.1* 9.9*  HCT 41.7 34.1* 32.7* 32.1*  MCV 88.7 89.3 90.1 91.2  PLT 144* 110* 95* 97*    Basic Metabolic Panel: Recent Labs  Lab 08/27/22 0915 08/28/22 0535 08/29/22 0419 08/30/22 0532  NA 136 137 139 139  K 4.0 3.7 3.6 3.9  CL 103 107 110 113*  CO2 22 22 21* 21*  GLUCOSE 174* 101* 93 84  BUN 35* 32* 28* 23  CREATININE 1.46* 1.08 1.09 0.99  CALCIUM 9.1 8.4* 8.0* 8.0*  MG 2.1 2.1 2.2 2.1  PHOS  --  3.3 3.1 3.7   Liver Function Tests: Recent Labs  Lab 08/27/22 0915 08/28/22 0535 08/29/22 0419 08/30/22 0532  AST 51* 44* 37 27  ALT '21 18 18 18  '$ ALKPHOS 68 49 49 47  BILITOT 1.5* 1.2 0.9 0.6  PROT 6.9 5.7* 5.3* 5.2*  ALBUMIN 3.8 3.0* 2.7* 2.7*   CBG: Recent Labs  Lab 09/01/22 0721 09/01/22 1157 09/01/22 1619 09/01/22 2144 09/02/22 0724  GLUCAP 104* 120* 112* 124* 111*    Discharge time spent: greater than 30 minutes.  Signed: Barton Dubois, MD [facility] 09/02/2022

## 2022-09-02 NOTE — Progress Notes (Addendum)
11am: CSW spoke with patient's sister Clarise Cruz who confirms she will transport the patient. Clarise Cruz states she is currently at church and will be available to transport the patient after 12:30pm.  CSW will call Clarise Cruz once discharge orders and summary are complete.  10am: CSW spoke with Ebony Hail at [facility] who states the patient can be accepted at the facility today.  CSW attempted to reach patient's sister twice via phone without success - a voicemail was left requesting a return call.  Patient will go to room #101. The number to call for report is [phone]. Facility staff will not accept report until the discharge summary has been sent . Once available CSW will send summary via the hub for review.  Madilyn Fireman, MSW, LCSW Transitions of Care  Clinical Social Worker II [phone]

## 2022-09-02 NOTE — Progress Notes (Signed)
[facility] Discharge Note  Admit Date:  08/27/2022 Discharge date: 09/02/2022   Vick Frees to be D/C'd Home per MD order.  AVS completed. Patient/caregiver able to verbalize understanding.  Discharge Medication: Allergies as of 09/02/2022       Reactions   Lipitor [atorvastatin]    Pain in neck and legs after taking medication        Medication List     STOP taking these medications    chlorhexidine 4 % external liquid Commonly known as: Hibiclens   mupirocin ointment 2 % Commonly known as: BACTROBAN   ticagrelor 90 MG Tabs tablet Commonly known as: BRILINTA       TAKE these medications    ascorbic acid 500 MG tablet Commonly known as: VITAMIN C Take 1 tablet (500 mg total) by mouth daily. Start taking on: September 03, 2022   aspirin EC 81 MG tablet Take 1 tablet (81 mg total) by mouth daily. Swallow whole.   carvedilol 3.125 MG tablet Commonly known as: COREG Take 1 tablet (3.125 mg total) by mouth 2 (two) times daily. What changed: when to take this   citalopram 40 MG tablet Commonly known as: CELEXA Take 0.5 tablets (20 mg total) by mouth daily.   clopidogrel 75 MG tablet Commonly known as: PLAVIX Take 1 tablet (75 mg total) by mouth daily.   fish oil-omega-3 fatty acids 1000 MG capsule Take 1 g by mouth daily.   losartan 50 MG tablet Commonly known as: COZAAR Take 0.5 tablets (25 mg total) by mouth in the morning and at bedtime.   metFORMIN 500 MG 24 hr tablet Commonly known as: GLUCOPHAGE-XR Take 1 tablet by mouth daily.   nitroGLYCERIN 0.4 MG SL tablet Commonly known as: NITROSTAT DISSOLVE ONE TABLET UNDER THE TONGUE EVERY 5 MINUTES AS NEEDED FOR CHEST PAIN.  DO NOT EXCEED A TOTAL OF 3 DOSES IN 15 MINUTES   oxyCODONE 5 MG immediate release tablet Commonly known as: Oxy IR/ROXICODONE Take 1 tablet (5 mg total) by mouth every 8 (eight) hours as needed for severe pain.   potassium chloride SA 20 MEQ tablet Commonly known as: KLOR-CON  M Take 20 mEq by mouth daily.   rosuvastatin 20 MG tablet Commonly known as: CRESTOR Take 1 tablet (20 mg total) by mouth daily.   temazepam 7.5 MG capsule Commonly known as: RESTORIL Take 1 capsule (7.5 mg total) by mouth at bedtime as needed for sleep.   Vitamin D3 25 MCG (1000 UT) Caps Take 1 capsule by mouth daily.   zinc sulfate 220 (50 Zn) MG capsule Take 1 capsule (220 mg total) by mouth daily. Start taking on: September 03, 2022        Discharge Assessment: Vitals:   09/01/22 2338 09/02/22 0609  BP: (!) 168/93 (!) 164/107  Pulse:  79  Resp:  14  Temp:  98 F (36.7 C)  SpO2:  99%   Skin clean, dry and intact without evidence of skin break down, no evidence of skin tears noted. IV catheter discontinued intact. Site without signs and symptoms of complications - no redness or edema noted at insertion site, patient denies c/o pain - only slight tenderness at site.  Dressing with slight pressure applied.  D/c Instructions-Education: Discharge instructions given to patient/family with verbalized understanding. D/c education completed with patient/family including follow up instructions, medication list, d/c activities limitations if indicated, with other d/c instructions as indicated by MD - patient able to verbalize understanding, all questions fully answered. Patient instructed  to return to ED, call 911, or call MD for any changes in condition.  Patient escorted via [place], and D/C [facility] via private auto.  Richrd Prime, LPN  [id number]/50/[id number] 3:58 PM

## 2022-09-03 DIAGNOSIS — F339 Major depressive disorder, recurrent, unspecified: Secondary | ICD-10-CM | POA: Diagnosis not present

## 2022-09-03 DIAGNOSIS — M6281 Muscle weakness (generalized): Secondary | ICD-10-CM | POA: Diagnosis not present

## 2022-09-03 DIAGNOSIS — Z955 Presence of coronary angioplasty implant and graft: Secondary | ICD-10-CM | POA: Diagnosis not present

## 2022-09-03 DIAGNOSIS — S2231XD Fracture of one rib, right side, subsequent encounter for fracture with routine healing: Secondary | ICD-10-CM | POA: Diagnosis not present

## 2022-09-03 DIAGNOSIS — Z9181 History of falling: Secondary | ICD-10-CM | POA: Diagnosis not present

## 2022-09-03 DIAGNOSIS — E119 Type 2 diabetes mellitus without complications: Secondary | ICD-10-CM | POA: Diagnosis not present

## 2022-09-03 DIAGNOSIS — Z8616 Personal history of COVID-19: Secondary | ICD-10-CM | POA: Diagnosis not present

## 2022-09-03 DIAGNOSIS — I2510 Atherosclerotic heart disease of native coronary artery without angina pectoris: Secondary | ICD-10-CM | POA: Diagnosis not present

## 2022-09-03 DIAGNOSIS — I10 Essential (primary) hypertension: Secondary | ICD-10-CM | POA: Diagnosis not present

## 2022-09-04 DIAGNOSIS — I10 Essential (primary) hypertension: Secondary | ICD-10-CM | POA: Diagnosis not present

## 2022-09-04 DIAGNOSIS — E559 Vitamin D deficiency, unspecified: Secondary | ICD-10-CM | POA: Diagnosis not present

## 2022-09-04 DIAGNOSIS — E119 Type 2 diabetes mellitus without complications: Secondary | ICD-10-CM | POA: Diagnosis not present

## 2022-09-10 DIAGNOSIS — F339 Major depressive disorder, recurrent, unspecified: Secondary | ICD-10-CM | POA: Diagnosis not present

## 2022-09-10 DIAGNOSIS — I10 Essential (primary) hypertension: Secondary | ICD-10-CM | POA: Diagnosis not present

## 2022-09-10 DIAGNOSIS — E119 Type 2 diabetes mellitus without complications: Secondary | ICD-10-CM | POA: Diagnosis not present

## 2022-09-10 DIAGNOSIS — I2510 Atherosclerotic heart disease of native coronary artery without angina pectoris: Secondary | ICD-10-CM | POA: Diagnosis not present

## 2022-09-11 ENCOUNTER — Other Ambulatory Visit: Payer: Self-pay | Admitting: *Deleted

## 2022-09-11 DIAGNOSIS — S2231XA Fracture of one rib, right side, initial encounter for closed fracture: Secondary | ICD-10-CM | POA: Diagnosis not present

## 2022-09-11 DIAGNOSIS — I2510 Atherosclerotic heart disease of native coronary artery without angina pectoris: Secondary | ICD-10-CM | POA: Diagnosis not present

## 2022-09-11 DIAGNOSIS — M6282 Rhabdomyolysis: Secondary | ICD-10-CM | POA: Diagnosis not present

## 2022-09-11 NOTE — Patient Outreach (Signed)
Per [place] Mr. Kleinert resides in [place]. Screening for potential [facility] care coordination services as benefit of insurance plan and PCP.   Secure communication sent to SNF social worker to inquire about transition plans/needs.   Will continue to follow.    Marthenia Rolling, MSN, RN,BSN [place] Acute Care Coordinator [phone] (Direct dial)

## 2022-09-12 DIAGNOSIS — F331 Major depressive disorder, recurrent, moderate: Secondary | ICD-10-CM | POA: Diagnosis not present

## 2022-09-12 DIAGNOSIS — G4700 Insomnia, unspecified: Secondary | ICD-10-CM | POA: Diagnosis not present

## 2022-09-12 DIAGNOSIS — R69 Illness, unspecified: Secondary | ICD-10-CM | POA: Diagnosis not present

## 2022-09-14 DIAGNOSIS — Z9181 History of falling: Secondary | ICD-10-CM | POA: Diagnosis not present

## 2022-09-14 DIAGNOSIS — Z7902 Long term (current) use of antithrombotics/antiplatelets: Secondary | ICD-10-CM | POA: Diagnosis not present

## 2022-09-14 DIAGNOSIS — Z8616 Personal history of COVID-19: Secondary | ICD-10-CM | POA: Diagnosis not present

## 2022-09-14 DIAGNOSIS — M6282 Rhabdomyolysis: Secondary | ICD-10-CM | POA: Diagnosis not present

## 2022-09-14 DIAGNOSIS — I2510 Atherosclerotic heart disease of native coronary artery without angina pectoris: Secondary | ICD-10-CM | POA: Diagnosis not present

## 2022-09-14 DIAGNOSIS — D631 Anemia in chronic kidney disease: Secondary | ICD-10-CM | POA: Diagnosis not present

## 2022-09-14 DIAGNOSIS — E119 Type 2 diabetes mellitus without complications: Secondary | ICD-10-CM | POA: Diagnosis not present

## 2022-09-14 DIAGNOSIS — S2231XA Fracture of one rib, right side, initial encounter for closed fracture: Secondary | ICD-10-CM | POA: Diagnosis not present

## 2022-09-14 DIAGNOSIS — E1122 Type 2 diabetes mellitus with diabetic chronic kidney disease: Secondary | ICD-10-CM | POA: Diagnosis not present

## 2022-09-14 DIAGNOSIS — Z556 Problems related to health literacy: Secondary | ICD-10-CM | POA: Diagnosis not present

## 2022-09-14 DIAGNOSIS — G319 Degenerative disease of nervous system, unspecified: Secondary | ICD-10-CM | POA: Diagnosis not present

## 2022-09-14 DIAGNOSIS — R69 Illness, unspecified: Secondary | ICD-10-CM | POA: Diagnosis not present

## 2022-09-14 DIAGNOSIS — M6281 Muscle weakness (generalized): Secondary | ICD-10-CM | POA: Diagnosis not present

## 2022-09-14 DIAGNOSIS — S2231XD Fracture of one rib, right side, subsequent encounter for fracture with routine healing: Secondary | ICD-10-CM | POA: Diagnosis not present

## 2022-09-14 DIAGNOSIS — I129 Hypertensive chronic kidney disease with stage 1 through stage 4 chronic kidney disease, or unspecified chronic kidney disease: Secondary | ICD-10-CM | POA: Diagnosis not present

## 2022-09-14 DIAGNOSIS — E860 Dehydration: Secondary | ICD-10-CM | POA: Diagnosis not present

## 2022-09-14 DIAGNOSIS — F339 Major depressive disorder, recurrent, unspecified: Secondary | ICD-10-CM | POA: Diagnosis not present

## 2022-09-14 DIAGNOSIS — Z7984 Long term (current) use of oral hypoglycemic drugs: Secondary | ICD-10-CM | POA: Diagnosis not present

## 2022-09-14 DIAGNOSIS — N1831 Chronic kidney disease, stage 3a: Secondary | ICD-10-CM | POA: Diagnosis not present

## 2022-09-19 DIAGNOSIS — Z112 Encounter for screening for other bacterial diseases: Secondary | ICD-10-CM | POA: Diagnosis not present

## 2022-09-26 DIAGNOSIS — F339 Major depressive disorder, recurrent, unspecified: Secondary | ICD-10-CM | POA: Diagnosis not present

## 2022-09-26 DIAGNOSIS — I2510 Atherosclerotic heart disease of native coronary artery without angina pectoris: Secondary | ICD-10-CM | POA: Diagnosis not present

## 2022-09-26 DIAGNOSIS — N1831 Chronic kidney disease, stage 3a: Secondary | ICD-10-CM | POA: Diagnosis not present

## 2022-09-26 DIAGNOSIS — E1122 Type 2 diabetes mellitus with diabetic chronic kidney disease: Secondary | ICD-10-CM | POA: Diagnosis not present

## 2022-09-26 DIAGNOSIS — S2231XD Fracture of one rib, right side, subsequent encounter for fracture with routine healing: Secondary | ICD-10-CM | POA: Diagnosis not present

## 2022-09-26 DIAGNOSIS — M6281 Muscle weakness (generalized): Secondary | ICD-10-CM | POA: Diagnosis not present

## 2022-09-26 DIAGNOSIS — Z7902 Long term (current) use of antithrombotics/antiplatelets: Secondary | ICD-10-CM | POA: Diagnosis not present

## 2022-09-26 DIAGNOSIS — I129 Hypertensive chronic kidney disease with stage 1 through stage 4 chronic kidney disease, or unspecified chronic kidney disease: Secondary | ICD-10-CM | POA: Diagnosis not present

## 2022-09-26 DIAGNOSIS — Z556 Problems related to health literacy: Secondary | ICD-10-CM | POA: Diagnosis not present

## 2022-09-26 DIAGNOSIS — E860 Dehydration: Secondary | ICD-10-CM | POA: Diagnosis not present

## 2022-09-26 DIAGNOSIS — M6282 Rhabdomyolysis: Secondary | ICD-10-CM | POA: Diagnosis not present

## 2022-09-26 DIAGNOSIS — Z8616 Personal history of COVID-19: Secondary | ICD-10-CM | POA: Diagnosis not present

## 2022-09-26 DIAGNOSIS — G319 Degenerative disease of nervous system, unspecified: Secondary | ICD-10-CM | POA: Diagnosis not present

## 2022-09-26 DIAGNOSIS — D631 Anemia in chronic kidney disease: Secondary | ICD-10-CM | POA: Diagnosis not present

## 2022-09-26 DIAGNOSIS — Z9181 History of falling: Secondary | ICD-10-CM | POA: Diagnosis not present

## 2022-09-26 DIAGNOSIS — Z7984 Long term (current) use of oral hypoglycemic drugs: Secondary | ICD-10-CM | POA: Diagnosis not present

## 2022-09-26 DIAGNOSIS — R69 Illness, unspecified: Secondary | ICD-10-CM | POA: Diagnosis not present

## 2022-09-27 DIAGNOSIS — Z556 Problems related to health literacy: Secondary | ICD-10-CM | POA: Diagnosis not present

## 2022-09-27 DIAGNOSIS — Z8616 Personal history of COVID-19: Secondary | ICD-10-CM | POA: Diagnosis not present

## 2022-09-27 DIAGNOSIS — I2510 Atherosclerotic heart disease of native coronary artery without angina pectoris: Secondary | ICD-10-CM | POA: Diagnosis not present

## 2022-09-27 DIAGNOSIS — R69 Illness, unspecified: Secondary | ICD-10-CM | POA: Diagnosis not present

## 2022-09-27 DIAGNOSIS — F339 Major depressive disorder, recurrent, unspecified: Secondary | ICD-10-CM | POA: Diagnosis not present

## 2022-09-27 DIAGNOSIS — Z7984 Long term (current) use of oral hypoglycemic drugs: Secondary | ICD-10-CM | POA: Diagnosis not present

## 2022-09-27 DIAGNOSIS — N1831 Chronic kidney disease, stage 3a: Secondary | ICD-10-CM | POA: Diagnosis not present

## 2022-09-27 DIAGNOSIS — E1122 Type 2 diabetes mellitus with diabetic chronic kidney disease: Secondary | ICD-10-CM | POA: Diagnosis not present

## 2022-09-27 DIAGNOSIS — M6281 Muscle weakness (generalized): Secondary | ICD-10-CM | POA: Diagnosis not present

## 2022-09-27 DIAGNOSIS — E860 Dehydration: Secondary | ICD-10-CM | POA: Diagnosis not present

## 2022-09-27 DIAGNOSIS — G319 Degenerative disease of nervous system, unspecified: Secondary | ICD-10-CM | POA: Diagnosis not present

## 2022-09-27 DIAGNOSIS — M6282 Rhabdomyolysis: Secondary | ICD-10-CM | POA: Diagnosis not present

## 2022-09-27 DIAGNOSIS — Z9181 History of falling: Secondary | ICD-10-CM | POA: Diagnosis not present

## 2022-09-27 DIAGNOSIS — D631 Anemia in chronic kidney disease: Secondary | ICD-10-CM | POA: Diagnosis not present

## 2022-09-27 DIAGNOSIS — S2231XD Fracture of one rib, right side, subsequent encounter for fracture with routine healing: Secondary | ICD-10-CM | POA: Diagnosis not present

## 2022-09-27 DIAGNOSIS — Z7902 Long term (current) use of antithrombotics/antiplatelets: Secondary | ICD-10-CM | POA: Diagnosis not present

## 2022-09-27 DIAGNOSIS — I129 Hypertensive chronic kidney disease with stage 1 through stage 4 chronic kidney disease, or unspecified chronic kidney disease: Secondary | ICD-10-CM | POA: Diagnosis not present

## 2022-10-01 DIAGNOSIS — G319 Degenerative disease of nervous system, unspecified: Secondary | ICD-10-CM | POA: Diagnosis not present

## 2022-10-01 DIAGNOSIS — M6282 Rhabdomyolysis: Secondary | ICD-10-CM | POA: Diagnosis not present

## 2022-10-01 DIAGNOSIS — E860 Dehydration: Secondary | ICD-10-CM | POA: Diagnosis not present

## 2022-10-01 DIAGNOSIS — M6281 Muscle weakness (generalized): Secondary | ICD-10-CM | POA: Diagnosis not present

## 2022-10-01 DIAGNOSIS — S2231XD Fracture of one rib, right side, subsequent encounter for fracture with routine healing: Secondary | ICD-10-CM | POA: Diagnosis not present

## 2022-10-01 DIAGNOSIS — N1831 Chronic kidney disease, stage 3a: Secondary | ICD-10-CM | POA: Diagnosis not present

## 2022-10-01 DIAGNOSIS — E1122 Type 2 diabetes mellitus with diabetic chronic kidney disease: Secondary | ICD-10-CM | POA: Diagnosis not present

## 2022-10-01 DIAGNOSIS — Z9181 History of falling: Secondary | ICD-10-CM | POA: Diagnosis not present

## 2022-10-01 DIAGNOSIS — Z7984 Long term (current) use of oral hypoglycemic drugs: Secondary | ICD-10-CM | POA: Diagnosis not present

## 2022-10-01 DIAGNOSIS — Z556 Problems related to health literacy: Secondary | ICD-10-CM | POA: Diagnosis not present

## 2022-10-01 DIAGNOSIS — I129 Hypertensive chronic kidney disease with stage 1 through stage 4 chronic kidney disease, or unspecified chronic kidney disease: Secondary | ICD-10-CM | POA: Diagnosis not present

## 2022-10-01 DIAGNOSIS — F339 Major depressive disorder, recurrent, unspecified: Secondary | ICD-10-CM | POA: Diagnosis not present

## 2022-10-01 DIAGNOSIS — Z7902 Long term (current) use of antithrombotics/antiplatelets: Secondary | ICD-10-CM | POA: Diagnosis not present

## 2022-10-01 DIAGNOSIS — D631 Anemia in chronic kidney disease: Secondary | ICD-10-CM | POA: Diagnosis not present

## 2022-10-01 DIAGNOSIS — Z8616 Personal history of COVID-19: Secondary | ICD-10-CM | POA: Diagnosis not present

## 2022-10-01 DIAGNOSIS — R69 Illness, unspecified: Secondary | ICD-10-CM | POA: Diagnosis not present

## 2022-10-01 DIAGNOSIS — I2510 Atherosclerotic heart disease of native coronary artery without angina pectoris: Secondary | ICD-10-CM | POA: Diagnosis not present

## 2022-10-03 DIAGNOSIS — Z7902 Long term (current) use of antithrombotics/antiplatelets: Secondary | ICD-10-CM | POA: Diagnosis not present

## 2022-10-03 DIAGNOSIS — S2231XD Fracture of one rib, right side, subsequent encounter for fracture with routine healing: Secondary | ICD-10-CM | POA: Diagnosis not present

## 2022-10-03 DIAGNOSIS — E860 Dehydration: Secondary | ICD-10-CM | POA: Diagnosis not present

## 2022-10-03 DIAGNOSIS — I129 Hypertensive chronic kidney disease with stage 1 through stage 4 chronic kidney disease, or unspecified chronic kidney disease: Secondary | ICD-10-CM | POA: Diagnosis not present

## 2022-10-03 DIAGNOSIS — M6281 Muscle weakness (generalized): Secondary | ICD-10-CM | POA: Diagnosis not present

## 2022-10-03 DIAGNOSIS — E1122 Type 2 diabetes mellitus with diabetic chronic kidney disease: Secondary | ICD-10-CM | POA: Diagnosis not present

## 2022-10-03 DIAGNOSIS — D631 Anemia in chronic kidney disease: Secondary | ICD-10-CM | POA: Diagnosis not present

## 2022-10-03 DIAGNOSIS — M6282 Rhabdomyolysis: Secondary | ICD-10-CM | POA: Diagnosis not present

## 2022-10-03 DIAGNOSIS — Z8616 Personal history of COVID-19: Secondary | ICD-10-CM | POA: Diagnosis not present

## 2022-10-03 DIAGNOSIS — G319 Degenerative disease of nervous system, unspecified: Secondary | ICD-10-CM | POA: Diagnosis not present

## 2022-10-03 DIAGNOSIS — Z7984 Long term (current) use of oral hypoglycemic drugs: Secondary | ICD-10-CM | POA: Diagnosis not present

## 2022-10-03 DIAGNOSIS — N1831 Chronic kidney disease, stage 3a: Secondary | ICD-10-CM | POA: Diagnosis not present

## 2022-10-03 DIAGNOSIS — I2510 Atherosclerotic heart disease of native coronary artery without angina pectoris: Secondary | ICD-10-CM | POA: Diagnosis not present

## 2022-10-03 DIAGNOSIS — F339 Major depressive disorder, recurrent, unspecified: Secondary | ICD-10-CM | POA: Diagnosis not present

## 2022-10-03 DIAGNOSIS — Z9181 History of falling: Secondary | ICD-10-CM | POA: Diagnosis not present

## 2022-10-03 DIAGNOSIS — Z556 Problems related to health literacy: Secondary | ICD-10-CM | POA: Diagnosis not present

## 2022-10-03 DIAGNOSIS — R69 Illness, unspecified: Secondary | ICD-10-CM | POA: Diagnosis not present

## 2022-10-04 DIAGNOSIS — Z6829 Body mass index (BMI) 29.0-29.9, adult: Secondary | ICD-10-CM | POA: Diagnosis not present

## 2022-10-04 DIAGNOSIS — E663 Overweight: Secondary | ICD-10-CM | POA: Diagnosis not present

## 2022-10-04 DIAGNOSIS — Z0001 Encounter for general adult medical examination with abnormal findings: Secondary | ICD-10-CM | POA: Diagnosis not present

## 2022-10-04 DIAGNOSIS — I25118 Atherosclerotic heart disease of native coronary artery with other forms of angina pectoris: Secondary | ICD-10-CM | POA: Diagnosis not present

## 2022-10-04 DIAGNOSIS — Z1331 Encounter for screening for depression: Secondary | ICD-10-CM | POA: Diagnosis not present

## 2022-10-04 DIAGNOSIS — E119 Type 2 diabetes mellitus without complications: Secondary | ICD-10-CM | POA: Diagnosis not present

## 2022-10-04 DIAGNOSIS — I10 Essential (primary) hypertension: Secondary | ICD-10-CM | POA: Diagnosis not present

## 2022-10-04 DIAGNOSIS — R69 Illness, unspecified: Secondary | ICD-10-CM | POA: Diagnosis not present

## 2022-10-04 DIAGNOSIS — F329 Major depressive disorder, single episode, unspecified: Secondary | ICD-10-CM | POA: Diagnosis not present

## 2022-10-08 DIAGNOSIS — E1122 Type 2 diabetes mellitus with diabetic chronic kidney disease: Secondary | ICD-10-CM | POA: Diagnosis not present

## 2022-10-08 DIAGNOSIS — N1831 Chronic kidney disease, stage 3a: Secondary | ICD-10-CM | POA: Diagnosis not present

## 2022-10-08 DIAGNOSIS — Z7902 Long term (current) use of antithrombotics/antiplatelets: Secondary | ICD-10-CM | POA: Diagnosis not present

## 2022-10-08 DIAGNOSIS — I129 Hypertensive chronic kidney disease with stage 1 through stage 4 chronic kidney disease, or unspecified chronic kidney disease: Secondary | ICD-10-CM | POA: Diagnosis not present

## 2022-10-08 DIAGNOSIS — Z8616 Personal history of COVID-19: Secondary | ICD-10-CM | POA: Diagnosis not present

## 2022-10-08 DIAGNOSIS — D631 Anemia in chronic kidney disease: Secondary | ICD-10-CM | POA: Diagnosis not present

## 2022-10-08 DIAGNOSIS — I2510 Atherosclerotic heart disease of native coronary artery without angina pectoris: Secondary | ICD-10-CM | POA: Diagnosis not present

## 2022-10-08 DIAGNOSIS — R69 Illness, unspecified: Secondary | ICD-10-CM | POA: Diagnosis not present

## 2022-10-08 DIAGNOSIS — F339 Major depressive disorder, recurrent, unspecified: Secondary | ICD-10-CM | POA: Diagnosis not present

## 2022-10-08 DIAGNOSIS — M6281 Muscle weakness (generalized): Secondary | ICD-10-CM | POA: Diagnosis not present

## 2022-10-08 DIAGNOSIS — E860 Dehydration: Secondary | ICD-10-CM | POA: Diagnosis not present

## 2022-10-08 DIAGNOSIS — S2231XD Fracture of one rib, right side, subsequent encounter for fracture with routine healing: Secondary | ICD-10-CM | POA: Diagnosis not present

## 2022-10-08 DIAGNOSIS — Z556 Problems related to health literacy: Secondary | ICD-10-CM | POA: Diagnosis not present

## 2022-10-08 DIAGNOSIS — Z7984 Long term (current) use of oral hypoglycemic drugs: Secondary | ICD-10-CM | POA: Diagnosis not present

## 2022-10-08 DIAGNOSIS — G319 Degenerative disease of nervous system, unspecified: Secondary | ICD-10-CM | POA: Diagnosis not present

## 2022-10-08 DIAGNOSIS — M6282 Rhabdomyolysis: Secondary | ICD-10-CM | POA: Diagnosis not present

## 2022-10-08 DIAGNOSIS — Z9181 History of falling: Secondary | ICD-10-CM | POA: Diagnosis not present

## 2022-10-10 DIAGNOSIS — N1831 Chronic kidney disease, stage 3a: Secondary | ICD-10-CM | POA: Diagnosis not present

## 2022-10-10 DIAGNOSIS — S2231XD Fracture of one rib, right side, subsequent encounter for fracture with routine healing: Secondary | ICD-10-CM | POA: Diagnosis not present

## 2022-10-10 DIAGNOSIS — Z9181 History of falling: Secondary | ICD-10-CM | POA: Diagnosis not present

## 2022-10-10 DIAGNOSIS — Z556 Problems related to health literacy: Secondary | ICD-10-CM | POA: Diagnosis not present

## 2022-10-10 DIAGNOSIS — Z8616 Personal history of COVID-19: Secondary | ICD-10-CM | POA: Diagnosis not present

## 2022-10-10 DIAGNOSIS — I2510 Atherosclerotic heart disease of native coronary artery without angina pectoris: Secondary | ICD-10-CM | POA: Diagnosis not present

## 2022-10-10 DIAGNOSIS — Z7902 Long term (current) use of antithrombotics/antiplatelets: Secondary | ICD-10-CM | POA: Diagnosis not present

## 2022-10-10 DIAGNOSIS — R69 Illness, unspecified: Secondary | ICD-10-CM | POA: Diagnosis not present

## 2022-10-10 DIAGNOSIS — E860 Dehydration: Secondary | ICD-10-CM | POA: Diagnosis not present

## 2022-10-10 DIAGNOSIS — M6282 Rhabdomyolysis: Secondary | ICD-10-CM | POA: Diagnosis not present

## 2022-10-10 DIAGNOSIS — I129 Hypertensive chronic kidney disease with stage 1 through stage 4 chronic kidney disease, or unspecified chronic kidney disease: Secondary | ICD-10-CM | POA: Diagnosis not present

## 2022-10-10 DIAGNOSIS — D631 Anemia in chronic kidney disease: Secondary | ICD-10-CM | POA: Diagnosis not present

## 2022-10-10 DIAGNOSIS — E1122 Type 2 diabetes mellitus with diabetic chronic kidney disease: Secondary | ICD-10-CM | POA: Diagnosis not present

## 2022-10-10 DIAGNOSIS — F339 Major depressive disorder, recurrent, unspecified: Secondary | ICD-10-CM | POA: Diagnosis not present

## 2022-10-10 DIAGNOSIS — G319 Degenerative disease of nervous system, unspecified: Secondary | ICD-10-CM | POA: Diagnosis not present

## 2022-10-10 DIAGNOSIS — M6281 Muscle weakness (generalized): Secondary | ICD-10-CM | POA: Diagnosis not present

## 2022-10-10 DIAGNOSIS — Z7984 Long term (current) use of oral hypoglycemic drugs: Secondary | ICD-10-CM | POA: Diagnosis not present

## 2022-10-16 DIAGNOSIS — Z9181 History of falling: Secondary | ICD-10-CM | POA: Diagnosis not present

## 2022-10-16 DIAGNOSIS — M6281 Muscle weakness (generalized): Secondary | ICD-10-CM | POA: Diagnosis not present

## 2022-10-16 DIAGNOSIS — Z7902 Long term (current) use of antithrombotics/antiplatelets: Secondary | ICD-10-CM | POA: Diagnosis not present

## 2022-10-16 DIAGNOSIS — Z556 Problems related to health literacy: Secondary | ICD-10-CM | POA: Diagnosis not present

## 2022-10-16 DIAGNOSIS — Z7984 Long term (current) use of oral hypoglycemic drugs: Secondary | ICD-10-CM | POA: Diagnosis not present

## 2022-10-16 DIAGNOSIS — D631 Anemia in chronic kidney disease: Secondary | ICD-10-CM | POA: Diagnosis not present

## 2022-10-16 DIAGNOSIS — E1122 Type 2 diabetes mellitus with diabetic chronic kidney disease: Secondary | ICD-10-CM | POA: Diagnosis not present

## 2022-10-16 DIAGNOSIS — F339 Major depressive disorder, recurrent, unspecified: Secondary | ICD-10-CM | POA: Diagnosis not present

## 2022-10-16 DIAGNOSIS — I129 Hypertensive chronic kidney disease with stage 1 through stage 4 chronic kidney disease, or unspecified chronic kidney disease: Secondary | ICD-10-CM | POA: Diagnosis not present

## 2022-10-16 DIAGNOSIS — E860 Dehydration: Secondary | ICD-10-CM | POA: Diagnosis not present

## 2022-10-16 DIAGNOSIS — S2231XD Fracture of one rib, right side, subsequent encounter for fracture with routine healing: Secondary | ICD-10-CM | POA: Diagnosis not present

## 2022-10-16 DIAGNOSIS — I2510 Atherosclerotic heart disease of native coronary artery without angina pectoris: Secondary | ICD-10-CM | POA: Diagnosis not present

## 2022-10-16 DIAGNOSIS — N1831 Chronic kidney disease, stage 3a: Secondary | ICD-10-CM | POA: Diagnosis not present

## 2022-10-16 DIAGNOSIS — G319 Degenerative disease of nervous system, unspecified: Secondary | ICD-10-CM | POA: Diagnosis not present

## 2022-10-16 DIAGNOSIS — R69 Illness, unspecified: Secondary | ICD-10-CM | POA: Diagnosis not present

## 2022-10-16 DIAGNOSIS — M6282 Rhabdomyolysis: Secondary | ICD-10-CM | POA: Diagnosis not present

## 2022-10-16 DIAGNOSIS — Z8616 Personal history of COVID-19: Secondary | ICD-10-CM | POA: Diagnosis not present

## 2022-12-03 ENCOUNTER — Encounter: Payer: Self-pay | Admitting: Cardiology

## 2022-12-03 ENCOUNTER — Ambulatory Visit: Payer: Medicare HMO | Attending: Cardiology | Admitting: Cardiology

## 2022-12-03 ENCOUNTER — Encounter: Payer: Self-pay | Admitting: *Deleted

## 2022-12-03 VITALS — BP 128/92 | HR 73 | Ht 71.75 in | Wt 217.2 lb

## 2022-12-03 DIAGNOSIS — I2510 Atherosclerotic heart disease of native coronary artery without angina pectoris: Secondary | ICD-10-CM | POA: Diagnosis not present

## 2022-12-03 DIAGNOSIS — I10 Essential (primary) hypertension: Secondary | ICD-10-CM

## 2022-12-03 DIAGNOSIS — I4891 Unspecified atrial fibrillation: Secondary | ICD-10-CM

## 2022-12-03 DIAGNOSIS — E782 Mixed hyperlipidemia: Secondary | ICD-10-CM | POA: Diagnosis not present

## 2022-12-03 MED ORDER — APIXABAN 5 MG PO TABS: 5.0000 mg | ORAL_TABLET | Freq: Two times a day (BID) | ORAL | 0 refills | Status: DC

## 2022-12-03 MED ORDER — APIXABAN 5 MG PO TABS: 5.0000 mg | ORAL_TABLET | Freq: Two times a day (BID) | ORAL | 6 refills | Status: DC

## 2022-12-03 NOTE — Patient Instructions (Signed)
Medication Instructions:  Stop Plavix, remain off of the Aspirin Begin Eliquis '5mg'$  twice a day   Continue all other medications.     Labwork: none  Testing/Procedures: none  Follow-Up: 4 months   Any Other Special Instructions Will Be Listed Below (If Applicable).   If you need a refill on your cardiac medications before your next appointment, please call your pharmacy.

## 2022-12-03 NOTE — Progress Notes (Signed)
Clinical Summary [name] is a 67 y.o.male seen today for follow up of the following medical problems.    1.CAD -prior DES to mid distal LAD in 06/2017, small septal Stephen Graves. DES to LAD . Presented with unstable angina.  05/2021 echo LVEF 60-65% - at 1 year interventional has recommended stopping ASA and continuing either low dose brillnita or plavix.   - no recent chest pains - compliant with meds, he is on plavix '75mg'$  daily as monotherpay.        2. HTN - medical therapy has been affected by chronic orthostatic symptoms.    - home bp's 130s/80s    3. Hyperlipidemia - he is on crestor.   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   5. Afib - new diagnosis today noted on EKG - denies any palpitations.   Past Medical History:  Diagnosis Date   Anxiety    Coronary artery disease involving native coronary 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 ([place]) 07/18/2017   Cardiac cath 1020 1418: Mid LAD to Dist LAD lesion, 99 %stenosed. - PCI with resolute DES (2.5 X 26 mm Resolute Onyx stent - 2.75 mm);ostial LAD 30%. Ostial ramus 40%. Proximal to mid LAD 50%. Normal LV function and normal LVEDP.    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 Swallow whole. (Patient not taking: Reported on 08/27/2022) 90 tablet 3   carvedilol (COREG) 3.125 MG tablet Take 1 tablet (3.125 mg total) by mouth 2 (two) times daily. (Patient taking differently: Take 3.125 mg by mouth 2 (two) times daily with a meal.)     Cholecalciferol (VITAMIN D3) 25 MCG  (1000 UT) CAPS Take 1 capsule by mouth daily.     citalopram (CELEXA) 40 MG tablet Take 0.5 tablets (20 mg total) by mouth daily. 30 tablet 11   clopidogrel (PLAVIX) 75 MG tablet Take 1 tablet (75 mg total) by mouth daily. 90 tablet 1   fish oil-omega-3 fatty acids 1000 MG capsule Take 1 g by mouth daily.     losartan (COZAAR) 50 MG tablet Take 0.5 tablets (25 mg total) by mouth in the morning and at bedtime. 90 tablet 3   metFORMIN (GLUCOPHAGE-XR) 500 MG 24 hr tablet Take 1 tablet by mouth daily.     nitroGLYCERIN (NITROSTAT) 0.4 MG SL tablet DISSOLVE ONE TABLET UNDER THE TONGUE EVERY 5 MINUTES AS NEEDED FOR CHEST PAIN.  DO NOT EXCEED A TOTAL OF 3 DOSES IN 15 MINUTES 25 tablet 2   oxyCODONE (OXY IR/ROXICODONE) 5 MG immediate release tablet Take 1 tablet (5 mg total) by mouth every 8 (eight) hours as needed for severe pain. 20 tablet 0   potassium chloride SA (K-DUR,KLOR-CON) 20 MEQ tablet Take 20 mEq by mouth daily.     rosuvastatin (CRESTOR) 20 MG tablet Take 1 tablet (20 mg total) by mouth daily. 90 tablet 1   temazepam (RESTORIL) 7.5 MG capsule Take 1 capsule (7.5  mg total) by mouth at bedtime as needed for sleep. 15 capsule 0   zinc sulfate 220 (50 Zn) MG capsule Take 1 capsule (220 mg total) by mouth daily. 30 capsule 1   No current facility-administered medications for this visit.     Past Surgical History:  Procedure Laterality Date   CORONARY STENT INTERVENTION N/A 07/17/2017   Procedure: CORONARY STENT INTERVENTION;  Surgeon: Stephen Hampshire, Graves;  Location: [facility];  Service: Cardiovascular;  Laterality: N/A;   CORONARY STENT INTERVENTION N/A 05/30/2021   Procedure: CORONARY STENT INTERVENTION;  Surgeon: Stephen Man, Graves;  Location: [facility];  Service: Cardiovascular;  Laterality: N/A;   FOOT SURGERY Left    KNEE ARTHROPLASTY Left 07/30/2018   Procedure: LEFT TOTAL KNEE ARTHROPLASTY WITH COMPUTER NAVIGATION;  Surgeon: Stephen Graves;  Location: [facility];   Service: Orthopedics;  Laterality: Left;  Needs RNFA   KNEE ARTHROPLASTY Right 11/02/2020   Procedure: COMPUTER ASSISTED TOTAL KNEE ARTHROPLASTY;  Surgeon: Stephen Graves;  Location: [facility];  Service: Orthopedics;  Laterality: Right;  18mn   LEFT HEART CATH AND CORONARY ANGIOGRAPHY N/A 07/17/2017   Procedure: LEFT HEART CATH AND CORONARY ANGIOGRAPHY;  Surgeon: Stephen Graves;  [facility];  Service: Cardiovascular;  Laterality: N/A;   LEFT HEART CATH AND CORONARY ANGIOGRAPHY N/A 05/30/2021   Procedure: LEFT HEART CATH AND CORONARY ANGIOGRAPHY;  Surgeon: Stephen Graves;  [facility];  Service: Cardiovascular;  Laterality: N/A;   left wrist surgery     SKIN GRAFT  1998   TONSILLECTOMY       Allergies  Allergen Reactions   Lipitor [Atorvastatin]     Pain in neck and legs after taking medication      Family History  Problem Relation Age of Onset   Stroke Father 762  Heart attack Father 685  CAD Mother    Prostate cancer Brother    Thyroid nodules Brother      Social History Stephen Graves about 34 years ago. His Graves Graves included cigarettes. He has never used smokeless tobacco. Stephen Graves.   Review of Systems CONSTITUTIONAL: No weight loss, fever, chills, weakness or fatigue.  HEENT: Eyes: No visual loss, blurred vision, double vision or yellow sclerae.No hearing loss, sneezing, congestion, runny nose or sore throat.  SKIN: No rash or itching.  CARDIOVASCULAR: per hpi RESPIRATORY: No shortness of breath, cough or sputum.  GASTROINTESTINAL: No anorexia, nausea, vomiting or diarrhea. No abdominal pain or blood.  GENITOURINARY: No burning on urination, no polyuria NEUROLOGICAL: No headache, dizziness, syncope, paralysis, ataxia, numbness or tingling in the extremities. No change in bowel or bladder control.  MUSCULOSKELETAL: No muscle, back pain, joint pain or  stiffness.  LYMPHATICS: No enlarged nodes. No history of splenectomy.  PSYCHIATRIC: No history of depression or anxiety.  ENDOCRINOLOGIC: No reports of sweating, cold or heat intolerance. No polyuria or polydipsia.  .[name]  Physical Examination Today's Vitals   12/03/22 0940  BP: (!) 128/92  Pulse: 73  SpO2: 97%  Weight: 217 lb 3.2 oz (98.5 kg)  Height: 5' 11.75" (1.822 m)   Body mass index is 29.66 kg/m.  Gen: resting comfortably, no acute distress HEENT: no scleral icterus, pupils equal round and reactive, no palptable cervical adenopathy,  CV: RRR, no m/rg, no jvd Resp: Clear to auscultation bilaterally GI: abdomen is soft, non-tender, non-distended,  normal bowel sounds, no hepatosplenomegaly MSK: extremities are warm, no edema.  Skin: warm, no rash Neuro:  no focal deficits Psych: appropriate affect   Diagnostic Studies  Echocardiogram: 06/2017 Study Conclusions   - Left ventricle: The cavity size was normal. Wall thickness was    increased in a pattern of mild LVH. Systolic function was normal.    The estimated ejection fraction was in the range of 60% to 65%.    Wall motion was normal; there were no regional wall motion    abnormalities. Diastolic dysfunction, grade indeterminate.  - Aortic valve: Moderately calcified annulus. Trileaflet.    Cardiac Catheterization: 06/2017 The left ventricular systolic function is normal. LV end diastolic pressure is normal. The left ventricular ejection fraction is 55-65% by visual estimate. Ost LAD lesion, 30 %stenosed. Ost Ramus to Ramus lesion, 40 %stenosed. Prox LAD to Mid LAD lesion, 50 %stenosed. A drug eluting stent was successfully placed. Mid LAD to Dist LAD lesion, 99 %stenosed. Post intervention, there is a 0% residual stenosis.   1.  Severe one-vessel coronary artery disease with 99% stenosis in the mid to distal LAD. 2.  Normal LV systolic function and normal left ventricular end-diastolic pressure. 3.   Successful angioplasty and drug-eluting stent placement to the mid/distal LAD.  A small septal Aylene Acoff was jailed by the stent with sluggish flow.  This resulted in chest pain and was treated with nitroglycerin drip.   Post cath Recommendations: Dual antiplatelet therapy for at least one year.  Aggressive treatment of risk factors.  Continue nitroglycerin drip as needed for chest pain.  08/2022 echo 1. Left ventricular ejection fraction, by estimation, is 60 to 65%. The  left ventricle has normal function. The left ventricle has no regional  wall motion abnormalities. There is severe left ventricular hypertrophy.  Left ventricular diastolic parameters   are indeterminate.   2. Right ventricular systolic function is normal. The right ventricular  size is normal. Tricuspid regurgitation signal is inadequate for assessing  PA pressure.   3. A small pericardial effusion is present. The pericardial effusion is  circumferential.   4. The mitral valve is normal in structure. No evidence of mitral valve  regurgitation. No evidence of mitral stenosis.   5. The aortic valve is tricuspid. Aortic valve regurgitation is not  visualized. No aortic stenosis is present.   6. Aortic dilatation noted. There is mild dilatation of the aortic root,  measuring 40 mm. There is mild dilatation of the ascending aorta,  measuring 41 mm.   Assessment and Plan   1.CAD -05/30/22 will mark one year from stent. Due to length of stenting in LAD interventional recommended either plavix or low dose brillinta as single agent. - on plavix, with new diagnosis of afib today will d/c plavix and start eliquis - no symptoms, continue other meds   2. Hyperlipidemia - at goal, continue current meds     3. HTN - ongoing issues with orthostatic dizziness, recently has been mild - continue current meds, home bp's are at goal.    4. Afib - new diagnosis today in clinic by EKG - no symptoms - EKG shows rate controlled  afib - continue coreg. CHADS2Vasc score is 3, start eliquis '5mg'$  bid - already had recent echo - rate controlled on low dose beta blocker and completely asymptomatic, do not see an indication to attempt restoration of SR at this time.     Arnoldo Lenis, M.D.

## 2022-12-04 ENCOUNTER — Telehealth: Payer: Self-pay | Admitting: Cardiology

## 2022-12-04 NOTE — Telephone Encounter (Signed)
Patient is calling again requesting to speak with nurse. Requesting return call.

## 2022-12-04 NOTE — Telephone Encounter (Signed)
Does he qualify for any type of patient assistance?  If not and he can not afford it message will need to go to Dr Harl Bowie to decide what other medication he recommends for patient.  Thanks

## 2022-12-04 NOTE — Telephone Encounter (Signed)
Patient calling back to let us know that 90 day supply will be $141.90.    Mailing info on LIS as requested - also sent copy to sister Herbert Deaner) at his request.

## 2022-12-04 NOTE — Telephone Encounter (Signed)
Left message to return call 

## 2022-12-04 NOTE — Telephone Encounter (Signed)
Patient called again to get next steps regarding his Eliquis medication.

## 2022-12-04 NOTE — Telephone Encounter (Signed)
Pt c/o medication issue:  1. Name of Medication:   apixaban (ELIQUIS) 5 MG TABS tablet    2. How are you currently taking this medication (dosage and times per day)?    3. Are you having a reaction (difficulty breathing--STAT)? No   4. What is your medication issue? Called to say that the medication is too expensive. Please advise

## 2022-12-04 NOTE — Telephone Encounter (Signed)
Spoke with patient in regards to his Eliquis.  Stated he picked up the initial fill yesterday & got the 1 month free.  Pharmacy says his co-pay will be $47.00 per month.  Patient states that this will be more than he would like to pay.  Suggested he apply for LIS first, then if he gets denied we will then apply to BMSPAF.  Will mail LIS info to him at his request.  Patient verbalized understanding.

## 2022-12-14 NOTE — Telephone Encounter (Signed)
Patient's sister is calling back to discuss this medication in regards to co-payment. She states that patient will not qualify for patient assistance. She would like to know if there is a generic version of this medication that would be available.

## 2022-12-14 NOTE — Telephone Encounter (Signed)
Sister called stating that she looked up the requirements for patient assistance requirements and the patient makes above the limit for income. Wants to know if there is something different that is more affordable but if not, patient will pay the $47 for medication. States that she would like to speak to [facility] about this since this is who she has been speaking with. Made sister aware that Edd Fabian is out of the office until Monday and that I will make sure she gets the message.

## 2022-12-17 NOTE — Telephone Encounter (Signed)
Discussed with sister Judson Roch) and patient - that $47.00 if great compared to what some others have to pay.  The only alternative would be Warfarin (Coumadin) but that comes with having to come into to office for finger pricks for dosing periodically.  Patient states that he will just keep paying the $47.00 for now.  And will let us know if he changes his mind in the future.

## 2023-01-11 ENCOUNTER — Other Ambulatory Visit: Payer: Self-pay | Admitting: Cardiology

## 2023-01-29 DIAGNOSIS — M545 Low back pain, unspecified: Secondary | ICD-10-CM | POA: Diagnosis not present

## 2023-01-29 DIAGNOSIS — M47812 Spondylosis without myelopathy or radiculopathy, cervical region: Secondary | ICD-10-CM | POA: Diagnosis not present

## 2023-01-29 DIAGNOSIS — M542 Cervicalgia: Secondary | ICD-10-CM | POA: Diagnosis not present

## 2023-01-29 DIAGNOSIS — M47816 Spondylosis without myelopathy or radiculopathy, lumbar region: Secondary | ICD-10-CM | POA: Diagnosis not present

## 2023-03-12 DIAGNOSIS — M542 Cervicalgia: Secondary | ICD-10-CM | POA: Diagnosis not present

## 2023-03-12 DIAGNOSIS — M47896 Other spondylosis, lumbar region: Secondary | ICD-10-CM | POA: Diagnosis not present

## 2023-03-27 DIAGNOSIS — M545 Low back pain, unspecified: Secondary | ICD-10-CM | POA: Diagnosis not present

## 2023-03-27 DIAGNOSIS — M542 Cervicalgia: Secondary | ICD-10-CM | POA: Diagnosis not present

## 2023-04-04 DIAGNOSIS — M5030 Other cervical disc degeneration, unspecified cervical region: Secondary | ICD-10-CM | POA: Diagnosis not present

## 2023-04-04 DIAGNOSIS — R269 Unspecified abnormalities of gait and mobility: Secondary | ICD-10-CM | POA: Diagnosis not present

## 2023-04-04 DIAGNOSIS — M47816 Spondylosis without myelopathy or radiculopathy, lumbar region: Secondary | ICD-10-CM | POA: Diagnosis not present

## 2023-04-04 DIAGNOSIS — D6869 Other thrombophilia: Secondary | ICD-10-CM | POA: Diagnosis not present

## 2023-04-09 ENCOUNTER — Ambulatory Visit: Payer: Medicare HMO | Attending: Cardiology | Admitting: Cardiology

## 2023-04-09 ENCOUNTER — Encounter: Payer: Self-pay | Admitting: Cardiology

## 2023-04-09 ENCOUNTER — Encounter (HOSPITAL_COMMUNITY): Payer: Self-pay

## 2023-04-09 ENCOUNTER — Other Ambulatory Visit: Payer: Self-pay

## 2023-04-09 ENCOUNTER — Emergency Department (HOSPITAL_COMMUNITY)
Admission: EM | Admit: 2023-04-09 | Discharge: 2023-04-09 | Disposition: A | Discharge status: Home / Self Care | Payer: Medicare HMO | Attending: Emergency Medicine | Admitting: Emergency Medicine

## 2023-04-09 VITALS — BP 72/50 | HR 100 | Ht 71.0 in | Wt 218.4 lb

## 2023-04-09 DIAGNOSIS — I2510 Atherosclerotic heart disease of native coronary artery without angina pectoris: Secondary | ICD-10-CM | POA: Diagnosis not present

## 2023-04-09 DIAGNOSIS — Z7984 Long term (current) use of oral hypoglycemic drugs: Secondary | ICD-10-CM | POA: Diagnosis not present

## 2023-04-09 DIAGNOSIS — R42 Dizziness and giddiness: Secondary | ICD-10-CM | POA: Diagnosis not present

## 2023-04-09 DIAGNOSIS — Z7901 Long term (current) use of anticoagulants: Secondary | ICD-10-CM | POA: Diagnosis not present

## 2023-04-09 DIAGNOSIS — N179 Acute kidney failure, unspecified: Secondary | ICD-10-CM | POA: Insufficient documentation

## 2023-04-09 DIAGNOSIS — I10 Essential (primary) hypertension: Secondary | ICD-10-CM | POA: Diagnosis not present

## 2023-04-09 DIAGNOSIS — Z743 Need for continuous supervision: Secondary | ICD-10-CM | POA: Diagnosis not present

## 2023-04-09 DIAGNOSIS — E860 Dehydration: Secondary | ICD-10-CM | POA: Diagnosis not present

## 2023-04-09 DIAGNOSIS — Z79899 Other long term (current) drug therapy: Secondary | ICD-10-CM | POA: Insufficient documentation

## 2023-04-09 DIAGNOSIS — I4891 Unspecified atrial fibrillation: Secondary | ICD-10-CM | POA: Diagnosis not present

## 2023-04-09 DIAGNOSIS — R531 Weakness: Secondary | ICD-10-CM | POA: Insufficient documentation

## 2023-04-09 DIAGNOSIS — E119 Type 2 diabetes mellitus without complications: Secondary | ICD-10-CM | POA: Insufficient documentation

## 2023-04-09 DIAGNOSIS — I499 Cardiac arrhythmia, unspecified: Secondary | ICD-10-CM | POA: Diagnosis not present

## 2023-04-09 LAB — CBC WITH DIFFERENTIAL/PLATELET
Abs Immature Granulocytes: 0.01 10*3/uL (ref 0.00–0.07)
Basophils Absolute: 0 10*3/uL (ref 0.0–0.1)
Basophils Relative: 0 %
Eosinophils Absolute: 0 10*3/uL (ref 0.0–0.5)
Eosinophils Relative: 0 %
HCT: 41.7 % (ref 39.0–52.0)
Hemoglobin: 13.3 g/dL (ref 13.0–17.0)
Immature Granulocytes: 0 %
Lymphocytes Relative: 16 %
Lymphs Abs: 1.4 10*3/uL (ref 0.7–4.0)
MCH: 28.1 pg (ref 26.0–34.0)
MCHC: 31.9 g/dL (ref 30.0–36.0)
MCV: 88 fL (ref 80.0–100.0)
Monocytes Absolute: 0.9 10*3/uL (ref 0.1–1.0)
Monocytes Relative: 11 %
Neutro Abs: 6.3 10*3/uL (ref 1.7–7.7)
Neutrophils Relative %: 73 %
Platelets: 150 10*3/uL (ref 150–400)
RBC: 4.74 MIL/uL (ref 4.22–5.81)
RDW: 16.3 % — ABNORMAL HIGH (ref 11.5–15.5)
WBC: 8.6 10*3/uL (ref 4.0–10.5)
nRBC: 0 % (ref 0.0–0.2)

## 2023-04-09 LAB — COMPREHENSIVE METABOLIC PANEL
ALT: 22 U/L (ref 0–44)
AST: 23 U/L (ref 15–41)
Albumin: 3.6 g/dL (ref 3.5–5.0)
Alkaline Phosphatase: 71 U/L (ref 38–126)
Anion gap: 9 (ref 5–15)
BUN: 38 mg/dL — ABNORMAL HIGH (ref 8–23)
CO2: 23 mmol/L (ref 22–32)
Calcium: 9.5 mg/dL (ref 8.9–10.3)
Chloride: 107 mmol/L (ref 98–111)
Creatinine, Ser: 1.49 mg/dL — ABNORMAL HIGH (ref 0.61–1.24)
GFR, Estimated: 50 mL/min — ABNORMAL LOW (ref >60)
Glucose, Bld: 100 mg/dL — ABNORMAL HIGH (ref 70–99)
Potassium: 4 mmol/L (ref 3.5–5.1)
Sodium: 139 mmol/L (ref 135–145)
Total Bilirubin: 1.2 mg/dL (ref 0.3–1.2)
Total Protein: 6.6 g/dL (ref 6.5–8.1)

## 2023-04-09 MED ORDER — SODIUM CHLORIDE 0.9 % IV BOLUS
1000.0000 mL | Freq: Once | INTRAVENOUS | Status: AC
  Administered 2023-04-09: 1000 mL via INTRAVENOUS

## 2023-04-09 NOTE — Discharge Instructions (Signed)
Thank you for coming to [facility] Emergency Department. You were seen for generalized weakness, low blood pressure. We did an exam, labs, and these showed a small acute kidney injury and likely dehydration. Your symptoms and blood pressure improved with IV fluids. Please stay well hydrated at home. Please follow up with your primary care provider within 1 week.   Do not hesitate to return to the ED or call 911 if you experience: -Worsening symptoms -Chest pain, shortness of breath -Lightheadedness, passing out -Fevers/chills -Anything else that concerns you

## 2023-04-09 NOTE — Patient Instructions (Signed)
Medication Instructions:   Continue all current medications.   Labwork:  none  Testing/Procedures:  none  Follow-Up:  Pending   Any Other Special Instructions Will Be Listed Below (If Applicable).  Going to [facility] ED for evaluation - hypotension & weakness   If you need a refill on your cardiac medications before your next appointment, please call your pharmacy.

## 2023-04-09 NOTE — ED Notes (Signed)
Pt ambulatory to bedside commode; pt was not able to pass stool so pt ambulated back to bed; pt comfortable in bed with call light within reach; pt requesting to speak to MD; RN notified

## 2023-04-09 NOTE — ED Notes (Signed)
Pt called back out needing assistance to the bedside stating he thinks he can use it now; assisted pt to bedside with call light within reach; will check back with pt in five min to see if pt is finished

## 2023-04-09 NOTE — ED Triage Notes (Signed)
Pt BIB [facility] from [facility] in [place] with reports of A-fib RVR and hypotension. Upon EMS arrival all patients vitals normal.  EMS vitals HR 92 BP 118/65

## 2023-04-09 NOTE — ED Provider Notes (Signed)
[place] EMERGENCY DEPARTMENT AT [facility] Provider Note   CSN: [id number] Arrival date & time: 04/09/23  1141     History {Add pertinent medical, surgical, social history, OB history to HPI:1} Chief Complaint  Patient presents with   Weakness    Stephen Graves is a 67 y.o. male.  Patient with a history of hypertension and diabetes and coronary artery disease.  He was at the cardiologist office and his blood pressure was noticed to be low.  He had some dizzy episodes.   Weakness      Home Medications Prior to Admission medications   Medication Sig Start Date End Date Taking? Authorizing Provider  apixaban (ELIQUIS) 5 MG TABS tablet Take 1 tablet (5 mg total) by mouth 2 (two) times daily. 12/03/22   Antoine Poche, MD  carvedilol (COREG) 3.125 MG tablet Take 1 tablet (3.125 mg total) by mouth 2 (two) times daily. Patient taking differently: Take 3.125 mg by mouth 2 (two) times daily with a meal. 10/31/21   Branch, Dorothe Pea, MD  Cholecalciferol (VITAMIN D3) 25 MCG (1000 UT) CAPS Take 1 capsule by mouth daily.    [provider]  citalopram (CELEXA) 40 MG tablet Take 0.5 tablets (20 mg total) by mouth daily. 05/31/21   Croitoru, Mihai, MD  fish oil-omega-3 fatty acids 1000 MG capsule Take 1 g by mouth daily.    [provider]  losartan (COZAAR) 50 MG tablet Take 0.5 tablets (25 mg total) by mouth in the morning and at bedtime. 07/10/21   Antoine Poche, MD  metFORMIN (GLUCOPHAGE-XR) 500 MG 24 hr tablet Take 1 tablet by mouth daily.    [provider]  nitroGLYCERIN (NITROSTAT) 0.4 MG SL tablet DISSOLVE ONE TABLET UNDER THE TONGUE EVERY 5 MINUTES AS NEEDED FOR CHEST PAIN.  DO NOT EXCEED A TOTAL OF 3 DOSES IN 15 MINUTES 05/31/21   Laverda Page B, NP  potassium chloride SA (K-DUR,KLOR-CON) 20 MEQ tablet Take 20 mEq by mouth daily.    [provider]  predniSONE (DELTASONE) 10 MG tablet Take 10 mg by mouth. 04/04/23    [provider]  rosuvastatin (CRESTOR) 20 MG tablet Take 1 tablet by mouth once daily 01/11/23   Antoine Poche, MD      Allergies    Lipitor [atorvastatin]    Review of Systems   Review of Systems  Neurological:  Positive for weakness.    Physical Exam Updated Vital Signs BP (!) 123/97   Pulse 71   Temp 97.6 F (36.4 C) (Oral)   Resp 16   Ht 5\' 11"  (1.803 m)   Wt 98.9 kg   SpO2 97%   BMI 30.40 kg/m  Physical Exam  ED Results / Procedures / Treatments   Labs (all labs ordered are listed, but only abnormal results are displayed) Labs Reviewed  CBC WITH DIFFERENTIAL/PLATELET - Abnormal; Notable for the following components:      Result Value   RDW 16.3 (*)    All other components within normal limits  COMPREHENSIVE METABOLIC PANEL - Abnormal; Notable for the following components:   Glucose, Bld 100 (*)    BUN 38 (*)    Creatinine, Ser 1.49 (*)    GFR, Estimated 50 (*)    All other components within normal limits    EKG None  Radiology No results found.  Procedures Procedures  {Document cardiac monitor, telemetry assessment procedure when appropriate:1}  Medications Ordered in ED Medications  sodium chloride  0.9 % bolus 1,000 mL (0 mLs Intravenous Stopped 04/09/23 1506)  sodium chloride 0.9 % bolus 1,000 mL (1,000 mLs Intravenous New Bag/Given 04/09/23 1556)    ED Course/ Medical Decision Making/ A&P   {   Click here for ABCD2, HEART and other calculatorsREFRESH Note before signing :1}                          Medical Decision Making Amount and/or Complexity of Data Reviewed Labs: ordered. ECG/medicine tests: ordered.   Patient with dehydration and has been orthostatics.  Chemistries and CBC unremarkable.  Patient is receiving a second liter of normal saline and will check orthostatics again  {Document critical care time when appropriate:1} {Document review of labs and clinical decision tools ie heart score, Chads2Vasc2 etc:1}   {Document your independent review of radiology images, and any outside records:1} {Document your discussion with family members, caretakers, and with consultants:1} {Document social determinants of health affecting pt's care:1} {Document your decision making why or why not admission, treatments were needed:1} Final Clinical Impression(s) / ED Diagnoses Final diagnoses:  None    Rx / DC Orders ED Discharge Orders     None

## 2023-04-09 NOTE — ED Provider Notes (Signed)
4:15 PM Assumed care of patient from off-going team. For more details, please see note from same day.  In brief, this is a 67 y.o. male w/ h/o CAD who had HoTN earlier. S/p 1L NS, orthostatics were positive. Had no CP/SOB, no ACS symtpoms.  Plan/Dispo at time of sign-out & ED Course since sign-out: 2nd L NS  BP 119/84   Pulse 89   Temp 97.6 F (36.4 C) (Oral)   Resp 18   Ht 5\' 11"  (1.803 m)   Wt 98.9 kg   SpO2 93%   BMI 30.40 kg/m    ED Course:   Clinical Course as of 04/09/23 1858  Tue Apr 09, 2023  1827 Orthostatics much improved from earlier after 2L IVF. Patient reevaluated. States he feels much improved and would like to go home. Vitally stable. Discussed with patient and his sister at bedside daily water intake and staying well-hydrated at home. Telemetry shows rate controlled Afib which patient has history of. He denies CP/SOB. Will DC w/ PCP f/u within 1-2 weeks. DC w/ discharge instructions/return precautions. All questions answered to patient's satisfaction.   [HN]    Clinical Course User Index [HN] Loetta Rough, MD    Impression: gen weakness, dehydration, AKI  ------------------------------- Vivi Barrack, MD Emergency Medicine  This note was created using dictation software, which may contain spelling or grammatical errors.   Loetta Rough, MD 04/09/23 [phone]

## 2023-04-09 NOTE — Progress Notes (Signed)
Clinical Summary Mr. Kirtz is a 67 y.o.male seen today for follow up of the following medical problems.    1.Hypotension - patient presents to clinic for routine visit - on initial bp check 72/50, on my recheck 85/55. He reports significant weakness this morning, is diaphoretic and clammy. Denies any vomiting or diarrhea. AC was out at his home but spent most of the day with his sister in the hot temps, denies any fevers or chills, no evidence of blood loss    Other medical issues not addressed this visit   1.CAD -prior DES to mid distal LAD in 06/2017, small septal Vanshika Jastrzebski jailed -05/2021 cath as reported below. DES to LAD . Presented with unstable angina.  05/2021 echo LVEF 60-65% - at 1 year interventional has recommended stopping ASA and continuing either low dose brillnita or plavix.  - he was on plavix monotherapy, at 11/2022 office visit new diagnosis of afib and started on eliquis, plavix was stopped.         2. HTN - medical therapy has been affected by chronic orthostatic symptoms.          3. Hyperlipidemia - he is on crestor.   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    5. Afib - new diagnosis made during 12/03/22 appt - denies any palpitations.  Past Medical History:  Diagnosis Date   Anxiety    Coronary artery disease involving native coronary 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 (HCC) 07/18/2017   Cardiac cath 1020 1418: Mid LAD to Dist LAD lesion, 99 %stenosed. - PCI with resolute DES (2.5 X 26 mm Resolute Onyx stent - 2.75 mm);ostial LAD 30%. Ostial ramus 40%. Proximal to mid LAD 50%. Normal LV function and normal LVEDP.    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 60 tablet 0   carvedilol (COREG) 3.125 MG tablet Take 1 tablet (3.125 mg total) by mouth 2 (two) times daily. (Patient taking differently: Take 3.125 mg by mouth 2 (two) times daily with a meal.)     Cholecalciferol (VITAMIN D3) 25 MCG (1000 UT) CAPS Take 1 capsule by mouth daily.     citalopram (CELEXA) 40 MG tablet Take 0.5 tablets (20 mg total) by mouth daily. 30 tablet 11   fish oil-omega-3 fatty acids 1000 MG capsule Take 1 g by mouth daily.     losartan (COZAAR) 50 MG tablet Take 0.5 tablets (25 mg total) by mouth in the morning and at bedtime. 90 tablet 3   metFORMIN (GLUCOPHAGE-XR) 500 MG 24 hr tablet Take 1 tablet by mouth daily.     nitroGLYCERIN (NITROSTAT) 0.4 MG SL tablet DISSOLVE ONE TABLET UNDER THE TONGUE EVERY 5 MINUTES AS NEEDED FOR CHEST PAIN.  DO NOT EXCEED A TOTAL OF 3 DOSES IN 15 MINUTES 25 tablet 2   potassium chloride SA (K-DUR,KLOR-CON) 20 MEQ tablet Take 20 mEq by mouth daily.     rosuvastatin (CRESTOR) 20  MG tablet Take 1 tablet by mouth once daily 90 tablet 2   No current facility-administered medications for this visit.     Past Surgical History:  Procedure Laterality Date   CORONARY STENT INTERVENTION N/A 07/17/2017   Procedure: CORONARY STENT INTERVENTION;  Surgeon: Iran Ouch, MD;  Location: [facility] INVASIVE CV LAB;  Service: Cardiovascular;  Laterality: N/A;   CORONARY STENT INTERVENTION N/A 05/30/2021   Procedure: CORONARY STENT INTERVENTION;  Surgeon: Marykay Lex, MD;  Location: [facility] INVASIVE CV LAB;  Service: Cardiovascular;  Laterality: N/A;   FOOT SURGERY Left    KNEE ARTHROPLASTY Left 07/30/2018   Procedure: LEFT TOTAL KNEE ARTHROPLASTY WITH COMPUTER NAVIGATION;  Surgeon: Samson Frederic, MD;  Location: [facility] ORS;  Service: Orthopedics;  Laterality: Left;  Needs RNFA   KNEE ARTHROPLASTY  Right 11/02/2020   Procedure: COMPUTER ASSISTED TOTAL KNEE ARTHROPLASTY;  Surgeon: Samson Frederic, MD;  Location: [facility] ORS;  Service: Orthopedics;  Laterality: Right;    LEFT HEART CATH AND CORONARY ANGIOGRAPHY N/A 07/17/2017   Procedure: LEFT HEART CATH AND CORONARY ANGIOGRAPHY;  Surgeon: Iran Ouch, MD;  Location: [facility] INVASIVE CV LAB;  Service: Cardiovascular;  Laterality: N/A;   LEFT HEART CATH AND CORONARY ANGIOGRAPHY N/A 05/30/2021   Procedure: LEFT HEART CATH AND CORONARY ANGIOGRAPHY;  Surgeon: Marykay Lex, MD;  Location: [facility] INVASIVE CV LAB;  Service: Cardiovascular;  Laterality: N/A;   left wrist surgery     SKIN GRAFT  1998   TONSILLECTOMY       Allergies  Allergen Reactions   Lipitor [Atorvastatin]     Pain in neck and legs after taking medication      Family History  Problem Relation Age of Onset   Stroke Father 22   Heart attack Father 47   CAD Mother    Prostate cancer Brother    Thyroid nodules Brother      Social History Mr. Hanrahan reports that he quit smoking about 34 years ago. His smoking use included cigarettes. He has never used smokeless tobacco. Mr. Eatherly reports no history of alcohol use.   Review of Systems CONSTITUTIONAL: No weight loss, fever, chills, weakness or fatigue.  HEENT: Eyes: No visual loss, blurred vision, double vision or yellow sclerae.No hearing loss, sneezing, congestion, runny nose or sore throat.  SKIN: No rash or itching.  CARDIOVASCULAR: per hpi RESPIRATORY: No shortness of breath, cough or sputum.  GASTROINTESTINAL: No anorexia, nausea, vomiting or diarrhea. No abdominal pain or blood.  GENITOURINARY: No burning on urination, no polyuria NEUROLOGICAL: No headache, dizziness, syncope, paralysis, ataxia, numbness or tingling in the extremities. No change in bowel or bladder control.  MUSCULOSKELETAL: No muscle, back pain, joint pain or stiffness.  LYMPHATICS: No enlarged nodes. No history of splenectomy.   PSYCHIATRIC: No history of depression or anxiety.  ENDOCRINOLOGIC: No reports of sweating, cold or heat intolerance. No polyuria or polydipsia.  [name]   Physical Examination Today's Vitals   04/09/23 1029 04/09/23 1047  BP: (!) 68/46 (!) 72/50  Pulse: 100   SpO2: 98%   Weight: 218 lb 6.4 oz (99.1 kg) 218 lb 6.4 oz (99.1 kg)  Height: 5\' 11"  (1.803 m)    Body mass index is 30.46 kg/m.  Gen: resting comfortably, no acute distress HEENT: no scleral icterus, pupils equal round and reactive, no palptable cervical adenopathy,  CV: irreg, no m/rg, no jvd Resp: Clear to auscultation bilaterally GI: abdomen is soft, non-tender, non-distended, normal bowel sounds, no hepatosplenomegaly MSK: extremities are warm,  no edema.  Skin: warm, no rash Neuro:  no focal deficits Psych: appropriate affect   Diagnostic Studies  Echocardiogram: 06/2017 Study Conclusions   - Left ventricle: The cavity size was normal. Wall thickness was    increased in a pattern of mild LVH. Systolic function was normal.    The estimated ejection fraction was in the range of 60% to 65%.    Wall motion was normal; there were no regional wall motion    abnormalities. Diastolic dysfunction, grade indeterminate.  - Aortic valve: Moderately calcified annulus. Trileaflet.    Cardiac Catheterization: 06/2017 The left ventricular systolic function is normal. LV end diastolic pressure is normal. The left ventricular ejection fraction is 55-65% by visual estimate. Ost LAD lesion, 30 %stenosed. Ost Ramus to Ramus lesion, 40 %stenosed. Prox LAD to Mid LAD lesion, 50 %stenosed. A drug eluting stent was successfully placed. Mid LAD to Dist LAD lesion, 99 %stenosed. Post intervention, there is a 0% residual stenosis.   1.  Severe one-vessel coronary artery disease with 99% stenosis in the mid to distal LAD. 2.  Normal LV systolic function and normal left ventricular end-diastolic pressure. 3.  Successful angioplasty and  drug-eluting stent placement to the mid/distal LAD.  A small septal Briannah Lona was jailed by the stent with sluggish flow.  This resulted in chest pain and was treated with nitroglycerin drip.   Post cath Recommendations: Dual antiplatelet therapy for at least one year.  Aggressive treatment of risk factors.  Continue nitroglycerin drip as needed for chest pain.   08/2022 echo 1. Left ventricular ejection fraction, by estimation, is 60 to 65%. The  left ventricle has normal function. The left ventricle has no regional  wall motion abnormalities. There is severe left ventricular hypertrophy.  Left ventricular diastolic parameters   are indeterminate.   2. Right ventricular systolic function is normal. The right ventricular  size is normal. Tricuspid regurgitation signal is inadequate for assessing  PA pressure.   3. A small pericardial effusion is present. The pericardial effusion is  circumferential.   4. The mitral valve is normal in structure. No evidence of mitral valve  regurgitation. No evidence of mitral stenosis.   5. The aortic valve is tricuspid. Aortic valve regurgitation is not  visualized. No aortic stenosis is present.   6. Aortic dilatation noted. There is mild dilatation of the aortic root,  measuring 40 mm. There is mild dilatation of the ascending aorta,  measuring 41 mm.    Assessment and Plan  1.Hypotension - 70s/50s initially on my manual rehcheck 80s/50s. Generalized weakness, diaphoretic.  - home AC was out yesterday but spent most of the day at his sisters. Reports adequate oral hydration, no history of acute blood loss, no fevers or chills, no significant chest pains.  - unclear etiology, we will have EMS transport him to [facility] ER for evaluation - EKG in clinic afib rates 110s.      Antoine Poche, M.D.

## 2023-04-09 NOTE — ED Notes (Signed)
Pt finished using bedside commode; assisted pt back to bed; pt comfortable with call light within reach

## 2023-04-11 DIAGNOSIS — E1159 Type 2 diabetes mellitus with other circulatory complications: Secondary | ICD-10-CM | POA: Diagnosis not present

## 2023-04-11 DIAGNOSIS — I25118 Atherosclerotic heart disease of native coronary artery with other forms of angina pectoris: Secondary | ICD-10-CM | POA: Diagnosis not present

## 2023-04-11 DIAGNOSIS — E119 Type 2 diabetes mellitus without complications: Secondary | ICD-10-CM | POA: Diagnosis not present

## 2023-04-11 DIAGNOSIS — E6609 Other obesity due to excess calories: Secondary | ICD-10-CM | POA: Diagnosis not present

## 2023-04-11 DIAGNOSIS — E785 Hyperlipidemia, unspecified: Secondary | ICD-10-CM | POA: Diagnosis not present

## 2023-04-11 DIAGNOSIS — Z6829 Body mass index (BMI) 29.0-29.9, adult: Secondary | ICD-10-CM | POA: Diagnosis not present

## 2023-04-11 DIAGNOSIS — F419 Anxiety disorder, unspecified: Secondary | ICD-10-CM | POA: Diagnosis not present

## 2023-04-16 DIAGNOSIS — M47816 Spondylosis without myelopathy or radiculopathy, lumbar region: Secondary | ICD-10-CM | POA: Diagnosis not present

## 2023-04-29 ENCOUNTER — Other Ambulatory Visit: Payer: Self-pay

## 2023-04-29 ENCOUNTER — Encounter (HOSPITAL_COMMUNITY): Payer: Self-pay

## 2023-04-29 ENCOUNTER — Emergency Department (HOSPITAL_COMMUNITY)
Admission: EM | Admit: 2023-04-29 | Discharge: 2023-04-29 | Disposition: A | Discharge status: Home / Self Care | Payer: Medicare HMO | Attending: Emergency Medicine | Admitting: Emergency Medicine

## 2023-04-29 DIAGNOSIS — R7989 Other specified abnormal findings of blood chemistry: Secondary | ICD-10-CM | POA: Diagnosis not present

## 2023-04-29 DIAGNOSIS — R55 Syncope and collapse: Secondary | ICD-10-CM | POA: Insufficient documentation

## 2023-04-29 DIAGNOSIS — I10 Essential (primary) hypertension: Secondary | ICD-10-CM | POA: Diagnosis not present

## 2023-04-29 DIAGNOSIS — I2510 Atherosclerotic heart disease of native coronary artery without angina pectoris: Secondary | ICD-10-CM | POA: Insufficient documentation

## 2023-04-29 DIAGNOSIS — Z955 Presence of coronary angioplasty implant and graft: Secondary | ICD-10-CM | POA: Diagnosis not present

## 2023-04-29 DIAGNOSIS — I4891 Unspecified atrial fibrillation: Secondary | ICD-10-CM | POA: Diagnosis not present

## 2023-04-29 DIAGNOSIS — Z7901 Long term (current) use of anticoagulants: Secondary | ICD-10-CM | POA: Diagnosis not present

## 2023-04-29 LAB — BASIC METABOLIC PANEL
Anion gap: 10 (ref 5–15)
BUN: 28 mg/dL — ABNORMAL HIGH (ref 8–23)
CO2: 20 mmol/L — ABNORMAL LOW (ref 22–32)
Calcium: 8.8 mg/dL — ABNORMAL LOW (ref 8.9–10.3)
Chloride: 104 mmol/L (ref 98–111)
Creatinine, Ser: 1.65 mg/dL — ABNORMAL HIGH (ref 0.61–1.24)
GFR, Estimated: 45 mL/min — ABNORMAL LOW (ref >60)
Glucose, Bld: 161 mg/dL — ABNORMAL HIGH (ref 70–99)
Potassium: 3.8 mmol/L (ref 3.5–5.1)
Sodium: 134 mmol/L — ABNORMAL LOW (ref 135–145)

## 2023-04-29 LAB — TROPONIN I (HIGH SENSITIVITY)
Troponin I (High Sensitivity): 9 ng/L (ref <18)
Troponin I (High Sensitivity): 7 ng/L (ref <18)

## 2023-04-29 LAB — URINALYSIS, MICROSCOPIC (REFLEX)

## 2023-04-29 LAB — CBC
HCT: 42.7 % (ref 39.0–52.0)
Hemoglobin: 13.6 g/dL (ref 13.0–17.0)
MCH: 28.2 pg (ref 26.0–34.0)
MCHC: 31.9 g/dL (ref 30.0–36.0)
MCV: 88.6 fL (ref 80.0–100.0)
Platelets: 160 10*3/uL (ref 150–400)
RBC: 4.82 MIL/uL (ref 4.22–5.81)
RDW: 17.3 % — ABNORMAL HIGH (ref 11.5–15.5)
WBC: 5.2 10*3/uL (ref 4.0–10.5)
nRBC: 0 % (ref 0.0–0.2)

## 2023-04-29 LAB — URINALYSIS, ROUTINE W REFLEX MICROSCOPIC
Glucose, UA: NEGATIVE mg/dL
Hgb urine dipstick: NEGATIVE
Ketones, ur: NEGATIVE mg/dL
Leukocytes,Ua: NEGATIVE
Nitrite: NEGATIVE
Protein, ur: 30 mg/dL — AB
Specific Gravity, Urine: 1.015 (ref 1.005–1.030)
pH: 7 (ref 5.0–8.0)

## 2023-04-29 LAB — BRAIN NATRIURETIC PEPTIDE: B Natriuretic Peptide: 46 pg/mL (ref 0.0–100.0)

## 2023-04-29 LAB — CBG MONITORING, ED: Glucose-Capillary: 160 mg/dL — ABNORMAL HIGH (ref 70–99)

## 2023-04-29 LAB — MAGNESIUM: Magnesium: 2.1 mg/dL (ref 1.7–2.4)

## 2023-04-29 MED ORDER — LACTATED RINGERS BOLUS PEDS
1000.0000 mL | Freq: Once | INTRAVENOUS | Status: AC
  Administered 2023-04-29: 1000 mL via INTRAVENOUS

## 2023-04-29 NOTE — Discharge Instructions (Signed)
You were seen for passing out in the emergency department.   At home, please stay well hydrated and STOP taking your Losartan.    Check your MyChart online for the results of any tests that had not resulted by the time you left the emergency department.   Follow-up with your primary doctor in 2-3 days regarding your visit.  Cardiology will reach out to you about an appointment.  Return immediately to the emergency department if you experience any of the following: Chest pain, shortness of breath, fainting, or any other concerning symptoms.    Thank you for visiting our Emergency Department. It was a pleasure taking care of you today.

## 2023-04-29 NOTE — ED Triage Notes (Signed)
Pt brought in by sister stating pt was out running errands with her this morning and became very weak. After assisting pt back into the car sister states pt became unresponsive "like a seizure" and once she was able to get him to come to pt did not recall what had happened. (Sister states maybe lasting 10-15 seconds.) Pt seen over 2 weeks ago for hypotension.

## 2023-04-29 NOTE — ED Provider Notes (Signed)
[place] EMERGENCY DEPARTMENT AT [facility] Provider Note   CSN: [id number] Arrival date & time: 04/29/23  1134     History {Add pertinent medical, surgical, social history, OB history to HPI:1} Chief Complaint  Patient presents with   Loss of Consciousness    Stephen Graves is a 67 y.o. male.  67 year old male with a history of orthostatic hypotension, hypertension, CAD status post PCI, and atrial fibrillation on Eliquis who presents emergency department with a syncopal event.  Patient was returning from running an errand and was in the car with his wife when he started feeling very dizzy and his wife reports that he slumped over and had a brief period of shaking that lasted several seconds and then was back to his normal self and under a minute.  No bowel or bladder incontinence.  No history of seizures.  Had a similar episode where he was found to be hypotensive by his cardiologist Dr. Wyline Mood and came into the emergency department for fluids and had been doing well since then.  Denies any recent illnesses to me.  No bleeding.  No chest pain or shortness of breath.       Home Medications Prior to Admission medications   Medication Sig Start Date End Date Taking? Authorizing Provider  apixaban (ELIQUIS) 5 MG TABS tablet Take 1 tablet (5 mg total) by mouth 2 (two) times daily. 12/03/22   Antoine Poche, MD  carvedilol (COREG) 3.125 MG tablet Take 1 tablet (3.125 mg total) by mouth 2 (two) times daily. Patient taking differently: Take 3.125 mg by mouth 2 (two) times daily with a meal. 10/31/21   Branch, Dorothe Pea, MD  Cholecalciferol (VITAMIN D3) 25 MCG (1000 UT) CAPS Take 1 capsule by mouth daily.    [provider]  citalopram (CELEXA) 40 MG tablet Take 0.5 tablets (20 mg total) by mouth daily. 05/31/21   Croitoru, Mihai, MD  fish oil-omega-3 fatty acids 1000 MG capsule Take 1 g by mouth daily.    [provider]  losartan (COZAAR) 50 MG tablet Take  0.5 tablets (25 mg total) by mouth in the morning and at bedtime. 07/10/21   Antoine Poche, MD  metFORMIN (GLUCOPHAGE-XR) 500 MG 24 hr tablet Take 1 tablet by mouth daily.    [provider]  nitroGLYCERIN (NITROSTAT) 0.4 MG SL tablet DISSOLVE ONE TABLET UNDER THE TONGUE EVERY 5 MINUTES AS NEEDED FOR CHEST PAIN.  DO NOT EXCEED A TOTAL OF 3 DOSES IN 15 MINUTES 05/31/21   Laverda Page B, NP  potassium chloride SA (K-DUR,KLOR-CON) 20 MEQ tablet Take 20 mEq by mouth daily.    [provider]  predniSONE (DELTASONE) 10 MG tablet Take 10 mg by mouth. 04/04/23   [provider]  rosuvastatin (CRESTOR) 20 MG tablet Take 1 tablet by mouth once daily 01/11/23   Antoine Poche, MD      Allergies    Lipitor [atorvastatin]    Review of Systems   Review of Systems  Physical Exam Updated Vital Signs Ht 5\' 11"  (1.803 m)   Wt 98.9 kg   BMI 30.40 kg/m  Physical Exam Vitals and nursing note reviewed.  Constitutional:      General: He is not in acute distress.    Appearance: He is well-developed.  HENT:     Head: Normocephalic and atraumatic.     Right Ear: External ear normal.     Left Ear: External ear normal.     Nose:  Nose normal.  Eyes:     Extraocular Movements: Extraocular movements intact.     Conjunctiva/sclera: Conjunctivae normal.     Pupils: Pupils are equal, round, and reactive to light.  Cardiovascular:     Rate and Rhythm: Normal rate. Rhythm irregular.     Heart sounds: Normal heart sounds.  Pulmonary:     Effort: Pulmonary effort is normal. No respiratory distress.     Breath sounds: Normal breath sounds.  Abdominal:     General: There is no distension.     Palpations: Abdomen is soft. There is no mass.     Tenderness: There is no abdominal tenderness. There is no guarding.  Musculoskeletal:     Cervical back: Normal range of motion and neck supple.     Right lower leg: No edema.     Left lower leg: No edema.  Skin:    General: Skin is  warm and dry.  Neurological:     Mental Status: He is alert. Mental status is at baseline.  Psychiatric:        Mood and Affect: Mood normal.        Behavior: Behavior normal.     ED Results / Procedures / Treatments   Labs (all labs ordered are listed, but only abnormal results are displayed) Labs Reviewed  CBG MONITORING, ED - Abnormal; Notable for the following components:      Result Value   Glucose-Capillary 160 (*)    All other components within normal limits  BASIC METABOLIC PANEL  CBC  URINALYSIS, ROUTINE W REFLEX MICROSCOPIC  MAGNESIUM  BRAIN NATRIURETIC PEPTIDE  TROPONIN I (HIGH SENSITIVITY)    EKG EKG Interpretation Date/Time:  Monday April 29 2023 11:49:41 EDT Ventricular Rate:  111 PR Interval:    QRS Duration:  94 QT Interval:  330 QTC Calculation: 449 R Axis:   -14  Text Interpretation: Atrial fibrillation Paired ventricular premature complexes Abnormal R-wave progression, late transition Nonspecific repol abnormality, lateral leads Confirmed by Vonita Moss [phone]) on 04/29/2023 12:09:33 PM  Radiology No results found.  Procedures Procedures  {Document cardiac monitor, telemetry assessment procedure when appropriate:1}  Medications Ordered in ED Medications  lactated ringers bolus PEDS (1,000 mLs Intravenous Bolus 04/29/23 1204)    ED Course/ Medical Decision Making/ A&P   {   Click here for ABCD2, HEART and other calculatorsREFRESH Note before signing :1}                              Medical Decision Making Amount and/or Complexity of Data Reviewed Labs: ordered.   ***  {Document critical care time when appropriate:1} {Document review of labs and clinical decision tools ie heart score, Chads2Vasc2 etc:1}  {Document your independent review of radiology images, and any outside records:1} {Document your discussion with family members, caretakers, and with consultants:1} {Document social determinants of health affecting pt's  care:1} {Document your decision making why or why not admission, treatments were needed:1} Final Clinical Impression(s) / ED Diagnoses Final diagnoses:  None    Rx / DC Orders ED Discharge Orders     None

## 2023-05-01 ENCOUNTER — Telehealth: Payer: Self-pay | Admitting: Cardiology

## 2023-05-01 NOTE — Telephone Encounter (Signed)
Pt c/o medication issue:  1. Name of Medication:   losartan-hydrochlorothiazide (HYZAAR) 100-25 MG tablet   2. How are you currently taking this medication (dosage and times per day)?   3. Are you having a reaction (difficulty breathing--STAT)?   4. What is your medication issue?    Sister stated she is following up on patient stopping his BP medication.

## 2023-05-01 NOTE — Telephone Encounter (Signed)
Spoke with sister Stephen Graves) is inquiring whether to definitely stop the Losartan-hydrochlorothiazide. She took patient to ED on 08/05 and per discharge instructions he wasn't to take it to to his BP dropping when he is up and down. The last dose that was taken was on Monday morning before he went to the ED.He is scheduled with Lanora Manis on 08/16/. Please advise

## 2023-05-03 NOTE — Telephone Encounter (Signed)
Spoke to [place], pt's sister, notified pt's sister that pt should stay off medication until pt is seen by E. Philis Nettle, NP on 8/16. Sister had no further questions or concerns at this time.

## 2023-05-03 NOTE — Telephone Encounter (Signed)
I would stay off the medication until follow up with Lanora Manis and can reassess then  Dominga Ferry MD

## 2023-05-06 ENCOUNTER — Telehealth: Payer: Self-pay

## 2023-05-06 NOTE — Telephone Encounter (Signed)
Transition Care Management Follow-up Telephone Call Date of discharge and from where: 04/29/2023 [facility] How have you been since you were released from the hospital? Patient declined to participate.   [facility]  [facility] Population Health Community Resource Care Guide   ??[email or web address]  ?? [phone]   Website: [email or web address]  [email or web address]

## 2023-05-10 ENCOUNTER — Ambulatory Visit: Payer: Medicare HMO | Attending: Nurse Practitioner | Admitting: Nurse Practitioner

## 2023-05-10 ENCOUNTER — Encounter: Payer: Self-pay | Admitting: Nurse Practitioner

## 2023-05-10 VITALS — BP 160/120 | HR 80 | Ht 71.0 in | Wt 223.0 lb

## 2023-05-10 DIAGNOSIS — E785 Hyperlipidemia, unspecified: Secondary | ICD-10-CM | POA: Diagnosis not present

## 2023-05-10 DIAGNOSIS — I77819 Aortic ectasia, unspecified site: Secondary | ICD-10-CM

## 2023-05-10 DIAGNOSIS — Z79899 Other long term (current) drug therapy: Secondary | ICD-10-CM | POA: Diagnosis not present

## 2023-05-10 DIAGNOSIS — N189 Chronic kidney disease, unspecified: Secondary | ICD-10-CM | POA: Diagnosis not present

## 2023-05-10 DIAGNOSIS — R42 Dizziness and giddiness: Secondary | ICD-10-CM

## 2023-05-10 DIAGNOSIS — N1831 Chronic kidney disease, stage 3a: Secondary | ICD-10-CM

## 2023-05-10 DIAGNOSIS — I10 Essential (primary) hypertension: Secondary | ICD-10-CM

## 2023-05-10 DIAGNOSIS — I2510 Atherosclerotic heart disease of native coronary artery without angina pectoris: Secondary | ICD-10-CM | POA: Diagnosis not present

## 2023-05-10 DIAGNOSIS — I4891 Unspecified atrial fibrillation: Secondary | ICD-10-CM

## 2023-05-10 MED ORDER — CARVEDILOL 6.25 MG PO TABS: 6.2500 mg | ORAL_TABLET | Freq: Two times a day (BID) | ORAL | 6 refills | Status: DC

## 2023-05-10 NOTE — Patient Instructions (Signed)
Medication Instructions:   Increase Coreg to 6.25mg  twice a day   Continue to hold the Losartan for now Continue all other medications.     Labwork:  BMET - order given  PLEASE DO TODAY Office will contact with results via phone, letter or mychart.     Testing/Procedures:  none  Follow-Up:  3-4 weeks    Any Other Special Instructions Will Be Listed Below (If Applicable).  [place]  BP log   If you need a refill on your cardiac medications before your next appointment, please call your pharmacy.

## 2023-05-10 NOTE — Progress Notes (Unsigned)
  Cardiology Office Note:  .   Date:  05/10/2023 ID:  Stephen Graves, DOB 11/27/1955, MRN [id number] PCP: Assunta Found, MD  [facility] Providers Cardiologist:  Stephen Rich, MD { Click to update primary MD,subspecialty MD or APP then REFRESH:1}   History of Present Illness: .   Stephen Graves is a 67 y.o. male with a PMH of CAD, hypotension/HTN, orthostatic dizziness, HLD, and A-fib, who presents today for ER follow-up.  Previous CV history includes prior DES to mLAD in 2018, small septal branch jailed. In 2022, underwent cardiac catheterization with full report noted below, received DES to LAD. Diagnosed with A-fib at March 2024 office visit, started on Eliquis, plavix was stopped.   Last seen by Dr. [name] on April 09, 2023. BP was low in office. Pt noted generalized weakness/was diaphoretic. EKG showed A-fib with HR 110's. [facility] for [facility]. Presented back to ED on April 29, 2023 for syncopal event while in his car, very brief. Denied any hx of seizures. Workup was overall unremarkable. He received IV fluids and losartan was d/c from med list.   Today he presents for ED follow-up. He states...   ROS: ***  Studies Reviewed: .        *** Risk Assessment/Calculations:   {Does this patient have ATRIAL FIBRILLATION?:[phone]} No BP recorded.  {Refresh Note OR Click here to enter BP  :1}***       Physical Exam:   VS:  There were no vitals taken for this visit.   Wt Readings from Last 3 Encounters:  04/29/23 217 lb 15.9 oz (98.9 kg)  04/09/23 218 lb (98.9 kg)  04/09/23 218 lb 6.4 oz (99.1 kg)    GEN: Well nourished, well developed in no acute distress NECK: No JVD; No carotid bruits CARDIAC: ***RRR, no murmurs, rubs, gallops RESPIRATORY:  Clear to auscultation without rales, wheezing or rhonchi  ABDOMEN: Soft, non-tender, non-distended EXTREMITIES:  No edema; No deformity   ASSESSMENT AND PLAN: .   ***    {Are you ordering a CV  Procedure (e.g. stress test, cath, DCCV, TEE, etc)?   Press F2        :[id number]}  Dispo: ***  Signed, Stephen Dory, NP

## 2023-05-11 LAB — BASIC METABOLIC PANEL
BUN/Creatinine Ratio: 13 (ref 10–24)
BUN: 16 mg/dL (ref 8–27)
CO2: 22 mmol/L (ref 20–29)
Calcium: 9 mg/dL (ref 8.6–10.2)
Chloride: 106 mmol/L (ref 96–106)
Creatinine, Ser: 1.27 mg/dL (ref 0.76–1.27)
Glucose: 108 mg/dL — ABNORMAL HIGH (ref 70–99)
Potassium: 4.7 mmol/L (ref 3.5–5.2)
Sodium: 141 mmol/L (ref 134–144)
eGFR: 61 mL/min/{1.73_m2} (ref >59)

## 2023-05-12 ENCOUNTER — Encounter: Payer: Self-pay | Admitting: Nurse Practitioner

## 2023-05-13 ENCOUNTER — Telehealth: Payer: Self-pay | Admitting: Cardiology

## 2023-05-13 ENCOUNTER — Other Ambulatory Visit: Payer: Self-pay

## 2023-05-13 ENCOUNTER — Telehealth: Payer: Self-pay | Admitting: Nurse Practitioner

## 2023-05-13 DIAGNOSIS — Z79899 Other long term (current) drug therapy: Secondary | ICD-10-CM

## 2023-05-13 MED ORDER — LOSARTAN POTASSIUM 25 MG PO TABS: 12.5000 mg | ORAL_TABLET | Freq: Every day | ORAL | 1 refills | Status: DC

## 2023-05-13 NOTE — Telephone Encounter (Signed)
Wanting to update the medication that was told to Stephen Graves he was taking at his appt this past Friday 8/16.  Please call Sarah [phone]

## 2023-05-13 NOTE — Telephone Encounter (Signed)
Called patient-Spoke with his sister Sara(DPR) advise of Lab results and to restart Losartan. Will check BMET in a week. Orders mailed and verbalized understanding.

## 2023-05-13 NOTE — Telephone Encounter (Signed)
Patient's sister is calling in about his labs results. Please advise

## 2023-05-13 NOTE — Telephone Encounter (Signed)
Spoke with daughter regarding medications: Explained that Lanora Manis has prescribed Losartan 12.5 mg daily. Can disregard the medication of the Losartan hydrochlorothiazide  that he was on. Daughter verbalized understanding.

## 2023-05-14 DIAGNOSIS — M47896 Other spondylosis, lumbar region: Secondary | ICD-10-CM | POA: Diagnosis not present

## 2023-05-16 ENCOUNTER — Telehealth: Payer: Self-pay | Admitting: Cardiology

## 2023-05-16 NOTE — Telephone Encounter (Signed)
Patient notified and verbalized understanding. 

## 2023-05-16 NOTE — Telephone Encounter (Signed)
Pt sister called back to inform pt has an appt with his PCP tomorrow

## 2023-05-16 NOTE — Telephone Encounter (Signed)
A little confusing on the losartan, from what I gather he just restarted the 12.5mg  daily. If so would give that some time to take effect particularly with prior orhostatic issues in the past, update [place] on bp's early next week. His pcp may decide to adjust as well at there appt  Dominga Ferry MD

## 2023-05-16 NOTE — Telephone Encounter (Signed)
Pt's sister calling as requested to report pt's BP after starting medication Losartan 12.5 MG. Please advise  Tuesday  178/110 170/123 165/120 151/89 - Tuesday Night  152/111 - Last night

## 2023-05-17 DIAGNOSIS — E6609 Other obesity due to excess calories: Secondary | ICD-10-CM | POA: Diagnosis not present

## 2023-05-17 DIAGNOSIS — R55 Syncope and collapse: Secondary | ICD-10-CM | POA: Diagnosis not present

## 2023-05-17 DIAGNOSIS — I10 Essential (primary) hypertension: Secondary | ICD-10-CM | POA: Diagnosis not present

## 2023-05-17 DIAGNOSIS — Z6830 Body mass index (BMI) 30.0-30.9, adult: Secondary | ICD-10-CM | POA: Diagnosis not present

## 2023-05-17 DIAGNOSIS — I959 Hypotension, unspecified: Secondary | ICD-10-CM | POA: Diagnosis not present

## 2023-05-20 ENCOUNTER — Telehealth: Payer: Self-pay | Admitting: Cardiovascular Disease

## 2023-05-20 DIAGNOSIS — Z79899 Other long term (current) drug therapy: Secondary | ICD-10-CM | POA: Diagnosis not present

## 2023-05-20 NOTE — Telephone Encounter (Signed)
Patient all last week only took losartan 12.5 mg daily and his BP ran high  Wednesday:154/111   Thursday 157/142  2hr after med;142/93  Friday 150/78  After starting BID on Saturday BP 118/93  SUN:103/79    Family is concerned when changing it back it will get too high but they are worried if they continue on BID that it will remain too low

## 2023-05-20 NOTE — Telephone Encounter (Signed)
Pt c/o medication issue:  1. Name of Medication: Losartan  2. How are you currently taking this medication (dosage and times per day)? 12.5 mg  2 times a day on 05-17-23- started Saturday per Dr Renette Butters (primary doctor)Saturday 1  3. Are you having a reaction (difficulty breathing--STAT)?     His blood pressure reading after he started taking the new dose of Losartan   4. What is your medication issue? Saturday it was 118/93 Sunday 103/89  today at 12:15 104/72  12:38  IT WAS 107/77 1:15 95/62 1:20 in his other arm it was 82/60  1:25 PM  92/55 1:27 it was 75/53- she had him drink water and Gatorade-  1:50 it was 100/66  2:00 120/82 2:20 it was 100/59     She said the primary doctor was not upset about his  reading on Friday of  150/78

## 2023-05-20 NOTE — Telephone Encounter (Signed)
Looks like Dr.Branch patient- will route to [facility] triage. See previous messaging about blood pressure concerns. Thanks!

## 2023-05-20 NOTE — Telephone Encounter (Signed)
BP's too low, if taking losartan 12.5mg  bid then lower to just once a day and update [place] Thursday on bp's  J Lemoine Goyne MD

## 2023-05-21 LAB — BASIC METABOLIC PANEL
BUN/Creatinine Ratio: 13 (ref 10–24)
BUN: 18 mg/dL (ref 8–27)
CO2: 23 mmol/L (ref 20–29)
Calcium: 9.4 mg/dL (ref 8.6–10.2)
Chloride: 103 mmol/L (ref 96–106)
Creatinine, Ser: 1.38 mg/dL — ABNORMAL HIGH (ref 0.76–1.27)
Glucose: 104 mg/dL — ABNORMAL HIGH (ref 70–99)
Potassium: 4.6 mmol/L (ref 3.5–5.2)
Sodium: 140 mmol/L (ref 134–144)
eGFR: 55 mL/min/{1.73_m2} — ABNORMAL LOW (ref >59)

## 2023-05-22 NOTE — Telephone Encounter (Signed)
To clarify the low bp's reported in prior messages were while he was on losartan 12.5mg  bid. If so I would stick with just 12.5mg  once a day, I'd rather be run a little high 140s-150s then have the severely low bp's.  Dominga Ferry MD

## 2023-05-22 NOTE — Telephone Encounter (Signed)
Patient has agreed to take the medication once daily

## 2023-06-11 ENCOUNTER — Ambulatory Visit: Payer: Medicare HMO | Admitting: Nurse Practitioner

## 2023-06-17 ENCOUNTER — Encounter: Payer: Self-pay | Admitting: Nurse Practitioner

## 2023-06-17 ENCOUNTER — Ambulatory Visit: Payer: Medicare HMO | Attending: Nurse Practitioner | Admitting: Nurse Practitioner

## 2023-06-17 VITALS — BP 124/80 | HR 79 | Ht 71.0 in | Wt 223.2 lb

## 2023-06-17 DIAGNOSIS — E785 Hyperlipidemia, unspecified: Secondary | ICD-10-CM | POA: Diagnosis not present

## 2023-06-17 DIAGNOSIS — N1831 Chronic kidney disease, stage 3a: Secondary | ICD-10-CM | POA: Diagnosis not present

## 2023-06-17 DIAGNOSIS — I77819 Aortic ectasia, unspecified site: Secondary | ICD-10-CM | POA: Diagnosis not present

## 2023-06-17 DIAGNOSIS — I4891 Unspecified atrial fibrillation: Secondary | ICD-10-CM

## 2023-06-17 DIAGNOSIS — Z01810 Encounter for preprocedural cardiovascular examination: Secondary | ICD-10-CM | POA: Diagnosis not present

## 2023-06-17 DIAGNOSIS — Z79899 Other long term (current) drug therapy: Secondary | ICD-10-CM

## 2023-06-17 DIAGNOSIS — I2510 Atherosclerotic heart disease of native coronary artery without angina pectoris: Secondary | ICD-10-CM | POA: Diagnosis not present

## 2023-06-17 DIAGNOSIS — E875 Hyperkalemia: Secondary | ICD-10-CM

## 2023-06-17 DIAGNOSIS — I10 Essential (primary) hypertension: Secondary | ICD-10-CM

## 2023-06-17 MED ORDER — LOSARTAN POTASSIUM 25 MG PO TABS: 12.5000 mg | ORAL_TABLET | Freq: Every day | ORAL | 1 refills | Status: DC

## 2023-06-17 MED ORDER — CARVEDILOL 6.25 MG PO TABS: 6.2500 mg | ORAL_TABLET | Freq: Two times a day (BID) | ORAL | 1 refills | Status: DC

## 2023-06-17 MED ORDER — APIXABAN 5 MG PO TABS: 5.0000 mg | ORAL_TABLET | Freq: Two times a day (BID) | ORAL | 5 refills | Status: DC

## 2023-06-17 NOTE — Patient Instructions (Addendum)
Medication Instructions:  Your physician recommends that you continue on your current medications as directed. Please refer to the Current Medication list given to you today.  Labwork: BMET in 1-2 weeks   Testing/Procedures: None  Follow-Up: Your physician recommends that you schedule a follow-up appointment in: 6-8 weeks   Any Other Special Instructions Will Be Listed Below (If Applicable).  If you need a refill on your cardiac medications before your next appointment, please call your pharmacy.

## 2023-06-17 NOTE — Progress Notes (Unsigned)
Cardiology Office Note:  .   Date:  06/17/2023 ID:  Stephen Graves, DOB 06/12/1956, MRN [id number] PCP: Assunta Found, MD  [facility] Providers Cardiologist:  Dina Rich, MD    History of Present Illness: .   Stephen Graves is a 67 y.o. male with a PMH of CAD, hypotension/HTN, orthostatic dizziness, HLD, CKD, and A-fib, who presents today for ER follow-up.  Previous CV history includes prior DES to mLAD in 2018, small septal branch jailed. In 2022, underwent cardiac catheterization with full report noted below, received DES to LAD. Diagnosed with A-fib at March 2024 office visit, started on Eliquis, plavix was stopped.   Last seen by Dr. Dina Rich on April 09, 2023. BP was low in office. Pt noted generalized weakness/was diaphoretic. EKG showed A-fib with HR 110's. [facility] for [facility]. Presented back to ED on April 29, 2023 for syncopal event while in his car, very brief. Denied any hx of seizures. Workup was overall unremarkable. He received IV fluids and losartan was d/c from med list.   05/10/2023 - Seen for ED follow-up. Was overall doing well. Clarified that that he did not have a syncopal event prior to most recent ED visit, sounded near-syncopal. Denied any chest pain, shortness of breath, palpitations, syncope, presyncope, dizziness, orthopnea, PND, swelling or significant weight changes, acute bleeding, or claudication. Most recent SBP was in 130's. Denied any slurred speech, vision changes, or stroke-like symptoms. BP in office 160/120. Coreg increased to 6.25 mg BID. Losartan re-started at 12.5 mg daily.   06/17/2023 - Today he presents for follow-up with her sister. Provides BP log that shows SBP averaging 130's - 150's, some SBP averaging 70's, however states ("legs were crossed") next to these readings. BP today in office 124/80. Pt is doing well. Denies any chest pain, shortness of breath, palpitations, syncope, presyncope, dizziness,  orthopnea, PND, swelling or significant weight changes, acute bleeding, or claudication. Sister says going for radiofrequency ablation with Dr. Ethelene Hal Raechel [place]) of his lower back scheduled for this Friday.    Studies Reviewed: .    Echo 08/2022:  1. Left ventricular ejection fraction, by estimation, is 60 to 65%. The  left ventricle has normal function. The left ventricle has no regional  wall motion abnormalities. There is severe left ventricular hypertrophy. Left ventricular diastolic parameters are indeterminate.   2. Right ventricular systolic function is normal. The right ventricular  size is normal. Tricuspid regurgitation signal is inadequate for assessing PA pressure.   3. A small pericardial effusion is present. The pericardial effusion is  circumferential.   4. The mitral valve is normal in structure. No evidence of mitral valve regurgitation. No evidence of mitral stenosis.   5. The aortic valve is tricuspid. Aortic valve regurgitation is not  visualized. No aortic stenosis is present.   6. Aortic dilatation noted. There is mild dilatation of the aortic root,  measuring 40 mm. There is mild dilatation of the ascending aorta,  measuring 41 mm.  LHC 05/2021:   Ost LAD to Prox LAD lesion is 30% stenosed.   CULPRIT LESION SEGMENT: Mid LAD-1 lesion is 55% stenosed & Mid LAD-2 lesion is 100% stenosed.-Appears to be chronically occluded (with negative troponins at least greater than 1 week)   Mid LAD to Dist LAD stent is 100% stenosed. ->   Balloon angioplasty was performed using a 2.25 x 20 mm Sapphire balloon to restore TIMI-3 flow from the occlusion site to the previously placed stent.[name]  A drug-eluting stent was successfully placed covering the entire segment of 2 and overlapping the previously placed stent, using a SYNERGY XD 2.50X48.  Postdilated to 2.85 mm including the previously placed stent.   Post intervention, there is a 0% residual stenosis in the entire mid LAD lesions  extending into the previously placed mid-distal LAD stent.[name]   -----------------------------------------   Ramus-1 lesion is 60% stenosed.  Ramus-2 lesion is 60% stenosed.   RV Branch lesion is 75% stenosed.   ------------------------------------------.   LV end diastolic pressure is mildly elevated.   There is no aortic valve stenosis.   SUMMARY Left Dominant system with 3-vessel CAD: Proximal to mid LAD after D1 long 50% stenosis with 100% likely CTO after SP2 prior to previously placed distal LAD stent. Successful CTO PTCA/DES PCI of proximal to distal LAD from just distal to D1 overlapping previously placed stent restoring TIMI-3 flow (Synergy XD DES 2.5 mm x 48 mm postdilated to 2.85 mm)  Ostial RI 60% and proximal RI 60% 75% focal stenosis of small nondominant RCA Very large caliber, dominant LCx with diffuse 20 to 30% stenosis in the PDA LVEDP 18 mmHg.   RECOMMENDATIONS Wean off nitroglycerin infusion overnight, titrate antihypertensives while inpatient-anticipate discharge in the morning. CRH 1 consult DAPT uninterrupted x1 year and then lifelong Thienopyridine.  Risk Assessment/Calculations:    CHA2DS2-VASc Score = 3   This indicates a 3.2% annual risk of stroke. The patient's score is based upon: CHF History: 0 HTN History: 1 Diabetes History: 0 Stroke History: 0 Vascular Disease History: 1 Age Score: 1 Gender Score: 0  Physical Exam:   VS:  BP 124/80   Pulse 79   Ht 5\' 11"  (1.803 m)   Wt 223 lb 3.2 oz (101.2 kg)   SpO2 99%   BMI 31.13 kg/m    Wt Readings from Last 3 Encounters:  06/17/23 223 lb 3.2 oz (101.2 kg)  05/10/23 223 lb (101.2 kg)  04/29/23 217 lb 15.9 oz (98.9 kg)    GEN: Obese, 67 y.o. male in no acute distress NECK: No JVD; No carotid bruits CARDIAC: S1/S2, irregularly irregular, no murmurs, rubs, gallops RESPIRATORY:  Clear to auscultation without rales, wheezing or rhonchi  ABDOMEN: Soft, non-tender, non-distended EXTREMITIES:  No edema; No  deformity   ASSESSMENT AND PLAN: .    HTN BP at goal today. SBP goal < 140. Continue current medication regimen. Discussed to monitor BP at home at least 2 hours after medications and sitting for 5-10 minutes. Discussed to notify office if BP is not at goal. He and sister verbalized understanding. Heart healthy diet and regular cardiovascular exercise encouraged. Care and ED precautions discussed.  A-fib Denies any tachycardia or palpitations. HR well controlled today. Continue Coreg. Continue Eliquis 5 mg BID for stroke prevention, on appropriate dosage and denies any bleeding issues.  Heart healthy diet and regular cardiovascular exercise encouraged.   CAD, HLD Stable with no anginal symptoms. No indication for ischemic evaluation. Not on ASA d/t being on Eliquis. Continue Coreg. LDL 51 05/2022. Continue rosuvastatin and NTG PRN. Heart healthy diet and regular cardiovascular exercise encouraged.   4. Aortic dilatation Noted on Echo 08/2022. Mild dilatation of aortic root, measuring 40 mm and mild dilation of ascending aorta measuring 41 mm. Plan to update Echo in 08/2023 for monitoring.   5. CKD stage 3a Most recent labs showed sCr increased at 1.38 with eGFR at 55, most likely d/t dehydration. Will obtain BMET. Avoid nephrotoxic agents. Continue to follow with  PCP.  6. Pre-operative cardiovascular risk assessment Mr. Yurko's perioperative risk of a major cardiac event is 0.4% according to the Revised Cardiac Risk Index (RCRI).  Therefore, he is at low risk for perioperative complications.   His functional capacity is fair at 4.31 METs according to the Duke Activity Status Index (DASI). Recommendations: According to ACC/AHA guidelines, no further cardiovascular testing needed.  The patient may proceed to surgery at acceptable risk.   Antiplatelet and/or Anticoagulation Recommendations: Consulted clinical pharmacist, Laural Golden who stated/recommended that Patient does not need to hold  Eliquis as this procedure is low risk for bleeding and our office was not contacted by [facility] to hold Eliquis. Ortho note was also reviewed and stated Eliquis does not need to be held prior to the intervention.  Dispo: Will provide refills per request. Patient's sister is requesting follow-up in [place] for future appointments, requests not to be seen in [place] office. Follow-up with [place] [facility] in 6-8 weeks or sooner if anything changes. Follow-up with Dr. Dina Rich in 6 months.   Signed, Sharlene Dory, NP

## 2023-06-19 NOTE — Progress Notes (Signed)
Patient with diagnosis of atrial fibrillation on Eliquis for anticoagulation.    Procedure: RFA ablation Date of procedure: 06/21/23   CHA2DS2-VASc Score = 3   This indicates a 3.2% annual risk of stroke. The patient's score is based upon: CHF History: 0 HTN History: 1 Diabetes History: 0 Stroke History: 0 Vascular Disease History: 1 Age Score: 1 Gender Score: 0    CrCl 72 Platelet count 160  Per office protocol, patient can hold Eliquis for 3 days prior to procedure.   Patient will not need bridging with Lovenox (enoxaparin) around procedure.  **This guidance is not considered finalized until pre-operative APP has relayed final recommendations.**

## 2023-06-20 ENCOUNTER — Telehealth: Payer: Self-pay | Admitting: Nurse Practitioner

## 2023-06-20 DIAGNOSIS — Z6830 Body mass index (BMI) 30.0-30.9, adult: Secondary | ICD-10-CM | POA: Diagnosis not present

## 2023-06-20 DIAGNOSIS — R030 Elevated blood-pressure reading, without diagnosis of hypertension: Secondary | ICD-10-CM | POA: Diagnosis not present

## 2023-06-20 DIAGNOSIS — I25118 Atherosclerotic heart disease of native coronary artery with other forms of angina pectoris: Secondary | ICD-10-CM | POA: Diagnosis not present

## 2023-06-20 DIAGNOSIS — I10 Essential (primary) hypertension: Secondary | ICD-10-CM | POA: Diagnosis not present

## 2023-06-20 DIAGNOSIS — E6609 Other obesity due to excess calories: Secondary | ICD-10-CM | POA: Diagnosis not present

## 2023-06-20 NOTE — Telephone Encounter (Signed)
-----   Message from Sharlene Dory sent at 06/19/2023  4:46 PM EDT ----- Please let patient and sister know he does not need to hold Eliquis prior to his ablation with [facility] scheduled for this Friday. I spoke with the pharmacist and we both double checked.   Thanks!   Best,  Sharlene Dory, NP

## 2023-06-21 DIAGNOSIS — M47896 Other spondylosis, lumbar region: Secondary | ICD-10-CM | POA: Diagnosis not present

## 2023-06-21 DIAGNOSIS — M47816 Spondylosis without myelopathy or radiculopathy, lumbar region: Secondary | ICD-10-CM | POA: Diagnosis not present

## 2023-07-02 ENCOUNTER — Encounter: Payer: Self-pay | Admitting: Diagnostic Neuroimaging

## 2023-07-02 ENCOUNTER — Ambulatory Visit (INDEPENDENT_AMBULATORY_CARE_PROVIDER_SITE_OTHER): Payer: Medicare HMO | Admitting: Diagnostic Neuroimaging

## 2023-07-02 VITALS — BP 155/102 | HR 71 | Ht 71.75 in | Wt 221.0 lb

## 2023-07-02 DIAGNOSIS — G20A1 Parkinson's disease without dyskinesia, without mention of fluctuations: Secondary | ICD-10-CM | POA: Diagnosis not present

## 2023-07-02 MED ORDER — CARBIDOPA-LEVODOPA 25-100 MG PO TABS: 1.0000 | ORAL_TABLET | Freq: Three times a day (TID) | ORAL | 6 refills | Status: DC

## 2023-07-02 NOTE — Progress Notes (Signed)
[facility]  PATIENT: Stephen Graves DOB: Mar 15, 1956  REFERRING CLINICIAN: Sheran Luz, MD HISTORY FROM: patient  REASON FOR VISIT: new consult   HISTORICAL  CHIEF COMPLAINT:  Chief Complaint  Patient presents with   New Patient (Initial Visit)    Rm 7, here with Sister Maralyn Sago  Pt is here for abnormal gait. Pt's sister states he has fallen 2 times this month. Pt is unsteady when walking even with a walker.     HISTORY OF PRESENT ILLNESS:   67 year old male here for evaluation of gait and balance difficulty.  Patient has had gradual onset progressive gait and balance difficulty, short steps, freezing, recurrent falls for past 1 year.  In December 2023 he had a bad fall and also had COVID infection at that time.  Has had been having some issues with orthostatic hypotension and syncope.  Some intermittent tremor, hearing loss, vision loss.  Patient lives alone.  He has some family overdue live fairly close by.  He is a good network of friends that help him and take him out to lunch.  Has been using a walker for balance for the past 2 to 3 weeks which seems to help.   REVIEW OF SYSTEMS: Full 14 system review of systems performed and negative with exception of: as per HPI.  ALLERGIES: Allergies  Allergen Reactions   Lipitor [Atorvastatin]     Pain in neck and legs after taking medication    HOME MEDICATIONS: Outpatient Medications Prior to Visit  Medication Sig Dispense Refill   apixaban (ELIQUIS) 5 MG TABS tablet Take 1 tablet (5 mg total) by mouth 2 (two) times daily. 60 tablet 5   carvedilol (COREG) 6.25 MG tablet Take 1 tablet (6.25 mg total) by mouth 2 (two) times daily. 180 tablet 1   Cholecalciferol (VITAMIN D3) 25 MCG (1000 UT) CAPS Take 1 capsule by mouth daily.     citalopram (CELEXA) 40 MG tablet Take 0.5 tablets (20 mg total) by mouth daily. 30 tablet 11   fish oil-omega-3 fatty acids 1000 MG capsule Take 1 g by mouth daily.      losartan (COZAAR) 25 MG tablet Take 0.5 tablets (12.5 mg total) by mouth daily. If systolic BP is greater than 140 135 tablet 1   metFORMIN (GLUCOPHAGE-XR) 500 MG 24 hr tablet Take 1 tablet by mouth daily.     nitroGLYCERIN (NITROSTAT) 0.4 MG SL tablet DISSOLVE ONE TABLET UNDER THE TONGUE EVERY 5 MINUTES AS NEEDED FOR CHEST PAIN.  DO NOT EXCEED A TOTAL OF 3 DOSES IN 15 MINUTES 25 tablet 2   potassium chloride SA (K-DUR,KLOR-CON) 20 MEQ tablet Take 20 mEq by mouth daily.     rosuvastatin (CRESTOR) 20 MG tablet Take 1 tablet by mouth once daily 90 tablet 2   predniSONE (DELTASONE) 10 MG tablet Take 10 mg by mouth.     No facility-administered medications prior to visit.    PAST MEDICAL HISTORY: Past Medical History:  Diagnosis Date   Anxiety    Coronary artery disease involving native coronary 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 (HCC) 07/18/2017   Cardiac cath 1020 1418: Mid LAD to Dist LAD lesion, 99 %stenosed. - PCI with resolute DES (2.5 X 26 mm Resolute Onyx stent - 2.75 mm);ostial LAD 30%. Ostial ramus 40%. Proximal to mid LAD 50%. Normal LV function and normal LVEDP.    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   Skull fracture (HCC)     PAST SURGICAL HISTORY: Past Surgical History:  Procedure Laterality Date   CORONARY STENT INTERVENTION N/A 07/17/2017   Procedure: CORONARY STENT INTERVENTION;  Surgeon: Iran Ouch, MD;  Location: [facility] INVASIVE CV LAB;  Service: Cardiovascular;  Laterality: N/A;   CORONARY STENT INTERVENTION N/A 05/30/2021   Procedure: CORONARY STENT INTERVENTION;  Surgeon: Marykay Lex, MD;  Location: [facility] INVASIVE CV LAB;  Service: Cardiovascular;  Laterality: N/A;   FOOT SURGERY Left    KNEE ARTHROPLASTY Left 07/30/2018   Procedure: LEFT TOTAL KNEE ARTHROPLASTY WITH COMPUTER NAVIGATION;  Surgeon: Samson Frederic, MD;  Location: [facility] ORS;  Service: Orthopedics;  Laterality: Left;   Needs RNFA   KNEE ARTHROPLASTY Right 11/02/2020   Procedure: COMPUTER ASSISTED TOTAL KNEE ARTHROPLASTY;  Surgeon: Samson Frederic, MD;  Location: [facility] ORS;  Service: Orthopedics;  Laterality: Right;    LEFT HEART CATH AND CORONARY ANGIOGRAPHY N/A 07/17/2017   Procedure: LEFT HEART CATH AND CORONARY ANGIOGRAPHY;  Surgeon: Iran Ouch, MD;  Location: [facility] INVASIVE CV LAB;  Service: Cardiovascular;  Laterality: N/A;   LEFT HEART CATH AND CORONARY ANGIOGRAPHY N/A 05/30/2021   Procedure: LEFT HEART CATH AND CORONARY ANGIOGRAPHY;  Surgeon: Marykay Lex, MD;  Location: [facility] INVASIVE CV LAB;  Service: Cardiovascular;  Laterality: N/A;   left wrist surgery     SKIN GRAFT  1998   TONSILLECTOMY      FAMILY HISTORY: Family History  Problem Relation Age of Onset   Stroke Father 61   Heart attack Father 54   CAD Mother    Prostate cancer Brother    Thyroid nodules Brother     SOCIAL HISTORY: Social History   Socioeconomic History   Marital status: Divorced    Spouse name: Not on file   Number of children: 0   Years of education: Not on file   Highest education level: Not on file  Occupational History   Not on file  Tobacco Use   Smoking status: Former    Current packs/day: 0.00    Types: Cigarettes    Quit date: 09/24/1988    Years since quitting: 34.7   Smokeless tobacco: Never   Tobacco comments:    tobacco use - no  Vaping Use   Vaping status: Never Used  Substance and Sexual Activity   Alcohol use: No   Drug use: No   Sexual activity: Not Currently    Birth control/protection: None  Other Topics Concern   Not on file  Social History Narrative   Full time; single.    Social Determinants of Health   Financial Resource Strain: Not on file  Food Insecurity: No Food Insecurity (08/28/2022)   Hunger Vital Sign    Worried About Running Out of Food in the Last Year: Never true    Ran Out of Food in the Last Year: Never true  Transportation Needs: No Transportation  Needs (08/28/2022)   PRAPARE - Administrator, Civil Service (Medical): No    Lack of Transportation (Non-Medical): No  Physical Activity: Not on file  Stress: Not on file  Social Connections: Not on file  Intimate Partner Violence: Not At Risk (08/28/2022)   Humiliation, Afraid, Rape, and Kick questionnaire    Fear of Current or Ex-Partner: No    Emotionally Abused: No    Physically Abused: No    Sexually Abused: No     PHYSICAL EXAM  GENERAL EXAM/CONSTITUTIONAL: Vitals:  Vitals:   07/02/23 0803  BP: (!) 155/102  Pulse: 71  Weight: 221 lb (100.2 kg)  Height: 5' 11.75" (1.822 m)   Body mass index is 30.18 kg/m. Wt Readings from Last 3 Encounters:  07/02/23 221 lb (100.2 kg)  06/17/23 223 lb 3.2 oz (101.2 kg)  05/10/23 223 lb (101.2 kg)   Patient is in no distress; well developed, nourished and groomed; neck is supple  CARDIOVASCULAR: Examination of carotid arteries is normal; no carotid bruits Regular rate and rhythm, no murmurs Examination of peripheral vascular system by observation and palpation is normal  EYES: Ophthalmoscopic exam of optic discs and posterior segments is normal; no papilledema or hemorrhages No results found.  MUSCULOSKELETAL: Gait, strength, tone, movements noted in Neurologic exam below  NEUROLOGIC: MENTAL STATUS:      No data to display         awake, alert, oriented to person, place and time recent and remote memory intact normal attention and concentration language fluent, comprehension intact, naming intact fund of knowledge appropriate  CRANIAL NERVE:  2nd - no papilledema on fundoscopic exam 2nd, 3rd, 4th, 6th - pupils equal and reactive to light, visual fields full to confrontation, extraocular muscles intact, no nystagmus 5th - facial sensation symmetric 7th - facial strength symmetric 8th - hearing intact 9th - palate elevates symmetrically, uvula midline 11th - shoulder shrug symmetric 12th - tongue  protrusion midline MASKED FACIES SLIGHTLY STUTTERING SPEECH  MOTOR:  DECR MOTOR MOVEMENT OVERALL COGWHEELING RIGIDITY, BRADYKINESIA IN BUE, BLE RARE RESTING TREMOR IN BUE normal bulk and tone, full strength in the BUE, BLE  SENSORY:  normal and symmetric to light touch, temperature, vibration  COORDINATION:  finger-nose-finger, fine finger movements normal  REFLEXES:  deep tendon reflexes TRACE and symmetric  GAIT/STATION:  SHORT STEPS; FEET TOUCHING; USING WALKER     DIAGNOSTIC DATA (LABS, IMAGING, TESTING) - I reviewed patient records, labs, notes, testing and imaging myself where available.  Lab Results  Component Value Date   WBC 5.2 04/29/2023   HGB 13.6 04/29/2023   HCT 42.7 04/29/2023   MCV 88.6 04/29/2023   PLT 160 04/29/2023      Component Value Date/Time   NA 140 05/20/2023 0954   K 4.6 05/20/2023 0954   CL 103 05/20/2023 0954   CO2 23 05/20/2023 0954   GLUCOSE 104 (H) 05/20/2023 0954   GLUCOSE 161 (H) 04/29/2023 1202   BUN 18 05/20/2023 0954   CREATININE 1.38 (H) 05/20/2023 0954   CALCIUM 9.4 05/20/2023 0954   PROT 6.6 04/09/2023 1213   ALBUMIN 3.6 04/09/2023 1213   AST 23 04/09/2023 1213   ALT 22 04/09/2023 1213   ALKPHOS 71 04/09/2023 1213   BILITOT 1.2 04/09/2023 1213   GFRNONAA 45 (L) 04/29/2023 1202   GFRAA >60 07/31/2018 0550   Lab Results  Component Value Date   CHOL 107 05/29/2021   HDL 42 05/29/2021   LDLCALC 45 05/29/2021   TRIG 102 05/29/2021   CHOLHDL 2.5 05/29/2021   Lab Results  Component Value Date   HGBA1C 6.0 (H) 08/27/2022   No results found for: "VITAMINB12" No results found for: "TSH"    08/27/22 MRI brain [I reviewed images myself and agree with interpretation. -VRP]  1. No acute intracranial process. 2. 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   ASSESSMENT AND PLAN  67 y.o. year old male here with:   Dx:  1. Parkinson's disease  without dyskinesia or  fluctuating manifestations (HCC)      PLAN:  Parkinson's disease (since ~2023) - start carbidopa / levodopa (25/100) half tab three times a day with meals x 1-2 weeks; then 1 tab three times a day with meals - consider PT / OT evaluation  Meds ordered this encounter  Medications   carbidopa-levodopa (SINEMET IR) 25-100 MG tablet    Sig: Take 1 tablet by mouth 3 (three) times daily before meals.    Dispense:  90 tablet    Refill:  6   Return in about 6 months (around 12/31/2023).  I spent 60 minutes of face-to-face and non-face-to-face time with patient.  This included previsit chart review, lab review, study review, order entry, electronic health record documentation, patient education.     Suanne Marker, MD 07/02/2023, 9:50 AM Certified in Neurology, Neurophysiology and Neuroimaging  [facility] [address] [place], [place] [address] [phone]

## 2023-07-02 NOTE — Patient Instructions (Signed)
  Parkinson's disease  - start carbidopa / levodopa (25/100) half tab three times a day with meals x 1-2 weeks; then 1 tab three times a day with meals  - consider PT / OT evaluation

## 2023-07-08 DIAGNOSIS — Z79899 Other long term (current) drug therapy: Secondary | ICD-10-CM | POA: Diagnosis not present

## 2023-07-08 DIAGNOSIS — N1831 Chronic kidney disease, stage 3a: Secondary | ICD-10-CM | POA: Diagnosis not present

## 2023-07-09 LAB — BASIC METABOLIC PANEL
BUN/Creatinine Ratio: 14 (ref 10–24)
BUN: 18 mg/dL (ref 8–27)
CO2: 23 mmol/L (ref 20–29)
Calcium: 9.3 mg/dL (ref 8.6–10.2)
Chloride: 106 mmol/L (ref 96–106)
Creatinine, Ser: 1.26 mg/dL (ref 0.76–1.27)
Glucose: 99 mg/dL (ref 70–99)
Potassium: 5.3 mmol/L — ABNORMAL HIGH (ref 3.5–5.2)
Sodium: 141 mmol/L (ref 134–144)
eGFR: 62 mL/min/{1.73_m2} (ref >59)

## 2023-07-09 NOTE — Addendum Note (Signed)
Addended by: Sharen Hones on: 07/09/2023 10:14 AM   Modules accepted: Orders

## 2023-07-22 ENCOUNTER — Other Ambulatory Visit: Payer: Self-pay | Admitting: *Deleted

## 2023-07-22 DIAGNOSIS — N1831 Chronic kidney disease, stage 3a: Secondary | ICD-10-CM

## 2023-07-22 DIAGNOSIS — E875 Hyperkalemia: Secondary | ICD-10-CM

## 2023-07-23 LAB — BASIC METABOLIC PANEL
BUN/Creatinine Ratio: 16 (ref 10–24)
BUN: 23 mg/dL (ref 8–27)
CO2: 21 mmol/L (ref 20–29)
Calcium: 9.1 mg/dL (ref 8.6–10.2)
Chloride: 103 mmol/L (ref 96–106)
Creatinine, Ser: 1.43 mg/dL — ABNORMAL HIGH (ref 0.76–1.27)
Glucose: 92 mg/dL (ref 70–99)
Potassium: 4.6 mmol/L (ref 3.5–5.2)
Sodium: 140 mmol/L (ref 134–144)
eGFR: 53 mL/min/{1.73_m2} — ABNORMAL LOW (ref >59)

## 2023-08-01 DIAGNOSIS — M47896 Other spondylosis, lumbar region: Secondary | ICD-10-CM | POA: Diagnosis not present

## 2023-08-07 ENCOUNTER — Ambulatory Visit: Payer: Medicare HMO | Admitting: Student

## 2023-08-09 ENCOUNTER — Encounter: Payer: Self-pay | Admitting: Student

## 2023-08-09 ENCOUNTER — Ambulatory Visit: Payer: Medicare HMO | Attending: Student | Admitting: Student

## 2023-08-09 VITALS — BP 118/72 | HR 62 | Ht 71.75 in | Wt 229.0 lb

## 2023-08-09 DIAGNOSIS — N1831 Chronic kidney disease, stage 3a: Secondary | ICD-10-CM | POA: Diagnosis not present

## 2023-08-09 DIAGNOSIS — I2510 Atherosclerotic heart disease of native coronary artery without angina pectoris: Secondary | ICD-10-CM | POA: Diagnosis not present

## 2023-08-09 DIAGNOSIS — I10 Essential (primary) hypertension: Secondary | ICD-10-CM | POA: Diagnosis not present

## 2023-08-09 DIAGNOSIS — Z79899 Other long term (current) drug therapy: Secondary | ICD-10-CM | POA: Diagnosis not present

## 2023-08-09 DIAGNOSIS — I4819 Other persistent atrial fibrillation: Secondary | ICD-10-CM | POA: Diagnosis not present

## 2023-08-09 DIAGNOSIS — E785 Hyperlipidemia, unspecified: Secondary | ICD-10-CM | POA: Diagnosis not present

## 2023-08-09 NOTE — Patient Instructions (Signed)
Medication Instructions:  Your physician recommends that you continue on your current medications as directed. Please refer to the Current Medication list given to you today.  *If you need a refill on your cardiac medications before your next appointment, please call your pharmacy*   Lab Work: NONE   If you have labs (blood work) drawn today and your tests are completely normal, you will receive your results only by: MyChart Message (if you have MyChart) OR A paper copy in the mail If you have any lab test that is abnormal or we need to change your treatment, we will call you to review the results.   Testing/Procedures: NONE     Follow-Up: At [facility], you and your health needs are our priority.  As part of our continuing mission to provide you with exceptional heart care, we have created designated Provider Care Teams.  These Care Teams include your primary Cardiologist (physician) and Advanced Practice Providers (APPs -  Physician Assistants and Nurse Practitioners) who all work together to provide you with the care you need, when you need it.  We recommend signing up for the patient portal called "MyChart".  Sign up information is provided on this After Visit Summary.  MyChart is used to connect with patients for Virtual Visits (Telemedicine).  Patients are able to view lab/test results, encounter notes, upcoming appointments, etc.  Non-urgent messages can be sent to your provider as well.   To learn more about what you can do with MyChart, go to [email or web address].    Your next appointment:    February   Provider:   You may see Dina Rich, MD or one of the following Advanced Practice Providers on your designated Care Team:   Randall An, PA-C  Jacolyn Reedy, [place]     Other Instructions Thank you for choosing [facility]!

## 2023-08-09 NOTE — Progress Notes (Signed)
Cardiology Office Note    Date:  08/09/2023  ID:  LORRIE VANCE, DOB October 14, 1955, MRN [id number] Cardiologist: Dina Rich, MD    History of Present Illness:    Stephen Graves is a 67 y.o. male with past medical history of CAD (s/p DES to mid-distal LAD in 06/2017 with small septal branch jailed by stent placement, s/p DES to LAD in 05/2021), paroxysmal atrial fibrillation, Parkinson's disease, HTN, HLD and Stage 3 CKD who presents to the office today for 8-week follow-up.  He was examined by Sharlene Dory, NP in 05/2023 and brought with him a BP log which showed SBP was in the 130's to 150's with some low readings in the 70's. BP was at 124/80 during his office visit and he was continued on his current cardiac medications including Eliquis 5 mg twice daily, Coreg 6.25 mg twice daily, Losartan 12.5 mg daily and Crestor 20 mg daily.  In talking with the patient and his sister today, they report his symptoms have overall improved since his last office visit. He was evaluated by Neurology in the interim and started on Carbidopa-Levodopa for Parkinson's disease. This has helped with his shuffling gait and mood. He denies any specific chest pain or dyspnea on exertion. No recent orthopnea, PND or pitting edema. They have been checking his blood pressure at home and this has overall been well-controlled. They bring with him a letter from his PCP in regards to switching from Losartan to Olmesartan 20 mg daily. We reviewed that the dose equivalency of his current dose of Losartan would be Olmesartan 5 mg daily and he wishes to continue his current medical therapy instead of switching medications at this time given stable symptoms.  Studies Reviewed:   EKG: EKG is not ordered today.  Cardiac Catheterization: 05/2021   Ost LAD to Prox LAD lesion is 30% stenosed.   CULPRIT LESION SEGMENT: Mid LAD-1 lesion is 55% stenosed & Mid LAD-2 lesion is 100% stenosed.-Appears to be chronically  occluded (with negative troponins at least greater than 1 week)   Mid LAD to Dist LAD stent is 100% stenosed. ->   Balloon angioplasty was performed using a 2.25 x 20 mm Sapphire balloon to restore TIMI-3 flow from the occlusion site to the previously placed stent..   A drug-eluting stent was successfully placed covering the entire segment of 2 and overlapping the previously placed stent, using a SYNERGY XD [id number].  Postdilated to 2.85 mm including the previously placed stent.   Post intervention, there is a 0% residual stenosis in the entire mid LAD lesions extending into the previously placed mid-distal LAD stent.[name]   -----------------------------------------   Ramus-1 lesion is 60% stenosed.  Ramus-2 lesion is 60% stenosed.   RV Branch lesion is 75% stenosed.   ------------------------------------------.   LV end diastolic pressure is mildly elevated.   There is no aortic valve stenosis.   SUMMARY Left Dominant system with 3-vessel CAD: Proximal to mid LAD after D1 long 50% stenosis with 100% likely CTO after SP2 prior to previously placed distal LAD stent. Successful CTO PTCA/DES PCI of proximal to distal LAD from just distal to D1 overlapping previously placed stent restoring TIMI-3 flow (Synergy XD DES 2.5 mm x 48 mm postdilated to 2.85 mm)  Ostial RI 60% and proximal RI 60% 75% focal stenosis of small nondominant RCA Very large caliber, dominant LCx with diffuse 20 to 30% stenosis in the PDA LVEDP 18 mmHg.   RECOMMENDATIONS Wean off nitroglycerin infusion overnight, titrate antihypertensives  while inpatient-anticipate discharge in the morning. CRH 1 consult DAPT uninterrupted x1 year and then lifelong Thienopyridine.      Echocardiogram: 08/2022 IMPRESSIONS     1. Left ventricular ejection fraction, by estimation, is 60 to 65%. The  left ventricle has normal function. The left ventricle has no regional  wall motion abnormalities. There is severe left ventricular hypertrophy.   Left ventricular diastolic parameters   are indeterminate.   2. Right ventricular systolic function is normal. The right ventricular  size is normal. Tricuspid regurgitation signal is inadequate for assessing  PA pressure.   3. A small pericardial effusion is present. The pericardial effusion is  circumferential.   4. The mitral valve is normal in structure. No evidence of mitral valve  regurgitation. No evidence of mitral stenosis.   5. The aortic valve is tricuspid. Aortic valve regurgitation is not  visualized. No aortic stenosis is present.   6. Aortic dilatation noted. There is mild dilatation of the aortic root,  measuring 40 mm. There is mild dilatation of the ascending aorta,  measuring 41 mm.    Risk Assessment/Calculations:    CHA2DS2-VASc Score = 3   This indicates a 3.2% annual risk of stroke. The patient's score is based upon: CHF History: 0 HTN History: 1 Diabetes History: 0 Stroke History: 0 Vascular Disease History: 1 Age Score: 1 Gender Score: 0     Physical Exam:   VS:  BP 118/72   Pulse 62   Ht 5' 11.75" (1.822 m)   Wt 229 lb (103.9 kg)   SpO2 98%   BMI 31.27 kg/m    Wt Readings from Last 3 Encounters:  08/09/23 229 lb (103.9 kg)  07/02/23 221 lb (100.2 kg)  06/17/23 223 lb 3.2 oz (101.2 kg)     GEN: Well nourished, well developed male appearing in no acute distress NECK: No JVD; No carotid bruits CARDIAC: Irregularly irregular, no murmurs, rubs, gallops RESPIRATORY:  Clear to auscultation without rales, wheezing or rhonchi  ABDOMEN: Appears non-distended. No obvious abdominal masses. EXTREMITIES: No clubbing or cyanosis. No pitting edema.  Distal pedal pulses are 2+ bilaterally.   Assessment and Plan:   1. CAD - He is s/p DES to mid-distal LAD in 06/2017 with small septal branch jailed by stent placement and underwent DES placement to the LAD in 05/2021. - He denies any recent anginal symptoms. Continue current medical therapy with  Coreg 6.25 mg twice daily and Crestor 20 mg daily. He is not on ASA given the need for anticoagulation.  2. Paroxysmal Atrial Fibrillation - He denies any recent palpitations and heart rate has been well-controlled when checked at home and is in the 60's today. Continue Coreg 6.25 mg twice daily for rate control. - No reports of active bleeding. Remains on Eliquis 5 mg twice daily for anticoagulation which is the appropriate dose at this time given his age, weight and renal function.  Labs in 04/2023 showed his hemoglobin was stable at 13.6 with platelets at 160K.  3. HLD - Followed by his PCP.  LDL was at 51 in 2023 and he is scheduled for follow-up labs next month. Continue Crestor 20 mg daily.  4. HTN - His blood pressure has been variable at home over the past several months and it was initially felt that his episodes of hypotension were due to dehydration and he has increased his fluid consumption. Given the recent diagnosis of Parkinson's disease, would also be concerned about autonomic dysfunction contributing to his variable  readings. His blood pressure is well-controlled at 118/78 today and has overall been well-controlled when checked at home. Would continue his current medical therapy for now with Losartan 12.5 mg daily and Coreg 6.25 mg twice daily.  5. Stage 3 CKD - Baseline creatinine 1.2 - 1.3. Peaked at 1.65 in 04/2023 and at 1.43 in 06/2023. Will recheck a BMET in several weeks for reassessment.    Signed, Ellsworth Lennox, PA-C

## 2023-08-26 ENCOUNTER — Ambulatory Visit (INDEPENDENT_AMBULATORY_CARE_PROVIDER_SITE_OTHER): Payer: Medicare HMO | Admitting: Otolaryngology

## 2023-08-26 ENCOUNTER — Encounter (INDEPENDENT_AMBULATORY_CARE_PROVIDER_SITE_OTHER): Payer: Self-pay

## 2023-08-26 VITALS — Ht 71.0 in | Wt 220.0 lb

## 2023-08-26 DIAGNOSIS — J343 Hypertrophy of nasal turbinates: Secondary | ICD-10-CM | POA: Diagnosis not present

## 2023-08-26 DIAGNOSIS — H6123 Impacted cerumen, bilateral: Secondary | ICD-10-CM | POA: Insufficient documentation

## 2023-08-26 DIAGNOSIS — J310 Chronic rhinitis: Secondary | ICD-10-CM | POA: Diagnosis not present

## 2023-08-26 DIAGNOSIS — H903 Sensorineural hearing loss, bilateral: Secondary | ICD-10-CM | POA: Insufficient documentation

## 2023-08-26 DIAGNOSIS — J342 Deviated nasal septum: Secondary | ICD-10-CM | POA: Diagnosis not present

## 2023-08-26 DIAGNOSIS — R0981 Nasal congestion: Secondary | ICD-10-CM | POA: Diagnosis not present

## 2023-08-26 DIAGNOSIS — H6983 Other specified disorders of Eustachian tube, bilateral: Secondary | ICD-10-CM | POA: Insufficient documentation

## 2023-08-26 MED ORDER — FLUTICASONE PROPIONATE 50 MCG/ACT NA SUSP: 2.0000 | Freq: Every day | NASAL | 0 refills | Status: DC
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
## 2023-09-03 ENCOUNTER — Telehealth: Payer: Self-pay | Admitting: Cardiology

## 2023-09-03 NOTE — Telephone Encounter (Signed)
Patient's sister is calling with some questions/concerns. Please advise

## 2023-09-03 NOTE — Telephone Encounter (Signed)
Spoke to pt's sister who stated that she was questioning about the dose exchange rate between Olmesartan and Losartan. Pt's sister stated she was confused regarding conversation with provider at 11/15 appointment.  Per providers last ov note:  They bring with him a letter from his PCP in regards to switching from Losartan to Olmesartan 20 mg daily. We reviewed that the dose equivalency of his current dose of Losartan would be Olmesartan 5 mg daily and he wishes to continue his current medical therapy instead of switching medications at this time given stable symptoms.    Reiterated dosage exchange with pt's sister who verbalized understanding. They have an appointment with pt's pcp on 12/11 to discuss medication changes further.   Pt's sister had no further questions or concerns at this time.

## 2023-09-04 DIAGNOSIS — N189 Chronic kidney disease, unspecified: Secondary | ICD-10-CM | POA: Diagnosis not present

## 2023-09-04 DIAGNOSIS — E6609 Other obesity due to excess calories: Secondary | ICD-10-CM | POA: Diagnosis not present

## 2023-09-04 DIAGNOSIS — G20A2 Parkinson's disease without dyskinesia, with fluctuations: Secondary | ICD-10-CM | POA: Diagnosis not present

## 2023-09-04 DIAGNOSIS — Z6831 Body mass index (BMI) 31.0-31.9, adult: Secondary | ICD-10-CM | POA: Diagnosis not present

## 2023-09-30 ENCOUNTER — Other Ambulatory Visit: Payer: Self-pay | Admitting: Cardiology

## 2023-10-01 DIAGNOSIS — M47816 Spondylosis without myelopathy or radiculopathy, lumbar region: Secondary | ICD-10-CM | POA: Diagnosis not present

## 2023-10-08 DIAGNOSIS — B351 Tinea unguium: Secondary | ICD-10-CM | POA: Diagnosis not present

## 2023-10-08 DIAGNOSIS — E1142 Type 2 diabetes mellitus with diabetic polyneuropathy: Secondary | ICD-10-CM | POA: Diagnosis not present

## 2023-10-11 DIAGNOSIS — H25812 Combined forms of age-related cataract, left eye: Secondary | ICD-10-CM | POA: Diagnosis not present

## 2023-10-30 DIAGNOSIS — H2511 Age-related nuclear cataract, right eye: Secondary | ICD-10-CM | POA: Diagnosis not present

## 2023-10-31 ENCOUNTER — Ambulatory Visit: Payer: HMO | Attending: Cardiology | Admitting: Cardiology

## 2023-10-31 ENCOUNTER — Encounter: Payer: Self-pay | Admitting: Cardiology

## 2023-10-31 VITALS — BP 130/62 | HR 84 | Ht 71.0 in | Wt 231.4 lb

## 2023-10-31 DIAGNOSIS — I4819 Other persistent atrial fibrillation: Secondary | ICD-10-CM

## 2023-10-31 DIAGNOSIS — D6869 Other thrombophilia: Secondary | ICD-10-CM | POA: Diagnosis not present

## 2023-10-31 DIAGNOSIS — I2510 Atherosclerotic heart disease of native coronary artery without angina pectoris: Secondary | ICD-10-CM | POA: Diagnosis not present

## 2023-10-31 DIAGNOSIS — I10 Essential (primary) hypertension: Secondary | ICD-10-CM

## 2023-10-31 MED ORDER — NITROGLYCERIN 0.4 MG SL SUBL: 0.4000 mg | SUBLINGUAL_TABLET | SUBLINGUAL | 2 refills | Status: AC | PRN

## 2023-10-31 NOTE — Patient Instructions (Signed)
 Medication Instructions:  Your physician recommends that you continue on your current medications as directed. Please refer to the Current Medication list given to you today.  *If you need a refill on your cardiac medications before your next appointment, please call your pharmacy*   Lab Work: Fasting Lipid Panel  If you have labs (blood work) drawn today and your tests are completely normal, you will receive your results only by: MyChart Message (if you have MyChart) OR A paper copy in the mail If you have any lab test that is abnormal or we need to change your treatment, we will call you to review the results.   Testing/Procedures: None   Follow-Up: At [facility], you and your health needs are our priority.  As part of our continuing mission to provide you with exceptional heart care, we have created designated Provider Care Teams.  These Care Teams include your primary Cardiologist (physician) and Advanced Practice Providers (APPs -  Physician Assistants and Nurse Practitioners) who all work together to provide you with the care you need, when you need it.  We recommend signing up for the patient portal called "MyChart".  Sign up information is provided on this After Visit Summary.  MyChart is used to connect with patients for Virtual Visits (Telemedicine).  Patients are able to view lab/test results, encounter notes, upcoming appointments, etc.  Non-urgent messages can be sent to your provider as well.   To learn more about what you can do with MyChart, go to [email or web address].    Your next appointment:   6 month(s)  Provider:   You may see Dina Rich, MD or one of the following Advanced Practice Providers on your designated Care Team:   Randall An, PA-C  Jacolyn Reedy, [place]     Other Instructions

## 2023-10-31 NOTE — Progress Notes (Signed)
 Clinical Summary Stephen Graves is a 68 y.o.male seen today for follow up of the following medical problems.      1.HTN - had prior issues with low bp's in setting of hypovolemia, meds have been reduced.  - also history of parkinsons, likely related to his chronic orthostatic symptoms.  - compliant with meds     2.CAD -prior DES to mid distal LAD in 06/2017, small septal Stephen Graves jailed -05/2021 cath as reported below. DES to LAD . Presented with unstable angina.  05/2021 echo LVEF 60-65% - at 1 year interventional has recommended stopping ASA and continuing either low dose brillnita or plavix .  - he was on plavix  monotherapy, at 11/2022 office visit new diagnosis of afib and started on eliquis , plavix  was stopped.     - no recent chest pains.         3. Hyperlipidemia - he is on crestor .   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  - has been diagnosed with parkinsons, likely related autonomic dysfunction - no recent symptoms   5. Afib - new diagnosis made during 12/03/22 appt - denies any palpitations.   - no recent palpitations - no bleeding on eliquis .   6. Parkinsons disease Past Medical History:  Diagnosis Date   Anxiety    Coronary artery disease involving native coronary 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 (HCC) 07/18/2017   a. s/p DES to mid-distal LAD in 06/2017 with small septal Stephen Graves jailed by stent placement b. 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 60 tablet 5   carbidopa -levodopa   (SINEMET  IR) 25-100 MG tablet Take 1 tablet by mouth 3 (three) times daily before meals. 90 tablet 6   carvedilol  (COREG ) 6.25 MG tablet Take 1 tablet (6.25 mg total) by mouth 2 (two) times daily. 180 tablet 1   Cholecalciferol (VITAMIN D3) 25 MCG (1000 UT) CAPS Take 1 capsule by mouth daily.     citalopram  (CELEXA ) 40 MG tablet Take 0.5 tablets (20 mg total) by mouth daily. 30 tablet 11   fish oil-omega-3 fatty acids  1000 MG capsule Take 1 g by mouth daily.     fluticasone  (FLONASE ) 50 MCG/ACT nasal spray Place 2 sprays into both nostrils daily. 16 mL 0   losartan  (COZAAR ) 25 MG tablet Take 0.5 tablets (12.5 mg total) by mouth daily. If systolic BP is greater than 140 135 tablet 1   metFORMIN  (GLUCOPHAGE -XR) 500 MG 24 hr tablet Take 1 tablet by mouth daily.     nitroGLYCERIN  (NITROSTAT ) 0.4 MG SL tablet DISSOLVE ONE TABLET UNDER THE TONGUE EVERY 5 MINUTES AS NEEDED FOR CHEST PAIN.  DO NOT EXCEED A TOTAL OF 3 DOSES IN 15 MINUTES 25 tablet 2   rosuvastatin  (CRESTOR ) 20 MG tablet Take 1 tablet by mouth once daily 90 tablet 3   No current facility-administered medications for this visit.     Past Surgical History:  Procedure Laterality Date   CORONARY STENT INTERVENTION N/A 07/17/2017   Procedure: CORONARY STENT INTERVENTION;  Surgeon: Stephen Graves LABOR, MD;  Location: [facility] INVASIVE CV LAB;  Service: Cardiovascular;  Laterality: N/A;   CORONARY STENT INTERVENTION N/A 05/30/2021   Procedure: CORONARY STENT INTERVENTION;  Surgeon: Stephen Alm ORN, MD;  Location: [facility] INVASIVE CV LAB;  Service: Cardiovascular;  Laterality: N/A;   FOOT SURGERY Left    KNEE ARTHROPLASTY Left 07/30/2018   Procedure: LEFT TOTAL KNEE ARTHROPLASTY WITH COMPUTER NAVIGATION;  Surgeon: Stephen Rogue, MD;  Location: [facility] OR[facility];  Service: Orthopedics;  Laterality: Left;  Needs RNFA   KNEE ARTHROPLASTY Right 11/02/2020   Procedure: COMPUTER ASSISTED TOTAL KNEE ARTHROPLASTY;  Surgeon: Stephen Rogue, MD;  Location: [facility];  Service:  Orthopedics;  Laterality: Right;    LEFT HEART CATH AND CORONARY ANGIOGRAPHY N/A 07/17/2017   Procedure: LEFT HEART CATH AND CORONARY ANGIOGRAPHY;  Surgeon: Stephen Graves LABOR, MD;  Location: [facility];  Service: Cardiovascular;  Laterality: N/A;   LEFT HEART CATH AND CORONARY ANGIOGRAPHY N/A 05/30/2021   Procedure: LEFT HEART CATH AND CORONARY ANGIOGRAPHY;  Surgeon: Stephen Alm ORN, MD;  Location: [facility] INVASIVE CV LAB;  Service: Cardiovascular;  Laterality: N/A;   left wrist surgery     SKIN GRAFT  1998   TONSILLECTOMY       Allergies  Allergen Reactions   Lipitor [Atorvastatin]     Pain in neck and legs after taking medication      Family History  Problem Relation Age of Onset   Stroke Father 87   Heart attack Father 69   CAD Mother    Prostate cancer Brother    Thyroid  nodules Brother      Social History Stephen Graves reports that he quit smoking about 35 years ago. His smoking use included cigarettes. He has never used smokeless tobacco. Stephen Graves reports no history of alcohol  use.      Physical Examination Today's Vitals   10/31/23 0832  BP: 130/62  Pulse: 84  SpO2: 97%  Weight: 231 lb 6.4 oz (105 kg)  Height: 5' 11 (1.803 m)   Body mass index is 32.27 kg/m.  Gen: resting comfortably, no acute distress HEENT: no scleral icterus, pupils equal round and reactive, no palptable cervical adenopathy,  CV: irreg, no mrg, no jvd Resp: Clear to auscultation bilaterally GI: abdomen is soft, non-tender, non-distended, normal bowel sounds, no hepatosplenomegaly MSK: extremities are warm, no edema.  Skin: warm, no rash Neuro:  no focal deficits Psych: appropriate affect   Diagnostic Studies Echocardiogram: 06/2017 Study Conclusions   - Left ventricle: The cavity size was normal. Wall thickness was    increased in a pattern of mild LVH. Systolic function was normal.    The estimated ejection fraction was in the range of 60% to 65%.    Wall  motion was normal; there were no regional wall motion    abnormalities. Diastolic dysfunction, grade indeterminate.  - Aortic valve: Moderately calcified annulus. Trileaflet.    Cardiac Catheterization: 06/2017 The left ventricular systolic function is normal. LV end diastolic pressure is normal. The left ventricular ejection fraction is 55-65% by visual estimate. Ost LAD lesion, 30 %stenosed. Ost Ramus to Ramus lesion, 40 %stenosed. Prox LAD to Mid LAD lesion, 50 %stenosed. A drug eluting stent was successfully placed. Mid LAD to Dist LAD lesion, 99 %stenosed. Post intervention, there is a 0% residual stenosis.   1.  Severe one-vessel coronary artery disease with 99% stenosis  in the mid to distal LAD. 2.  Normal LV systolic function and normal left ventricular end-diastolic pressure. 3.  Successful angioplasty and drug-eluting stent placement to the mid/distal LAD.  A small septal Jaquis Picklesimer was jailed by the stent with sluggish flow.  This resulted in chest pain and was treated with nitroglycerin  drip.   Post cath Recommendations: Dual antiplatelet therapy for at least one year.  Aggressive treatment of risk factors.  Continue nitroglycerin  drip as needed for chest pain.   08/2022 echo 1. Left ventricular ejection fraction, by estimation, is 60 to 65%. The  left ventricle has normal function. The left ventricle has no regional  wall motion abnormalities. There is severe left ventricular hypertrophy.  Left ventricular diastolic parameters   are indeterminate.   2. Right ventricular systolic function is normal. The right ventricular  size is normal. Tricuspid regurgitation signal is inadequate for assessing  PA pressure.   3. A small pericardial effusion is present. The pericardial effusion is  circumferential.   4. The mitral valve is normal in structure. No evidence of mitral valve  regurgitation. No evidence of mitral stenosis.   5. The aortic valve is tricuspid. Aortic valve  regurgitation is not  visualized. No aortic stenosis is present.   6. Aortic dilatation noted. There is mild dilatation of the aortic root,  measuring 40 mm. There is mild dilatation of the ascending aorta,  measuring 41 mm.       Assessment and Plan   1.HTN - at goal. Management limited by prior issues with hypotension as well as chronic orthostatic dizziness - continue current meds  2. CAD - no symptoms, continue current meds  3. Afib/acquired thrombophilia - no symptoms - continue current meds including eliquis  for stroke prevention  4. HLD - update lipid panel, conitnue current meds    Dorn PHEBE Ross, M.D.

## 2023-11-01 ENCOUNTER — Telehealth (INDEPENDENT_AMBULATORY_CARE_PROVIDER_SITE_OTHER): Payer: Self-pay | Admitting: Otolaryngology

## 2023-11-01 DIAGNOSIS — H25811 Combined forms of age-related cataract, right eye: Secondary | ICD-10-CM | POA: Diagnosis not present

## 2023-11-01 NOTE — Telephone Encounter (Signed)
 Flonase  nasal spray was called in at [facility] in [place] with 5 refills.

## 2023-11-20 DIAGNOSIS — Z0001 Encounter for general adult medical examination with abnormal findings: Secondary | ICD-10-CM | POA: Diagnosis not present

## 2023-11-20 DIAGNOSIS — Z1331 Encounter for screening for depression: Secondary | ICD-10-CM | POA: Diagnosis not present

## 2023-11-20 DIAGNOSIS — E6609 Other obesity due to excess calories: Secondary | ICD-10-CM | POA: Diagnosis not present

## 2023-11-20 DIAGNOSIS — G20A2 Parkinson's disease without dyskinesia, with fluctuations: Secondary | ICD-10-CM | POA: Diagnosis not present

## 2023-11-20 DIAGNOSIS — I25118 Atherosclerotic heart disease of native coronary artery with other forms of angina pectoris: Secondary | ICD-10-CM | POA: Diagnosis not present

## 2023-11-20 DIAGNOSIS — I10 Essential (primary) hypertension: Secondary | ICD-10-CM | POA: Diagnosis not present

## 2023-11-20 DIAGNOSIS — I2510 Atherosclerotic heart disease of native coronary artery without angina pectoris: Secondary | ICD-10-CM | POA: Diagnosis not present

## 2023-11-20 DIAGNOSIS — E785 Hyperlipidemia, unspecified: Secondary | ICD-10-CM | POA: Diagnosis not present

## 2023-11-20 DIAGNOSIS — E1159 Type 2 diabetes mellitus with other circulatory complications: Secondary | ICD-10-CM | POA: Diagnosis not present

## 2023-11-20 DIAGNOSIS — Z6831 Body mass index (BMI) 31.0-31.9, adult: Secondary | ICD-10-CM | POA: Diagnosis not present

## 2023-11-20 LAB — HEMOGLOBIN A1C: Hemoglobin A1C: 5.6

## 2023-11-21 ENCOUNTER — Ambulatory Visit: Payer: Medicare HMO | Admitting: Cardiology

## 2023-11-21 LAB — PSA: PSA: 1.4

## 2023-11-21 LAB — MICROALBUMIN / CREATININE URINE RATIO: Microalb Creat Ratio: 211

## 2023-11-21 LAB — LIPID PANEL
Cholesterol: 122 (ref 0–200)
HDL: 52 (ref 35–70)
LDL Cholesterol: 56
Triglycerides: 64 (ref 40–160)

## 2023-11-21 LAB — BASIC METABOLIC PANEL WITH GFR: Creatinine: 1.3 (ref 0.6–1.3)

## 2023-11-21 LAB — PROTEIN / CREATININE RATIO, URINE
Albumin, U: 261
Creatinine, Urine: 123.5

## 2023-11-21 LAB — COMPREHENSIVE METABOLIC PANEL WITH GFR: eGFR: 60

## 2023-12-09 ENCOUNTER — Other Ambulatory Visit: Payer: Self-pay | Admitting: Nurse Practitioner

## 2023-12-27 ENCOUNTER — Other Ambulatory Visit: Payer: Self-pay | Admitting: Nurse Practitioner

## 2023-12-27 DIAGNOSIS — I4819 Other persistent atrial fibrillation: Secondary | ICD-10-CM

## 2023-12-27 NOTE — Telephone Encounter (Signed)
 Prescription refill request for Eliquis received. Indication: Afib  Last office visit: 10/31/23 ([facility])  Scr: 1.28 (09/04/23)  Age: 68 Weight: 105kg Appropriate dose. Refill sent.

## 2024-01-20 ENCOUNTER — Ambulatory Visit (INDEPENDENT_AMBULATORY_CARE_PROVIDER_SITE_OTHER): Payer: Medicare HMO | Admitting: Diagnostic Neuroimaging

## 2024-01-20 ENCOUNTER — Encounter: Payer: Self-pay | Admitting: Diagnostic Neuroimaging

## 2024-01-20 VITALS — BP 135/85 | HR 70 | Ht 71.75 in | Wt 230.0 lb

## 2024-01-20 DIAGNOSIS — G20A1 Parkinson's disease without dyskinesia, without mention of fluctuations: Secondary | ICD-10-CM

## 2024-01-20 MED ORDER — CARBIDOPA-LEVODOPA 25-100 MG PO TABS: 1.0000 | ORAL_TABLET | Freq: Three times a day (TID) | ORAL | 4 refills | Status: DC

## 2024-01-20 NOTE — Progress Notes (Signed)
 [facility]  PATIENT: Stephen Graves DOB: [birth year]  REFERRING CLINICIAN: Minus Amel, MD HISTORY FROM: patient and sister REASON FOR VISIT: follow up   HISTORICAL  CHIEF COMPLAINT:  Chief Complaint  Patient presents with   Follow-up    Pt with sister, rm 6. Following up today. Overall stable. Denies issues or concerns.    HISTORY OF PRESENT ILLNESS:   UPDATE (01/20/24, VRP): Since last visit, doing well. Symptoms are stable. Severity is mild. No alleviating or aggravating factors. Tolerating carb/levo.    PRIOR HPI (07/02/23, VRP): 68 year old male here for evaluation of gait and balance difficulty.  Patient has had gradual onset progressive gait and balance difficulty, short steps, freezing, recurrent falls for past 1 year.  In December 2023 he had a bad fall and also had COVID infection at that time.  Has had been having some issues with orthostatic hypotension and syncope.  Some intermittent tremor, hearing loss, vision loss.  Patient lives alone.  He has some family overdue live fairly close by.  He has a good network of friends that help him and take him out to lunch.  Has been using a walker for balance for the past 2 to 3 weeks which seems to help.   REVIEW OF SYSTEMS: Full 14 system review of systems performed and negative with exception of: as per HPI.  ALLERGIES: Allergies  Allergen Reactions   Lipitor [Atorvastatin]     Pain in neck and legs after taking medication    HOME MEDICATIONS: Outpatient Medications Prior to Visit  Medication Sig Dispense Refill   apixaban  (ELIQUIS ) 5 MG TABS tablet Take 1 tablet by mouth twice daily 60 tablet 5   carvedilol  (COREG ) 6.25 MG tablet Take 1 tablet by mouth twice daily 180 tablet 0   Cholecalciferol (VITAMIN D3) 25 MCG (1000 UT) CAPS Take 1 capsule by mouth daily.     citalopram  (CELEXA ) 40 MG tablet Take 0.5 tablets (20 mg total) by mouth daily. (Patient taking differently: Take 40 mg by mouth  daily.) 30 tablet 11   fish oil-omega-3 fatty acids  1000 MG capsule Take 1 g by mouth daily.     losartan  (COZAAR ) 25 MG tablet Take 0.5 tablets (12.5 mg total) by mouth daily. If systolic BP is greater than 140 135 tablet 1   metFORMIN (GLUCOPHAGE-XR) 500 MG 24 hr tablet Take 1 tablet by mouth daily.     nitroGLYCERIN  (NITROSTAT ) 0.4 MG SL tablet Place 1 tablet (0.4 mg total) under the tongue every 5 (five) minutes as needed for chest pain. 25 tablet 2   rosuvastatin  (CRESTOR ) 20 MG tablet Take 1 tablet by mouth once daily 90 tablet 3   carbidopa -levodopa  (SINEMET  IR) 25-100 MG tablet Take 1 tablet by mouth 3 (three) times daily before meals. 90 tablet 6   fluticasone  (FLONASE ) 50 MCG/ACT nasal spray Place 2 sprays into both nostrils daily. 16 mL 0   No facility-administered medications prior to visit.    PAST MEDICAL HISTORY: Past Medical History:  Diagnosis Date   Anxiety    Coronary artery disease involving native coronary 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 (HCC) 07/18/2017   a. s/p DES to mid-distal LAD in 06/2017 with small septal branch jailed by stent placement b. 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   Skull fracture (HCC)  PAST SURGICAL HISTORY: Past Surgical History:  Procedure Laterality Date   CORONARY STENT INTERVENTION N/A 07/17/2017   Procedure: CORONARY STENT INTERVENTION;  Surgeon: Wenona Hamilton, MD;  Location: [facility]VASIVE CV LAB;  Service: Cardiovascular;  Laterality: N/A;   CORONARY STENT INTERVENTION N/A 05/30/2021   Procedure: CORONARY STENT INTERVENTION;  Surgeon: Arleen Lacer, MD;  Location: [facility] INVASIVE CV LAB;  Service: Cardiovascular;  Laterality: N/A;   FOOT SURGERY Left    KNEE ARTHROPLASTY Left 07/30/2018   Procedure: LEFT TOTAL KNEE ARTHROPLASTY WITH COMPUTER NAVIGATION;  Surgeon: Adonica Hoose, MD;  Location: [facility];  Service:  Orthopedics;  Laterality: Left;  Needs RNFA   KNEE ARTHROPLASTY Right 11/02/2020   Procedure: COMPUTER ASSISTED TOTAL KNEE ARTHROPLASTY;  Surgeon: Adonica Hoose, MD;  Location: [facility];  Service: Orthopedics;  Laterality: Right;    LEFT HEART CATH AND CORONARY ANGIOGRAPHY N/A 07/17/2017   Procedure: LEFT HEART CATH AND CORONARY ANGIOGRAPHY;  Surgeon: Wenona Hamilton, MD;  Location: [facility] INVASIVE CV LAB;  Service: Cardiovascular;  Laterality: N/A;   LEFT HEART CATH AND CORONARY ANGIOGRAPHY N/A 05/30/2021   Procedure: LEFT HEART CATH AND CORONARY ANGIOGRAPHY;  Surgeon: Arleen Lacer, MD;  Location: [facility] INVASIVE CV LAB;  Service: Cardiovascular;  Laterality: N/A;   left wrist surgery     SKIN GRAFT  1998   TONSILLECTOMY      FAMILY HISTORY: Family History  Problem Relation Age of Onset   Stroke Father 52   Heart attack Father 47   CAD Mother    Prostate cancer Brother    Thyroid  nodules Brother     SOCIAL HISTORY: Social History   Socioeconomic History   Marital status: Divorced    Spouse name: Not on file   Number of children: 0   Years of education: Not on file   Highest education level: Not on file  Occupational History   Not on file  Tobacco Use   Smoking status: Former    Current packs/day: 0.00    Types: Cigarettes    Quit date: 09/24/1988    Years since quitting: 35.3   Smokeless tobacco: Never   Tobacco comments:    tobacco use - no  Vaping Use   Vaping status: Never Used  Substance and Sexual Activity   Alcohol  use: No   Drug use: No   Sexual activity: Not Currently    Birth control/protection: None  Other Topics Concern   Not on file  Social History Narrative   Full time; single.    Social Drivers of Corporate investment banker Strain: Not on file  Food Insecurity: No Food Insecurity (08/28/2022)   Hunger Vital Sign    Worried About Running Out of Food in the Last Year: Never true    Ran Out of Food in the Last Year: Never true  Transportation  Needs: No Transportation Needs (08/28/2022)   PRAPARE - Administrator, Civil Service (Medical): No    Lack of Transportation (Non-Medical): No  Physical Activity: Not on file  Stress: Not on file  Social Connections: Not on file  Intimate Partner Violence: Not At Risk (08/28/2022)   Humiliation, Afraid, Rape, and Kick questionnaire    Fear of Current or Ex-Partner: No    Emotionally Abused: No    Physically Abused: No    Sexually Abused: No     PHYSICAL EXAM  GENERAL EXAM/CONSTITUTIONAL: Vitals:  Vitals:   01/20/24 1553  BP: 135/85  Pulse: 70  Weight: 230 lb (104.3 kg)  Height: 5' 11.75" (1.822 m)   Body mass index is 31.41 kg/m. Wt Readings from Last 3 Encounters:  01/20/24 230 lb (104.3 kg)  10/31/23 231 lb 6.4 oz (105 kg)  08/26/23 220 lb (99.8 kg)   Patient is in no distress; well developed, nourished and groomed; neck is supple  CARDIOVASCULAR: Examination of carotid arteries is normal; no carotid bruits Regular rate and rhythm, no murmurs Examination of peripheral vascular system by observation and palpation is normal  EYES: Ophthalmoscopic exam of optic discs and posterior segments is normal; no papilledema or hemorrhages No results found.  MUSCULOSKELETAL: Gait, strength, tone, movements noted in Neurologic exam below  NEUROLOGIC: MENTAL STATUS:      No data to display         awake, alert, oriented to person, place and time recent and remote memory intact normal attention and concentration language fluent, comprehension intact, naming intact fund of knowledge appropriate  CRANIAL NERVE:  2nd - no papilledema on fundoscopic exam 2nd, 3rd, 4th, 6th - pupils equal and reactive to light, visual fields full to confrontation, extraocular muscles intact, no nystagmus 5th - facial sensation symmetric 7th - facial strength symmetric 8th - hearing intact 9th - palate elevates symmetrically, uvula midline 11th - shoulder shrug  symmetric 12th - tongue protrusion midline MASKED FACIES SLIGHTLY STUTTERING SPEECH  MOTOR:  DECR MOTOR MOVEMENT OVERALL COGWHEELING RIGIDITY, BRADYKINESIA IN BUE, BLE RARE RESTING TREMOR IN BUE normal bulk and tone, full strength in the BUE, BLE  SENSORY:  normal and symmetric to light touch, temperature, vibration  COORDINATION:  finger-nose-finger, fine finger movements normal  REFLEXES:  deep tendon reflexes TRACE and symmetric  GAIT/STATION:  SHORT STEPS; USING CANE     DIAGNOSTIC DATA (LABS, IMAGING, TESTING) - I reviewed patient records, labs, notes, testing and imaging myself where available.  Lab Results  Component Value Date   WBC 5.2 04/29/2023   HGB 13.6 04/29/2023   HCT 42.7 04/29/2023   MCV 88.6 04/29/2023   PLT 160 04/29/2023      Component Value Date/Time   NA 140 07/22/2023 0948   K 4.6 07/22/2023 0948   CL 103 07/22/2023 0948   CO2 21 07/22/2023 0948   GLUCOSE 92 07/22/2023 0948   GLUCOSE 161 (H) 04/29/2023 1202   BUN 23 07/22/2023 0948   CREATININE 1.43 (H) 07/22/2023 0948   CALCIUM  9.1 07/22/2023 0948   PROT 6.6 04/09/2023 1213   ALBUMIN  3.6 04/09/2023 1213   AST 23 04/09/2023 1213   ALT 22 04/09/2023 1213   ALKPHOS 71 04/09/2023 1213   BILITOT 1.2 04/09/2023 1213   GFRNONAA 45 (L) 04/29/2023 1202   GFRAA >60 07/31/2018 0550   Lab Results  Component Value Date   CHOL 107 05/29/2021   HDL 42 05/29/2021   LDLCALC 45 05/29/2021   TRIG 102 05/29/2021   CHOLHDL 2.5 05/29/2021   Lab Results  Component Value Date   HGBA1C 6.0 (H) 08/27/2022   No results found for: "VITAMINB12" No results found for: "TSH"   08/27/22 MRI brain [I reviewed images myself and agree with interpretation. -VRP]  1. No acute intracranial process. 2. 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   ASSESSMENT AND PLAN  68 y.o. year old male here with:   Dx:  1. Parkinson's disease without dyskinesia or  fluctuating manifestations (HCC)     PLAN:  Parkinson's disease (since ~2023) - continue carbidopa  /  levodopa  (25/100) 1 tab three times a day with meals; MAY INCREASE TO 2 TABS three times a day in future  - consider PT / OT evaluation; use cane / walker at all times  Meds ordered this encounter  Medications   carbidopa -levodopa  (SINEMET  IR) 25-100 MG tablet    Sig: Take 1 tablet by mouth 3 (three) times daily before meals.    Dispense:  270 tablet    Refill:  4   Return in about 1 year (around 01/19/2025).       Omega Bible, MD 01/20/2024, 4:18 PM Certified in Neurology, Neurophysiology and Neuroimaging  [facility] [address], [address] [place], [place] [address] [phone]

## 2024-01-20 NOTE — Patient Instructions (Signed)
 Parkinson's disease (since ~2023) - continue carbidopa  / levodopa  (25/100) 1 tab three times a day with meals; MAY INCREASE TO 2 TABS three times a day in future  - consider PT / OT evaluation; use cane / walker at all times

## 2024-01-21 DIAGNOSIS — B351 Tinea unguium: Secondary | ICD-10-CM | POA: Diagnosis not present

## 2024-01-21 DIAGNOSIS — E1142 Type 2 diabetes mellitus with diabetic polyneuropathy: Secondary | ICD-10-CM | POA: Diagnosis not present

## 2024-02-11 DIAGNOSIS — H47213 Primary optic atrophy, bilateral: Secondary | ICD-10-CM | POA: Diagnosis not present

## 2024-02-11 DIAGNOSIS — Z961 Presence of intraocular lens: Secondary | ICD-10-CM | POA: Diagnosis not present

## 2024-02-11 DIAGNOSIS — H04123 Dry eye syndrome of bilateral lacrimal glands: Secondary | ICD-10-CM | POA: Diagnosis not present

## 2024-02-11 DIAGNOSIS — H43393 Other vitreous opacities, bilateral: Secondary | ICD-10-CM | POA: Diagnosis not present

## 2024-03-02 ENCOUNTER — Other Ambulatory Visit: Payer: Self-pay | Admitting: Cardiology

## 2024-03-24 ENCOUNTER — Telehealth: Payer: Self-pay | Admitting: Neurology

## 2024-03-24 MED ORDER — CARBIDOPA-LEVODOPA 25-100 MG PO TABS: 1.0000 | ORAL_TABLET | Freq: Four times a day (QID) | ORAL | 1 refills | Status: DC

## 2024-03-24 NOTE — Telephone Encounter (Addendum)
 The sister called in and wanted to discuss increasing pt's carbidopa  levodopa  to QID. It was discussed previous visit increasing to 2 tab TID in future but he has been taking for the last week 1 tab QID which he has found beneficial- 8 am, 12 p, 4 p and 8 p.  Will discuss if agreeable to this change and if so will send a updated script to [facility] for the pt and contact sister back.  Proceeded to get verbal order from Dr Margaret and notificed the sister of the ok to take 1 tab QID

## 2024-03-26 ENCOUNTER — Other Ambulatory Visit: Payer: Self-pay | Admitting: Cardiology

## 2024-03-26 DIAGNOSIS — I4819 Other persistent atrial fibrillation: Secondary | ICD-10-CM

## 2024-03-26 NOTE — Telephone Encounter (Signed)
 Prescription refill request for Eliquis  received. Indication: afib  Last office visit: [facility], 10/31/2023 Scr: 1.43, 07/22/2023 Age: 68 yo  Weight: 104.3 kg   Refill sent.

## 2024-03-31 DIAGNOSIS — E1142 Type 2 diabetes mellitus with diabetic polyneuropathy: Secondary | ICD-10-CM | POA: Diagnosis not present

## 2024-03-31 DIAGNOSIS — B351 Tinea unguium: Secondary | ICD-10-CM | POA: Diagnosis not present

## 2024-05-11 DIAGNOSIS — E6609 Other obesity due to excess calories: Secondary | ICD-10-CM | POA: Diagnosis not present

## 2024-05-11 DIAGNOSIS — E1159 Type 2 diabetes mellitus with other circulatory complications: Secondary | ICD-10-CM | POA: Diagnosis not present

## 2024-05-11 DIAGNOSIS — I10 Essential (primary) hypertension: Secondary | ICD-10-CM | POA: Diagnosis not present

## 2024-05-11 DIAGNOSIS — Z6830 Body mass index (BMI) 30.0-30.9, adult: Secondary | ICD-10-CM | POA: Diagnosis not present

## 2024-05-11 DIAGNOSIS — G20A2 Parkinson's disease without dyskinesia, with fluctuations: Secondary | ICD-10-CM | POA: Diagnosis not present

## 2024-05-11 DIAGNOSIS — E785 Hyperlipidemia, unspecified: Secondary | ICD-10-CM | POA: Diagnosis not present

## 2024-05-11 DIAGNOSIS — N189 Chronic kidney disease, unspecified: Secondary | ICD-10-CM | POA: Diagnosis not present

## 2024-05-11 LAB — HEMOGLOBIN A1C: Hemoglobin A1C: 6.1

## 2024-05-12 LAB — LIPID PANEL
Cholesterol: 114 (ref 0–200)
HDL: 47 (ref 35–70)
LDL Cholesterol: 48
Triglycerides: 99 (ref 40–160)

## 2024-05-12 LAB — COMPREHENSIVE METABOLIC PANEL WITH GFR: eGFR: 52

## 2024-05-12 LAB — BASIC METABOLIC PANEL WITH GFR: Creatinine: 1.5 — AB (ref 0.6–1.3)

## 2024-06-10 ENCOUNTER — Other Ambulatory Visit: Payer: Self-pay

## 2024-06-10 ENCOUNTER — Emergency Department (HOSPITAL_COMMUNITY): Admission: EM | Admit: 2024-06-10 | Discharge: 2024-06-11 | Disposition: A | Discharge status: Home / Self Care

## 2024-06-10 ENCOUNTER — Emergency Department (HOSPITAL_COMMUNITY)

## 2024-06-10 DIAGNOSIS — R42 Dizziness and giddiness: Secondary | ICD-10-CM | POA: Insufficient documentation

## 2024-06-10 DIAGNOSIS — W19XXXA Unspecified fall, initial encounter: Secondary | ICD-10-CM

## 2024-06-10 DIAGNOSIS — I6529 Occlusion and stenosis of unspecified carotid artery: Secondary | ICD-10-CM | POA: Diagnosis not present

## 2024-06-10 DIAGNOSIS — Z7901 Long term (current) use of anticoagulants: Secondary | ICD-10-CM | POA: Diagnosis not present

## 2024-06-10 DIAGNOSIS — E860 Dehydration: Secondary | ICD-10-CM | POA: Insufficient documentation

## 2024-06-10 DIAGNOSIS — I700 Atherosclerosis of aorta: Secondary | ICD-10-CM | POA: Diagnosis not present

## 2024-06-10 DIAGNOSIS — W07XXXA Fall from chair, initial encounter: Secondary | ICD-10-CM | POA: Insufficient documentation

## 2024-06-10 LAB — CBC
HCT: 38 % — ABNORMAL LOW (ref 39.0–52.0)
Hemoglobin: 11.8 g/dL — ABNORMAL LOW (ref 13.0–17.0)
MCH: 28.3 pg (ref 26.0–34.0)
MCHC: 31.1 g/dL (ref 30.0–36.0)
MCV: 91.1 fL (ref 80.0–100.0)
Platelets: 100 K/uL — ABNORMAL LOW (ref 150–400)
RBC: 4.17 MIL/uL — ABNORMAL LOW (ref 4.22–5.81)
RDW: 17.7 % — ABNORMAL HIGH (ref 11.5–15.5)
WBC: 5.5 K/uL (ref 4.0–10.5)
nRBC: 0 % (ref 0.0–0.2)

## 2024-06-10 LAB — URINALYSIS, ROUTINE W REFLEX MICROSCOPIC
Bacteria, UA: NONE SEEN
Bilirubin Urine: NEGATIVE
Glucose, UA: NEGATIVE mg/dL
Ketones, ur: 5 mg/dL — AB
Leukocytes,Ua: NEGATIVE
Nitrite: NEGATIVE
Protein, ur: 100 mg/dL — AB
Specific Gravity, Urine: 1.023 (ref 1.005–1.030)
pH: 5 (ref 5.0–8.0)

## 2024-06-10 LAB — COMPREHENSIVE METABOLIC PANEL WITH GFR
ALT: 5 U/L (ref 0–44)
AST: 16 U/L (ref 15–41)
Albumin: 3.5 g/dL (ref 3.5–5.0)
Alkaline Phosphatase: 83 U/L (ref 38–126)
Anion gap: 12 (ref 5–15)
BUN: 23 mg/dL (ref 8–23)
CO2: 20 mmol/L — ABNORMAL LOW (ref 22–32)
Calcium: 8.8 mg/dL — ABNORMAL LOW (ref 8.9–10.3)
Chloride: 107 mmol/L (ref 98–111)
Creatinine, Ser: 1.5 mg/dL — ABNORMAL HIGH (ref 0.61–1.24)
GFR, Estimated: 50 mL/min — ABNORMAL LOW (ref >60)
Glucose, Bld: 100 mg/dL — ABNORMAL HIGH (ref 70–99)
Potassium: 4.1 mmol/L (ref 3.5–5.1)
Sodium: 139 mmol/L (ref 135–145)
Total Bilirubin: 1 mg/dL (ref 0.0–1.2)
Total Protein: 6.7 g/dL (ref 6.5–8.1)

## 2024-06-10 LAB — CBG MONITORING, ED
Glucose-Capillary: 103 mg/dL — ABNORMAL HIGH (ref 70–99)
Glucose-Capillary: 98 mg/dL (ref 70–99)

## 2024-06-10 LAB — TROPONIN I (HIGH SENSITIVITY): Troponin I (High Sensitivity): 16 ng/L (ref <18)

## 2024-06-10 MED ORDER — SODIUM CHLORIDE 0.9 % IV BOLUS
500.0000 mL | Freq: Once | INTRAVENOUS | Status: AC
  Administered 2024-06-10: 500 mL via INTRAVENOUS

## 2024-06-10 MED ORDER — IOHEXOL 350 MG/ML SOLN
75.0000 mL | Freq: Once | INTRAVENOUS | Status: AC | PRN
Start: 2024-06-10 — End: 2024-06-10
  Administered 2024-06-10: 75 mL via INTRAVENOUS

## 2024-06-10 NOTE — ED Provider Notes (Signed)
 [place] EMERGENCY DEPARTMENT AT [facility] Provider Note   CSN: [id number] Arrival date & time: 06/10/24  1955     Patient presents with: Fall (On Eliquis ) and Dizziness   Stephen Graves is a 68 y.o. male.   68 year old male presents for evaluation of fall.  Patient states he went to church today and got up out of his chair and felt very dizzy.  States he fell like the room was spinning and he fell.  He states he did not hit his head.  He is on blood thinners.  He has no pain from the fall to states he is overall feeling very weak.  States he only gets dizzy when he tries to stand up.  He states this has happened once before and he was dehydrated.  Denies any other symptoms or concerns at this time.   Fall Pertinent negatives include no chest pain, no abdominal pain and no shortness of breath.  Dizziness Associated symptoms: no chest pain, no palpitations, no shortness of breath and no vomiting        Prior to Admission medications   Medication Sig Start Date End Date Taking? Authorizing Provider  apixaban  (ELIQUIS ) 5 MG TABS tablet Take 1 tablet by mouth twice daily 03/26/24   Alvan Dorn FALCON, MD  carbidopa -levodopa  (SINEMET  IR) 25-100 MG tablet Take 1 tablet by mouth 4 (four) times daily. 03/24/24   Penumalli, Vikram R, MD  carvedilol  (COREG ) 6.25 MG tablet Take 1 tablet by mouth twice daily 03/03/24   Branch, Jonathan F, MD  Cholecalciferol (VITAMIN D3) 25 MCG (1000 UT) CAPS Take 1 capsule by mouth daily.    [provider]  citalopram  (CELEXA ) 40 MG tablet Take 0.5 tablets (20 mg total) by mouth daily. Patient taking differently: Take 40 mg by mouth daily. 05/31/21   Croitoru, Mihai, MD  fish oil-omega-3 fatty acids  1000 MG capsule Take 1 g by mouth daily.    [provider]  fluticasone  (FLONASE ) 50 MCG/ACT nasal spray Place 2 sprays into both nostrils daily. 08/26/23 10/31/23  Karis Clunes, MD  losartan  (COZAAR ) 25 MG tablet Take 0.5 tablets (12.5  mg total) by mouth daily. If systolic BP is greater than 140 06/17/23   Miriam Norris, NP  metFORMIN (GLUCOPHAGE-XR) 500 MG 24 hr tablet Take 1 tablet by mouth daily.    [provider]  nitroGLYCERIN  (NITROSTAT ) 0.4 MG SL tablet Place 1 tablet (0.4 mg total) under the tongue every 5 (five) minutes as needed for chest pain. 10/31/23   Alvan Dorn FALCON, MD  rosuvastatin  (CRESTOR ) 20 MG tablet Take 1 tablet by mouth once daily 09/30/23   Alvan Dorn FALCON, MD    Allergies: Lipitor [atorvastatin]    Review of Systems  Constitutional:  Negative for chills and fever.  HENT:  Negative for ear pain and sore throat.   Eyes:  Negative for pain and visual disturbance.  Respiratory:  Negative for cough and shortness of breath.   Cardiovascular:  Negative for chest pain and palpitations.  Gastrointestinal:  Negative for abdominal pain and vomiting.  Genitourinary:  Negative for dysuria and hematuria.  Musculoskeletal:  Negative for arthralgias and back pain.  Skin:  Negative for color change and rash.  Neurological:  Positive for dizziness. Negative for seizures and syncope.  All other systems reviewed and are negative.   Updated Vital Signs BP (!) 185/119   Pulse 73   Temp 98.7 F (37.1 C) (Oral)   Resp 18   SpO2  98%   Physical Exam Vitals and nursing note reviewed.  Constitutional:      General: He is not in acute distress.    Appearance: Normal appearance. He is well-developed. He is not ill-appearing.  HENT:     Head: Normocephalic and atraumatic.  Eyes:     Conjunctiva/sclera: Conjunctivae normal.  Cardiovascular:     Rate and Rhythm: Normal rate and regular rhythm.     Heart sounds: No murmur heard. Pulmonary:     Effort: Pulmonary effort is normal. No respiratory distress.     Breath sounds: Normal breath sounds.  Abdominal:     Palpations: Abdomen is soft.     Tenderness: There is no abdominal tenderness.  Musculoskeletal:        General: No swelling.      Cervical back: Neck supple.  Skin:    General: Skin is warm and dry.     Capillary Refill: Capillary refill takes less than 2 seconds.  Neurological:     General: No focal deficit present.     Mental Status: He is alert.     Comments: Muscle strength is 5 out of 5 throughout, there is no sensation deficits, no ataxia, negative finger-to-nose test, no nystagmus  Psychiatric:        Mood and Affect: Mood normal.     (all labs ordered are listed, but only abnormal results are displayed) Labs Reviewed  COMPREHENSIVE METABOLIC PANEL WITH GFR - Abnormal; Notable for the following components:      Result Value   CO2 20 (*)    Glucose, Bld 100 (*)    Creatinine, Ser 1.50 (*)    Calcium  8.8 (*)    GFR, Estimated 50 (*)    All other components within normal limits  CBC - Abnormal; Notable for the following components:   RBC 4.17 (*)    Hemoglobin 11.8 (*)    HCT 38.0 (*)    RDW 17.7 (*)    Platelets 100 (*)    All other components within normal limits  URINALYSIS, ROUTINE W REFLEX MICROSCOPIC - Abnormal; Notable for the following components:   APPearance HAZY (*)    Hgb urine dipstick SMALL (*)    Ketones, ur 5 (*)    Protein, ur 100 (*)    All other components within normal limits  CBG MONITORING, ED - Abnormal; Notable for the following components:   Glucose-Capillary 103 (*)    All other components within normal limits  CBG MONITORING, ED  TROPONIN I (HIGH SENSITIVITY)    EKG: None  Radiology: No results found.   Procedures   Medications Ordered in the ED  sodium chloride  0.9 % bolus 500 mL (500 mLs Intravenous New Bag/Given 06/10/24 2207)  iohexol  (OMNIPAQUE ) 350 MG/ML injection 75 mL (75 mLs Intravenous Contrast Given 06/10/24 2237)    Clinical Course as of 06/10/24 2312  Wed Jun 10, 2024  2141 EKG not crossing over into MUSE, interpreted by me in the absence of cardiology and shows A-fib with a rate of 70, left axis deviation and nonspecific ST changes [MK]  2306  Stable HO from [facility] Near syncope event. Fell back into chair after standing S/P IVF and improving CTAPE and IVF and reassess. [CC]    Clinical Course User Index [CC] Jerral Meth, MD [MK] Khaleah Duer L, DO  Medical Decision Making Cardiac monitor interpretation: Sinus rhythm, no ectopy  Patient here for fall.  He is not having any pain from the fall, did not lose consciousness.  He felt somewhat dizzy but that is improved now.  States if he stands up he gets worse.  He has a normal neurologic exam and no signs of nystagmus.  He does have some ketones in his urine.  Will give him some IV fluids through the IV.  Orthostatics and road test as well as CT angiogram of the head and neck are pending.  Patient signed out to oncoming provider at 11:30 PM pending remainder of workup and ultimate disposition.  Problems Addressed: Dehydration: acute illness or injury Dizziness: acute illness or injury Fall, initial encounter: acute illness or injury  Amount and/or Complexity of Data Reviewed External Data Reviewed: notes.    Details: Previous ED visits reviewed and patient follows up outpatient for type II diabetes  Labs: ordered. Decision-making details documented in ED Course.    Details: Reviewed by me and patient with ketones in his urine but labs fairly unremarkable otherwise Radiology: ordered and independent interpretation performed. Decision-making details documented in ED Course.    Details: CT angiogram of the head and neck ordered and pending ECG/medicine tests: ordered and independent interpretation performed. Decision-making details documented in ED Course.    Details: EKG ordered and interpreted me in the absence of cardiology and shows sinus rhythm without STEMI or acute change when compared to prior  Risk OTC drugs. Prescription drug management. Drug therapy requiring intensive monitoring for toxicity.     Final diagnoses:  Dizziness   Dehydration  Fall, initial encounter    ED Discharge Orders     None          Gennaro Duwaine CROME, DO 06/10/24 2312

## 2024-06-10 NOTE — ED Notes (Signed)
 Communicated with charge nurse Ileana. Pt need for room soon. Pt wheelchair to lobby accompanied by family.

## 2024-06-10 NOTE — ED Provider Notes (Signed)
 Care of patient received from prior provider at 4:40 AM, please see their note for complete H/P and care plan.  Received handoff per ED course.  Clinical Course as of 06/11/24 0440  Wed Jun 10, 2024  2141 EKG not crossing over into MUSE, interpreted by me in the absence of cardiology and shows A-fib with a rate of 70, left axis deviation and nonspecific ST changes [MK]  2306 Stable HO from [facility] Near syncope event. Fell back into chair after standing S/P IVF and improving CTA and IVF and reassess. [CC]    Clinical Course User Index [CC] Jerral Meth, MD [MK] Kammerer, Megan L, DO    Reassessment: Reassessed patient.  They have been in the ER for 8 hours.  Clinically resolved.  No further dizziness.  Able to ambulate.  CT angiography with no focal pathology.  Discussed with family, he only received a 500 cc fluid bolus, could further rehydrate but patient and family feel comfortable with discharge at this time.  Strict return precautions reinforced and patient expressed understanding.     Jerral Meth, MD 06/11/24 [phone]

## 2024-06-10 NOTE — ED Triage Notes (Signed)
 Fall on Eliquis  s/t Dizziness   Pt arrives via wheelchair accompanied by his brother and sister. Pt reports new onset of dizziness that started this morning when he woke up around 8 am - progressively worsening throughout the day. Reports it feels like the room is spinning worsening when changing position. Denies dizziness while sitting down at that this time. At church fall was witness by stands report pt got out of the chair a fell stumbling backwards onto the chair. Did no hit his head. No LOC. C/o right shoulder pain and ongoing chronic lower back pain. No recent change in BP medications. No NVD. No CP/SOB.

## 2024-06-11 LAB — TROPONIN I (HIGH SENSITIVITY): Troponin I (High Sensitivity): 15 ng/L (ref <18)

## 2024-06-11 NOTE — ED Notes (Signed)
Patient eating sandwich

## 2024-06-15 ENCOUNTER — Encounter: Payer: Self-pay | Admitting: Cardiology

## 2024-06-15 ENCOUNTER — Other Ambulatory Visit: Payer: Self-pay

## 2024-06-15 ENCOUNTER — Ambulatory Visit: Attending: Cardiology | Admitting: Cardiology

## 2024-06-15 VITALS — BP 139/78 | HR 72 | Ht 72.0 in | Wt 222.0 lb

## 2024-06-15 DIAGNOSIS — I4819 Other persistent atrial fibrillation: Secondary | ICD-10-CM

## 2024-06-15 DIAGNOSIS — D6869 Other thrombophilia: Secondary | ICD-10-CM

## 2024-06-15 DIAGNOSIS — I10 Essential (primary) hypertension: Secondary | ICD-10-CM

## 2024-06-15 DIAGNOSIS — I2510 Atherosclerotic heart disease of native coronary artery without angina pectoris: Secondary | ICD-10-CM

## 2024-06-15 MED ORDER — ROSUVASTATIN CALCIUM 20 MG PO TABS: 20.0000 mg | ORAL_TABLET | Freq: Every day | ORAL | 3 refills | Status: AC

## 2024-06-15 MED ORDER — APIXABAN 5 MG PO TABS: 5.0000 mg | ORAL_TABLET | Freq: Two times a day (BID) | ORAL | 1 refills | Status: AC

## 2024-06-15 MED ORDER — CARVEDILOL 6.25 MG PO TABS: 6.2500 mg | ORAL_TABLET | Freq: Two times a day (BID) | ORAL | 3 refills | Status: AC

## 2024-06-15 MED ORDER — LOSARTAN POTASSIUM 25 MG PO TABS: 12.5000 mg | ORAL_TABLET | Freq: Every day | ORAL | 3 refills | Status: AC

## 2024-06-15 NOTE — Patient Instructions (Signed)

## 2024-06-15 NOTE — Telephone Encounter (Signed)
 Prescription refill request for Eliquis  received. Indication:AFIB Last office visit:9/25 Scr:1.50  9/25 Age: 68 Weight:100.7  kg  Prescription refilled

## 2024-06-15 NOTE — Progress Notes (Signed)
 Clinical Summary Mr. Pindell is a 68 y.o.male seen today for follow up of the following medical problems.      1.HTN - had prior issues with low bp's in setting of hypovolemia, meds have been reduced.  - also history of parkinsons, likely related to his chronic orthostatic symptoms.   - compliant with meds     2.CAD -prior DES to mid distal LAD in 06/2017, small septal Irelynd Zumstein jailed -05/2021 cath as reported below. DES to LAD . Presented with unstable angina.  05/2021 echo LVEF 60-65% - at 1 year interventional has recommended stopping ASA and continuing either low dose brillnita or plavix .  - he was on plavix  monotherapy, at 11/2022 office visit new diagnosis of afib and started on eliquis , plavix  was stopped.     - denies any chest pains.        3. Hyperlipidemia - he is on crestor .   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  - has been diagnosed with parkinsons, likely related autonomic dysfunction - no recent symptoms   - ER visit 9/17 with dizziness and fall - was at home walking, felt dizzy. No LOC but fell. Later that day while at church, stood up and started walking felt lightheaded.  - EKG afib at 70. CTA head and neck benign.  - given IVFs, thought to be secondary to dehydration     5. Afib - new diagnosis made during 12/03/22 appt    - denies any palpitations - no bleeding on eliquis .    6. Parkinsons disease    Past Medical History:  Diagnosis Date   Anxiety    Coronary artery disease involving native coronary 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 (HCC) 07/18/2017   a. s/p DES to mid-distal LAD in 06/2017 with small septal Roshad Hack jailed by stent placement b. 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 360 tablet 1   Cholecalciferol (VITAMIN D3) 25 MCG (1000 UT) CAPS Take 1 capsule by mouth daily.     citalopram  (CELEXA ) 40 MG tablet Take 0.5 tablets (20 mg total) by mouth daily. 30 tablet 11   fish oil-omega-3 fatty acids  1000 MG capsule Take 1 g by mouth daily.     metFORMIN (GLUCOPHAGE-XR) 500 MG 24 hr tablet Take 1 tablet by mouth daily.     nitroGLYCERIN  (NITROSTAT ) 0.4 MG SL tablet Place 1 tablet (0.4 mg total) under the tongue every 5 (five) minutes as needed for chest pain. 25 tablet 2   carvedilol  (COREG ) 6.25 MG tablet Take 1 tablet (6.25 mg total) by mouth 2 (two) times daily. 180 tablet 3   fluticasone  (FLONASE ) 50 MCG/ACT nasal spray Place 2 sprays into both nostrils daily. (Patient not taking: Reported on 06/15/2024) 16 mL 0   losartan  (COZAAR ) 25 MG tablet Take 0.5 tablets (  12.5 mg total) by mouth daily. If systolic BP is greater than 140 45 tablet 3   rosuvastatin  (CRESTOR ) 20 MG tablet Take 1 tablet (20 mg total) by mouth daily. 90 tablet 3   No current facility-administered medications for this visit.     Past Surgical History:  Procedure Laterality Date   CORONARY STENT INTERVENTION N/A 07/17/2017   Procedure: CORONARY STENT INTERVENTION;  Surgeon: Darron Deatrice LABOR, MD;  Location: [facility] INVASIVE CV LAB;  Service: Cardiovascular;  Laterality: N/A;   CORONARY STENT INTERVENTION N/A 05/30/2021   Procedure: CORONARY STENT INTERVENTION;  Surgeon: Anner Alm ORN, MD;  Location: [facility] INVASIVE CV LAB;  Service: Cardiovascular;  Laterality: N/A;   FOOT SURGERY Left    KNEE ARTHROPLASTY Left 07/30/2018   Procedure: LEFT TOTAL KNEE ARTHROPLASTY WITH COMPUTER NAVIGATION;   Surgeon: Fidel Rogue, MD;  Location: [facility] ORS;  Service: Orthopedics;  Laterality: Left;  Needs RNFA   KNEE ARTHROPLASTY Right 11/02/2020   Procedure: COMPUTER ASSISTED TOTAL KNEE ARTHROPLASTY;  Surgeon: Fidel Rogue, MD;  Location: [facility] ORS;  Service: Orthopedics;  Laterality: Right;    LEFT HEART CATH AND CORONARY ANGIOGRAPHY N/A 07/17/2017   Procedure: LEFT HEART CATH AND CORONARY ANGIOGRAPHY;  Surgeon: Darron Deatrice LABOR, MD;  Location: [facility] INVASIVE CV LAB;  Service: Cardiovascular;  Laterality: N/A;   LEFT HEART CATH AND CORONARY ANGIOGRAPHY N/A 05/30/2021   Procedure: LEFT HEART CATH AND CORONARY ANGIOGRAPHY;  Surgeon: Anner Alm ORN, MD;  Location: [facility] INVASIVE CV LAB;  Service: Cardiovascular;  Laterality: N/A;   left wrist surgery     SKIN GRAFT  1998   TONSILLECTOMY       Allergies  Allergen Reactions   Lipitor [Atorvastatin]     Pain in neck and legs after taking medication      Family History  Problem Relation Age of Onset   Stroke Father 7   Heart attack Father 102   CAD Mother    Prostate cancer Brother    Thyroid  nodules Brother      Social History Mr. Kalmar reports that he quit smoking about 35 years ago. His smoking use included cigarettes. He has never used smokeless tobacco. Mr. Hounshell reports no history of alcohol  use.   Review of Systems CONSTITUTIONAL: No weight loss, fever, chills, weakness or fatigue.  HEENT: Eyes: No visual loss, blurred vision, double vision or yellow sclerae.No hearing loss, sneezing, congestion, runny nose or sore throat.  SKIN: No rash or itching.  CARDIOVASCULAR:  RESPIRATORY: No shortness of breath, cough or sputum.  GASTROINTESTINAL: No anorexia, nausea, vomiting or diarrhea. No abdominal pain or blood.  GENITOURINARY: No burning on urination, no polyuria NEUROLOGICAL: No headache, dizziness, syncope, paralysis, ataxia, numbness or tingling in the extremities. No change in bowel or bladder control.   MUSCULOSKELETAL: No muscle, back pain, joint pain or stiffness.  LYMPHATICS: No enlarged nodes. No history of splenectomy.  PSYCHIATRIC: No history of depression or anxiety.  ENDOCRINOLOGIC: No reports of sweating, cold or heat intolerance. No polyuria or polydipsia.  SABRA   Physical Examination Vitals:   06/15/24 0830 06/15/24 0916  BP: (!) 152/98 139/78  Pulse: 72   SpO2: 98%    Filed Weights   06/15/24 0830  Weight: 222 lb (100.7 kg)    Gen: resting comfortably, no acute distress HEENT: no scleral icterus, pupils equal round and reactive, no palptable cervical adenopathy,  CV: irreg, no m/rg, no jvd Resp: Clear to auscultation bilaterally GI: abdomen is soft, non-tender, non-distended, normal bowel  sounds, no hepatosplenomegaly MSK: extremities are warm, no edema.  Skin: warm, no rash Neuro:  no focal deficits Psych: appropriate affect   Diagnostic Studies Echocardiogram: 06/2017 Study Conclusions   - Left ventricle: The cavity size was normal. Wall thickness was    increased in a pattern of mild LVH. Systolic function was normal.    The estimated ejection fraction was in the range of 60% to 65%.    Wall motion was normal; there were no regional wall motion    abnormalities. Diastolic dysfunction, grade indeterminate.  - Aortic valve: Moderately calcified annulus. Trileaflet.    Cardiac Catheterization: 06/2017 The left ventricular systolic function is normal. LV end diastolic pressure is normal. The left ventricular ejection fraction is 55-65% by visual estimate. Ost LAD lesion, 30 %stenosed. Ost Ramus to Ramus lesion, 40 %stenosed. Prox LAD to Mid LAD lesion, 50 %stenosed. A drug eluting stent was successfully placed. Mid LAD to Dist LAD lesion, 99 %stenosed. Post intervention, there is a 0% residual stenosis.   1.  Severe one-vessel coronary artery disease with 99% stenosis in the mid to distal LAD. 2.  Normal LV systolic function and normal left ventricular  end-diastolic pressure. 3.  Successful angioplasty and drug-eluting stent placement to the mid/distal LAD.  A small septal Jerran Tappan was jailed by the stent with sluggish flow.  This resulted in chest pain and was treated with nitroglycerin  drip.   Post cath Recommendations: Dual antiplatelet therapy for at least one year.  Aggressive treatment of risk factors.  Continue nitroglycerin  drip as needed for chest pain.   08/2022 echo 1. Left ventricular ejection fraction, by estimation, is 60 to 65%. The  left ventricle has normal function. The left ventricle has no regional  wall motion abnormalities. There is severe left ventricular hypertrophy.  Left ventricular diastolic parameters   are indeterminate.   2. Right ventricular systolic function is normal. The right ventricular  size is normal. Tricuspid regurgitation signal is inadequate for assessing  PA pressure.   3. A small pericardial effusion is present. The pericardial effusion is  circumferential.   4. The mitral valve is normal in structure. No evidence of mitral valve  regurgitation. No evidence of mitral stenosis.   5. The aortic valve is tricuspid. Aortic valve regurgitation is not  visualized. No aortic stenosis is present.   6. Aortic dilatation noted. There is mild dilatation of the aortic root,  measuring 40 mm. There is mild dilatation of the ascending aorta,  measuring 41 mm.     Assessment and Plan  1.HTN - reasonable control, we have had a more lenient target given his history of orthostatic dizziness and falls   2. CAD - no recent symptoms, continue current meds   3. Afib/acquired thrombophilia - denies any symptoms, conitnue current meds including eliquis  for stroke prevention   4. HLD - at goal, continue current meds  5. Orthostatic dizziness - recent ER visit with symptoms and falls - has been chronic issue, likely some autonomic dysfunction related to his parkinsons - encouraged more aggressive  hydration, monitor symptmos. Could do compression stockings or wean bp meds further if needed        Dorn PHEBE Ross, M.D.

## 2024-06-16 DIAGNOSIS — B351 Tinea unguium: Secondary | ICD-10-CM | POA: Diagnosis not present

## 2024-06-16 DIAGNOSIS — E1142 Type 2 diabetes mellitus with diabetic polyneuropathy: Secondary | ICD-10-CM | POA: Diagnosis not present

## 2024-06-18 ENCOUNTER — Telehealth: Payer: Self-pay | Admitting: Diagnostic Neuroimaging

## 2024-06-18 NOTE — Telephone Encounter (Signed)
 Patient's sister, Stephen Graves (on [place]) said have increased carbidopa -levodopa  (SINEMET  IR) 25-100 MG tablet to 1 tablet 5 times a day since last Thursday, 06/11/24. Patient frequently freezes when walking. Asking if can increase to 2 tablets 3 times a day

## 2024-06-19 MED ORDER — CARBIDOPA-LEVODOPA 25-100 MG PO TABS: 2.0000 | ORAL_TABLET | Freq: Three times a day (TID) | ORAL | 4 refills | Status: AC

## 2024-06-19 NOTE — Telephone Encounter (Signed)
 Meds ordered this encounter  Medications   carbidopa -levodopa  (SINEMET  IR) 25-100 MG tablet    Sig: Take 2 tablets by mouth 3 (three) times daily.    Dispense:  540 tablet    Refill:  4   EDUARD FABIENE HANLON, MD 06/19/2024, 9:44 AM Certified in Neurology, Neurophysiology and Neuroimaging  [facility] [address], [address] [place], [place] [address] [phone]

## 2024-06-19 NOTE — Telephone Encounter (Signed)
 Pt's sister called wanting to know when the nurse will be calling her back with information on increasing the dosage on the pt's medication. Please advise.

## 2024-06-19 NOTE — Telephone Encounter (Signed)
 Spoke w/Pt sister (on [place]) regarding medication change request. Made sister aware Dr. Margaret agreed to CD/LD IR 25-100 mg 2 tabs TID and had also sent a [facility] msg. She stated they no longer use [facility]. She then asked if 2 tabs TID is better than 1 tab 4 or 5 times/day. Discussed that Dr. Margaret had noted in Pt last appt that in the future med could be increased to 2 tabs TID and with the 2 tabs his symptoms can be better controlled. She then stated there was a change to 1 tab QID back in July. Acknowledged that was correct but Dr. Margaret wants the Pt on 2 tabs TID for now. In the future if this is not effective, Dr. Margaret can make adjustments. Sister voiced understanding. Informed her a new script had been sent to Pt pharmacy with new dosing instructions. Discussed the times for dosing. Take 2 tabs at 8 AM, 4 PM, and 10 PM so he will not have as long from the evening dose to the morning dose. Sister voiced understanding and thanks for the call back.

## 2024-06-22 DIAGNOSIS — H04123 Dry eye syndrome of bilateral lacrimal glands: Secondary | ICD-10-CM | POA: Diagnosis not present

## 2024-06-22 DIAGNOSIS — Z961 Presence of intraocular lens: Secondary | ICD-10-CM | POA: Diagnosis not present

## 2024-06-22 DIAGNOSIS — H47213 Primary optic atrophy, bilateral: Secondary | ICD-10-CM | POA: Diagnosis not present

## 2024-06-22 DIAGNOSIS — H43393 Other vitreous opacities, bilateral: Secondary | ICD-10-CM | POA: Diagnosis not present

## 2024-08-03 ENCOUNTER — Ambulatory Visit (INDEPENDENT_AMBULATORY_CARE_PROVIDER_SITE_OTHER): Admitting: Physician Assistant

## 2024-08-03 ENCOUNTER — Encounter: Payer: Self-pay | Admitting: Physician Assistant

## 2024-08-03 VITALS — BP 115/71 | HR 95 | Temp 97.3°F | Wt 224.0 lb

## 2024-08-03 DIAGNOSIS — F419 Anxiety disorder, unspecified: Secondary | ICD-10-CM | POA: Insufficient documentation

## 2024-08-03 DIAGNOSIS — K5901 Slow transit constipation: Secondary | ICD-10-CM | POA: Diagnosis not present

## 2024-08-03 DIAGNOSIS — I4891 Unspecified atrial fibrillation: Secondary | ICD-10-CM | POA: Insufficient documentation

## 2024-08-03 DIAGNOSIS — N189 Chronic kidney disease, unspecified: Secondary | ICD-10-CM | POA: Insufficient documentation

## 2024-08-03 DIAGNOSIS — Z7984 Long term (current) use of oral hypoglycemic drugs: Secondary | ICD-10-CM | POA: Diagnosis not present

## 2024-08-03 DIAGNOSIS — G20A1 Parkinson's disease without dyskinesia, without mention of fluctuations: Secondary | ICD-10-CM | POA: Insufficient documentation

## 2024-08-03 DIAGNOSIS — F329 Major depressive disorder, single episode, unspecified: Secondary | ICD-10-CM | POA: Insufficient documentation

## 2024-08-03 DIAGNOSIS — E119 Type 2 diabetes mellitus without complications: Secondary | ICD-10-CM | POA: Diagnosis not present

## 2024-08-03 DIAGNOSIS — Z7689 Persons encountering health services in other specified circumstances: Secondary | ICD-10-CM

## 2024-08-03 DIAGNOSIS — F3341 Major depressive disorder, recurrent, in partial remission: Secondary | ICD-10-CM | POA: Diagnosis not present

## 2024-08-03 MED ORDER — METFORMIN HCL ER 500 MG PO TB24: 500.0000 mg | ORAL_TABLET | Freq: Every day | ORAL | 3 refills | Status: DC

## 2024-08-03 MED ORDER — CITALOPRAM HYDROBROMIDE 40 MG PO TABS: 20.0000 mg | ORAL_TABLET | Freq: Every day | ORAL | 3 refills | Status: DC

## 2024-08-03 NOTE — Progress Notes (Signed)
 New Patient Office Visit  Subjective    Patient ID: Stephen Graves, male    DOB: September 08, 1956  Age: 68 y.o. MRN: [id number]  CC:  Chief Complaint  Patient presents with   Establish Care    HPI Stephen Graves presents to establish care  Discussed the use of AI scribe software for clinical note transcription with the patient, who gave verbal consent to proceed.  History of Present Illness Stephen Graves is a 68 year old male with Parkinson's disease who presents with constipation. He is accompanied by his sister.  He experiences bowel movements once or twice a week, with hard and large stools. He uses Miralax  daily, and recently used a fleet enema which took about an hour and a half to produce a bowel movement. Denies abdominal pain or blood in his stool.   He has Parkinson's disease and follows regularly with neurology, with a recent increase in medication. He uses a walker but sometimes manages without it at home.   His diet mainly involves eating out, but he is increasing water  intake and reducing Pepsi and salty foods. He uses Liquid IV pouches in water  daily for two months to improve hydration. He lives alone but has nearby family and friends for support.  He has a family history of colon cancer, with a sister affected. His last colonoscopy was in 2016. He takes metformin and citalopram  and is under cardiology care for CAD and Afib. He is up to date on his flu shot this year.   Outpatient Encounter Medications as of 08/03/2024  Medication Sig   apixaban  (ELIQUIS ) 5 MG TABS tablet Take 1 tablet (5 mg total) by mouth 2 (two) times daily.   carbidopa -levodopa  (SINEMET  IR) 25-100 MG tablet Take 2 tablets by mouth 3 (three) times daily.   carvedilol  (COREG ) 6.25 MG tablet Take 1 tablet (6.25 mg total) by mouth 2 (two) times daily.   Cholecalciferol (VITAMIN D3) 25 MCG (1000 UT) CAPS Take 1 capsule by mouth daily.   citalopram  (CELEXA ) 40 MG tablet Take 0.5 tablets (20  mg total) by mouth daily.   fish oil-omega-3 fatty acids  1000 MG capsule Take 1 g by mouth daily.   losartan  (COZAAR ) 25 MG tablet Take 0.5 tablets (12.5 mg total) by mouth daily. If systolic BP is greater than 140   metFORMIN (GLUCOPHAGE-XR) 500 MG 24 hr tablet Take 1 tablet (500 mg total) by mouth daily.   nitroGLYCERIN  (NITROSTAT ) 0.4 MG SL tablet Place 1 tablet (0.4 mg total) under the tongue every 5 (five) minutes as needed for chest pain.   rosuvastatin  (CRESTOR ) 20 MG tablet Take 1 tablet (20 mg total) by mouth daily.   [DISCONTINUED] citalopram  (CELEXA ) 40 MG tablet Take 0.5 tablets (20 mg total) by mouth daily.   [DISCONTINUED] fluticasone  (FLONASE ) 50 MCG/ACT nasal spray Place 2 sprays into both nostrils daily. (Patient not taking: Reported on 06/15/2024)   [DISCONTINUED] metFORMIN (GLUCOPHAGE-XR) 500 MG 24 hr tablet Take 1 tablet by mouth daily.   No facility-administered encounter medications on file as of 08/03/2024.    Past Medical History:  Diagnosis Date   Anxiety    Coronary artery disease involving native coronary 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 (HCC) 07/18/2017   a. s/p DES to mid-distal LAD in 06/2017 with small septal branch jailed by stent placement b. 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   Skull fracture [facility])     Past Surgical History:  Procedure Laterality Date   CORONARY STENT INTERVENTION N/A 07/17/2017   Procedure: CORONARY STENT INTERVENTION;  Surgeon: Darron Deatrice LABOR, MD;  Location: [facility] INVASIVE CV LAB;  Service: Cardiovascular;  Laterality: N/A;   CORONARY STENT INTERVENTION N/A 05/30/2021   Procedure: CORONARY STENT INTERVENTION;  Surgeon: Anner Alm ORN, MD;  Location: [facility] INVASIVE CV LAB;  Service: Cardiovascular;  Laterality: N/A;   FOOT SURGERY Left    KNEE ARTHROPLASTY Left 07/30/2018   Procedure: LEFT TOTAL KNEE ARTHROPLASTY WITH  COMPUTER NAVIGATION;  Surgeon: Fidel Rogue, MD;  Location: [facility] ORS;  Service: Orthopedics;  Laterality: Left;  Needs RNFA   KNEE ARTHROPLASTY Right 11/02/2020   Procedure: COMPUTER ASSISTED TOTAL KNEE ARTHROPLASTY;  Surgeon: Fidel Rogue, MD;  Location: [facility] ORS;  Service: Orthopedics;  Laterality: Right;    LEFT HEART CATH AND CORONARY ANGIOGRAPHY N/A 07/17/2017   Procedure: LEFT HEART CATH AND CORONARY ANGIOGRAPHY;  Surgeon: Darron Deatrice LABOR, MD;  Location: [facility] INVASIVE CV LAB;  Service: Cardiovascular;  Laterality: N/A;   LEFT HEART CATH AND CORONARY ANGIOGRAPHY N/A 05/30/2021   Procedure: LEFT HEART CATH AND CORONARY ANGIOGRAPHY;  Surgeon: Anner Alm ORN, MD;  Location: [facility] INVASIVE CV LAB;  Service: Cardiovascular;  Laterality: N/A;   left wrist surgery     SKIN GRAFT  1998   TONSILLECTOMY      Family History  Problem Relation Age of Onset   Stroke Father 84   Heart attack Father 43   CAD Mother    Prostate cancer Brother    Thyroid  nodules Brother     Social History   Socioeconomic History   Marital status: Divorced    Spouse name: Not on file   Number of children: 0   Years of education: Not on file   Highest education level: Not on file  Occupational History   Not on file  Tobacco Use   Smoking status: Former    Current packs/day: 0.00    Types: Cigarettes    Quit date: 09/24/1988    Years since quitting: 35.8   Smokeless tobacco: Never   Tobacco comments:    tobacco use - no  Vaping Use   Vaping status: Never Used  Substance and Sexual Activity   Alcohol  use: No   Drug use: No   Sexual activity: Not Currently    Birth control/protection: None  Other Topics Concern   Not on file  Social History Narrative   Full time; single.    Social Drivers of Corporate Investment Banker Strain: Not on file  Food Insecurity: No Food Insecurity (08/28/2022)   Hunger Vital Sign    Worried About Running Out of Food in the Last Year: Never true    Ran Out of Food  in the Last Year: Never true  Transportation Needs: No Transportation Needs (08/28/2022)   PRAPARE - Administrator, Civil Service (Medical): No    Lack of Transportation (Non-Medical): No  Physical Activity: Not on file  Stress: Not on file  Social Connections: Not on file  Intimate Partner Violence: Not At Risk (08/28/2022)   Humiliation, Afraid, Rape, and Kick questionnaire    Fear of Current or Ex-Partner: No    Emotionally Abused: No    Physically Abused: No    Sexually Abused: No    Review of Systems  Constitutional:  Negative for chills,  fever and malaise/fatigue.  Eyes:  Negative for blurred vision and double vision.  Respiratory:  Negative for cough and shortness of breath.   Cardiovascular:  Negative for chest pain and palpitations.  Gastrointestinal:  Positive for constipation.  Neurological:  Negative for dizziness and headaches.  Psychiatric/Behavioral:  The patient is not nervous/anxious.         Objective    BP 115/71   Pulse 95   Temp (!) 97.3 F (36.3 C)   Wt 224 lb (101.6 kg)   SpO2 96%   BMI 30.38 kg/m   Physical Exam Constitutional:      General: He is not in acute distress.    Appearance: Normal appearance. He is obese. He is not ill-appearing.  HENT:     Head: Normocephalic and atraumatic.     Mouth/Throat:     Mouth: Mucous membranes are moist.     Pharynx: Oropharynx is clear.  Eyes:     Extraocular Movements: Extraocular movements intact.     Conjunctiva/sclera: Conjunctivae normal.  Cardiovascular:     Rate and Rhythm: Normal rate and regular rhythm.     Heart sounds: Normal heart sounds. No murmur heard. Pulmonary:     Effort: Pulmonary effort is normal.     Breath sounds: Normal breath sounds. No wheezing, rhonchi or rales.  Musculoskeletal:     Right lower leg: No edema.     Left lower leg: No edema.  Skin:    General: Skin is warm and dry.  Neurological:     General: No focal deficit present.     Mental Status: He  is alert and oriented to person, place, and time.  Psychiatric:        Mood and Affect: Mood normal.        Behavior: Behavior normal.       Assessment & Plan:  Encounter to establish care  Slow transit constipation Assessment & Plan: Chronic constipation with infrequent bowel movements and hard stools despite Miralax  use. - Continue Miralax  daily. Consider adding Colace if no improvement. - Increase dietary fruits and vegetables. - Monitor stool consistency and adjust treatment. - Follow up if blood develops in stool or no BM for over 1 week.    Controlled type 2 diabetes mellitus without complication, without long-term current use of insulin  (HCC) Assessment & Plan: Stable.  Continue current management.  Patient is on statin. Lipid panel checked less than a year ago. Microalbumin checked less than a year ago.  Increase dietary efforts and physical activity. Routine diabetic retinopathy screening: up-to-date. Foot exam and monofilament test done.   Orders: -     metFORMIN HCl ER; Take 1 tablet (500 mg total) by mouth daily.  Dispense: 90 tablet; Refill: 3  Recurrent major depressive disorder, in partial remission Assessment & Plan: Stable. Continue Celexa  daily. Medication refilled today.   Orders: -     Citalopram  Hydrobromide; Take 0.5 tablets (20 mg total) by mouth daily.  Dispense: 45 tablet; Refill: 3   Return in about 6 months (around 01/31/2025).   Charmaine Isella Slatten, PA-C

## 2024-08-03 NOTE — Assessment & Plan Note (Signed)
 Stable. Continue Celexa  daily. Medication refilled today.

## 2024-08-03 NOTE — Assessment & Plan Note (Signed)
 Stable.  Continue current management.  Patient is on statin. Lipid panel checked less than a year ago. Microalbumin checked less than a year ago.  Increase dietary efforts and physical activity. Routine diabetic retinopathy screening: up-to-date. Foot exam and monofilament test done.

## 2024-08-03 NOTE — Assessment & Plan Note (Signed)
 Chronic constipation with infrequent bowel movements and hard stools despite Miralax  use. - Continue Miralax  daily. Consider adding Colace if no improvement. - Increase dietary fruits and vegetables. - Monitor stool consistency and adjust treatment. - Follow up if blood develops in stool or no BM for over 1 week.

## 2024-08-24 ENCOUNTER — Ambulatory Visit: Admitting: Physician Assistant

## 2024-09-07 ENCOUNTER — Telehealth: Payer: Self-pay

## 2024-09-07 NOTE — Telephone Encounter (Signed)
 Copied from CRM #[id number]. Topic: Clinical - Prescription Issue >> Sep 07, 2024  1:06 PM Sophia H wrote: Reason for CRM: Patients sister called in to advise that they picked up RX for citalopram  (CELEXA ) 40 MG tablet - directions were written incorrectly. Patient takes 1 tablet per day and instructions are reading take 1/2 tablet per day. Patient was only given 45 tablets because of this. Wanting to have RX corrected as patient had requested a 90 day supply.  Please reach out to Camie if any questions - # [phone]  [id number] - [place], [place] - [address]

## 2024-09-09 ENCOUNTER — Other Ambulatory Visit: Payer: Self-pay | Admitting: Physician Assistant

## 2024-09-09 DIAGNOSIS — F3341 Major depressive disorder, recurrent, in partial remission: Secondary | ICD-10-CM

## 2024-09-09 MED ORDER — CITALOPRAM HYDROBROMIDE 40 MG PO TABS: 40.0000 mg | ORAL_TABLET | Freq: Every day | ORAL | 1 refills | Status: AC

## 2024-10-04 ENCOUNTER — Other Ambulatory Visit: Payer: Self-pay

## 2024-10-04 ENCOUNTER — Emergency Department (HOSPITAL_COMMUNITY)

## 2024-10-04 ENCOUNTER — Encounter (HOSPITAL_COMMUNITY): Payer: Self-pay

## 2024-10-04 ENCOUNTER — Inpatient Hospital Stay (HOSPITAL_COMMUNITY)
Admission: EM | Admit: 2024-10-04 | Discharge: 2024-10-08 | DRG: 291 | Disposition: A | Discharge status: Skilled Nursing Facility | Attending: Family Medicine | Admitting: Family Medicine

## 2024-10-04 ENCOUNTER — Observation Stay (HOSPITAL_COMMUNITY)

## 2024-10-04 DIAGNOSIS — Z888 Allergy status to other drugs, medicaments and biological substances status: Secondary | ICD-10-CM

## 2024-10-04 DIAGNOSIS — I509 Heart failure, unspecified: Secondary | ICD-10-CM | POA: Diagnosis not present

## 2024-10-04 DIAGNOSIS — Z8042 Family history of malignant neoplasm of prostate: Secondary | ICD-10-CM

## 2024-10-04 DIAGNOSIS — Z1152 Encounter for screening for COVID-19: Secondary | ICD-10-CM

## 2024-10-04 DIAGNOSIS — E1122 Type 2 diabetes mellitus with diabetic chronic kidney disease: Secondary | ICD-10-CM | POA: Diagnosis present

## 2024-10-04 DIAGNOSIS — N1831 Chronic kidney disease, stage 3a: Secondary | ICD-10-CM | POA: Diagnosis present

## 2024-10-04 DIAGNOSIS — E7800 Pure hypercholesterolemia, unspecified: Secondary | ICD-10-CM | POA: Diagnosis present

## 2024-10-04 DIAGNOSIS — I3139 Other pericardial effusion (noninflammatory): Secondary | ICD-10-CM | POA: Diagnosis present

## 2024-10-04 DIAGNOSIS — I5033 Acute on chronic diastolic (congestive) heart failure: Secondary | ICD-10-CM

## 2024-10-04 DIAGNOSIS — Z87891 Personal history of nicotine dependence: Secondary | ICD-10-CM

## 2024-10-04 DIAGNOSIS — I4891 Unspecified atrial fibrillation: Secondary | ICD-10-CM | POA: Diagnosis present

## 2024-10-04 DIAGNOSIS — I4821 Permanent atrial fibrillation: Secondary | ICD-10-CM | POA: Diagnosis present

## 2024-10-04 DIAGNOSIS — E669 Obesity, unspecified: Secondary | ICD-10-CM | POA: Diagnosis present

## 2024-10-04 DIAGNOSIS — I951 Orthostatic hypotension: Secondary | ICD-10-CM | POA: Diagnosis present

## 2024-10-04 DIAGNOSIS — Z955 Presence of coronary angioplasty implant and graft: Secondary | ICD-10-CM

## 2024-10-04 DIAGNOSIS — R309 Painful micturition, unspecified: Secondary | ICD-10-CM | POA: Diagnosis present

## 2024-10-04 DIAGNOSIS — Z6830 Body mass index (BMI) 30.0-30.9, adult: Secondary | ICD-10-CM

## 2024-10-04 DIAGNOSIS — I5030 Unspecified diastolic (congestive) heart failure: Secondary | ICD-10-CM | POA: Insufficient documentation

## 2024-10-04 DIAGNOSIS — I5031 Acute diastolic (congestive) heart failure: Secondary | ICD-10-CM | POA: Diagnosis present

## 2024-10-04 DIAGNOSIS — I130 Hypertensive heart and chronic kidney disease with heart failure and stage 1 through stage 4 chronic kidney disease, or unspecified chronic kidney disease: Principal | ICD-10-CM | POA: Diagnosis present

## 2024-10-04 DIAGNOSIS — G20A1 Parkinson's disease without dyskinesia, without mention of fluctuations: Secondary | ICD-10-CM | POA: Diagnosis present

## 2024-10-04 DIAGNOSIS — E119 Type 2 diabetes mellitus without complications: Secondary | ICD-10-CM

## 2024-10-04 DIAGNOSIS — I2510 Atherosclerotic heart disease of native coronary artery without angina pectoris: Secondary | ICD-10-CM | POA: Diagnosis present

## 2024-10-04 DIAGNOSIS — E785 Hyperlipidemia, unspecified: Secondary | ICD-10-CM | POA: Diagnosis present

## 2024-10-04 DIAGNOSIS — I4811 Longstanding persistent atrial fibrillation: Secondary | ICD-10-CM

## 2024-10-04 DIAGNOSIS — I25110 Atherosclerotic heart disease of native coronary artery with unstable angina pectoris: Secondary | ICD-10-CM | POA: Diagnosis present

## 2024-10-04 DIAGNOSIS — I77810 Thoracic aortic ectasia: Secondary | ICD-10-CM | POA: Diagnosis present

## 2024-10-04 DIAGNOSIS — F419 Anxiety disorder, unspecified: Secondary | ICD-10-CM | POA: Diagnosis present

## 2024-10-04 DIAGNOSIS — N179 Acute kidney failure, unspecified: Secondary | ICD-10-CM | POA: Diagnosis present

## 2024-10-04 DIAGNOSIS — Z79899 Other long term (current) drug therapy: Secondary | ICD-10-CM

## 2024-10-04 DIAGNOSIS — I10 Essential (primary) hypertension: Secondary | ICD-10-CM | POA: Diagnosis present

## 2024-10-04 DIAGNOSIS — Z7901 Long term (current) use of anticoagulants: Secondary | ICD-10-CM

## 2024-10-04 DIAGNOSIS — I160 Hypertensive urgency: Secondary | ICD-10-CM | POA: Diagnosis present

## 2024-10-04 DIAGNOSIS — D631 Anemia in chronic kidney disease: Secondary | ICD-10-CM | POA: Diagnosis present

## 2024-10-04 DIAGNOSIS — Z96652 Presence of left artificial knee joint: Secondary | ICD-10-CM | POA: Diagnosis present

## 2024-10-04 DIAGNOSIS — Z7984 Long term (current) use of oral hypoglycemic drugs: Secondary | ICD-10-CM

## 2024-10-04 DIAGNOSIS — Z823 Family history of stroke: Secondary | ICD-10-CM

## 2024-10-04 DIAGNOSIS — K5901 Slow transit constipation: Secondary | ICD-10-CM | POA: Diagnosis present

## 2024-10-04 DIAGNOSIS — Z8249 Family history of ischemic heart disease and other diseases of the circulatory system: Secondary | ICD-10-CM

## 2024-10-04 LAB — HEPATIC FUNCTION PANEL
ALT: <5 U/L (ref 0–44)
AST: 21 U/L (ref 15–41)
Albumin: 4.4 g/dL (ref 3.5–5.0)
Alkaline Phosphatase: 71 U/L (ref 38–126)
Bilirubin, Direct: 0.5 mg/dL — ABNORMAL HIGH (ref 0.0–0.2)
Indirect Bilirubin: 0.4 mg/dL (ref 0.3–0.9)
Total Bilirubin: 0.9 mg/dL (ref 0.0–1.2)
Total Protein: 6.9 g/dL (ref 6.5–8.1)

## 2024-10-04 LAB — CBC
HCT: 37.1 % — ABNORMAL LOW (ref 39.0–52.0)
Hemoglobin: 11.1 g/dL — ABNORMAL LOW (ref 13.0–17.0)
MCH: 27.3 pg (ref 26.0–34.0)
MCHC: 29.9 g/dL — ABNORMAL LOW (ref 30.0–36.0)
MCV: 91.2 fL (ref 80.0–100.0)
Platelets: 139 K/uL — ABNORMAL LOW (ref 150–400)
RBC: 4.07 MIL/uL — ABNORMAL LOW (ref 4.22–5.81)
RDW: 16.1 % — ABNORMAL HIGH (ref 11.5–15.5)
WBC: 6.2 K/uL (ref 4.0–10.5)
nRBC: 0 % (ref 0.0–0.2)

## 2024-10-04 LAB — BASIC METABOLIC PANEL WITH GFR
Anion gap: 14 (ref 5–15)
BUN: 22 mg/dL (ref 8–23)
CO2: 21 mmol/L — ABNORMAL LOW (ref 22–32)
Calcium: 9.2 mg/dL (ref 8.9–10.3)
Chloride: 104 mmol/L (ref 98–111)
Creatinine, Ser: 0.99 mg/dL (ref 0.61–1.24)
GFR, Estimated: >60 mL/min
Glucose, Bld: 121 mg/dL — ABNORMAL HIGH (ref 70–99)
Potassium: 4.4 mmol/L (ref 3.5–5.1)
Sodium: 139 mmol/L (ref 135–145)

## 2024-10-04 LAB — URINALYSIS, ROUTINE W REFLEX MICROSCOPIC
Bacteria, UA: NONE SEEN
Bilirubin Urine: NEGATIVE
Glucose, UA: NEGATIVE mg/dL
Ketones, ur: NEGATIVE mg/dL
Leukocytes,Ua: NEGATIVE
Nitrite: NEGATIVE
Protein, ur: NEGATIVE mg/dL
Specific Gravity, Urine: 1.003 — ABNORMAL LOW (ref 1.005–1.030)
pH: 7 (ref 5.0–8.0)

## 2024-10-04 LAB — GLUCOSE, CAPILLARY
Glucose-Capillary: 155 mg/dL — ABNORMAL HIGH (ref 70–99)
Glucose-Capillary: 119 mg/dL — ABNORMAL HIGH (ref 70–99)

## 2024-10-04 LAB — RESP PANEL BY RT-PCR (RSV, FLU A&B, COVID)  RVPGX2
Influenza A by PCR: NEGATIVE
Influenza B by PCR: NEGATIVE
Resp Syncytial Virus by PCR: NEGATIVE
SARS Coronavirus 2 by RT PCR: NEGATIVE

## 2024-10-04 LAB — TROPONIN T, HIGH SENSITIVITY
Troponin T High Sensitivity: 25 ng/L — ABNORMAL HIGH (ref 0–19)
Troponin T High Sensitivity: 22 ng/L — ABNORMAL HIGH (ref 0–19)

## 2024-10-04 LAB — PRO BRAIN NATRIURETIC PEPTIDE: Pro Brain Natriuretic Peptide: 5919 pg/mL — ABNORMAL HIGH

## 2024-10-04 LAB — MRSA NEXT GEN BY PCR, NASAL: MRSA by PCR Next Gen: NOT DETECTED

## 2024-10-04 MED ORDER — LABETALOL HCL 5 MG/ML IV SOLN
10.0000 mg | Freq: Once | INTRAVENOUS | Status: AC
  Administered 2024-10-04: 10 mg via INTRAVENOUS
  Filled 2024-10-04: qty 4

## 2024-10-04 MED ORDER — APIXABAN 5 MG PO TABS
5.0000 mg | ORAL_TABLET | Freq: Two times a day (BID) | ORAL | Status: DC
  Administered 2024-10-04 – 2024-10-08 (×8): 5 mg via ORAL
  Filled 2024-10-04 (×8): qty 1

## 2024-10-04 MED ORDER — CARBIDOPA-LEVODOPA 25-100 MG PO TABS
2.0000 | ORAL_TABLET | Freq: Three times a day (TID) | ORAL | Status: DC
  Administered 2024-10-04 – 2024-10-08 (×12): 2 via ORAL
  Filled 2024-10-04 (×12): qty 2

## 2024-10-04 MED ORDER — ACETAMINOPHEN 325 MG PO TABS
650.0000 mg | ORAL_TABLET | Freq: Four times a day (QID) | ORAL | Status: DC | PRN
  Administered 2024-10-06: 650 mg via ORAL
  Filled 2024-10-04: qty 2

## 2024-10-04 MED ORDER — FUROSEMIDE 10 MG/ML IJ SOLN
40.0000 mg | Freq: Once | INTRAMUSCULAR | Status: AC
  Administered 2024-10-04: 40 mg via INTRAVENOUS
  Filled 2024-10-04: qty 4

## 2024-10-04 MED ORDER — HYDRALAZINE HCL 20 MG/ML IJ SOLN
10.0000 mg | INTRAMUSCULAR | Status: DC | PRN
  Administered 2024-10-04 – 2024-10-06 (×4): 10 mg via INTRAVENOUS
  Filled 2024-10-04 (×4): qty 1

## 2024-10-04 MED ORDER — SENNOSIDES-DOCUSATE SODIUM 8.6-50 MG PO TABS: 1.0000 | ORAL_TABLET | Freq: Every evening | ORAL | Status: DC | PRN

## 2024-10-04 MED ORDER — CARVEDILOL 3.125 MG PO TABS
6.2500 mg | ORAL_TABLET | Freq: Two times a day (BID) | ORAL | Status: DC
  Administered 2024-10-04 – 2024-10-08 (×8): 6.25 mg via ORAL
  Filled 2024-10-04 (×9): qty 2

## 2024-10-04 MED ORDER — LOSARTAN POTASSIUM 50 MG PO TABS
50.0000 mg | ORAL_TABLET | Freq: Every day | ORAL | Status: DC
  Administered 2024-10-04: 50 mg via ORAL
  Filled 2024-10-04 (×2): qty 1

## 2024-10-04 MED ORDER — ROSUVASTATIN CALCIUM 20 MG PO TABS
20.0000 mg | ORAL_TABLET | Freq: Every day | ORAL | Status: DC
  Administered 2024-10-05 – 2024-10-08 (×4): 20 mg via ORAL
  Filled 2024-10-04 (×4): qty 1

## 2024-10-04 MED ORDER — HYDRALAZINE HCL 20 MG/ML IJ SOLN
10.0000 mg | Freq: Once | INTRAMUSCULAR | Status: AC
  Administered 2024-10-04: 10 mg via INTRAVENOUS
  Filled 2024-10-04: qty 1

## 2024-10-04 MED ORDER — ACETAMINOPHEN 650 MG RE SUPP: 650.0000 mg | Freq: Four times a day (QID) | RECTAL | Status: DC | PRN

## 2024-10-04 MED ORDER — LOSARTAN POTASSIUM 25 MG PO TABS: 12.5000 mg | ORAL_TABLET | Freq: Every day | ORAL | Status: DC

## 2024-10-04 MED ORDER — FUROSEMIDE 10 MG/ML IJ SOLN
40.0000 mg | Freq: Two times a day (BID) | INTRAMUSCULAR | Status: DC
  Administered 2024-10-04 – 2024-10-05 (×3): 40 mg via INTRAVENOUS
  Filled 2024-10-04 (×3): qty 4

## 2024-10-04 MED ORDER — ALBUTEROL SULFATE (2.5 MG/3ML) 0.083% IN NEBU: 2.5000 mg | INHALATION_SOLUTION | RESPIRATORY_TRACT | Status: DC | PRN

## 2024-10-04 MED ORDER — INSULIN ASPART 100 UNIT/ML IJ SOLN
0.0000 [IU] | Freq: Three times a day (TID) | INTRAMUSCULAR | Status: DC
  Administered 2024-10-04: 3 [IU] via SUBCUTANEOUS
  Administered 2024-10-05 – 2024-10-06 (×5): 2 [IU] via SUBCUTANEOUS
  Administered 2024-10-07 (×3): 3 [IU] via SUBCUTANEOUS
  Administered 2024-10-08: 2 [IU] via SUBCUTANEOUS
  Administered 2024-10-08: 3 [IU] via SUBCUTANEOUS
  Filled 2024-10-04 (×11): qty 1

## 2024-10-04 NOTE — H&P (Signed)
 " History and Physical    Stephen Graves:[id number] DOB: November 12, 1955 DOA: 10/04/2024  PCP: Mancil Pfeiffer, PA-C   Patient coming from: Home  I have personally briefly reviewed patient's old medical records in [facility]  Chief Complaint: sob  HPI: Stephen Graves is a 69 y.o. male with medical history significant of CAD status post stent, hypertension, hyperlipidemia, Parkinson's disease, orthostatic hypotension, former smoker who presents to the ER with complaints of increasing shortness of breath for the past 2 weeks.  Patient doesn't appear to be a good historian.  Sister at the bedside providing collateral information.  Patient has been having increasing shortness of breath for the past 2 weeks that has gotten worse today.  He has been having some cough as well with some sinus drainage.  Denies fever.  Patient denies any chest pain. Reports history of coronary artery disease with stenting twice in the past, most recent 1 in 2022.  Denies history of known heart failure.  He has history of A-fib on anticoagulation. Denies diagnosis of COPD says but reports smoking for more than 20 years before quitting 35 years ago.  ED Course: Patient was hypertensive with systolic blood pressure more than 200.  He was not hypoxic but was dyspneic.  He was short of breath with ambulation.  He was given a dose of Lasix  40 mg x 1 with good urine output.  Blood work notable for significantly elevated proBNP of 5900.  Chest x-ray with mild congestive heart failure. Given a dose of IV hydralazine  which did not improve his blood pressure much.  Given IV labetalol  2.  Hospitalist service was called to evaluate the patient for admission.  Review of Systems: As per HPI otherwise all other systems were reviewed and are negative.   Past Medical History:  Diagnosis Date   Anxiety    Coronary artery disease involving native coronary 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 (HCC)  07/18/2017   a. s/p DES to mid-distal LAD in 06/2017 with small septal branch jailed by stent placement b. 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   Skull fracture [facility])     Past Surgical History:  Procedure Laterality Date   CORONARY STENT INTERVENTION N/A 07/17/2017   Procedure: CORONARY STENT INTERVENTION;  Surgeon: Darron Deatrice LABOR, MD;  Location: [facility] INVASIVE CV LAB;  Service: Cardiovascular;  Laterality: N/A;   CORONARY STENT INTERVENTION N/A 05/30/2021   Procedure: CORONARY STENT INTERVENTION;  Surgeon: Anner Alm ORN, MD;  Location: [facility] INVASIVE CV LAB;  Service: Cardiovascular;  Laterality: N/A;   FOOT SURGERY Left    KNEE ARTHROPLASTY Left 07/30/2018   Procedure: LEFT TOTAL KNEE ARTHROPLASTY WITH COMPUTER NAVIGATION;  Surgeon: Fidel Rogue, MD;  Location: [facility] ORS;  Service: Orthopedics;  Laterality: Left;  Needs RNFA   KNEE ARTHROPLASTY Right 11/02/2020   Procedure: COMPUTER ASSISTED TOTAL KNEE ARTHROPLASTY;  Surgeon: Fidel Rogue, MD;  Location: [facility] ORS;  Service: Orthopedics;  Laterality: Right;    LEFT HEART CATH AND CORONARY ANGIOGRAPHY N/A 07/17/2017   Procedure: LEFT HEART CATH AND CORONARY ANGIOGRAPHY;  Surgeon: Darron Deatrice LABOR, MD;  Location: [facility] INVASIVE CV LAB;  Service: Cardiovascular;  Laterality: N/A;   LEFT HEART CATH AND CORONARY ANGIOGRAPHY N/A 05/30/2021   Procedure: LEFT HEART CATH AND CORONARY ANGIOGRAPHY;  Surgeon: Anner Alm ORN, MD;  Location: [facility] INVASIVE CV LAB;  Service: Cardiovascular;  Laterality: N/A;   left wrist surgery     SKIN GRAFT  1998   TONSILLECTOMY       reports that he quit smoking about 36 years ago. His smoking use included cigarettes. He has never used smokeless tobacco. He reports that he does not drink alcohol  and does not use drugs.  Allergies[1]  Family History  Problem Relation Age of Onset   Stroke Father 49   Heart attack  Father 65   CAD Mother    Prostate cancer Brother    Thyroid  nodules Brother     Prior to Admission medications  Medication Sig Start Date End Date Taking? Authorizing Provider  apixaban  (ELIQUIS ) 5 MG TABS tablet Take 1 tablet (5 mg total) by mouth 2 (two) times daily. 06/15/24   Alvan Dorn FALCON, MD  carbidopa -levodopa  (SINEMET  IR) 25-100 MG tablet Take 2 tablets by mouth 3 (three) times daily. 06/19/24   Penumalli, Eduard SAUNDERS, MD  carvedilol  (COREG ) 6.25 MG tablet Take 1 tablet (6.25 mg total) by mouth 2 (two) times daily. 06/15/24   Alvan Dorn FALCON, MD  Cholecalciferol (VITAMIN D3) 25 MCG (1000 UT) CAPS Take 1 capsule by mouth daily.    [provider]  citalopram  (CELEXA ) 40 MG tablet Take 1 tablet (40 mg total) by mouth daily. 09/09/24   Grooms, Courtney, PA-C  fish oil-omega-3 fatty acids  1000 MG capsule Take 1 g by mouth daily.    [provider]  losartan  (COZAAR ) 25 MG tablet Take 0.5 tablets (12.5 mg total) by mouth daily. If systolic BP is greater than 140 06/15/24   Branch, Dorn FALCON, MD  metFORMIN  (GLUCOPHAGE -XR) 500 MG 24 hr tablet Take 1 tablet (500 mg total) by mouth daily. 08/03/24   Grooms, Courtney, PA-C  nitroGLYCERIN  (NITROSTAT ) 0.4 MG SL tablet Place 1 tablet (0.4 mg total) under the tongue every 5 (five) minutes as needed for chest pain. 10/31/23   Alvan Dorn FALCON, MD  rosuvastatin  (CRESTOR ) 20 MG tablet Take 1 tablet (20 mg total) by mouth daily. 06/15/24   Alvan Dorn FALCON, MD    Physical Exam: Vitals:   10/04/24 1345 10/04/24 1430 10/04/24 1509 10/04/24 1515  BP: (!) 207/145 (!) 220/165 (!) 206/142 (!) 198/144  Pulse: 94 (!) 105 89 83  Resp: 19 (!) 23 20 20   Temp:      TempSrc:      SpO2: 98% 98% 99% 99%  Weight:      Height:        Constitutional: NAD, calm, comfortable Vitals:   10/04/24 1345 10/04/24 1430 10/04/24 1509 10/04/24 1515  BP: (!) 207/145 (!) 220/165 (!) 206/142 (!) 198/144  Pulse: 94 (!) 105 89 83  Resp: 19 (!) 23 20  20   Temp:      TempSrc:      SpO2: 98% 98% 99% 99%  Weight:      Height:       Dental: Chronically ill looking not in any acute distress Chest: Mild crackles bilaterally, no wheezing CVs: S1, S2, no murmur, tachycardia Abdomen: Soft, nontender no organomegaly Extremities: No edema Labs on Admission: I have personally reviewed following labs and imaging studies  CBC: Recent Labs  Lab 10/04/24 1241  WBC 6.2  HGB 11.1*  HCT 37.1*  MCV 91.2  PLT 139*   Basic Metabolic Panel: Recent Labs  Lab 10/04/24 1241  NA 139  K 4.4  CL 104  CO2 21*  GLUCOSE 121*  BUN 22  CREATININE 0.99  CALCIUM  9.2   GFR: Estimated Creatinine Clearance: 85.4 mL/min (by C-G formula based on SCr of 0.99 mg/dL). Liver Function Tests: Recent Labs  Lab 10/04/24 1254  AST 21  ALT <5  ALKPHOS 71  BILITOT 0.9  PROT 6.9  ALBUMIN  4.4   No results for input(s): LIPASE, AMYLASE in the last 168 hours. No results for input(s): AMMONIA in the last 168 hours. Coagulation Profile: No results for input(s): INR, PROTIME in the last 168 hours. Cardiac Enzymes: No results for input(s): CKTOTAL, CKMB, CKMBINDEX, TROPONINI in the last 168 hours. BNP (last 3 results) Recent Labs    10/04/24 1254  PROBNP 5,919.0*   HbA1C: No results for input(s): HGBA1C in the last 72 hours. CBG: No results for input(s): GLUCAP in the last 168 hours. Lipid Profile: No results for input(s): CHOL, HDL, LDLCALC, TRIG, CHOLHDL, LDLDIRECT in the last 72 hours. Thyroid  Function Tests: No results for input(s): TSH, T4TOTAL, FREET4, T3FREE, THYROIDAB in the last 72 hours. Anemia Panel: No results for input(s): VITAMINB12, FOLATE, FERRITIN, TIBC, IRON, RETICCTPCT in the last 72 hours. Urine analysis:    Component Value Date/Time   COLORURINE YELLOW 06/10/2024 2203   APPEARANCEUR HAZY (A) 06/10/2024 2203   LABSPEC 1.023 06/10/2024 2203   PHURINE 5.0 06/10/2024 2203    GLUCOSEU NEGATIVE 06/10/2024 2203   HGBUR SMALL (A) 06/10/2024 2203   BILIRUBINUR NEGATIVE 06/10/2024 2203   KETONESUR 5 (A) 06/10/2024 2203   PROTEINUR 100 (A) 06/10/2024 2203   NITRITE NEGATIVE 06/10/2024 2203   LEUKOCYTESUR NEGATIVE 06/10/2024 2203    Radiological Exams on Admission: DG Chest 2 View Result Date: 10/04/2024 CLINICAL DATA:  Shortness of breath, congestion. EXAM: CHEST - 2 VIEW COMPARISON:  08/27/2022 and CT chest 08/27/2022. FINDINGS: Trachea is midline. Heart is enlarged. Mild interstitial prominence and indistinctness bilaterally. Probable left lower lobe subsegmental atelectasis. Small bilateral pleural effusions. Flowing anterior osteophytosis in the thoracic spine. IMPRESSION: Mild congestive heart failure. Electronically Signed   By: Newell Eke M.D.   On: 10/04/2024 13:48    EKG: Independently reviewed. A fib  Assessment/Plan   Uncontrolled hypertension New onset heart failure  -Initiated on IV Lasix  40 mg x 1 with good urine output.  Will schedule Lasix  40 mg twice daily. Obtain echocardiogram Resume home medications including carvedilol , losartan .  Uptitrate losartan . IV labetalol , IV hydralazine  as needed for blood pressure. Patient historically had issues with fluctuating blood pressures and orthostatic hypotension.  Will be cautious. Strict intake and output, daily body weight, fluid restriction 2 L/d low-salt diet Daily electrolytes and BMP checks  Parkinson's: Resume Sinemet  at home dosing  A-fib on anticoagulation: Continue carvedilol , Eliquis .  Rate is controlled  Hyperlipidemia: Continue statin  Type 2 diabetes: Sliding scale insulin , hold metformin  for now.  Check hemoglobin A1c  Urinary symptoms: Patient reports right flank discomfort with urination.  Will obtain CT to rule out stones.  Obtain UA  DVT prophylaxis: Eliquis  Full code    Severity of Illness: The appropriate patient status for this patient is OBSERVATION.  Observation status is judged to be reasonable and necessary in order to provide the required intensity of service to ensure the patient's safety. The patient's presenting symptoms, physical exam findings, and initial radiographic and laboratory data in the context of their medical condition is felt to place them at decreased risk for further clinical deterioration. Furthermore, it is anticipated that the patient will be medically stable for discharge from the hospital within 2 midnights of  admission.     Derryl Duval MD [facility]  10/04/2024, 3:37 PM        [1]  Allergies Allergen Reactions   Lipitor [Atorvastatin]     Pain in neck and legs after taking medication   "

## 2024-10-04 NOTE — ED Notes (Signed)
 Patient transported to X-ray

## 2024-10-04 NOTE — ED Notes (Signed)
 Patient transported to CT

## 2024-10-04 NOTE — ED Notes (Signed)
 Pt up to bedside to urinate. Stood with assistance; pt stated he became extremely short of breath, pt able to be directed to breathe and became calm. O2 saturation maintained 97-99% throughout this occurrence.

## 2024-10-04 NOTE — ED Provider Notes (Signed)
 " [place] EMERGENCY DEPARTMENT AT [facility] Provider Note   CSN: [id number] Arrival date & time: 10/04/24  1231     Patient presents with: Shortness of Breath   Stephen Graves is a 69 y.o. male.   Pt is a 69 yo male with pmhx significant for afib (on Eliquis ), Parkinson's disease, HTN, HLD, and CAD.  Pt has had sob for the past few days.  He denies any f/c.  No cough.  BP is high in triage.  He said he's been taking his meds.  No  Copd (sister said he had this hx in traige, but he said he does not and I don't see this in his chart).  Pt said he could not sleep last night due to the sob and that is what prompted him to come in.       Prior to Admission medications  Medication Sig Start Date End Date Taking? Authorizing Provider  apixaban  (ELIQUIS ) 5 MG TABS tablet Take 1 tablet (5 mg total) by mouth 2 (two) times daily. 06/15/24   Alvan Dorn FALCON, MD  carbidopa -levodopa  (SINEMET  IR) 25-100 MG tablet Take 2 tablets by mouth 3 (three) times daily. 06/19/24   Penumalli, Vikram R, MD  carvedilol  (COREG ) 6.25 MG tablet Take 1 tablet (6.25 mg total) by mouth 2 (two) times daily. 06/15/24   Alvan Dorn FALCON, MD  Cholecalciferol (VITAMIN D3) 25 MCG (1000 UT) CAPS Take 1 capsule by mouth daily.    [provider]  citalopram  (CELEXA ) 40 MG tablet Take 1 tablet (40 mg total) by mouth daily. 09/09/24   Grooms, Courtney, PA-C  fish oil-omega-3 fatty acids  1000 MG capsule Take 1 g by mouth daily.    [provider]  losartan  (COZAAR ) 25 MG tablet Take 0.5 tablets (12.5 mg total) by mouth daily. If systolic BP is greater than 140 06/15/24   Alvan Dorn FALCON, MD  metFORMIN  (GLUCOPHAGE -XR) 500 MG 24 hr tablet Take 1 tablet (500 mg total) by mouth daily. 08/03/24   Grooms, Courtney, PA-C  nitroGLYCERIN  (NITROSTAT ) 0.4 MG SL tablet Place 1 tablet (0.4 mg total) under the tongue every 5 (five) minutes as needed for chest pain. 10/31/23   Alvan Dorn FALCON, MD   rosuvastatin  (CRESTOR ) 20 MG tablet Take 1 tablet (20 mg total) by mouth daily. 06/15/24   Alvan Dorn FALCON, MD    Allergies: Lipitor [atorvastatin]    Review of Systems  Respiratory:  Positive for shortness of breath.   All other systems reviewed and are negative.   Updated Vital Signs BP (!) 207/145   Pulse 94   Temp 98 F (36.7 C) (Oral)   Resp 19   Ht 6' (1.829 m)   Wt 101.2 kg   SpO2 98%   BMI 30.24 kg/m   Physical Exam Vitals and nursing note reviewed.  Constitutional:      Appearance: He is well-developed. He is obese.  HENT:     Head: Normocephalic and atraumatic.     Mouth/Throat:     Mouth: Mucous membranes are moist.     Pharynx: Oropharynx is clear.  Eyes:     Extraocular Movements: Extraocular movements intact.     Pupils: Pupils are equal, round, and reactive to light.  Pulmonary:     Effort: Tachypnea present.     Breath sounds: Rhonchi present.  Abdominal:     Palpations: Abdomen is soft.  Musculoskeletal:        General: Normal range of motion.  Cervical back: Normal range of motion and neck supple.  Skin:    General: Skin is warm.     Capillary Refill: Capillary refill takes less than 2 seconds.  Neurological:     Mental Status: He is alert and oriented to person, place, and time. Mental status is at baseline.     Comments: tremors  Psychiatric:        Mood and Affect: Mood normal.        Behavior: Behavior normal.     (all labs ordered are listed, but only abnormal results are displayed) Labs Reviewed  BASIC METABOLIC PANEL WITH GFR - Abnormal; Notable for the following components:      Result Value   CO2 21 (*)    Glucose, Bld 121 (*)    All other components within normal limits  CBC - Abnormal; Notable for the following components:   RBC 4.07 (*)    Hemoglobin 11.1 (*)    HCT 37.1 (*)    MCHC 29.9 (*)    RDW 16.1 (*)    Platelets 139 (*)    All other components within normal limits  PRO BRAIN NATRIURETIC PEPTIDE -  Abnormal; Notable for the following components:   Pro Brain Natriuretic Peptide 5,919.0 (*)    All other components within normal limits  HEPATIC FUNCTION PANEL - Abnormal; Notable for the following components:   Bilirubin, Direct 0.5 (*)    All other components within normal limits  TROPONIN T, HIGH SENSITIVITY - Abnormal; Notable for the following components:   Troponin T High Sensitivity 25 (*)    All other components within normal limits  RESP PANEL BY RT-PCR (RSV, FLU A&B, COVID)  RVPGX2  HIV ANTIBODY (ROUTINE TESTING W REFLEX)  TROPONIN T, HIGH SENSITIVITY    EKG: EKG Interpretation Date/Time:  Sunday October 04 2024 12:55:57 EST Ventricular Rate:  90 PR Interval:    QRS Duration:  94 QT Interval:  407 QTC Calculation: 498 R Axis:   34  Text Interpretation: Atrial fibrillation Paired ventricular premature complexes Borderline prolonged QT interval  PVCs are new Confirmed by Dean Clarity [phone]) on 10/04/2024 1:19:23 PM  Radiology: [facility] Chest 2 View Result Date: 10/04/2024 CLINICAL DATA:  Shortness of breath, congestion. EXAM: CHEST - 2 VIEW COMPARISON:  08/27/2022 and CT chest 08/27/2022. FINDINGS: Trachea is midline. Heart is enlarged. Mild interstitial prominence and indistinctness bilaterally. Probable left lower lobe subsegmental atelectasis. Small bilateral pleural effusions. Flowing anterior osteophytosis in the thoracic spine. IMPRESSION: Mild congestive heart failure. Electronically Signed   By: Newell Eke M.D.   On: 10/04/2024 13:48     Procedures   Medications Ordered in the ED  apixaban  (ELIQUIS ) tablet 5 mg (has no administration in time range)  carbidopa -levodopa  (SINEMET  IR) 25-100 MG per tablet immediate release 2 tablet (has no administration in time range)  carvedilol  (COREG ) tablet 6.25 mg (has no administration in time range)  rosuvastatin  (CRESTOR ) tablet 20 mg (has no administration in time range)  insulin  aspart (novoLOG ) injection 0-15 Units  (has no administration in time range)  acetaminophen  (TYLENOL ) tablet 650 mg (has no administration in time range)    Or  acetaminophen  (TYLENOL ) suppository 650 mg (has no administration in time range)  senna-docusate (Senokot-S) tablet 1 tablet (has no administration in time range)  albuterol  (PROVENTIL ) (2.5 MG/3ML) 0.083% nebulizer solution 2.5 mg (has no administration in time range)  hydrALAZINE  (APRESOLINE ) injection 10 mg (has no administration in time range)  hydrALAZINE  (APRESOLINE ) injection 10 mg (10  mg Intravenous Given 10/04/24 1303)  furosemide  (LASIX ) injection 40 mg (40 mg Intravenous Given 10/04/24 1426)  labetalol  (NORMODYNE ) injection 10 mg (10 mg Intravenous Given 10/04/24 1426)                                    Medical Decision Making Amount and/or Complexity of Data Reviewed Labs: ordered. Radiology: ordered.  Risk Prescription drug management. Decision regarding hospitalization.   This patient presents to the ED for concern of sob, this involves an extensive number of treatment options, and is a complaint that carries with it a high risk of complications and morbidity.  The differential diagnosis includes pna, bronchitis, covid/flu/rsv   Co morbidities that complicate the patient evaluation  afib (on Eliquis ), Parkinson's disease, HTN, HLD, and CAD   Additional history obtained:  Additional history obtained from epic chart review External records from outside source obtained and reviewed including sister   Lab Tests:  I Ordered, and personally interpreted labs.  The pertinent results include:  cbc with hgb 11.1, plt 139 (stable); bmp; lfts nl; bnp elevated at 5919; trop 25; covid/flu/rsv neg   Imaging Studies ordered:  I ordered imaging studies including cxr  I independently visualized and interpreted imaging which showed Mild congestive heart failure.  I agree with the radiologist interpretation   Cardiac Monitoring:  The patient was  maintained on a cardiac monitor.  I personally viewed and interpreted the cardiac monitored which showed an underlying rhythm of: af   Medicines ordered and prescription drug management:  I ordered medication including hydralazine   for sx  Reevaluation of the patient after these medicines showed that the patient improved I have reviewed the patients home medicines and have made adjustments as needed   Test Considered:  ct   Critical Interventions:  lasix    Consultations Obtained:  I requested consultation with the hospitalist (Dr. Mcarthur) ,  and discussed lab and imaging findings as well as pertinent plan - he will admit   Problem List / ED Course:  Atrial fib:  chronic.  On Eliquis . CHA2DS2/VAS Stroke Risk Points  Current as of 19 minutes ago     5 >= 2 Points: High Risk  1 to 1.99 Points: Medium Risk  0 Points: Low Risk    No Change      Details    This score determines the patient's risk of having a stroke if the  patient has atrial fibrillation.       Points Metrics  1 Has Congestive Heart Failure:  Yes    Current as of 19 minutes ago  1 Has Vascular Disease:  Yes    Current as of 19 minutes ago  1 Has Hypertension:  Yes    Current as of 19 minutes ago  1 Age:  53    Current as of 19 minutes ago  1 Has Diabetes Excluding Gestational Diabetes:  Yes    Current as of 19 minutes ago  0 Had Stroke:  No  Had TIA:  No  Had Thromboembolism:  No    Current as of 19 minutes ago  0 Male:  No    Current as of 19 minutes ago          CHF:  no hx of afib.  No diuretics.  Likely from htn and afib.  Pt started on lasix .  Pt is not hypoxic, but is very sob just with sitting on  the side of the bed.  HTN:  pt given hydralazine  and labetalol .  Hx of orthostatic hypotension from Parkinson's, so may need to keep bp a little high, not as high as it is now.  Reevaluation:  After the interventions noted above, I reevaluated the patient and found that they have  :improved   Social Determinants of Health:  Lives at home   Dispostion:  After consideration of the diagnostic results and the patients response to treatment, I feel that the patent would benefit from admission.  CRITICAL CARE Performed by: Mliss Boyers   Total critical care time: 30 minutes  Critical care time was exclusive of separately billable procedures and treating other patients.  Critical care was necessary to treat or prevent imminent or life-threatening deterioration.  Critical care was time spent personally by me on the following activities: development of treatment plan with patient and/or surrogate as well as nursing, discussions with consultants, evaluation of patient's response to treatment, examination of patient, obtaining history from patient or surrogate, ordering and performing treatments and interventions, ordering and review of laboratory studies, ordering and review of radiographic studies, pulse oximetry and re-evaluation of patient's condition.        Final diagnoses:  Acute congestive heart failure, unspecified heart failure type (HCC)  Permanent atrial fibrillation (HCC)  Hypertension, unspecified type    ED Discharge Orders     None          Boyers Mliss, MD 10/04/24 1458  "

## 2024-10-04 NOTE — ED Triage Notes (Signed)
 Pt c/o SHOB, sinus congestion, weakness, no n/v/d. Per family has not been around anyone sick he is aware. Pt states hx of COPD.

## 2024-10-05 ENCOUNTER — Observation Stay (HOSPITAL_COMMUNITY)

## 2024-10-05 DIAGNOSIS — I951 Orthostatic hypotension: Secondary | ICD-10-CM | POA: Diagnosis present

## 2024-10-05 DIAGNOSIS — D631 Anemia in chronic kidney disease: Secondary | ICD-10-CM | POA: Diagnosis present

## 2024-10-05 DIAGNOSIS — R309 Painful micturition, unspecified: Secondary | ICD-10-CM | POA: Diagnosis present

## 2024-10-05 DIAGNOSIS — I4811 Longstanding persistent atrial fibrillation: Secondary | ICD-10-CM | POA: Diagnosis not present

## 2024-10-05 DIAGNOSIS — Z7901 Long term (current) use of anticoagulants: Secondary | ICD-10-CM | POA: Diagnosis not present

## 2024-10-05 DIAGNOSIS — I25110 Atherosclerotic heart disease of native coronary artery with unstable angina pectoris: Secondary | ICD-10-CM | POA: Diagnosis not present

## 2024-10-05 DIAGNOSIS — E119 Type 2 diabetes mellitus without complications: Secondary | ICD-10-CM | POA: Diagnosis not present

## 2024-10-05 DIAGNOSIS — I5031 Acute diastolic (congestive) heart failure: Secondary | ICD-10-CM | POA: Diagnosis present

## 2024-10-05 DIAGNOSIS — I509 Heart failure, unspecified: Secondary | ICD-10-CM | POA: Diagnosis not present

## 2024-10-05 DIAGNOSIS — K5901 Slow transit constipation: Secondary | ICD-10-CM | POA: Diagnosis present

## 2024-10-05 DIAGNOSIS — N179 Acute kidney failure, unspecified: Secondary | ICD-10-CM | POA: Diagnosis present

## 2024-10-05 DIAGNOSIS — I77810 Thoracic aortic ectasia: Secondary | ICD-10-CM | POA: Diagnosis present

## 2024-10-05 DIAGNOSIS — Z1152 Encounter for screening for COVID-19: Secondary | ICD-10-CM | POA: Diagnosis not present

## 2024-10-05 DIAGNOSIS — G20A1 Parkinson's disease without dyskinesia, without mention of fluctuations: Secondary | ICD-10-CM | POA: Diagnosis present

## 2024-10-05 DIAGNOSIS — I130 Hypertensive heart and chronic kidney disease with heart failure and stage 1 through stage 4 chronic kidney disease, or unspecified chronic kidney disease: Secondary | ICD-10-CM | POA: Diagnosis present

## 2024-10-05 DIAGNOSIS — I160 Hypertensive urgency: Secondary | ICD-10-CM | POA: Diagnosis present

## 2024-10-05 DIAGNOSIS — I5033 Acute on chronic diastolic (congestive) heart failure: Secondary | ICD-10-CM | POA: Diagnosis not present

## 2024-10-05 DIAGNOSIS — Z955 Presence of coronary angioplasty implant and graft: Secondary | ICD-10-CM | POA: Diagnosis not present

## 2024-10-05 DIAGNOSIS — Z7984 Long term (current) use of oral hypoglycemic drugs: Secondary | ICD-10-CM | POA: Diagnosis not present

## 2024-10-05 DIAGNOSIS — I3139 Other pericardial effusion (noninflammatory): Secondary | ICD-10-CM | POA: Diagnosis present

## 2024-10-05 DIAGNOSIS — Z8249 Family history of ischemic heart disease and other diseases of the circulatory system: Secondary | ICD-10-CM | POA: Diagnosis not present

## 2024-10-05 DIAGNOSIS — E7800 Pure hypercholesterolemia, unspecified: Secondary | ICD-10-CM | POA: Diagnosis present

## 2024-10-05 DIAGNOSIS — R0602 Shortness of breath: Secondary | ICD-10-CM | POA: Diagnosis present

## 2024-10-05 DIAGNOSIS — Z79899 Other long term (current) drug therapy: Secondary | ICD-10-CM | POA: Diagnosis not present

## 2024-10-05 DIAGNOSIS — E669 Obesity, unspecified: Secondary | ICD-10-CM | POA: Diagnosis present

## 2024-10-05 DIAGNOSIS — I2510 Atherosclerotic heart disease of native coronary artery without angina pectoris: Secondary | ICD-10-CM | POA: Diagnosis present

## 2024-10-05 DIAGNOSIS — N1831 Chronic kidney disease, stage 3a: Secondary | ICD-10-CM | POA: Diagnosis present

## 2024-10-05 DIAGNOSIS — I4821 Permanent atrial fibrillation: Secondary | ICD-10-CM | POA: Diagnosis present

## 2024-10-05 DIAGNOSIS — F419 Anxiety disorder, unspecified: Secondary | ICD-10-CM | POA: Diagnosis present

## 2024-10-05 DIAGNOSIS — Z96652 Presence of left artificial knee joint: Secondary | ICD-10-CM | POA: Diagnosis present

## 2024-10-05 DIAGNOSIS — E1122 Type 2 diabetes mellitus with diabetic chronic kidney disease: Secondary | ICD-10-CM | POA: Diagnosis present

## 2024-10-05 LAB — CBC
HCT: 35.9 % — ABNORMAL LOW (ref 39.0–52.0)
Hemoglobin: 11.1 g/dL — ABNORMAL LOW (ref 13.0–17.0)
MCH: 27.7 pg (ref 26.0–34.0)
MCHC: 30.9 g/dL (ref 30.0–36.0)
MCV: 89.5 fL (ref 80.0–100.0)
Platelets: 128 K/uL — ABNORMAL LOW (ref 150–400)
RBC: 4.01 MIL/uL — ABNORMAL LOW (ref 4.22–5.81)
RDW: 16.1 % — ABNORMAL HIGH (ref 11.5–15.5)
WBC: 7.6 K/uL (ref 4.0–10.5)
nRBC: 0 % (ref 0.0–0.2)

## 2024-10-05 LAB — GLUCOSE, CAPILLARY
Glucose-Capillary: 144 mg/dL — ABNORMAL HIGH (ref 70–99)
Glucose-Capillary: 129 mg/dL — ABNORMAL HIGH (ref 70–99)
Glucose-Capillary: 110 mg/dL — ABNORMAL HIGH (ref 70–99)
Glucose-Capillary: 132 mg/dL — ABNORMAL HIGH (ref 70–99)

## 2024-10-05 LAB — BASIC METABOLIC PANEL WITH GFR
Anion gap: 9 (ref 5–15)
BUN: 23 mg/dL (ref 8–23)
CO2: 30 mmol/L (ref 22–32)
Calcium: 9.1 mg/dL (ref 8.9–10.3)
Chloride: 102 mmol/L (ref 98–111)
Creatinine, Ser: 1.49 mg/dL — ABNORMAL HIGH (ref 0.61–1.24)
GFR, Estimated: 50 mL/min — ABNORMAL LOW
Glucose, Bld: 109 mg/dL — ABNORMAL HIGH (ref 70–99)
Potassium: 3.6 mmol/L (ref 3.5–5.1)
Sodium: 141 mmol/L (ref 135–145)

## 2024-10-05 LAB — HIV ANTIBODY (ROUTINE TESTING W REFLEX): HIV Screen 4th Generation wRfx: NONREACTIVE

## 2024-10-05 LAB — MAGNESIUM: Magnesium: 2.3 mg/dL (ref 1.7–2.4)

## 2024-10-05 MED ORDER — ORAL CARE MOUTH RINSE: 15.0000 mL | OROMUCOSAL | Status: DC | PRN

## 2024-10-05 MED ORDER — LOSARTAN POTASSIUM 25 MG PO TABS
12.5000 mg | ORAL_TABLET | Freq: Every day | ORAL | Status: DC
  Administered 2024-10-05: 12.5 mg via ORAL
  Filled 2024-10-05: qty 1
  Filled 2024-10-05: qty 0.5

## 2024-10-05 MED ORDER — POLYETHYLENE GLYCOL 3350 17 G PO PACK
17.0000 g | PACK | Freq: Two times a day (BID) | ORAL | Status: DC
  Administered 2024-10-05 – 2024-10-08 (×7): 17 g via ORAL
  Filled 2024-10-05 (×7): qty 1

## 2024-10-05 MED ORDER — CHLORHEXIDINE GLUCONATE CLOTH 2 % EX PADS
6.0000 | MEDICATED_PAD | Freq: Every day | CUTANEOUS | Status: DC
  Administered 2024-10-05 – 2024-10-06 (×2): 6 via TOPICAL

## 2024-10-05 NOTE — Plan of Care (Signed)

## 2024-10-05 NOTE — Progress Notes (Signed)
*  PRELIMINARY RESULTS* Echocardiogram 2D Echocardiogram has been performed.  Teresa Aida PARAS 10/05/2024, 4:24 PM

## 2024-10-05 NOTE — Evaluation (Signed)
 Physical Therapy Evaluation Patient Details Name: Stephen Graves MRN: [id number] DOB: 1956/04/01 Today's Date: 10/05/2024  History of Present Illness  Stephen Graves is a 69 y.o. male with medical history significant of CAD status post stent, hypertension, hyperlipidemia, Parkinson's disease, orthostatic hypotension, former smoker who presents to the ER with complaints of increasing shortness of breath for the past 2 weeks.  Patient doesn't appear to be a good historian.  Sister at the bedside providing collateral information.  Patient has been having increasing shortness of breath for the past 2 weeks that has gotten worse today.  He has been having some cough as well with some sinus drainage.  Denies fever.  Patient denies any chest pain.  Reports history of coronary artery disease with stenting twice in the past, most recent 1 in 2022.  Denies history of known heart failure.  He has history of A-fib on anticoagulation.  Denies diagnosis of COPD says but reports smoking for more than 20 years before quitting 35 years ago.    Clinical Impression  On therapist arrival, patient in bed and his sister is at bedside.  He is agreeable to therapist assessment.  Noted HOH right ear especially.  Patient performs supine to sit with bed flat with min to mod A to pull up to sitting.  Patient able to sit on the edge of the bed with feet supported.  Patient needs mod A for initial sit to stand; he tends to lean back on his heels and has to sit back down.  Had patient perform some seated lower extremity exercise and on second attempt for sit to stand needs min A and is able to find his balance with verbal cues.  Patient able to walk about 10 ft with RW and min A to navigate the RW; noted shuffling gait pattern, decreased step length and forward flexed trunk.  PT assists patient back to bed. He needs cues to safely return to sitting.  Patient needs mod A for legs and upper body to return from sitting to supine.   patient left in bed with call button in reach, sister at bedside and nursing notified of mobility status. Patient will benefit from continued skilled therapy services to address deficits and promote return to optimal function.           If plan is discharge home, recommend the following: A lot of help with walking and/or transfers;A lot of help with bathing/dressing/bathroom;Help with stairs or ramp for entrance;Assistance with cooking/housework   Can travel by private vehicle   Yes    Equipment Recommendations Rolling walker (2 wheels);Cane  Recommendations for Other Services       Functional Status Assessment Patient has had a recent decline in their functional status and demonstrates the ability to make significant improvements in function in a reasonable and predictable amount of time.     Precautions / Restrictions Precautions Precautions: Fall Recall of Precautions/Restrictions: Intact Precaution/Restrictions Comments: shuffling gait; parkinson's Restrictions Weight Bearing Restrictions Per Provider Order: No      Mobility  Bed Mobility Overal bed mobility: Needs Assistance Bed Mobility: Supine to Sit, Sit to Supine     Supine to sit: Mod assist Sit to supine: Mod assist   General bed mobility comments: needs min to mod A to scoot in bed; reposition Patient Response: Flat affect  Transfers Overall transfer level: Needs assistance Equipment used: Rolling walker (2 wheels) Transfers: Sit to/from Stand Sit to Stand: Min assist  General transfer comment: min A for sit to stand.  tends to lean backwards    Ambulation/Gait Ambulation/Gait assistance: Min assist, Contact guard assist Gait Distance (Feet): 10 Feet Assistive device: Rolling walker (2 wheels) Gait Pattern/deviations: Decreased step length - right, Decreased step length - left, Trunk flexed Gait velocity: decreased     General Gait Details: shuffling  Stairs             Wheelchair Mobility     Tilt Bed Tilt Bed Patient Response: Flat affect  Modified Rankin (Stroke Patients Only)       Balance Overall balance assessment: Needs assistance Sitting-balance support: Bilateral upper extremity supported, Feet supported Sitting balance-Leahy Scale: Good Sitting balance - Comments: good sitting balance on the edge of the bed Postural control: Posterior lean Standing balance support: Bilateral upper extremity supported, During functional activity, Reliant on assistive device for balance Standing balance-Leahy Scale: Fair Standing balance comment: fair standing balance with RW and min A                             Pertinent Vitals/Pain Pain Assessment Pain Assessment: No/denies pain    Home Living Family/patient expects to be discharged to:: Private residence Living Arrangements: Alone Available Help at Discharge: Family;Friend(s);Available PRN/intermittently Type of Home: Mobile home Home Access: Stairs to enter Entrance Stairs-Rails: Left Entrance Stairs-Number of Steps: 3   Home Layout: One level Home Equipment: Agricultural Consultant (2 wheels);Cane - single point;Grab bars - tub/shower      Prior Function Prior Level of Function : Independent/Modified Independent             Mobility Comments: household ambulator with cane; RW for longer distances; sister assists with errands and grocery shopping ADLs Comments: sister assists him with errands; friends take him out for meals     Extremity/Trunk Assessment   Upper Extremity Assessment Upper Extremity Assessment: Right hand dominant;Generalized weakness    Lower Extremity Assessment Lower Extremity Assessment: Generalized weakness (shuffling gait due to Parkinsons)    Cervical / Trunk Assessment Cervical / Trunk Assessment: Kyphotic  Communication   Communication Communication: Impaired (hard of hearing especially right ear) Factors Affecting Communication: Hearing  impaired    Cognition Arousal: Alert Behavior During Therapy: WFL for tasks assessed/performed   PT - Cognitive impairments: No apparent impairments                         Following commands: Intact       Cueing Cueing Techniques: Verbal cues     General Comments      Exercises     Assessment/Plan    PT Assessment Patient needs continued PT services  PT Problem List Decreased strength;Decreased activity tolerance;Decreased balance;Decreased mobility       PT Treatment Interventions Gait training;Functional mobility training;Therapeutic activities;Therapeutic exercise;Balance training;Neuromuscular re-education;Patient/family education    PT Goals (Current goals can be found in the Care Plan section)  Acute Rehab PT Goals Patient Stated Goal: to return home PT Goal Formulation: With patient/family Time For Goal Achievement: 10/19/24 Potential to Achieve Goals: Good    Frequency Min 3X/week     Co-evaluation               AM-PAC PT 6 Clicks Mobility  Outcome Measure Help needed turning from your back to your side while in a flat bed without using bedrails?: A Lot Help needed moving from lying on your back to sitting  on the side of a flat bed without using bedrails?: A Lot Help needed moving to and from a bed to a chair (including a wheelchair)?: A Little Help needed standing up from a chair using your arms (e.g., wheelchair or bedside chair)?: A Little Help needed to walk in hospital room?: A Little Help needed climbing 3-5 steps with a railing? : A Lot 6 Click Score: 15    End of Session Equipment Utilized During Treatment: Gait belt Activity Tolerance: Patient tolerated treatment well Patient left: in bed;with call bell/phone within reach;with bed alarm set;with family/visitor present Nurse Communication: Mobility status PT Visit Diagnosis: Unsteadiness on feet (R26.81);Other abnormalities of gait and mobility (R26.89);Muscle weakness  (generalized) (M62.81);Difficulty in walking, not elsewhere classified (R26.2)    Time: 8641-8576 PT Time Calculation (min) (ACUTE ONLY): 25 min   Charges:   PT Evaluation $PT Eval Moderate Complexity: 1 Mod   PT General Charges $$ ACUTE PT VISIT: 1 Visit         3:18 PM, 10/05/2024 Luanne Krzyzanowski Small Donyea Beverlin MPT [facility] [place] [phone] Ph:[phone]

## 2024-10-05 NOTE — Progress Notes (Incomplete)
 Heart Failure Stewardship Pharmacy Note  PCP: Grooms, [address], PA-C PCP-Cardiologist: Alvan Carrier, MD  HPI: Stephen Graves is a 69 y.o. male with CAD s/p stent, HTN, HLD, Parkinson's disease, orthostatic hypotension, former smoker, and hx of Afib on [facility] who presented with worsening SOB for the past 2 weeks. On admission, proBNP was 5919, HS-troponin was 25, direct bilirubin 0.5 otherwise LFTs normal, CO2 21, Scr 0.99, and normal electrolytes. Chest x-ray noted probable left lower lobe subsegmental atelectasis and small bilateral pleural effusions.   TTE scheduled for today.  Pertinent cardiac history: Reports history of coronary artery disease with stenting twice in the past, most recent 1 in 2022.  Denies history of known heart failure.  He has history of A-fib on anticoagulation. - LHC in 06/2017 and 05/2021.  - TTE in 08/28/2022 noted LVEF 60-65%, severe LVH, left ventricular diastolic parameters indeterminate.   Pertinent Lab Values: Creatinine, Ser  Date Value Ref Range Status  10/05/2024 1.49 (H) 0.61 - 1.24 mg/dL Final   BUN  Date Value Ref Range Status  10/05/2024 23 8 - 23 mg/dL Final  89/71/7975 23 8 - 27 mg/dL Final   Potassium  Date Value Ref Range Status  10/05/2024 3.6 3.5 - 5.1 mmol/L Final   Sodium  Date Value Ref Range Status  10/05/2024 141 135 - 145 mmol/L Final  07/22/2023 140 134 - 144 mmol/L Final   B Natriuretic Peptide  Date Value Ref Range Status  04/29/2023 46.0 0.0 - 100.0 pg/mL Final    Comment:    Performed at [facility], [address] [place], [place] [address]   Magnesium  Date Value Ref Range Status  10/05/2024 2.3 1.7 - 2.4 mg/dL Final    Comment:    Performed at [facility], [address], [place], [place] [address]   Hemoglobin A1C  Date Value Ref Range Status  05/11/2024 6.1  Final    Vital Signs: Temp:  [97.7 F (36.5 C)-98.4 F (36.9 C)] 98 F (36.7 C) (01/12 0727) Pulse Rate:  [58-156] 58 (01/12  0700) Cardiac Rhythm: Atrial fibrillation (01/12 0000) Resp:  [13-23] 18 (01/12 0700) BP: (132-220)/(82-165) 140/99 (01/12 0700) SpO2:  [96 %-100 %] 98 % (01/12 0700) Weight:  [98.7 kg (217 lb 9.5 oz)-101.4 kg (223 lb 8.7 oz)] 98.7 kg (217 lb 9.5 oz) (01/12 0430)  Intake/Output Summary (Last 24 hours) at 10/05/2024 0805 Last data filed at 10/05/2024 0615 Gross per 24 hour  Intake 580 ml  Output 5400 ml  Net -4820 ml    Current Heart Failure Medications:  Loop diuretic: furosemide  40 mg IV BID Beta-Blocker: carvedilol  6.25 mg BID ACEI/ARB/ARNI: losartan  25 mg daily MRA: SGLT2i: Other:  Prior to admission Heart Failure Medications:  Loop diuretic: none Beta-Blocker: carvedilol  6.25 mg BID ACEI/ARB/ARNI: losartan  12.5 mg daily MRA: none SGLT2i: none Other: none  Assessment: 1. Acute on chronic {Systolic/diastolic/combined systolic/diastolic:[id number]} heart failure (LVEF 60-65%) severe LVH, left ventricular diastolic parameters indeterminate in 2023, due to mixed NICM and ICM. NYHA class *** symptoms.   TTE scheduled for today.  Patient is at [facility], unable to conduct visual exam. Visit conducted via telephone. -Symptoms: -Volume: Net output: -4700 mL.  -Hemodynamics: BP significantly elevated. HR 80-90s. -SGLT2i: Consider adding outpatient or prior to discharge pending urinary symptoms. Reports right flank pain and discomfort with urination. A1c 6.1% in 04/2024, consider rechecking. -MRA: Can consider adding spironolactone 25 mg daily pending renal function. Scr increased from 0.99 on admission to 1.49 today. K 3.6.  -  ARNI:   Plan: 1) Medication changes recommended at this time:   2) Patient assistance: - Pending.  3) Education: -To be completed prior to discharge.  Calton Nash, [email or web address].FRIES PharmD Candidate

## 2024-10-05 NOTE — Progress Notes (Addendum)
 Patient admitted for CHF Exac.  Inpatient Care Manager (ICM) conducted chart review and visited with patient and Sister at bedside to complete brief admission assessment.    Patient sitting up in recliner during CM's visit, Sister confirmed patient lives alone with a Brother who lives [place] the [place], states he has large support network of friends, family, and Church family, has someone coming by almost daily to take him out for lunch.  Patient confirmed he is independent with baseline reliance on his RW, also has a cane, no longer drives but has reliable transportation thru support network.  Denied any discharge needs, however, CM reassured patient & sister the [facility] team will continue to follow along and will assist with any arranging any transition needs that may arise.    CM noted Dx: CHF, verified patient aware of low Na diet and daily weights, patient stated he tries to weigh daily and is not very good with adhering to [place] diet.  Patient endorsed compliance with Cardiology f/u with Dr Alvan and taking prescribed medications, stating his Sister fills his pill boxes weekly and knows what everything is.  CM attached Heart Failure education on AVS.  IPCM team will continue to follow along and monitor patient advancement through interdisciplinary progression rounds. If new patient transition needs arise, please enter a ICM consult to prompt IPCM team to follow up.    10/05/24 1049  TOC Brief Assessment  Insurance and Status Reviewed  Patient has primary care physician Yes  Home environment has been reviewed Home with Self, Brother nearby  Prior level of function: Independent with baseline reliance on rolling walker  Prior/Current Home Services No current home services  Social Drivers of Health Review SDOH reviewed no interventions necessary  Readmission risk has been reviewed Yes  Transition of care needs no transition of care needs at this time

## 2024-10-05 NOTE — Progress Notes (Signed)
 " PROGRESS NOTE    Stephen Graves  FMW:[id number] DOB: 20-Dec-1955 DOA: 10/04/2024 PCP: Mancil Pfeiffer, PA-C   Brief Narrative:   Stephen Graves is a 69 y.o. male with medical history significant of CAD status post stent, hypertension, hyperlipidemia, Parkinson's disease, orthostatic hypotension, former smoker who presents to the ER with complaints of increasing shortness of breath for the past 2 weeks.  Patient doesn't appear to be a good historian.  Sister at the bedside providing collateral information.  Patient has been having increasing shortness of breath for the past 2 weeks that has gotten worse today.  He has been having some cough as well with some sinus drainage.  Denies fever.  Patient denies any chest pain. Reports history of coronary artery disease with stenting twice in the past, most recent 1 in 2022.  Denies history of known heart failure.  He has history of A-fib on anticoagulation. Denies diagnosis of COPD says but reports smoking for more than 20 years before quitting 35 years ago.   ED Course: Patient was hypertensive with systolic blood pressure more than 200.  He was not hypoxic but was dyspneic.  He was short of breath with ambulation.  He was given a dose of Lasix  40 mg x 1 with good urine output.  Blood work notable for significantly elevated proBNP of 5900.  Chest x-ray with mild congestive heart failure. Given a dose of IV hydralazine  which did not improve his blood pressure much.  Given IV labetalol  2.   Hospitalist service was called to evaluate the patient for admission.  1/12;Overnight blood pressure has improved.  He has diuresed well.  Feels better.  Assessment & Plan:   Principal Problem:   Acute exacerbation of CHF (congestive heart failure) (HCC) Active Problems:   Presence of drug coated stent in LAD coronary artery   Essential hypertension   Hyperlipidemia with target LDL less than 70   Controlled type 2 diabetes mellitus without  complication, without long-term current use of insulin  (HCC)   Parkinson disease (HCC)   A-fib (HCC)   Uncontrolled hypertension Suspected new onset heart failure   -diuresed well with Lasix  40 mg twice daily.  Creatinine jumped to 1.3 today.  Will decrease Lasix  to once daily today.  Recheck labs in the AM.  Monitor intake and output. Echocardiogram is pending  Sister reported that he has been drinking plenty of water  and takes a lot of salt containing food.  Uncontrolled hypertension: Patient more than 200/149mmHg  in the ER.  Received IV hydralazine , IV labetalol . Patient historically had issues with fluctuating blood pressures and orthostatic hypotension.  Will be cautious. Blood pressure down to 100 systolic today.  Will continue with home Coreg  6.25 twice daily and losartan  12.5 mg daily. Strict intake and output, daily body weight, fluid restriction 2 L/d low-salt diet Daily electrolytes and BMP checks   Parkinson's: Resume Sinemet  at home dosing   A-fib on anticoagulation: Continue carvedilol , Eliquis .  Rate is controlled   Hyperlipidemia: Continue statin   Type 2 diabetes: Sliding scale insulin , hold metformin  for now.  Check hemoglobin A1c   Urinary symptoms: Patient reports right flank discomfort with urination.  UA is negative.  CT rules out stones.  CT is concerning for significant stool load.  Will start MiraLAX  twice daily   Generalized weakness/Parkinson's: Will have PT evaluation.  Patient lives by himself.  DVT prophylaxis: Eliquis  Full code       Severity of Illness: The appropriate patient status for this  patient is OBSERVATION. Observation status is judged to be reasonable and necessary in order to provide the required intensity of service to ensure the patient's safety. The patient's presenting symptoms, physical exam findings, and initial radiographic and laboratory data in the context of their medical condition is felt to place them at decreased risk  for further clinical deterioration. Furthermore, it is anticipated that the patient will be medically stable for discharge from the hospital within 2 midnights of admission.      Subjective:  Sitting on recliner, feels better.  Diuresed well.  Denies shortness of breath or chest pain.  Blood pressure down to 110 systolic. Objective: Vitals:   10/05/24 0500 10/05/24 0600 10/05/24 0700 10/05/24 0727  BP: 132/82 (!) 145/100 (!) 140/99   Pulse: 95 92 (!) 58   Resp: 17 17 18    Temp:    98 F (36.7 C)  TempSrc:    Oral  SpO2: 98% 99% 98%   Weight:      Height:        Intake/Output Summary (Last 24 hours) at 10/05/2024 1005 Last data filed at 10/05/2024 0900 Gross per 24 hour  Intake 700 ml  Output 5400 ml  Net -4700 ml   Filed Weights   10/04/24 1239 10/04/24 1551 10/05/24 0430  Weight: 101.2 kg 101.4 kg 98.7 kg    Examination:  General exam: Chronically ill looking not in any acute distress Chest: Mild crackles bilaterally, no wheezing CVs: S1, S2, no murmur, tachycardia Abdomen: Soft, nontender no organomegaly Extremities: No edema  Data Reviewed: I have personally reviewed following labs and imaging studies  CBC: Recent Labs  Lab 10/04/24 1241 10/05/24 0431  WBC 6.2 7.6  HGB 11.1* 11.1*  HCT 37.1* 35.9*  MCV 91.2 89.5  PLT 139* 128*   Basic Metabolic Panel: Recent Labs  Lab 10/04/24 1241 10/05/24 0431  NA 139 141  K 4.4 3.6  CL 104 102  CO2 21* 30  GLUCOSE 121* 109*  BUN 22 23  CREATININE 0.99 1.49*  CALCIUM  9.2 9.1  MG  --  2.3   GFR: Estimated Creatinine Clearance: 56.1 mL/min (A) (by C-G formula based on SCr of 1.49 mg/dL (H)). Liver Function Tests: Recent Labs  Lab 10/04/24 1254  AST 21  ALT <5  ALKPHOS 71  BILITOT 0.9  PROT 6.9  ALBUMIN  4.4   No results for input(s): LIPASE, AMYLASE in the last 168 hours. No results for input(s): AMMONIA in the last 168 hours. Coagulation Profile: No results for input(s): INR, PROTIME in  the last 168 hours. Cardiac Enzymes: No results for input(s): CKTOTAL, CKMB, CKMBINDEX, TROPONINI in the last 168 hours. BNP (last 3 results) Recent Labs    10/04/24 1254  PROBNP 5,919.0*   HbA1C: No results for input(s): HGBA1C in the last 72 hours. CBG: Recent Labs  Lab 10/04/24 1634 10/04/24 2124 10/05/24 0723  GLUCAP 155* 119* 144*   Lipid Profile: No results for input(s): CHOL, HDL, LDLCALC, TRIG, CHOLHDL, LDLDIRECT in the last 72 hours. Thyroid  Function Tests: No results for input(s): TSH, T4TOTAL, FREET4, T3FREE, THYROIDAB in the last 72 hours. Anemia Panel: No results for input(s): VITAMINB12, FOLATE, FERRITIN, TIBC, IRON, RETICCTPCT in the last 72 hours. Sepsis Labs: No results for input(s): PROCALCITON, LATICACIDVEN in the last 168 hours.  Recent Results (from the past 240 hours)  Resp panel by RT-PCR (RSV, Flu A&B, Covid) Anterior Nasal Swab     Status: None   Collection Time: 10/04/24 12:54 PM   Specimen:  Anterior Nasal Swab  Result Value Ref Range Status   SARS Coronavirus 2 by RT PCR NEGATIVE NEGATIVE Final    Comment: (NOTE) SARS-CoV-2 target nucleic acids are NOT DETECTED.  The SARS-CoV-2 RNA is generally detectable in upper respiratory specimens during the acute phase of infection. The lowest concentration of SARS-CoV-2 viral copies this assay can detect is 138 copies/mL. A negative result does not preclude SARS-Cov-2 infection and should not be used as the sole basis for treatment or other patient management decisions. A negative result may occur with  improper specimen collection/handling, submission of specimen other than nasopharyngeal swab, presence of viral mutation(s) within the areas targeted by this assay, and inadequate number of viral copies(<138 copies/mL). A negative result must be combined with clinical observations, patient history, and epidemiological information. The expected result is  Negative.  Fact Sheet for Patients:  bloggercourse.com  Fact Sheet for Healthcare Providers:  seriousbroker.it  This test is no t yet approved or cleared by the [place]  FDA and  has been authorized for detection and/or diagnosis of SARS-CoV-2 by FDA under an Emergency Use Authorization (EUA). This EUA will remain  in effect (meaning this test can be used) for the duration of the COVID-19 declaration under Section 564(b)(1) of the Act, 21 U.S.C.section 360bbb-3(b)(1), unless the authorization is terminated  or revoked sooner.       Influenza A by PCR NEGATIVE NEGATIVE Final   Influenza B by PCR NEGATIVE NEGATIVE Final    Comment: (NOTE) The Xpert Xpress SARS-CoV-2/FLU/RSV plus assay is intended as an aid in the diagnosis of influenza from Nasopharyngeal swab specimens and should not be used as a sole basis for treatment. Nasal washings and aspirates are unacceptable for Xpert Xpress SARS-CoV-2/FLU/RSV testing.  Fact Sheet for Patients: bloggercourse.com  Fact Sheet for Healthcare Providers: seriousbroker.it  This test is not yet approved or cleared by the [place]  FDA and has been authorized for detection and/or diagnosis of SARS-CoV-2 by FDA under an Emergency Use Authorization (EUA). This EUA will remain in effect (meaning this test can be used) for the duration of the COVID-19 declaration under Section 564(b)(1) of the Act, 21 U.S.C. section 360bbb-3(b)(1), unless the authorization is terminated or revoked.     Resp Syncytial Virus by PCR NEGATIVE NEGATIVE Final    Comment: (NOTE) Fact Sheet for Patients: bloggercourse.com  Fact Sheet for Healthcare Providers: seriousbroker.it  This test is not yet approved or cleared by the [place]  FDA and has been authorized for detection and/or diagnosis of  SARS-CoV-2 by FDA under an Emergency Use Authorization (EUA). This EUA will remain in effect (meaning this test can be used) for the duration of the COVID-19 declaration under Section 564(b)(1) of the Act, 21 U.S.C. section 360bbb-3(b)(1), unless the authorization is terminated or revoked.  Performed at [facility], [address] [place], [place] [address]   MRSA Next Gen by PCR, Nasal     Status: None   Collection Time: 10/04/24  1:56 PM   Specimen: Nasal Mucosa; Nasal Swab  Result Value Ref Range Status   MRSA by PCR Next Gen NOT DETECTED NOT DETECTED Final    Comment: (NOTE) The GeneXpert MRSA Assay (FDA approved for NASAL specimens only), is one component of a comprehensive MRSA colonization surveillance program. It is not intended to diagnose MRSA infection nor to guide or monitor treatment for MRSA infections. Test performance is not FDA approved in patients less than 10 years old. Performed at [facility], [address], [place],  [place] [address]          Radiology Studies: CT RENAL STONE STUDY Result Date: 10/04/2024 CLINICAL DATA:  Abdominal and flank pain EXAM: CT ABDOMEN AND PELVIS WITHOUT CONTRAST TECHNIQUE: Multidetector CT imaging of the abdomen and pelvis was performed following the standard protocol without IV contrast. RADIATION DOSE REDUCTION: This exam was performed according to the departmental dose-optimization program which includes automated exposure control, adjustment of the mA and/or kV according to patient size and/or use of iterative reconstruction technique. COMPARISON:  08/27/2022 FINDINGS: Lower chest: Mild cardiomegaly with stable small pericardial effusion. There are small free-flowing bilateral pleural effusions. Hepatobiliary: Unremarkable unenhanced appearance of the liver and gallbladder. No biliary duct dilation. Pancreas: Unremarkable unenhanced appearance. Spleen: Unremarkable unenhanced appearance. Adrenals/Urinary Tract: The adrenals are  unremarkable. No urinary tract calculi or obstructive uropathy within either kidney. Bladder is unremarkable. Stomach/Bowel: No bowel obstruction or ileus. Moderate stool throughout the colon which may reflect constipation. Normal appendix right lower quadrant. Scattered sigmoid diverticulosis without diverticulitis. No bowel wall thickening or inflammatory change. Vascular/Lymphatic: Aortic atherosclerosis. No enlarged abdominal or pelvic lymph nodes. Reproductive: Prostate is unremarkable. Other: No free fluid or free intraperitoneal gas. No abdominal wall hernia. Musculoskeletal: No acute or destructive bony abnormalities. Reconstructed images demonstrate no additional findings. IMPRESSION: 1. No urinary tract calculi or obstructive uropathy. 2. Moderate stool throughout the colon consistent with constipation. No bowel obstruction or ileus. 3. Mild cardiomegaly and small pericardial effusion. 4. Small free-flowing bilateral pleural effusions. 5. Sigmoid diverticulosis without diverticulitis. 6.  Aortic Atherosclerosis (ICD10-I70.0). Electronically Signed   By: Ozell Daring M.D.   On: 10/04/2024 15:49   DG Chest 2 View Result Date: 10/04/2024 CLINICAL DATA:  Shortness of breath, congestion. EXAM: CHEST - 2 VIEW COMPARISON:  08/27/2022 and CT chest 08/27/2022. FINDINGS: Trachea is midline. Heart is enlarged. Mild interstitial prominence and indistinctness bilaterally. Probable left lower lobe subsegmental atelectasis. Small bilateral pleural effusions. Flowing anterior osteophytosis in the thoracic spine. IMPRESSION: Mild congestive heart failure. Electronically Signed   By: Newell Eke M.D.   On: 10/04/2024 13:48        Scheduled Meds:  apixaban   5 mg Oral BID   carbidopa -levodopa   2 tablet Oral TID   carvedilol   6.25 mg Oral BID WC   Chlorhexidine  Gluconate Cloth  6 each Topical Daily   furosemide   40 mg Intravenous BID   insulin  aspart  0-15 Units Subcutaneous TID WC   losartan   12.5 mg  Oral Daily   polyethylene glycol  17 g Oral BID   rosuvastatin   20 mg Oral Daily   Continuous Infusions:        Derryl Duval, MD [facility] 10/05/2024, 10:05 AM   "

## 2024-10-05 NOTE — Plan of Care (Signed)
" °  Problem: Education: Goal: Knowledge of General Education information will improve Description: Including pain rating scale, medication(s)/side effects and non-pharmacologic comfort measures Outcome: Progressing   Problem: Health Behavior/Discharge Planning: Goal: Ability to manage health-related needs will improve Outcome: Progressing   Problem: Clinical Measurements: Goal: Respiratory complications will improve Outcome: Progressing Goal: Cardiovascular complication will be avoided Outcome: Not Progressing   Problem: Activity: Goal: Risk for activity intolerance will decrease Outcome: Not Progressing   "

## 2024-10-05 NOTE — Plan of Care (Signed)
" °  Problem: Acute Rehab PT Goals(only PT should resolve) Goal: Pt Will Go Supine/Side To Sit Outcome: Progressing Flowsheets (Taken 10/05/2024 1519) Pt will go Supine/Side to Sit: with minimal assist Goal: Patient Will Transfer Sit To/From Stand Outcome: Progressing Flowsheets (Taken 10/05/2024 1519) Patient will transfer sit to/from stand: with minimal assist Goal: Pt Will Transfer Bed To Chair/Chair To Bed Outcome: Progressing Flowsheets (Taken 10/05/2024 1519) Pt will Transfer Bed to Chair/Chair to Bed: with min assist Goal: Pt Will Ambulate Outcome: Progressing Flowsheets (Taken 10/05/2024 1519) Pt will Ambulate:  25 feet  with minimal assist  with rolling walker   "

## 2024-10-06 ENCOUNTER — Telehealth (HOSPITAL_COMMUNITY): Payer: Self-pay

## 2024-10-06 ENCOUNTER — Other Ambulatory Visit (HOSPITAL_COMMUNITY): Payer: Self-pay

## 2024-10-06 DIAGNOSIS — I3139 Other pericardial effusion (noninflammatory): Secondary | ICD-10-CM | POA: Diagnosis present

## 2024-10-06 DIAGNOSIS — I5030 Unspecified diastolic (congestive) heart failure: Secondary | ICD-10-CM | POA: Insufficient documentation

## 2024-10-06 DIAGNOSIS — N1831 Chronic kidney disease, stage 3a: Secondary | ICD-10-CM | POA: Diagnosis present

## 2024-10-06 LAB — MAGNESIUM: Magnesium: 2.5 mg/dL — ABNORMAL HIGH (ref 1.7–2.4)

## 2024-10-06 LAB — GLUCOSE, CAPILLARY
Glucose-Capillary: 124 mg/dL — ABNORMAL HIGH (ref 70–99)
Glucose-Capillary: 126 mg/dL — ABNORMAL HIGH (ref 70–99)
Glucose-Capillary: 127 mg/dL — ABNORMAL HIGH (ref 70–99)
Glucose-Capillary: 124 mg/dL — ABNORMAL HIGH (ref 70–99)

## 2024-10-06 LAB — BASIC METABOLIC PANEL WITH GFR
Anion gap: 9 (ref 5–15)
BUN: 28 mg/dL — ABNORMAL HIGH (ref 8–23)
CO2: 32 mmol/L (ref 22–32)
Calcium: 8.9 mg/dL (ref 8.9–10.3)
Chloride: 101 mmol/L (ref 98–111)
Creatinine, Ser: 1.63 mg/dL — ABNORMAL HIGH (ref 0.61–1.24)
GFR, Estimated: 45 mL/min — ABNORMAL LOW
Glucose, Bld: 99 mg/dL (ref 70–99)
Potassium: 3.6 mmol/L (ref 3.5–5.1)
Sodium: 142 mmol/L (ref 135–145)

## 2024-10-06 LAB — ECHOCARDIOGRAM COMPLETE
AR max vel: 2.95 cm2
AV Area VTI: 2.98 cm2
AV Area mean vel: 3.07 cm2
AV Mean grad: 5 mmHg
AV Peak grad: 9.2 mmHg
Ao pk vel: 1.52 m/s
Area-P 1/2: 3.12 cm2
Height: 72 in
S' Lateral: 3.6 cm
Weight: 3481.5 [oz_av]

## 2024-10-06 MED ORDER — LOSARTAN POTASSIUM 50 MG PO TABS: 50.0000 mg | ORAL_TABLET | Freq: Every day | ORAL | Status: DC

## 2024-10-06 MED ORDER — AMLODIPINE BESYLATE 5 MG PO TABS
5.0000 mg | ORAL_TABLET | Freq: Every day | ORAL | Status: DC
  Administered 2024-10-06 – 2024-10-08 (×3): 5 mg via ORAL
  Filled 2024-10-06 (×3): qty 1

## 2024-10-06 NOTE — Plan of Care (Signed)
   Problem: Education: Goal: Knowledge of General Education information will improve Description: Including pain rating scale, medication(s)/side effects and non-pharmacologic comfort measures Outcome: Progressing   Problem: Activity: Goal: Risk for activity intolerance will decrease Outcome: Progressing   Problem: Coping: Goal: Level of anxiety will decrease Outcome: Progressing

## 2024-10-06 NOTE — Progress Notes (Addendum)
 " PROGRESS NOTE    ISAID Graves  FMW:[id number] DOB: 02/12/1956 DOA: 10/04/2024 PCP: Mancil Pfeiffer, PA-C   Brief Narrative:   Stephen Graves is a 69 y.o. male with medical history significant of CAD status post stent, hypertension, hyperlipidemia, Parkinson's disease, orthostatic hypotension, former smoker who presents to the ER with complaints of increasing shortness of breath for the past 2 weeks.  Patient doesn't appear to be a good historian.  Sister at the bedside providing collateral information.  Patient has been having increasing shortness of breath for the past 2 weeks that has gotten worse today.  He has been having some cough as well with some sinus drainage.  Denies fever.  Patient denies any chest pain. Reports history of coronary artery disease with stenting twice in the past, most recent 1 in 2022.  Denies history of known heart failure.  He has history of A-fib on anticoagulation. Denies diagnosis of COPD says but reports smoking for more than 20 years before quitting 35 years ago.   ED Course: Patient was hypertensive with systolic blood pressure more than 200.  He was not hypoxic but was dyspneic.  He was short of breath with ambulation.  He was given a dose of Lasix  40 mg x 1 with good urine output.  Blood work notable for significantly elevated proBNP of 5900.  Chest x-ray with mild congestive heart failure. Given a dose of IV hydralazine  which did not improve his blood pressure much.  Given IV labetalol  2.   Hospitalist service was called to evaluate the patient for admission.  1/12;Overnight blood pressure has improved.  He has diuresed well.  Feels better. 1/13; diuresed well at the expense of elevated creatinine.  Assessment & Plan:   Principal Problem:   Acute exacerbation of CHF (congestive heart failure) (HCC) Active Problems:   Presence of drug coated stent in LAD coronary artery   Essential hypertension   Hyperlipidemia with target LDL less than  70   Coronary artery disease involving native coronary 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 (HCC)   Controlled type 2 diabetes mellitus without complication, without long-term current use of insulin  (HCC)   Parkinson disease (HCC)   A-fib (HCC)   Slow transit constipation   CKD stage 3a, GFR 45-59 ml/min (HCC)   Pericardial effusion   (HFpEF) heart failure with preserved ejection fraction (HCC)   Uncontrolled hypertension Suspected new onset heart failure   -diuresed well with Lasix  40 mg twice daily, creatinine unfortunately rising. - Will hold Lasix  today - Continue to monitor intake and output, fluid restriction, low-salt diet Transthoracic echocardiogram 1/12: -Normal LVEF, severe concentric LVH, severe biatrial enlargement, moderate pericardial effusion.  Mild aortic root dilatation 4.2cm Sister reported that he has been drinking plenty of water  and takes a lot of salt containing food.  Uncontrolled hypertension: History of orthostatic hypotension Blood pressure more than 200/175mmHg  in the ER.  Received IV hydralazine , IV labetalol . Patient historically had issues with fluctuating blood pressures and orthostatic hypotension.  Will be cautious. -Will continue continue Coreg  6.25 twice daily, hold losartan  due to rising creatinine.   Strict intake and output, daily body weight, fluid restriction 2 L/d low-salt diet Daily electrolytes and BMP checks  Coronary artery disease status post stent -Antiplatelets were discontinued after Eliquis  started for A-fib in 2024  Parkinson's: Resume Sinemet  at home dosing   A-fib on anticoagulation: Continue carvedilol , Eliquis .  Rate is controlled  AKI on CKD: Baseline creatinine 1-1.5.  0.99 on  admission, rose to 1.63. Will hold diuretics today, holding losartan    Hyperlipidemia: Continue statin   Type 2 diabetes: Sliding scale insulin , hold metformin  for now.  Hemoglobin A1c 6.1 on 05/11/2024  Urinary  symptoms: Patient reports right flank discomfort with urination.  UA is negative.  CT rules out stones.  CT is concerning for significant stool load.  Will start MiraLAX  twice daily   Generalized weakness/Parkinson's:  Patient lives by himself.  PT evaluation completed, or skilled nursing facility recommended.  Anemia: Hemoglobin is stable DVT prophylaxis: Eliquis  Full code       Severity of Illness: Continue inpatient  Subjective:  Sitting on recliner, feels better.  Diuresed well but creatinine rising.  Vital signs are stable.  Denies chest pain or shortness of breath.  Eager for therapies  Objective: Vitals:   10/06/24 0616 10/06/24 0659 10/06/24 0703 10/06/24 0811  BP: (!) 180/113 (!) 167/113 (!) 180/113 120/62  Pulse: 79 94  96  Resp: 19     Temp: 98.4 F (36.9 C)     TempSrc: Oral     SpO2: 100% 96%  100%  Weight:      Height:        Intake/Output Summary (Last 24 hours) at 10/06/2024 1142 Last data filed at 10/06/2024 0615 Gross per 24 hour  Intake 120 ml  Output 1300 ml  Net -1180 ml   Filed Weights   10/04/24 1551 10/05/24 0430 10/06/24 0500  Weight: 101.4 kg 98.7 kg 97.7 kg    Examination:  General exam: Chronically ill looking not in any acute distress Chest: Mild crackles bilaterally, no wheezing CVs: S1, S2, no murmur, tachycardia Abdomen: Soft, nontender no organomegaly Extremities: No edema  Data Reviewed: I have personally reviewed following labs and imaging studies  CBC: Recent Labs  Lab 10/04/24 1241 10/05/24 0431  WBC 6.2 7.6  HGB 11.1* 11.1*  HCT 37.1* 35.9*  MCV 91.2 89.5  PLT 139* 128*   Basic Metabolic Panel: Recent Labs  Lab 10/04/24 1241 10/05/24 0431 10/06/24 0404  NA 139 141 142  K 4.4 3.6 3.6  CL 104 102 101  CO2 21* 30 32  GLUCOSE 121* 109* 99  BUN 22 23 28*  CREATININE 0.99 1.49* 1.63*  CALCIUM  9.2 9.1 8.9  MG  --  2.3 2.5*   GFR: Estimated Creatinine Clearance: 51.1 mL/min (A) (by C-G formula based on SCr  of 1.63 mg/dL (H)). Liver Function Tests: Recent Labs  Lab 10/04/24 1254  AST 21  ALT <5  ALKPHOS 71  BILITOT 0.9  PROT 6.9  ALBUMIN  4.4   No results for input(s): LIPASE, AMYLASE in the last 168 hours. No results for input(s): AMMONIA in the last 168 hours. Coagulation Profile: No results for input(s): INR, PROTIME in the last 168 hours. Cardiac Enzymes: No results for input(s): CKTOTAL, CKMB, CKMBINDEX, TROPONINI in the last 168 hours. BNP (last 3 results) Recent Labs    10/04/24 1254  PROBNP 5,919.0*   HbA1C: No results for input(s): HGBA1C in the last 72 hours. CBG: Recent Labs  Lab 10/05/24 0723 10/05/24 1136 10/05/24 1657 10/05/24 2113 10/06/24 0726  GLUCAP 144* 129* 110* 132* 124*   Lipid Profile: No results for input(s): CHOL, HDL, LDLCALC, TRIG, CHOLHDL, LDLDIRECT in the last 72 hours. Thyroid  Function Tests: No results for input(s): TSH, T4TOTAL, FREET4, T3FREE, THYROIDAB in the last 72 hours. Anemia Panel: No results for input(s): VITAMINB12, FOLATE, FERRITIN, TIBC, IRON, RETICCTPCT in the last 72 hours. Sepsis Labs: No results  for input(s): PROCALCITON, LATICACIDVEN in the last 168 hours.  Recent Results (from the past 240 hours)  Resp panel by RT-PCR (RSV, Flu A&B, Covid) Anterior Nasal Swab     Status: None   Collection Time: 10/04/24 12:54 PM   Specimen: Anterior Nasal Swab  Result Value Ref Range Status   SARS Coronavirus 2 by RT PCR NEGATIVE NEGATIVE Final    Comment: (NOTE) SARS-CoV-2 target nucleic acids are NOT DETECTED.  The SARS-CoV-2 RNA is generally detectable in upper respiratory specimens during the acute phase of infection. The lowest concentration of SARS-CoV-2 viral copies this assay can detect is 138 copies/mL. A negative result does not preclude SARS-Cov-2 infection and should not be used as the sole basis for treatment or other patient management decisions. A negative  result may occur with  improper specimen collection/handling, submission of specimen other than nasopharyngeal swab, presence of viral mutation(s) within the areas targeted by this assay, and inadequate number of viral copies(<138 copies/mL). A negative result must be combined with clinical observations, patient history, and epidemiological information. The expected result is Negative.  Fact Sheet for Patients:  bloggercourse.com  Fact Sheet for Healthcare Providers:  seriousbroker.it  This test is no t yet approved or cleared by the [place]  FDA and  has been authorized for detection and/or diagnosis of SARS-CoV-2 by FDA under an Emergency Use Authorization (EUA). This EUA will remain  in effect (meaning this test can be used) for the duration of the COVID-19 declaration under Section 564(b)(1) of the Act, 21 U.S.C.section 360bbb-3(b)(1), unless the authorization is terminated  or revoked sooner.       Influenza A by PCR NEGATIVE NEGATIVE Final   Influenza B by PCR NEGATIVE NEGATIVE Final    Comment: (NOTE) The Xpert Xpress SARS-CoV-2/FLU/RSV plus assay is intended as an aid in the diagnosis of influenza from Nasopharyngeal swab specimens and should not be used as a sole basis for treatment. Nasal washings and aspirates are unacceptable for Xpert Xpress SARS-CoV-2/FLU/RSV testing.  Fact Sheet for Patients: bloggercourse.com  Fact Sheet for Healthcare Providers: seriousbroker.it  This test is not yet approved or cleared by the [place]  FDA and has been authorized for detection and/or diagnosis of SARS-CoV-2 by FDA under an Emergency Use Authorization (EUA). This EUA will remain in effect (meaning this test can be used) for the duration of the COVID-19 declaration under Section 564(b)(1) of the Act, 21 U.S.C. section 360bbb-3(b)(1), unless the authorization is  terminated or revoked.     Resp Syncytial Virus by PCR NEGATIVE NEGATIVE Final    Comment: (NOTE) Fact Sheet for Patients: bloggercourse.com  Fact Sheet for Healthcare Providers: seriousbroker.it  This test is not yet approved or cleared by the [place]  FDA and has been authorized for detection and/or diagnosis of SARS-CoV-2 by FDA under an Emergency Use Authorization (EUA). This EUA will remain in effect (meaning this test can be used) for the duration of the COVID-19 declaration under Section 564(b)(1) of the Act, 21 U.S.C. section 360bbb-3(b)(1), unless the authorization is terminated or revoked.  Performed at [facility], [address] [place], [place] [address]   MRSA Next Gen by PCR, Nasal     Status: None   Collection Time: 10/04/24  1:56 PM   Specimen: Nasal Mucosa; Nasal Swab  Result Value Ref Range Status   MRSA by PCR Next Gen NOT DETECTED NOT DETECTED Final    Comment: (NOTE) The GeneXpert MRSA Assay (FDA approved for NASAL specimens only), is one component of  a comprehensive MRSA colonization surveillance program. It is not intended to diagnose MRSA infection nor to guide or monitor treatment for MRSA infections. Test performance is not FDA approved in patients less than 78 years old. Performed at [facility], [address] [place], [place] [address]          Radiology Studies: ECHOCARDIOGRAM COMPLETE Result Date: 10/06/2024    ECHOCARDIOGRAM REPORT   Patient Name:   TRAVION Graves Date of Exam: 10/05/2024 Medical Rec #:  [id number]           Height:       72.0 in Accession #:    [id number]          Weight:       217.6 lb Date of Birth:  Jul 27, 1956           BSA:          2.208 m Patient Age:    68 years            BP:           119/87 mmHg Patient Gender: M                   HR:           85 bpm. Exam Location:  [facility] Procedure: 2D Echo (Both Spectral and Color Flow Doppler were utilized during             procedure). Indications:    Congestive Heart Failure I50.9  History:        Patient has prior history of Echocardiogram examinations, most                 recent 08/28/2022. CHF, CAD, Arrythmias:Atrial Fibrillation; Risk                 Factors:Hypertension, Diabetes and Dyslipidemia. Hx of COVID-19.  Sonographer:    Aida Pizza RCS Referring Phys: [id number] Arthur Aydelotte IMPRESSIONS  1. Left ventricular ejection fraction, by estimation, is 60 to 65%. The left ventricle has normal function. The left ventricle has no regional wall motion abnormalities. There is severe concentric left ventricular hypertrophy. Left ventricular diastolic  function could not be evaluated.  2. Right ventricular systolic function is normal. The right ventricular size is normal. Tricuspid regurgitation signal is inadequate for assessing PA pressure.  3. Left atrial size was severely dilated.  4. Right atrial size was severely dilated.  5. Moderate pericardial effusion. The pericardial effusion is posterior and lateral to the left ventricle.  6. The mitral valve is normal in structure. Mild mitral valve regurgitation. No evidence of mitral stenosis.  7. The aortic valve is tricuspid. There is mild calcification of the aortic valve. Aortic valve regurgitation is not visualized. No aortic stenosis is present. Aortic valve mean gradient measures 5.0 mmHg.  8. Aortic dilatation noted. There is mild dilatation of the aortic root, measuring 42 mm.  9. The inferior vena cava is dilated in size with >50% respiratory variability, suggesting right atrial pressure of 8 mmHg. FINDINGS  Left Ventricle: Left ventricular ejection fraction, by estimation, is 60 to 65%. The left ventricle has normal function. The left ventricle has no regional wall motion abnormalities. Strain was performed and the global longitudinal strain is indeterminate. The left ventricular internal cavity size was normal in size. There is severe concentric left ventricular  hypertrophy. Left ventricular diastolic function could not be evaluated due to atrial fibrillation. Left ventricular diastolic function could not be evaluated. Right  Ventricle: The right ventricular size is normal. No increase in right ventricular wall thickness. Right ventricular systolic function is normal. Tricuspid regurgitation signal is inadequate for assessing PA pressure. Left Atrium: Left atrial size was severely dilated. Right Atrium: Right atrial size was severely dilated. Pericardium: A moderately sized pericardial effusion is present. The pericardial effusion is posterior and lateral to the left ventricle. Mitral Valve: The mitral valve is normal in structure. Mild mitral valve regurgitation. No evidence of mitral valve stenosis. Tricuspid Valve: The tricuspid valve is normal in structure. Tricuspid valve regurgitation is mild . No evidence of tricuspid stenosis. Aortic Valve: The aortic valve is tricuspid. There is mild calcification of the aortic valve. Aortic valve regurgitation is not visualized. No aortic stenosis is present. Aortic valve mean gradient measures 5.0 mmHg. Aortic valve peak gradient measures 9.2 mmHg. Aortic valve area, by VTI measures 2.98 cm. Pulmonic Valve: The pulmonic valve was normal in structure. Pulmonic valve regurgitation is trivial. No evidence of pulmonic stenosis. Aorta: Aortic dilatation noted. There is mild dilatation of the aortic root, measuring 42 mm. Venous: The inferior vena cava is dilated in size with greater than 50% respiratory variability, suggesting right atrial pressure of 8 mmHg. IAS/Shunts: No atrial level shunt detected by color flow Doppler. Additional Comments: 3D was performed not requiring image post processing on an independent workstation and was indeterminate.  LEFT VENTRICLE PLAX 2D LVIDd:         5.70 cm LVIDs:         3.60 cm LV PW:         1.50 cm LV IVS:        1.60 cm LVOT diam:     2.20 cm LV SV:         83 LV SV Index:   38 LVOT Area:      3.80 cm  RIGHT VENTRICLE RV S prime:     10.40 cm/s TAPSE (M-mode): 1.8 cm LEFT ATRIUM            Index        RIGHT ATRIUM           Index LA diam:      4.70 cm  2.13 cm/m   RA Area:     30.00 cm LA Vol (A2C): 113.0 ml 51.17 ml/m  RA Volume:   99.00 ml  44.83 ml/m LA Vol (A4C): 122.0 ml 55.25 ml/m  AORTIC VALVE AV Area (Vmax):    2.95 cm AV Area (Vmean):   3.07 cm AV Area (VTI):     2.98 cm AV Vmax:           152.00 cm/s AV Vmean:          102.000 cm/s AV VTI:            0.279 m AV Peak Grad:      9.2 mmHg AV Mean Grad:      5.0 mmHg LVOT Vmax:         118.00 cm/s LVOT Vmean:        82.300 cm/s LVOT VTI:          0.219 m LVOT/AV VTI ratio: 0.78  AORTA Ao Root diam: 4.20 cm MITRAL VALVE MV Area (PHT): 3.12 cm     SHUNTS MV Decel Time: 243 msec     Systemic VTI:  0.22 m MV E velocity: 120.00 cm/s  Systemic Diam: 2.20 cm Vishnu Priya Mallipeddi Electronically signed by Diannah Late Mallipeddi Signature Date/Time: 10/06/2024/9:33:10 AM  Final    CT RENAL STONE STUDY Result Date: 10/04/2024 CLINICAL DATA:  Abdominal and flank pain EXAM: CT ABDOMEN AND PELVIS WITHOUT CONTRAST TECHNIQUE: Multidetector CT imaging of the abdomen and pelvis was performed following the standard protocol without IV contrast. RADIATION DOSE REDUCTION: This exam was performed according to the departmental dose-optimization program which includes automated exposure control, adjustment of the mA and/or kV according to patient size and/or use of iterative reconstruction technique. COMPARISON:  08/27/2022 FINDINGS: Lower chest: Mild cardiomegaly with stable small pericardial effusion. There are small free-flowing bilateral pleural effusions. Hepatobiliary: Unremarkable unenhanced appearance of the liver and gallbladder. No biliary duct dilation. Pancreas: Unremarkable unenhanced appearance. Spleen: Unremarkable unenhanced appearance. Adrenals/Urinary Tract: The adrenals are unremarkable. No urinary tract calculi or obstructive uropathy  within either kidney. Bladder is unremarkable. Stomach/Bowel: No bowel obstruction or ileus. Moderate stool throughout the colon which may reflect constipation. Normal appendix right lower quadrant. Scattered sigmoid diverticulosis without diverticulitis. No bowel wall thickening or inflammatory change. Vascular/Lymphatic: Aortic atherosclerosis. No enlarged abdominal or pelvic lymph nodes. Reproductive: Prostate is unremarkable. Other: No free fluid or free intraperitoneal gas. No abdominal wall hernia. Musculoskeletal: No acute or destructive bony abnormalities. Reconstructed images demonstrate no additional findings. IMPRESSION: 1. No urinary tract calculi or obstructive uropathy. 2. Moderate stool throughout the colon consistent with constipation. No bowel obstruction or ileus. 3. Mild cardiomegaly and small pericardial effusion. 4. Small free-flowing bilateral pleural effusions. 5. Sigmoid diverticulosis without diverticulitis. 6.  Aortic Atherosclerosis (ICD10-I70.0). Electronically Signed   By: Ozell Daring M.D.   On: 10/04/2024 15:49   DG Chest 2 View Result Date: 10/04/2024 CLINICAL DATA:  Shortness of breath, congestion. EXAM: CHEST - 2 VIEW COMPARISON:  08/27/2022 and CT chest 08/27/2022. FINDINGS: Trachea is midline. Heart is enlarged. Mild interstitial prominence and indistinctness bilaterally. Probable left lower lobe subsegmental atelectasis. Small bilateral pleural effusions. Flowing anterior osteophytosis in the thoracic spine. IMPRESSION: Mild congestive heart failure. Electronically Signed   By: Newell Eke M.D.   On: 10/04/2024 13:48        Scheduled Meds:  amLODipine   5 mg Oral Daily   apixaban   5 mg Oral BID   carbidopa -levodopa   2 tablet Oral TID   carvedilol   6.25 mg Oral BID WC   Chlorhexidine  Gluconate Cloth  6 each Topical Daily   insulin  aspart  0-15 Units Subcutaneous TID WC   polyethylene glycol  17 g Oral BID   rosuvastatin   20 mg Oral Daily   Continuous  Infusions:        Derryl Duval, MD [facility] 10/06/2024, 11:42 AM   "

## 2024-10-06 NOTE — Telephone Encounter (Signed)
 Pharmacy Patient Advocate Encounter  Insurance verification completed.    The patient is insured through [employer]/ [employer]. Patient has Medicare and is not eligible for a copay card, but may be able to apply for patient assistance or Medicare RX Payment Plan (Patient Must reach out to their plan, if eligible for payment plan), if available.    Ran test claim for Generic Farxiga 10mg  tablet and the current 30 day co-pay is $36.35.  Ran test claim for Jardiance 10mg  tablet and the current 30 day co-pay is $40.79  This test claim was processed through [employer]- copay amounts may vary at other pharmacies due to [employer], or as the patient moves through the different stages of their insurance plan.

## 2024-10-06 NOTE — [place] FL2 (Signed)
 " [place]  MEDICAID FL2 LEVEL OF CARE FORM     IDENTIFICATION  Patient Name: Stephen Graves Birthdate: [birth year] Sex: male Admission Date (Current Location): 10/04/2024  [facility] and [place] Number:  [name] and Address:  [facility],  [address], [place] [address]      Provider Number: [id number]  Attending Physician Name and Address:  Mcarthur Pick, MD  Relative Name and Phone Number:  Alphonse Camie Ahumada,  [phone]    Current Level of Care: Hospital Recommended Level of Care: Skilled Nursing Facility Prior Approval Number:    Date Approved/Denied:   PASRR Number: [id number]  Discharge Plan: SNF    Current Diagnoses: Patient Active Problem List   Diagnosis Date Noted   Acute exacerbation of CHF (congestive heart failure) (HCC) 10/04/2024   Parkinson disease (HCC) 08/03/2024   CKD (chronic kidney disease) 08/03/2024   MDD (major depressive disorder) 08/03/2024   Anxiety 08/03/2024   A-fib (HCC) 08/03/2024   Slow transit constipation 08/03/2024   Chronic rhinitis 08/26/2023   Sensorineural hearing loss, bilateral 08/26/2023   Controlled type 2 diabetes mellitus without complication, without long-term current use of insulin  (HCC) 09/02/2022   Coronary artery disease involving native coronary 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 (HCC) 07/18/2017   Hyperlipidemia with target LDL less than 70 07/16/2017   Presence of drug coated stent in LAD coronary artery 06/2017   Essential hypertension 02/15/2010    Orientation RESPIRATION BLADDER Height & Weight     Self, Time, Situation, Place  Normal Continent Weight: 97.7 kg Height:  6' (182.9 cm)  BEHAVIORAL SYMPTOMS/MOOD NEUROLOGICAL BOWEL NUTRITION STATUS      Continent Diet (See DC summary)  AMBULATORY STATUS COMMUNICATION OF NEEDS Skin   Extensive Assist Verbally Normal                       Personal Care Assistance Level of Assistance  Bathing,  Feeding, Dressing Bathing Assistance: Maximum assistance Feeding assistance: Limited assistance Dressing Assistance: Maximum assistance     Functional Limitations Info  Sight, Hearing, Speech Sight Info: Impaired Hearing Info: Adequate Speech Info: Adequate    SPECIAL CARE FACTORS FREQUENCY  PT (By licensed PT)     PT Frequency: 5 times a week              Contractures Contractures Info: Not present    Additional Factors Info  Code Status, Allergies Code Status Info: Full Allergies Info: Lipitor           Current Medications (10/06/2024):  This is the current hospital active medication list Current Facility-Administered Medications  Medication Dose Route Frequency Provider Last Rate Last Admin   acetaminophen  (TYLENOL ) tablet 650 mg  650 mg Oral Q6H PRN Sigdel, Santosh, MD   650 mg at 10/06/24 9045   Or   acetaminophen  (TYLENOL ) suppository 650 mg  650 mg Rectal Q6H PRN Sigdel, Santosh, MD       albuterol  (PROVENTIL ) (2.5 MG/3ML) 0.083% nebulizer solution 2.5 mg  2.5 mg Nebulization Q2H PRN Sigdel, Santosh, MD       amLODipine  (NORVASC ) tablet 5 mg  5 mg Oral Daily Sigdel, Santosh, MD   5 mg at 10/06/24 9182   apixaban  (ELIQUIS ) tablet 5 mg  5 mg Oral BID Sigdel, Santosh, MD   5 mg at 10/06/24 9188   carbidopa -levodopa  (SINEMET  IR) 25-100 MG per tablet immediate release 2 tablet  2 tablet Oral TID Sigdel, Santosh, MD  2 tablet at 10/06/24 0811   carvedilol  (COREG ) tablet 6.25 mg  6.25 mg Oral BID WC Sigdel, Santosh, MD   6.25 mg at 10/06/24 9188   Chlorhexidine  Gluconate Cloth 2 % PADS 6 each  6 each Topical Daily Mcarthur Pick, MD   6 each at 10/06/24 9187   hydrALAZINE  (APRESOLINE ) injection 10 mg  10 mg Intravenous Q4H PRN Sigdel, Santosh, MD   10 mg at 10/06/24 0703   insulin  aspart (novoLOG ) injection 0-15 Units  0-15 Units Subcutaneous TID WC Sigdel, Santosh, MD   2 Units at 10/06/24 0817   Oral care mouth rinse  15 mL Mouth Rinse PRN Sigdel, Santosh, MD        polyethylene glycol (MIRALAX  / GLYCOLAX ) packet 17 g  17 g Oral BID Sigdel, Santosh, MD   17 g at 10/06/24 9187   rosuvastatin  (CRESTOR ) tablet 20 mg  20 mg Oral Daily Sigdel, Santosh, MD   20 mg at 10/06/24 0811   senna-docusate (Senokot-S) tablet 1 tablet  1 tablet Oral QHS PRN Sigdel, Santosh, MD         Discharge Medications: Please see discharge summary for a list of discharge medications.  Relevant Imaging Results:  Relevant Lab Results:   Additional Information SS# [id number]  Sharlyne Stabs, RN     "

## 2024-10-06 NOTE — Care Management Important Message (Signed)
 Important Message  Patient Details  Name: Stephen Graves MRN: [id number] Date of Birth: 08-Sep-1956   Important Message Given:  N/A - LOS <3 / Initial given by admissions     [name] Ada 10/06/2024, 3:39 PM

## 2024-10-06 NOTE — Progress Notes (Signed)
 Physical Therapy Treatment Patient Details Name: Stephen Graves MRN: [id number] DOB: 03-23-1956 Today's Date: 10/06/2024   History of Present Illness Stephen Graves is a 69 y.o. male with medical history significant of CAD status post stent, hypertension, hyperlipidemia, Parkinson's disease, orthostatic hypotension, former smoker who presents to the ER with complaints of increasing shortness of breath for the past 2 weeks.  Patient doesn't appear to be a good historian.  Sister at the bedside providing collateral information.  Patient has been having increasing shortness of breath for the past 2 weeks that has gotten worse today.  He has been having some cough as well with some sinus drainage.  Denies fever.  Patient denies any chest pain.  Reports history of coronary artery disease with stenting twice in the past, most recent 1 in 2022.  Denies history of known heart failure.  He has history of A-fib on anticoagulation.  Denies diagnosis of COPD says but reports smoking for more than 20 years before quitting 35 years ago.    PT Comments  Pt was sitting up in chair with sister present when entered.  Agreeable to therapy; c/o pain in thenar region of Lt hand.  Pt stood with less assist and without backward lean as previous visit and ambulated further today approx 48 feet using RW.  Pt with less shuffling of feet, however still with slow mobilization and decreased activity tolerance.  Pt returned to chair and was able to complete 2 exercises prior to MD entering room.  Pt will continue to benefit from skilled therapy.      If plan is discharge home, recommend the following: A lot of help with walking and/or transfers;A lot of help with bathing/dressing/bathroom;Help with stairs or ramp for entrance;Assistance with cooking/housework   Can travel by private vehicle     Yes  Equipment Recommendations  Rolling walker (2 wheels);Cane       Precautions / Restrictions Precautions Precautions:  Fall Recall of Precautions/Restrictions: Intact Precaution/Restrictions Comments: shuffling gait; parkinson's Restrictions Weight Bearing Restrictions Per Provider Order: No     Mobility  Bed Mobility Overal bed mobility: Needs Assistance Bed Mobility: Supine to Sit, Sit to Supine     Supine to sit: Mod assist Sit to supine: Mod assist   General bed mobility comments: needs min to mod A to scoot in bed; reposition    Transfers Overall transfer level: Needs assistance Equipment used: Rolling walker (2 wheels) Transfers: Sit to/from Stand Sit to Stand: Min assist           General transfer comment: min A for sit to stand but did not lean backward today.    Ambulation/Gait Ambulation/Gait assistance: Min assist, Contact guard assist Gait Distance (Feet): 48 Feet Assistive device: Rolling walker (2 wheels) Gait Pattern/deviations: Decreased step length - right, Decreased step length - left, Trunk flexed Gait velocity: decreased     General Gait Details: less shuffling          Communication Communication Communication: Impaired (hard of hearing especially right ear) Factors Affecting Communication: Hearing impaired  Cognition Arousal: Alert Behavior During Therapy: WFL for tasks assessed/performed   PT - Cognitive impairments: No apparent impairments                         Following commands: Intact      Cueing Cueing Techniques: Verbal cues  Exercises General Exercises - Lower Extremity Long Arc Quad: AROM, Both, 10 reps, Seated Hip Flexion/Marching: AROM, Both, 10  reps, Seated        Pertinent Vitals/Pain Pain Assessment Pain Assessment: Faces Faces Pain Scale: Hurts even more Pain Location: Lt thenar region of hand Pain Descriptors / Indicators: Sharp, Shooting Pain Intervention(s): Limited activity within patient's tolerance, Other (comment), Patient requesting pain meds-RN notified, Repositioned, Monitored during session (Pt  notified nursing and made MD aware that came into room at end of session)     PT Goals (current goals can now be found in the care plan section)      Frequency    Min 3X/week       AM-PAC PT 6 Clicks Mobility   Outcome Measure  Help needed turning from your back to your side while in a flat bed without using bedrails?: A Lot Help needed moving from lying on your back to sitting on the side of a flat bed without using bedrails?: A Lot Help needed moving to and from a bed to a chair (including a wheelchair)?: A Little Help needed standing up from a chair using your arms (e.g., wheelchair or bedside chair)?: A Little Help needed to walk in hospital room?: A Little Help needed climbing 3-5 steps with a railing? : A Lot 6 Click Score: 15    End of Session Equipment Utilized During Treatment: Gait belt Activity Tolerance: Patient tolerated treatment well Patient left: with call bell/phone within reach;with bed alarm set;with family/visitor present;in chair Nurse Communication: Mobility status;Other (comment) (pain in LT thumb) PT Visit Diagnosis: Unsteadiness on feet (R26.81);Other abnormalities of gait and mobility (R26.89);Muscle weakness (generalized) (M62.81);Difficulty in walking, not elsewhere classified (R26.2)     Time: 9092-9074 PT Time Calculation (min) (ACUTE ONLY): 18 min  Charges:    $Gait Training: 8-22 mins PT General Charges $$ ACUTE PT VISIT: 1 Visit                     Greig KATHEE Fuse, PTA/CLT [facility] Ph: [phone]   Fuse Greig KATHEE 10/06/2024, 1:06 PM

## 2024-10-06 NOTE — Plan of Care (Signed)

## 2024-10-06 NOTE — Progress Notes (Addendum)
 Heart Failure Stewardship Pharmacy Note  PCP: Grooms, Ralls, PA-C PCP-Cardiologist: Alvan Carrier, MD  HPI: Stephen Graves is a 69 y.o. male with CAD s/p stent, HTN, HLD, Parkinson's disease, orthostatic hypotension, former smoker, and hx of Afib on [facility] who presented with worsening SOB for the past 2 weeks. On admission, proBNP was 5919, HS-troponin was 25, direct bilirubin 0.5 otherwise LFTs normal, CO2 21, Scr 0.99, and normal electrolytes. Chest x-ray noted mild congestive heart failure, probable left lower lobe subsegmental atelectasis and small bilateral pleural effusions.   Pertinent cardiac history: Reports history of coronary artery disease with stenting twice in the past, most recent 1 in 2022.  Denies history of known heart failure.  He has history of A-fib on anticoagulation. - LHC in 06/2017 and 05/2021.  - TTE 08/28/2022 noted LVEF 60-65%, severe LVH, left ventricular diastolic parameters indeterminate.  - TTE 10/06/23 noted LVEF 60-65%, severe concentric LVH, left ventricular diastolic parameters could not be evaluated due to atrial fibrillation, moderate pericardial effusion.   Pertinent Lab Values: Creatinine, Ser  Date Value Ref Range Status  10/05/2024 1.49 (H) 0.61 - 1.24 mg/dL Final   BUN  Date Value Ref Range Status  10/05/2024 23 8 - 23 mg/dL Final  89/71/7975 23 8 - 27 mg/dL Final   Potassium  Date Value Ref Range Status  10/05/2024 3.6 3.5 - 5.1 mmol/L Final   Sodium  Date Value Ref Range Status  10/05/2024 141 135 - 145 mmol/L Final  07/22/2023 140 134 - 144 mmol/L Final   B Natriuretic Peptide  Date Value Ref Range Status  04/29/2023 46.0 0.0 - 100.0 pg/mL Final    Comment:    Performed at [facility], [address], [place], [place] [address]   Magnesium  Date Value Ref Range Status  10/05/2024 2.3 1.7 - 2.4 mg/dL Final    Comment:    Performed at [facility], [address], [place], [place] [address]   Hemoglobin A1C  Date Value Ref  Range Status  05/11/2024 6.1  Final    Vital Signs: Temp:  [97.4 F (36.3 C)-98.6 F (37 C)] 98.5 F (36.9 C) (01/13 0043) Pulse Rate:  [44-119] 54 (01/13 0043) Cardiac Rhythm: Atrial fibrillation (01/12 1900) Resp:  [14-25] 19 (01/13 0043) BP: (111-156)/(65-100) 156/89 (01/13 0043) SpO2:  [95 %-100 %] 95 % (01/13 0043)  Intake/Output Summary (Last 24 hours) at 10/06/2024 0542 Last data filed at 10/05/2024 2115 Gross per 24 hour  Intake 240 ml  Output 1150 ml  Net -910 ml    Current Heart Failure Medications:  Loop diuretic: none Beta-Blocker: carvedilol  6.25 mg BID ACEI/ARB/ARNI: none MRA: none SGLT2i: none Other: amlodipine  5 mg daily  Prior to admission Heart Failure Medications:  Loop diuretic: none Beta-Blocker: carvedilol  6.25 mg BID ACEI/ARB/ARNI: losartan  12.5 mg daily MRA: none SGLT2i: none Other: none  Assessment: 1. Acute congestive heart failure (LVEF 60-65%), severe concentric LVH, left ventricular diastolic parameters could not be evaluated due to atrial fibrillation, moderate pericardial effusion, due to mixed NICM and ICM. NYHA class II-III symptoms.   Patient presented with worsening SOB and admitted for uncontrolled hypertension and suspected new onset heart failure. TTE yesterday with LVEF of 60-65%.    Patient is at [facility], unable to conduct visual exam. Visit conducted via telephone. -Symptoms: Reports SOB present but improved. Denies orthopnea. Reports appetite is good. Reports increased urination, dark in color. Denies abdominal and lower extremity swelling. Symptoms may be attributed to AF as well. -Volume: Net  output since admit: -5330 mL. Currently holding diuresis. Creatinine and BUN are trending up.  -Hemodynamics: BP labile, significantly elevated at times. HR labile, low 44 and high 119. -ACEI/ARB/ARNI: Agree with holding losartan  12.5 mg daily due to increasing Scr. Given patient's historically issues with fluctuating blood pressures  and orthostatic hypotension, would prefer ARB over ARNI or ACEI due to reduced impact on BP. If BP remains significantly elevated, can consider Entresto 24/26 mg BID. -SGLT2i: Consider adding Jardiance or Farxiga prior to discharge pending copay check. Reports right flank pain and discomfort with urination, however UA negative and CT rules out for stones. A1c 6.1% in 04/2024, consider rechecking. -MRA: Can consider adding spironolactone 25 mg daily pending resolution of worsening renal function. Scr 1.63, K 3.6. May offer additional diuresis.  Plan: 1) Medication changes recommended at this time: - Consider adding Darxiga 10 mg daily tomorrow - Consider potassium 40 mEq PO once to reach goal K >4.   2) Patient assistance: - Bernadine is $40.79 and Farxiga copay is $36.35.  3) Education: - Patient has been educated on current HF medications and potential additions to HF medication regimen - Patient verbalizes understanding that over the next few months, these medication doses may change and more medications may be added to optimize HF regimen - Patient has been educated on basic disease state pathophysiology and goals of therapy  Calton Nash, [email or web address].FRIES PharmD Candidate  Please do not hesitate to reach out with questions or concerns,  Jaun Bash, PharmD, CPP, BCPS, [facility] Heart Failure Pharmacist  Phone - [phone] 10/06/2024 3:36 PM

## 2024-10-07 ENCOUNTER — Other Ambulatory Visit (HOSPITAL_COMMUNITY): Payer: Self-pay

## 2024-10-07 ENCOUNTER — Telehealth (HOSPITAL_COMMUNITY): Payer: Self-pay

## 2024-10-07 DIAGNOSIS — Z955 Presence of coronary angioplasty implant and graft: Secondary | ICD-10-CM | POA: Diagnosis not present

## 2024-10-07 DIAGNOSIS — I5033 Acute on chronic diastolic (congestive) heart failure: Secondary | ICD-10-CM

## 2024-10-07 DIAGNOSIS — N1831 Chronic kidney disease, stage 3a: Secondary | ICD-10-CM | POA: Diagnosis not present

## 2024-10-07 DIAGNOSIS — I509 Heart failure, unspecified: Secondary | ICD-10-CM | POA: Diagnosis not present

## 2024-10-07 DIAGNOSIS — G20A1 Parkinson's disease without dyskinesia, without mention of fluctuations: Secondary | ICD-10-CM | POA: Diagnosis not present

## 2024-10-07 DIAGNOSIS — I4811 Longstanding persistent atrial fibrillation: Secondary | ICD-10-CM

## 2024-10-07 DIAGNOSIS — E119 Type 2 diabetes mellitus without complications: Secondary | ICD-10-CM | POA: Diagnosis not present

## 2024-10-07 DIAGNOSIS — I3139 Other pericardial effusion (noninflammatory): Secondary | ICD-10-CM | POA: Diagnosis not present

## 2024-10-07 DIAGNOSIS — I25110 Atherosclerotic heart disease of native coronary artery with unstable angina pectoris: Secondary | ICD-10-CM | POA: Diagnosis not present

## 2024-10-07 LAB — GLUCOSE, CAPILLARY
Glucose-Capillary: 190 mg/dL — ABNORMAL HIGH (ref 70–99)
Glucose-Capillary: 153 mg/dL — ABNORMAL HIGH (ref 70–99)
Glucose-Capillary: 156 mg/dL — ABNORMAL HIGH (ref 70–99)
Glucose-Capillary: 98 mg/dL (ref 70–99)

## 2024-10-07 LAB — BASIC METABOLIC PANEL WITH GFR
Anion gap: 11 (ref 5–15)
BUN: 27 mg/dL — ABNORMAL HIGH (ref 8–23)
CO2: 27 mmol/L (ref 22–32)
Calcium: 8.8 mg/dL — ABNORMAL LOW (ref 8.9–10.3)
Chloride: 102 mmol/L (ref 98–111)
Creatinine, Ser: 1.49 mg/dL — ABNORMAL HIGH (ref 0.61–1.24)
GFR, Estimated: 50 mL/min — ABNORMAL LOW
Glucose, Bld: 100 mg/dL — ABNORMAL HIGH (ref 70–99)
Potassium: 3.6 mmol/L (ref 3.5–5.1)
Sodium: 140 mmol/L (ref 135–145)

## 2024-10-07 NOTE — Plan of Care (Signed)
" °  Problem: Education: Goal: Knowledge of General Education information will improve Description: Including pain rating scale, medication(s)/side effects and non-pharmacologic comfort measures Outcome: Progressing   Problem: Health Behavior/Discharge Planning: Goal: Ability to manage health-related needs will improve Outcome: Not Met (add Reason)   Problem: Clinical Measurements: Goal: Ability to maintain clinical measurements within normal limits will improve Outcome: Progressing Goal: Respiratory complications will improve Outcome: Progressing   Problem: Activity: Goal: Risk for activity intolerance will decrease Outcome: Progressing   Problem: Nutrition: Goal: Adequate nutrition will be maintained Outcome: Adequate for Discharge   "

## 2024-10-07 NOTE — Progress Notes (Signed)
 Physical Therapy Treatment Patient Details Name: Stephen Graves MRN: [id number] DOB: 06/18/1956 Today's Date: 10/07/2024   History of Present Illness Stephen Graves is a 69 y.o. male with medical history significant of CAD status post stent, hypertension, hyperlipidemia, Parkinson's disease, orthostatic hypotension, former smoker who presents to the ER with complaints of increasing shortness of breath for the past 2 weeks.  Patient doesn't appear to be a good historian.  Sister at the bedside providing collateral information.  Patient has been having increasing shortness of breath for the past 2 weeks that has gotten worse today.  He has been having some cough as well with some sinus drainage.  Denies fever.  Patient denies any chest pain.  Reports history of coronary artery disease with stenting twice in the past, most recent 1 in 2022.  Denies history of known heart failure.  He has history of A-fib on anticoagulation.  Denies diagnosis of COPD says but reports smoking for more than 20 years before quitting 35 years ago.    PT Comments  Pt was agreeable to PT session. Received patient sitting in recliner with sister present. Patient is able to perform seated LE exercises with verbal and visual cueing. Printed HEP give to pt. Pt demonstrates some posterior leaning throughout indicating poor core strength but is able to correct with verbal and tactile cueing. Pt performs 3 STS during session, with less assist required with each one, indicating improved LE strength. Patient tolerates ambulating into hall with RW. Shuffling gait remains with most difficulty during turns. Pt returns to room and tolerates sitting in chair at EOS, chair alarm set, call button in reach and all needs met. Patient will benefit from continued skilled physical therapy acutely and in recommended venue in order to address current deficits and improve overall function.     If plan is discharge home, recommend the following:  A lot of help with walking and/or transfers;A lot of help with bathing/dressing/bathroom;Help with stairs or ramp for entrance;Assistance with cooking/housework   Can travel by private vehicle        Equipment Recommendations  Rolling walker (2 wheels);Cane    Recommendations for Other Services       Precautions / Restrictions Precautions Precautions: Fall Recall of Precautions/Restrictions: Intact Restrictions Weight Bearing Restrictions Per Provider Order: No     Mobility  Bed Mobility               General bed mobility comments: Pt received sitting in chair at start of session    Transfers Overall transfer level: Needs assistance Equipment used: Rolling walker (2 wheels) Transfers: Sit to/from Stand Sit to Stand: Min assist, Contact guard assist           General transfer comment: 3 STS from recliner during session for LE strengthening, min assist for first STS, CGA for remainder with stabilizing RW, no posterior lean of note    Ambulation/Gait Ambulation/Gait assistance: Min assist, Contact guard assist Gait Distance (Feet): 45 Feet Assistive device: Rolling walker (2 wheels) Gait Pattern/deviations: Decreased step length - right, Decreased step length - left, Trunk flexed, Shuffle Gait velocity: decreased     General Gait Details: pt ambulates into hall with RW, CGA needed for mild unsteadiness, pt demo shuffling of feet, multiple small steps required for turns, no overt LOB, limited due to fatigue   Stairs             Wheelchair Mobility     Tilt Bed    Modified Rankin (Stroke  Patients Only)       Balance Overall balance assessment: Needs assistance Sitting-balance support: Bilateral upper extremity supported, Feet supported Sitting balance-Leahy Scale: Good Sitting balance - Comments: good sitting balance on the edge of the bed   Standing balance support: Bilateral upper extremity supported, During functional activity, Reliant on  assistive device for balance Standing balance-Leahy Scale: Fair Standing balance comment: fair standing balance with RW                            Communication Communication Communication: Impaired Factors Affecting Communication: Hearing impaired;Other (comment) (HOH)  Cognition Arousal: Alert Behavior During Therapy: WFL for tasks assessed/performed   PT - Cognitive impairments: No apparent impairments                         Following commands: Intact      Cueing Cueing Techniques: Verbal cues, Visual cues  Exercises General Exercises - Lower Extremity Long Arc Quad: AROM, Both, 10 reps, Seated, Strengthening Hip ABduction/ADduction: AROM, Strengthening, Both, 10 reps, Seated Hip Flexion/Marching: AROM, Both, 10 reps, Seated, Strengthening Toe Raises: AROM, Strengthening, Both, 10 reps, Seated Heel Raises: AROM, Strengthening, Both, 10 reps, Seated    General Comments        Pertinent Vitals/Pain Pain Assessment Pain Assessment: No/denies pain    Home Living                          Prior Function            PT Goals (current goals can now be found in the care plan section) Acute Rehab PT Goals Patient Stated Goal: to return home PT Goal Formulation: With patient/family Time For Goal Achievement: 10/19/24 Potential to Achieve Goals: Good Progress towards PT goals: Progressing toward goals    Frequency    Min 3X/week      PT Plan      Co-evaluation              AM-PAC PT 6 Clicks Mobility   Outcome Measure  Help needed turning from your back to your side while in a flat bed without using bedrails?: A Lot Help needed moving from lying on your back to sitting on the side of a flat bed without using bedrails?: A Lot Help needed moving to and from a bed to a chair (including a wheelchair)?: A Little Help needed standing up from a chair using your arms (e.g., wheelchair or bedside chair)?: A Little Help needed  to walk in hospital room?: A Little Help needed climbing 3-5 steps with a railing? : A Lot 6 Click Score: 15    End of Session Equipment Utilized During Treatment: Gait belt Activity Tolerance: Patient tolerated treatment well Patient left: with call bell/phone within reach;with family/visitor present;in chair;with chair alarm set Nurse Communication: Mobility status PT Visit Diagnosis: Unsteadiness on feet (R26.81);Other abnormalities of gait and mobility (R26.89);Muscle weakness (generalized) (M62.81);Difficulty in walking, not elsewhere classified (R26.2)     Time: 1010-1029 PT Time Calculation (min) (ACUTE ONLY): 19 min  Charges:    $Therapeutic Exercise: 8-22 mins PT General Charges $$ ACUTE PT VISIT: 1 Visit                     12:18 PM, 10/07/2024 Pattie Flaharty Powell-Butler, PT, DPT [place] with [facility]

## 2024-10-07 NOTE — Progress Notes (Signed)
 Mobility Specialist Progress Note:    10/07/24 1010  Mobility  Activity Ambulated with assistance  Level of Assistance Minimal assist, patient does 75% or more  Assistive Device Front wheel walker  Distance Ambulated (ft) 35 ft  Range of Motion/Exercises Active;All extremities  Activity Response Tolerated well  Mobility Referral Yes  Mobility visit 1 Mobility  Mobility Specialist Start Time (ACUTE ONLY) 1010  Mobility Specialist Stop Time (ACUTE ONLY) 1030  Mobility Specialist Time Calculation (min) (ACUTE ONLY) 20 min   Pt received in chair, agreeable to mobility. Required MinA to stand and CGA to ambulate with RW. Tolerated well,asx throughout. Returned to chair, CHARITY FUNDRAISER and family in room. All needs met.  Tashauna Caisse Mobility Specialist Please contact via Special Educational Needs Teacher or  Rehab office at [phone]

## 2024-10-07 NOTE — Hospital Course (Signed)
 69 y.o. male with medical history significant of CAD status post stent, hypertension, hyperlipidemia, Parkinson's disease, orthostatic hypotension, former smoker who presents to the ER with complaints of increasing shortness of breath for the past 2 weeks.  Patient doesn't appear to be a good historian.  Sister at the bedside providing collateral information.  Patient has been having increasing shortness of breath for the past 2 weeks that has gotten worse today.  He has been having some cough as well with some sinus drainage.  Denies fever.  Patient denies any chest pain. Reports history of coronary artery disease with stenting twice in the past, most recent 1 in 2022.  Denies history of known heart failure.  He has history of A-fib on anticoagulation. Denies diagnosis of COPD says but reports smoking for more than 20 years before quitting 35 years ago.   ED Course: Patient was hypertensive with systolic blood pressure more than 200.  He was not hypoxic but was dyspneic.  He was short of breath with ambulation.  He was given a dose of Lasix  40 mg x 1 with good urine output.  Blood work notable for significantly elevated proBNP of 5900.  Chest x-ray with mild congestive heart failure. Given a dose of IV hydralazine  which did not improve his blood pressure much.  Given IV labetalol  2.   Hospitalist service was called to evaluate the patient for admission.

## 2024-10-07 NOTE — Telephone Encounter (Signed)
 Pharmacy Patient Advocate Encounter  Insurance verification completed.    The patient is insured through [employer]/ [employer]. Patient has Medicare and is not eligible for a copay card, but may be able to apply for patient assistance or Medicare RX Payment Plan (Patient Must reach out to their plan, if eligible for payment plan), if available.    Ran test claim for Generic Entresto 24-26mg  tablet and the current 30 day co-pay is $19.13.   This test claim was processed through [facility]- copay amounts may vary at other pharmacies due to pharmacy/plan contracts, or as the patient moves through the different stages of their insurance plan.

## 2024-10-07 NOTE — Progress Notes (Signed)
 Mobility Specialist Progress Note:    10/07/24 1500  Mobility  Activity Ambulated with assistance  Level of Assistance Minimal assist, patient does 75% or more  Assistive Device Front wheel walker  Distance Ambulated (ft) 35 ft  Range of Motion/Exercises Active;All extremities  Activity Response Tolerated well  Mobility Referral Yes  Mobility visit 1 Mobility  Mobility Specialist Start Time (ACUTE ONLY) 1500  Mobility Specialist Stop Time (ACUTE ONLY) 1520  Mobility Specialist Time Calculation (min) (ACUTE ONLY) 20 min   Pt received in chair, agreeable to mobility. Required MinA to stand and ambulate with RW. Tolerated well, some shuffling d/t Parkinson's. Returned to chair, alarm on. Family in room, all needs met.  Cailah Reach Mobility Specialist Please contact via Special Educational Needs Teacher or  Rehab office at [phone]

## 2024-10-07 NOTE — Progress Notes (Addendum)
 Heart Failure Stewardship Pharmacy Note  PCP: Grooms, [place], PA-C PCP-Cardiologist: Alvan Carrier, MD  HPI: Stephen Graves is a 69 y.o. male with CAD s/p stent, HTN, HLD, Parkinson's disease, orthostatic hypotension, former smoker, and hx of Afib on [facility] who presented with worsening SOB for the past 2 weeks.  On admission, proBNP was 5919, HS-troponin was 25, direct bilirubin 0.5 otherwise LFTs normal, CO2 21, Scr 0.99, and normal electrolytes. Chest x-ray noted mild congestive heart failure, probable left lower lobe subsegmental atelectasis and small bilateral pleural effusions.   Pertinent cardiac history: Reports history of coronary artery disease with stenting twice in the past, most recent 1 in 2022.  Denies history of known heart failure.  He has history of A-fib on anticoagulation. - LHC in 06/2017 and 05/2021.  - TTE 08/28/2022 noted LVEF 60-65%, severe LVH, left ventricular diastolic parameters indeterminate.  - TTE 10/06/23 noted LVEF 60-65%, severe concentric LVH, left ventricular diastolic parameters could not be evaluated due to atrial fibrillation, moderate pericardial effusion.   Pertinent Lab Values: Creatinine, Ser  Date Value Ref Range Status  10/06/2024 1.63 (H) 0.61 - 1.24 mg/dL Final   BUN  Date Value Ref Range Status  10/06/2024 28 (H) 8 - 23 mg/dL Final  89/71/7975 23 8 - 27 mg/dL Final   Potassium  Date Value Ref Range Status  10/06/2024 3.6 3.5 - 5.1 mmol/L Final   Sodium  Date Value Ref Range Status  10/06/2024 142 135 - 145 mmol/L Final  07/22/2023 140 134 - 144 mmol/L Final   B Natriuretic Peptide  Date Value Ref Range Status  04/29/2023 46.0 0.0 - 100.0 pg/mL Final    Comment:    Performed at [facility], [address], [place], [place] [address]   Magnesium  Date Value Ref Range Status  10/06/2024 2.5 (H) 1.7 - 2.4 mg/dL Final    Comment:    Performed at [facility], [address], [place], [place] [address]   Hemoglobin A1C  Date  Value Ref Range Status  05/11/2024 6.1  Final    Vital Signs: Temp:  [98.2 F (36.8 C)-99.1 F (37.3 C)] 99.1 F (37.3 C) (01/14 0414) Pulse Rate:  [65-96] 81 (01/14 0414) Cardiac Rhythm: Atrial fibrillation (01/13 0701) Resp:  [19] 19 (01/14 0414) BP: (100-180)/(60-113) 163/97 (01/14 0414) SpO2:  [94 %-100 %] 94 % (01/14 0414) Weight:  [98.8 kg (217 lb 13 oz)] 98.8 kg (217 lb 13 oz) (01/14 0414)  Intake/Output Summary (Last 24 hours) at 10/07/2024 0525 Last data filed at 10/06/2024 2041 Gross per 24 hour  Intake 1200 ml  Output 950 ml  Net 250 ml    Current Heart Failure Medications:  Loop diuretic: none Beta-Blocker: carvedilol  6.25 mg BID ACEI/ARB/ARNI: none MRA: none SGLT2i: none Other: amlodipine  5 mg daily  Prior to admission Heart Failure Medications:  Loop diuretic: none Beta-Blocker: carvedilol  6.25 mg BID ACEI/ARB/ARNI: losartan  12.5 mg daily MRA: none SGLT2i: none Other: none  Assessment: 1. Acute congestive heart failure (LVEF 60-65%), severe concentric LVH, left ventricular diastolic parameters could not be evaluated due to atrial fibrillation, moderate pericardial effusion, due to mixed NICM and ICM. NYHA class II-III symptoms.   Patient [facility] at [facility] with worsening SOB and admitted for uncontrolled hypertension and suspected new onset heart failure. Today, 10/07/24, K 3.6>3.6, Scr 1.49>1.63, BUN 27>28.  Unable to conduct visual exam. Visit conducted via telephone. -Symptoms: Reports minimal SOB, almost at baseline. Denies orthopnea. Reports appetite is good. Reports decreased urination since  furosemide  was stopped. Denies abdominal swelling or LEE. Reports significant fatigue, did not sleep well last night. Symptoms may be attributed to AF as well. -Volume: Unable to visually assess patient. Net output since admit: -5500 mL. Currently holding diuresis for elevated creatinine and BUN. Renal function improving today.  -Hemodynamics: BP significantly  elevated most of the time, occasionally down to 100-120s/60s mmHg. HR 60-80s. -ACEI/ARB/ARNI: Can consider restarting losartan  12.5 mg daily if BP stable on first and second line treatment for HFpEF. Scr improving, BP significantly elevated. Given patient's historically issues with fluctuating blood pressures and orthostatic hypotension, would prefer ARB over ARNI or ACEI due to reduced impact on BP. If BP remains significantly elevated, can consider Entresto 24/26 mg BID. -SGLT2i: Consider adding Farxiga 10 mg daily prior to discharge after urinary cath is removed. Reports right flank pain and discomfort with urination, however UA negative and CT rules out for stones. A1c 6.1% in 04/2024, consider rechecking. -MRA: Can consider adding spironolactone 12.5 mg daily as second line treatment for HFpEF.  Plan: 1) Medication changes recommended at this time: - Can consider adding spironolactone 12.5 mg daily.  2) Patient assistance: - Bernadine is $40.79 and Farxiga copay is $36.35. - Generic Entresto copay is $19.13.  3) Education: - Patient has been educated on current HF medications and potential additions to HF medication regimen - Patient verbalizes understanding that over the next few months, these medication doses may change and more medications may be added to optimize HF regimen - Patient has been educated on basic disease state pathophysiology and goals of therapy  Calton Nash, [email or web address] PharmD Candidate  Please do not hesitate to reach out with questions or concerns,  Jaun Bash, PharmD, CPP, BCPS, [facility] Heart Failure Pharmacist  Phone - [phone] 10/07/2024 5:25 AM

## 2024-10-07 NOTE — Plan of Care (Signed)
   Problem: Education: Goal: Knowledge of General Education information will improve Description: Including pain rating scale, medication(s)/side effects and non-pharmacologic comfort measures Outcome: Progressing   Problem: Activity: Goal: Risk for activity intolerance will decrease Outcome: Progressing   Problem: Nutrition: Goal: Adequate nutrition will be maintained Outcome: Progressing

## 2024-10-07 NOTE — TOC Progression Note (Signed)
 Transition of Care [facility]) - Progression Note    Patient Details  Name: Stephen Graves MRN: [id number] Date of Birth: 09/12/1956  [facility] CM/SW Contact  Sharlyne Stabs, RN Phone Number: 10/07/2024, 12:29 PM  Clinical Narrative:   [facility] is unsure when they will have a male bed.  [facility]. CM at the beside to discuss with patient and his sister. They are agreeable. Bed selected and INS Auth started with [facility]TA.  Sister is agreeable to transport the patient, Kristin at [facility] updated, Sister is going to meet them today at 1Pm to sign consent.      Barriers to Discharge: Continued Medical Work up    Expected Discharge Plan and Services      Living arrangements for the past 2 months: Single Family Home                   Social Drivers of Health (SDOH) Interventions SDOH Screenings   Food Insecurity: No Food Insecurity (10/04/2024)  Housing: Low Risk (10/04/2024)  Transportation Needs: No Transportation Needs (10/04/2024)  Utilities: Not At Risk (10/04/2024)  Social Connections: Moderately Integrated (10/04/2024)  Tobacco Use: Medium Risk (10/04/2024)    Readmission Risk Interventions     No data to display

## 2024-10-07 NOTE — Progress Notes (Signed)
 " PROGRESS NOTE   Stephen Graves  FMW:[id number] DOB: 01/05/1956 DOA: 10/04/2024 PCP: Mancil Pfeiffer, PA-C   Chief Complaint  Patient presents with   Shortness of Breath   Level of care: Med-Surg  Brief Admission History:  69 y.o. male with medical history significant of CAD status post stent, hypertension, hyperlipidemia, Parkinson's disease, orthostatic hypotension, former smoker who presents to the ER with complaints of increasing shortness of breath for the past 2 weeks.  Patient doesn't appear to be a good historian.  Sister at the bedside providing collateral information.  Patient has been having increasing shortness of breath for the past 2 weeks that has gotten worse today.  He has been having some cough as well with some sinus drainage.  Denies fever.  Patient denies any chest pain. Reports history of coronary artery disease with stenting twice in the past, most recent 1 in 2022.  Denies history of known heart failure.  He has history of A-fib on anticoagulation. Denies diagnosis of COPD says but reports smoking for more than 20 years before quitting 35 years ago.   ED Course: Patient was hypertensive with systolic blood pressure more than 200.  He was not hypoxic but was dyspneic.  He was short of breath with ambulation.  He was given a dose of Lasix  40 mg x 1 with good urine output.  Blood work notable for significantly elevated proBNP of 5900.  Chest x-ray with mild congestive heart failure. Given a dose of IV hydralazine  which did not improve his blood pressure much.  Given IV labetalol  2.   Hospitalist service was called to evaluate the patient for admission.   Assessment and Plan:  Uncontrolled hypertension Suspected new onset heart failure   -diuresed well with Lasix  40 mg twice daily, creatinine unfortunately rising. - Will hold Lasix  today and hopefully can resume tomorrow - Continue to monitor intake and output, fluid restriction, low-salt diet Transthoracic  echocardiogram 1/12: -Normal LVEF, severe concentric LVH, severe biatrial enlargement, moderate pericardial effusion.  Mild aortic root dilatation 4.2cm Sister reported that he has been drinking plenty of water  and takes a lot of salt containing food.   Uncontrolled hypertension: History of orthostatic hypotension Blood pressure more than 200/131mmHg  in the ER.  Received IV hydralazine , IV labetalol . Patient historically had issues with fluctuating blood pressures and orthostatic hypotension.  Will be cautious. -Will continue continue Coreg  6.25 twice daily, hold losartan  due to rising creatinine.   Strict intake and output, daily body weight, fluid restriction 2 L/d low-salt diet Daily electrolytes and BMP checks   Coronary artery disease status post stent -Antiplatelets were discontinued after Eliquis  started for A-fib in 2024   Parkinson's: Resume Sinemet  at home dosing   A-fib on anticoagulation: Continue carvedilol , Eliquis .  Rate is controlled   AKI on CKD: Baseline creatinine 1-1.5.  0.99 on admission, rose to 1.63. Will hold diuretics today, holding losartan    Hyperlipidemia: Continue statin   Type 2 diabetes: Sliding scale insulin , hold metformin  for now.  Hemoglobin A1c 6.1 on 05/11/2024   Urinary symptoms: Patient reports right flank discomfort with urination.  UA is negative.  CT rules out stones.  CT is concerning for significant stool load.  Will start MiraLAX  twice daily    Generalized weakness/Parkinson's:  Patient lives by himself.  PT evaluation completed, or skilled nursing facility recommended.   DVT prophylaxis: apixaban  Code Status:  Family Communication:  Disposition:    Consultants:   Procedures:   Antimicrobials:    Subjective:  Pt reports feeling better.  He is much less SOB today.   Objective: Vitals:   10/06/24 2032 10/07/24 0414 10/07/24 0805 10/07/24 1301  BP: (!) 148/93 (!) 163/97  (!) 128/94  Pulse: 65 81 78 78  Resp:  19    Temp:  98.6 F (37 C) 99.1 F (37.3 C)  98.2 F (36.8 C)  TempSrc: Oral Oral  Oral  SpO2: 96% 94% 98% 97%  Weight:  98.8 kg    Height:        Intake/Output Summary (Last 24 hours) at 10/07/2024 1714 Last data filed at 10/07/2024 1536 Gross per 24 hour  Intake 1680 ml  Output 1700 ml  Net -20 ml   Filed Weights   10/05/24 0430 10/06/24 0500 10/07/24 0414  Weight: 98.7 kg 97.7 kg 98.8 kg   Examination:  General exam: Appears calm and comfortable  Respiratory system: Clear to auscultation. Respiratory effort normal. Cardiovascular system: normal S1 & S2 heard. No JVD, murmurs, rubs, gallops or clicks. No pedal edema. Gastrointestinal system: Abdomen is nondistended, soft and nontender. No organomegaly or masses felt. Normal bowel sounds heard. Central nervous system: Alert and oriented. No focal neurological deficits. Extremities: Symmetric 5 x 5 power. Skin: No rashes, lesions or ulcers. Psychiatry: Judgement and insight appear normal. Mood & affect appropriate.   Data Reviewed: I have personally reviewed following labs and imaging studies  CBC: Recent Labs  Lab 10/04/24 1241 10/05/24 0431  WBC 6.2 7.6  HGB 11.1* 11.1*  HCT 37.1* 35.9*  MCV 91.2 89.5  PLT 139* 128*    Basic Metabolic Panel: Recent Labs  Lab 10/04/24 1241 10/05/24 0431 10/06/24 0404 10/07/24 0433  NA 139 141 142 140  K 4.4 3.6 3.6 3.6  CL 104 102 101 102  CO2 21* 30 32 27  GLUCOSE 121* 109* 99 100*  BUN 22 23 28* 27*  CREATININE 0.99 1.49* 1.63* 1.49*  CALCIUM  9.2 9.1 8.9 8.8*  MG  --  2.3 2.5*  --     CBG: Recent Labs  Lab 10/06/24 1613 10/06/24 2101 10/07/24 0745 10/07/24 1102 10/07/24 1607  GLUCAP 127* 124* 190* 153* 156*    Recent Results (from the past 240 hours)  Resp panel by RT-PCR (RSV, Flu A&B, Covid) Anterior Nasal Swab     Status: None   Collection Time: 10/04/24 12:54 PM   Specimen: Anterior Nasal Swab  Result Value Ref Range Status   SARS Coronavirus 2 by RT PCR  NEGATIVE NEGATIVE Final    Comment: (NOTE) SARS-CoV-2 target nucleic acids are NOT DETECTED.  The SARS-CoV-2 RNA is generally detectable in upper respiratory specimens during the acute phase of infection. The lowest concentration of SARS-CoV-2 viral copies this assay can detect is 138 copies/mL. A negative result does not preclude SARS-Cov-2 infection and should not be used as the sole basis for treatment or other patient management decisions. A negative result may occur with  improper specimen collection/handling, submission of specimen other than nasopharyngeal swab, presence of viral mutation(s) within the areas targeted by this assay, and inadequate number of viral copies(<138 copies/mL). A negative result must be combined with clinical observations, patient history, and epidemiological information. The expected result is Negative.  Fact Sheet for Patients:  bloggercourse.com  Fact Sheet for Healthcare Providers:  seriousbroker.it  This test is no t yet approved or cleared by the [place]  FDA and  has been authorized for detection and/or diagnosis of SARS-CoV-2 by FDA under an Emergency Use Authorization (EUA). This EUA  will remain  in effect (meaning this test can be used) for the duration of the COVID-19 declaration under Section 564(b)(1) of the Act, 21 U.S.C.section 360bbb-3(b)(1), unless the authorization is terminated  or revoked sooner.       Influenza A by PCR NEGATIVE NEGATIVE Final   Influenza B by PCR NEGATIVE NEGATIVE Final    Comment: (NOTE) The Xpert Xpress SARS-CoV-2/FLU/RSV plus assay is intended as an aid in the diagnosis of influenza from Nasopharyngeal swab specimens and should not be used as a sole basis for treatment. Nasal washings and aspirates are unacceptable for Xpert Xpress SARS-CoV-2/FLU/RSV testing.  Fact Sheet for Patients: bloggercourse.com  Fact Sheet for  Healthcare Providers: seriousbroker.it  This test is not yet approved or cleared by the [place]  FDA and has been authorized for detection and/or diagnosis of SARS-CoV-2 by FDA under an Emergency Use Authorization (EUA). This EUA will remain in effect (meaning this test can be used) for the duration of the COVID-19 declaration under Section 564(b)(1) of the Act, 21 U.S.C. section 360bbb-3(b)(1), unless the authorization is terminated or revoked.     Resp Syncytial Virus by PCR NEGATIVE NEGATIVE Final    Comment: (NOTE) Fact Sheet for Patients: bloggercourse.com  Fact Sheet for Healthcare Providers: seriousbroker.it  This test is not yet approved or cleared by the [place]  FDA and has been authorized for detection and/or diagnosis of SARS-CoV-2 by FDA under an Emergency Use Authorization (EUA). This EUA will remain in effect (meaning this test can be used) for the duration of the COVID-19 declaration under Section 564(b)(1) of the Act, 21 U.S.C. section 360bbb-3(b)(1), unless the authorization is terminated or revoked.  Performed at [facility], [address], [place], [place] [address]   MRSA Next Gen by PCR, Nasal     Status: None   Collection Time: 10/04/24  1:56 PM   Specimen: Nasal Mucosa; Nasal Swab  Result Value Ref Range Status   MRSA by PCR Next Gen NOT DETECTED NOT DETECTED Final    Comment: (NOTE) The GeneXpert MRSA Assay (FDA approved for NASAL specimens only), is one component of a comprehensive MRSA colonization surveillance program. It is not intended to diagnose MRSA infection nor to guide or monitor treatment for MRSA infections. Test performance is not FDA approved in patients less than 30 years old. Performed at [facility], [address] [place], [place] [address]     Radiology Studies: No results found.  Scheduled Meds:  amLODipine   5 mg Oral Daily   apixaban   5  mg Oral BID   carbidopa -levodopa   2 tablet Oral TID   carvedilol   6.25 mg Oral BID WC   insulin  aspart  0-15 Units Subcutaneous TID WC   polyethylene glycol  17 g Oral BID   rosuvastatin   20 mg Oral Daily   Continuous Infusions:   LOS: 2 days   Time spent: 44 mins  Felder Lebeda Vicci, MD How to contact the [facility] Attending or Consulting provider 7A - 7P or covering provider during after hours 7P -7A, for this patient?  Check the care team in [facility] and look for a) attending/consulting TRH provider listed and b) the TRH team listed Log into [email or web address] to find provider on call.  Locate the TRH provider you are looking for under [facility] and page to a number that you can be directly reached. If you still have difficulty reaching the provider, please page the [facility] (Director on Call) for the Hospitalists listed on amion for assistance.  10/07/2024,  5:14 PM    "

## 2024-10-08 DIAGNOSIS — E119 Type 2 diabetes mellitus without complications: Secondary | ICD-10-CM | POA: Diagnosis not present

## 2024-10-08 DIAGNOSIS — I25110 Atherosclerotic heart disease of native coronary artery with unstable angina pectoris: Secondary | ICD-10-CM

## 2024-10-08 DIAGNOSIS — N1831 Chronic kidney disease, stage 3a: Secondary | ICD-10-CM

## 2024-10-08 DIAGNOSIS — I509 Heart failure, unspecified: Secondary | ICD-10-CM | POA: Diagnosis not present

## 2024-10-08 DIAGNOSIS — Z955 Presence of coronary angioplasty implant and graft: Secondary | ICD-10-CM

## 2024-10-08 DIAGNOSIS — I3139 Other pericardial effusion (noninflammatory): Secondary | ICD-10-CM

## 2024-10-08 DIAGNOSIS — I5033 Acute on chronic diastolic (congestive) heart failure: Secondary | ICD-10-CM | POA: Diagnosis not present

## 2024-10-08 DIAGNOSIS — G20A1 Parkinson's disease without dyskinesia, without mention of fluctuations: Secondary | ICD-10-CM

## 2024-10-08 LAB — BASIC METABOLIC PANEL WITH GFR
Anion gap: 9 (ref 5–15)
BUN: 23 mg/dL (ref 8–23)
CO2: 27 mmol/L (ref 22–32)
Calcium: 9.1 mg/dL (ref 8.9–10.3)
Chloride: 104 mmol/L (ref 98–111)
Creatinine, Ser: 1.27 mg/dL — ABNORMAL HIGH (ref 0.61–1.24)
GFR, Estimated: >60 mL/min
Glucose, Bld: 107 mg/dL — ABNORMAL HIGH (ref 70–99)
Potassium: 4.2 mmol/L (ref 3.5–5.1)
Sodium: 139 mmol/L (ref 135–145)

## 2024-10-08 LAB — GLUCOSE, CAPILLARY
Glucose-Capillary: 129 mg/dL — ABNORMAL HIGH (ref 70–99)
Glucose-Capillary: 133 mg/dL — ABNORMAL HIGH (ref 70–99)
Glucose-Capillary: 157 mg/dL — ABNORMAL HIGH (ref 70–99)

## 2024-10-08 MED ORDER — POTASSIUM CHLORIDE CRYS ER 10 MEQ PO TBCR: 10.0000 meq | EXTENDED_RELEASE_TABLET | Freq: Every day | ORAL | Status: AC

## 2024-10-08 MED ORDER — AMLODIPINE BESYLATE 5 MG PO TABS: 5.0000 mg | ORAL_TABLET | Freq: Every day | ORAL | Status: AC

## 2024-10-08 MED ORDER — METFORMIN HCL ER 500 MG PO TB24: 500.0000 mg | ORAL_TABLET | Freq: Every day | ORAL | Status: AC

## 2024-10-08 MED ORDER — FUROSEMIDE 20 MG PO TABS: 20.0000 mg | ORAL_TABLET | Freq: Every day | ORAL | Status: DC

## 2024-10-08 MED ORDER — POLYETHYLENE GLYCOL 3350 17 G PO PACK: 17.0000 g | PACK | Freq: Every day | ORAL | Status: AC

## 2024-10-08 NOTE — Discharge Summary (Signed)
 Physician Discharge Summary  Stephen Graves FMW:[id number] DOB: [birth year] DOA: 10/04/2024  PCP: Mancil Pfeiffer, [place] Cardiologist: Dr. Alvan  Admit date: 10/04/2024 Discharge date: 10/08/2024  Disposition:  SNF   Recommendations for Outpatient Follow-up:  Follow up with PCP in 2 weeks Follow up with cardiology in 2 weeks Please obtain BMP/CBC in 1 week to monitor renal function and electrolytes Please send results of labs to PCP and cardiology  Please monitor blood glucose at least 3 times per day Please record standing weights daily and send information to PCP and cardiology appointments  Home Health:  SNF   Discharge Condition: STABLE   CODE STATUS: FULL DIET: Heart healthy carb modified 2 gram sodium, fluid restriction 1.8 L per day   Brief Hospitalization Summary: Please see all hospital notes, images, labs for full details of the hospitalization. Admission provider HPI:  69 y.o. male with medical history significant of CAD status post stent, hypertension, hyperlipidemia, Parkinson's disease, orthostatic hypotension, former smoker who presents to the ER with complaints of increasing shortness of breath for the past 2 weeks.  Patient doesn't appear to be a good historian.  Sister at the bedside providing collateral information.  Patient has been having increasing shortness of breath for the past 2 weeks that has gotten worse today.  He has been having some cough as well with some sinus drainage.  Denies fever.  Patient denies any chest pain.  Reports history of coronary artery disease with stenting twice in the past, most recent 1 in 2022.  Denies history of known heart failure.  He has history of A-fib on anticoagulation.  Denies diagnosis of COPD says but reports smoking for more than 20 years before quitting 35 years ago.   ED Course: Patient was hypertensive with systolic blood pressure more than 200.  He was not hypoxic but was dyspneic.  He was short of breath with  ambulation.  He was given a dose of Lasix  40 mg x 1 with good urine output.  Blood work notable for significantly elevated proBNP of 5900.  Chest x-ray with mild congestive heart failure.  Given a dose of IV hydralazine  which did not improve his blood pressure much.  Given IV labetalol  2.   Hospitalist service was called to evaluate the patient for admission.  HOSPITAL COURSE BY LISTED PROBLEMS ADDRESSED   Uncontrolled hypertension Suspected new onset acute heart failure preserved EF    -diuresed well with Lasix  40 mg twice daily with good results - Held lasix  briefly due to bump in creatinine - creatinine now improved to 1.27 - resuming lasix  oral 20 mg daily with potassium supplement - Continue to monitor intake and output, fluid restriction, low-salt diet - orders to check BMP in 1 week at SNF  Transthoracic echocardiogram 1/12: Normal LVEF, severe concentric LVH, severe biatrial enlargement, moderate pericardial effusion.  Mild aortic root dilatation 4.2cm Sister reported that he had been drinking plenty of water  and takes a lot of salt containing food. -ambulatory referral order placed for cardiology follow up appt in 2 weeks if possible    Uncontrolled hypertension:  IMPROVING  History of orthostatic hypotension Blood pressure more than 200/154mmHg  in the ER.  Received IV hydralazine , IV labetalol . Patient historically had issues with fluctuating blood pressures and orthostatic hypotension.  Will be cautious. -Will continue continue Coreg  6.25 twice daily, resumed home losartan  and started amlodipine  5 mg.   Strict intake and output, daily body weight, fluid restriction 2 L/d low-salt diet -BPs much improved, further  titration and management outpatient -follow up with PCP and cardiology in 2 weeks recommended   Coronary artery disease status post stent -Antiplatelets were discontinued after Eliquis  started for A-fib in 2024   Parkinson's: Resumed Sinemet  at home dosing    A-fib on anticoagulation: Continue carvedilol , Eliquis .  Rate is well controlled   AKI on CKD: RESOLVED Baseline creatinine 1-1.5.  0.99 on admission, rose to 1.63. Creatinine now improved to 1.27 Resumed home losartan  Resumed lasix  20 mg daily with potassium supplement   Hyperlipidemia: Continue statin   Type 2 diabetes: Sliding scale insulin , hold metformin  for now.  Hemoglobin A1c 6.1 on 05/11/2024  CBG (last 3)  Recent Labs    10/07/24 1607 10/07/24 1920 10/08/24 0713  GLUCAP 156* 98 129*    Urinary symptoms: Patient reports right flank discomfort with urination.  UA is negative.  CT rules out stones.  CT is concerning for significant stool load.  Will start MiraLAX  twice daily    Generalized weakness/Parkinson's:  Patient lives by himself.  PT evaluation completed, or skilled nursing facility recommended.   Discharge Diagnoses:  Principal Problem:   Acute exacerbation of CHF (congestive heart failure) (HCC) Active Problems:   Essential hypertension   Hyperlipidemia with target LDL less than 70   Coronary artery disease involving native coronary 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 (HCC)   Presence of drug coated stent in LAD coronary artery   Controlled type 2 diabetes mellitus without complication, without long-term current use of insulin  (HCC)   Parkinson disease (HCC)   A-fib (HCC)   Slow transit constipation   CKD stage 3a, GFR 45-59 ml/min (HCC)   Pericardial effusion   (HFpEF) heart failure with preserved ejection fraction [facility])   Discharge Instructions: Discharge Instructions     Ambulatory referral to Cardiology   Complete by: As directed       Allergies as of 10/08/2024       Reactions   Lipitor [atorvastatin]    Pain in neck and legs after taking medication        Medication List     TAKE these medications    carbidopa -levodopa  25-100 MG tablet Commonly known as: SINEMET  IR Take 2 tablets by mouth 3 (three) times  daily. The timing of this medication is very important.   amLODipine  5 MG tablet Commonly known as: NORVASC  Take 1 tablet (5 mg total) by mouth daily. Start taking on: October 09, 2024   apixaban  5 MG Tabs tablet Commonly known as: Eliquis  Take 1 tablet (5 mg total) by mouth 2 (two) times daily.   carvedilol  6.25 MG tablet Commonly known as: COREG  Take 1 tablet (6.25 mg total) by mouth 2 (two) times daily.   citalopram  40 MG tablet Commonly known as: CELEXA  Take 1 tablet (40 mg total) by mouth daily.   fish oil-omega-3 fatty acids  1000 MG capsule Take 1 g by mouth daily.   furosemide  20 MG tablet Commonly known as: Lasix  Take 1 tablet (20 mg total) by mouth daily. Start taking on: October 09, 2024   losartan  25 MG tablet Commonly known as: COZAAR  Take 0.5 tablets (12.5 mg total) by mouth daily. If systolic BP is greater than 140   metFORMIN  500 MG 24 hr tablet Commonly known as: GLUCOPHAGE -XR Take 1 tablet (500 mg total) by mouth daily. Start taking on: October 09, 2024   nitroGLYCERIN  0.4 MG SL tablet Commonly known as: NITROSTAT  Place 1 tablet (0.4 mg total) under the tongue every 5 (five)  minutes as needed for chest pain.   polyethylene glycol 17 g packet Commonly known as: MIRALAX  / GLYCOLAX  Take 17 g by mouth daily.   potassium chloride  10 MEQ tablet Commonly known as: KLOR-CON  M Take 1 tablet (10 mEq total) by mouth daily. Take with furosemide  (lasix ) Start taking on: October 10, 2024   rosuvastatin  20 MG tablet Commonly known as: CRESTOR  Take 1 tablet (20 mg total) by mouth daily.   Vitamin D3 25 MCG (1000 UT) Caps Take 1 capsule by mouth daily.        Contact information for follow-up providers     [place], [place], [place]. Schedule an appointment as soon as possible for a visit in 2 week(s).   Specialty: Physician Assistant Why: Hospital Follow Up Contact information: [address] [place] [address] [phone]          [facility]. Schedule an appointment as soon as possible for a visit in 2 week(s).   Specialty: Cardiology Why: Hospital Follow Up Contact information: [address] [place] [address]  [address] [phone]             Contact information for after-discharge care     Destination     [facility] .   Service: Skilled Nursing Contact information: [address] [place]  [address] [phone]                    Allergies[1] Allergies as of 10/08/2024       Reactions   Lipitor [atorvastatin]    Pain in neck and legs after taking medication        Medication List     TAKE these medications    carbidopa -levodopa  25-100 MG tablet Commonly known as: SINEMET  IR Take 2 tablets by mouth 3 (three) times daily. The timing of this medication is very important.   amLODipine  5 MG tablet Commonly known as: NORVASC  Take 1 tablet (5 mg total) by mouth daily. Start taking on: October 09, 2024   apixaban  5 MG Tabs tablet Commonly known as: Eliquis  Take 1 tablet (5 mg total) by mouth 2 (two) times daily.   carvedilol  6.25 MG tablet Commonly known as: COREG  Take 1 tablet (6.25 mg total) by mouth 2 (two) times daily.   citalopram  40 MG tablet Commonly known as: CELEXA  Take 1 tablet (40 mg total) by mouth daily.   fish oil-omega-3 fatty acids  1000 MG capsule Take 1 g by mouth daily.   furosemide  20 MG tablet Commonly known as: Lasix  Take 1 tablet (20 mg total) by mouth daily. Start taking on: October 09, 2024   losartan  25 MG tablet Commonly known as: COZAAR  Take 0.5 tablets (12.5 mg total) by mouth daily. If systolic BP is greater than 140   metFORMIN  500 MG 24 hr tablet Commonly known as: GLUCOPHAGE -XR Take 1 tablet (500 mg total) by mouth daily. Start taking on: October 09, 2024   nitroGLYCERIN  0.4 MG SL tablet Commonly known as: NITROSTAT  Place 1 tablet (0.4 mg  total) under the tongue every 5 (five) minutes as needed for chest pain.   polyethylene glycol 17 g packet Commonly known as: MIRALAX  / GLYCOLAX  Take 17 g by mouth daily.   potassium chloride  10 MEQ tablet Commonly known as: KLOR-CON  M Take 1 tablet (10 mEq total) by mouth daily. Take with furosemide  (lasix ) Start taking on: October 10, 2024   rosuvastatin  20 MG tablet Commonly known as:  CRESTOR  Take 1 tablet (20 mg total) by mouth daily.   Vitamin D3 25 MCG (1000 UT) Caps Take 1 capsule by mouth daily.        Procedures/Studies: ECHOCARDIOGRAM COMPLETE Result Date: 10/06/2024    ECHOCARDIOGRAM REPORT   Patient Name:   Stephen Graves Date of Exam: 10/05/2024 Medical Rec #:  [id number]           Height:       72.0 in Accession #:    [id number]          Weight:       217.6 lb Date of Birth:  1955/10/02           BSA:          2.208 m Patient Age:    68 years            BP:           119/87 mmHg Patient Gender: M                   HR:           85 bpm. Exam Location:  [facility] Procedure: 2D Echo (Both Spectral and Color Flow Doppler were utilized during            procedure). Indications:    Congestive Heart Failure I50.9  History:        Patient has prior history of Echocardiogram examinations, most                 recent 08/28/2022. CHF, CAD, Arrythmias:Atrial Fibrillation; Risk                 Factors:Hypertension, Diabetes and Dyslipidemia. Hx of COVID-19.  Sonographer:    Aida Pizza RCS Referring Phys: [id number] SANTOSH SIGDEL IMPRESSIONS  1. Left ventricular ejection fraction, by estimation, is 60 to 65%. The left ventricle has normal function. The left ventricle has no regional wall motion abnormalities. There is severe concentric left ventricular hypertrophy. Left ventricular diastolic  function could not be evaluated.  2. Right ventricular systolic function is normal. The right ventricular size is normal. Tricuspid regurgitation signal is inadequate for assessing PA pressure.  3.  Left atrial size was severely dilated.  4. Right atrial size was severely dilated.  5. Moderate pericardial effusion. The pericardial effusion is posterior and lateral to the left ventricle.  6. The mitral valve is normal in structure. Mild mitral valve regurgitation. No evidence of mitral stenosis.  7. The aortic valve is tricuspid. There is mild calcification of the aortic valve. Aortic valve regurgitation is not visualized. No aortic stenosis is present. Aortic valve mean gradient measures 5.0 mmHg.  8. Aortic dilatation noted. There is mild dilatation of the aortic root, measuring 42 mm.  9. The inferior vena cava is dilated in size with >50% respiratory variability, suggesting right atrial pressure of 8 mmHg. FINDINGS  Left Ventricle: Left ventricular ejection fraction, by estimation, is 60 to 65%. The left ventricle has normal function. The left ventricle has no regional wall motion abnormalities. Strain was performed and the global longitudinal strain is indeterminate. The left ventricular internal cavity size was normal in size. There is severe concentric left ventricular hypertrophy. Left ventricular diastolic function could not be evaluated due to atrial fibrillation. Left ventricular diastolic function could not be evaluated. Right Ventricle: The right ventricular size is normal. No increase in right ventricular wall thickness. Right ventricular systolic function is normal. Tricuspid regurgitation signal is inadequate for  assessing PA pressure. Left Atrium: Left atrial size was severely dilated. Right Atrium: Right atrial size was severely dilated. Pericardium: A moderately sized pericardial effusion is present. The pericardial effusion is posterior and lateral to the left ventricle. Mitral Valve: The mitral valve is normal in structure. Mild mitral valve regurgitation. No evidence of mitral valve stenosis. Tricuspid Valve: The tricuspid valve is normal in structure. Tricuspid valve regurgitation is mild  . No evidence of tricuspid stenosis. Aortic Valve: The aortic valve is tricuspid. There is mild calcification of the aortic valve. Aortic valve regurgitation is not visualized. No aortic stenosis is present. Aortic valve mean gradient measures 5.0 mmHg. Aortic valve peak gradient measures 9.2 mmHg. Aortic valve area, by VTI measures 2.98 cm. Pulmonic Valve: The pulmonic valve was normal in structure. Pulmonic valve regurgitation is trivial. No evidence of pulmonic stenosis. Aorta: Aortic dilatation noted. There is mild dilatation of the aortic root, measuring 42 mm. Venous: The inferior vena cava is dilated in size with greater than 50% respiratory variability, suggesting right atrial pressure of 8 mmHg. IAS/Shunts: No atrial level shunt detected by color flow Doppler. Additional Comments: 3D was performed not requiring image post processing on an independent workstation and was indeterminate.  LEFT VENTRICLE PLAX 2D LVIDd:         5.70 cm LVIDs:         3.60 cm LV PW:         1.50 cm LV IVS:        1.60 cm LVOT diam:     2.20 cm LV SV:         83 LV SV Index:   38 LVOT Area:     3.80 cm  RIGHT VENTRICLE RV S prime:     10.40 cm/s TAPSE (M-mode): 1.8 cm LEFT ATRIUM            Index        RIGHT ATRIUM           Index LA diam:      4.70 cm  2.13 cm/m   RA Area:     30.00 cm LA Vol (A2C): 113.0 ml 51.17 ml/m  RA Volume:   99.00 ml  44.83 ml/m LA Vol (A4C): 122.0 ml 55.25 ml/m  AORTIC VALVE AV Area (Vmax):    2.95 cm AV Area (Vmean):   3.07 cm AV Area (VTI):     2.98 cm AV Vmax:           152.00 cm/s AV Vmean:          102.000 cm/s AV VTI:            0.279 m AV Peak Grad:      9.2 mmHg AV Mean Grad:      5.0 mmHg LVOT Vmax:         118.00 cm/s LVOT Vmean:        82.300 cm/s LVOT VTI:          0.219 m LVOT/AV VTI ratio: 0.78  AORTA Ao Root diam: 4.20 cm MITRAL VALVE MV Area (PHT): 3.12 cm     SHUNTS MV Decel Time: 243 msec     Systemic VTI:  0.22 m MV E velocity: 120.00 cm/s  Systemic Diam: 2.20 cm Vishnu  Priya Mallipeddi Electronically signed by Diannah Late Mallipeddi Signature Date/Time: 10/06/2024/9:33:10 AM    Final    CT RENAL STONE STUDY Result Date: 10/04/2024 CLINICAL DATA:  Abdominal and flank pain EXAM: CT ABDOMEN AND PELVIS  WITHOUT CONTRAST TECHNIQUE: Multidetector CT imaging of the abdomen and pelvis was performed following the standard protocol without IV contrast. RADIATION DOSE REDUCTION: This exam was performed according to the departmental dose-optimization program which includes automated exposure control, adjustment of the mA and/or kV according to patient size and/or use of iterative reconstruction technique. COMPARISON:  08/27/2022 FINDINGS: Lower chest: Mild cardiomegaly with stable small pericardial effusion. There are small free-flowing bilateral pleural effusions. Hepatobiliary: Unremarkable unenhanced appearance of the liver and gallbladder. No biliary duct dilation. Pancreas: Unremarkable unenhanced appearance. Spleen: Unremarkable unenhanced appearance. Adrenals/Urinary Tract: The adrenals are unremarkable. No urinary tract calculi or obstructive uropathy within either kidney. Bladder is unremarkable. Stomach/Bowel: No bowel obstruction or ileus. Moderate stool throughout the colon which may reflect constipation. Normal appendix right lower quadrant. Scattered sigmoid diverticulosis without diverticulitis. No bowel wall thickening or inflammatory change. Vascular/Lymphatic: Aortic atherosclerosis. No enlarged abdominal or pelvic lymph nodes. Reproductive: Prostate is unremarkable. Other: No free fluid or free intraperitoneal gas. No abdominal wall hernia. Musculoskeletal: No acute or destructive bony abnormalities. Reconstructed images demonstrate no additional findings. IMPRESSION: 1. No urinary tract calculi or obstructive uropathy. 2. Moderate stool throughout the colon consistent with constipation. No bowel obstruction or ileus. 3. Mild cardiomegaly and small pericardial effusion.  4. Small free-flowing bilateral pleural effusions. 5. Sigmoid diverticulosis without diverticulitis. 6.  Aortic Atherosclerosis (ICD10-I70.0). Electronically Signed   By: Ozell Daring M.D.   On: 10/04/2024 15:49   DG Chest 2 View Result Date: 10/04/2024 CLINICAL DATA:  Shortness of breath, congestion. EXAM: CHEST - 2 VIEW COMPARISON:  08/27/2022 and CT chest 08/27/2022. FINDINGS: Trachea is midline. Heart is enlarged. Mild interstitial prominence and indistinctness bilaterally. Probable left lower lobe subsegmental atelectasis. Small bilateral pleural effusions. Flowing anterior osteophytosis in the thoracic spine. IMPRESSION: Mild congestive heart failure. Electronically Signed   By: Newell Eke M.D.   On: 10/04/2024 13:48     Subjective: Pt sitting up in chair, he says he feels well and he is eager to get out of the hospital and agreeable to going to SNF rehab  Discharge Exam: Vitals:   10/08/24 0424 10/08/24 0821  BP: (!) 180/119 (!) 158/99  Pulse: 73   Resp: 17   Temp: 98.1 F (36.7 C)   SpO2: 96%    Vitals:   10/07/24 1301 10/07/24 1954 10/08/24 0424 10/08/24 0821  BP: (!) 128/94 (!) 156/105 (!) 180/119 (!) 158/99  Pulse: 78 (!) 43 73   Resp:  19 17   Temp: 98.2 F (36.8 C) (!) 97.5 F (36.4 C) 98.1 F (36.7 C)   TempSrc: Oral Oral Oral   SpO2: 97% 97% 96%   Weight:   99.2 kg   Height:       General: Pt is alert, awake, not in acute distress Cardiovascular: normal S1/S2 +, no rubs, no gallops Respiratory: CTA bilaterally, no wheezing, no rhonchi Abdominal: Soft, NT, ND, bowel sounds + Extremities: trace bilateral LE and pedal edema, no cyanosis   The results of significant diagnostics from this hospitalization (including imaging, microbiology, ancillary and laboratory) are listed below for reference.     Microbiology: Recent Results (from the past 240 hours)  Resp panel by RT-PCR (RSV, Flu A&B, Covid) Anterior Nasal Swab     Status: None   Collection Time:  10/04/24 12:54 PM   Specimen: Anterior Nasal Swab  Result Value Ref Range Status   SARS Coronavirus 2 by RT PCR NEGATIVE NEGATIVE Final    Comment: (NOTE) SARS-CoV-2 target nucleic acids  are NOT DETECTED.  The SARS-CoV-2 RNA is generally detectable in upper respiratory specimens during the acute phase of infection. The lowest concentration of SARS-CoV-2 viral copies this assay can detect is 138 copies/mL. A negative result does not preclude SARS-Cov-2 infection and should not be used as the sole basis for treatment or other patient management decisions. A negative result may occur with  improper specimen collection/handling, submission of specimen other than nasopharyngeal swab, presence of viral mutation(s) within the areas targeted by this assay, and inadequate number of viral copies(<138 copies/mL). A negative result must be combined with clinical observations, patient history, and epidemiological information. The expected result is Negative.  Fact Sheet for Patients:  bloggercourse.com  Fact Sheet for Healthcare Providers:  seriousbroker.it  This test is no t yet approved or cleared by the [place]  FDA and  has been authorized for detection and/or diagnosis of SARS-CoV-2 by FDA under an Emergency Use Authorization (EUA). This EUA will remain  in effect (meaning this test can be used) for the duration of the COVID-19 declaration under Section 564(b)(1) of the Act, 21 U.S.C.section 360bbb-3(b)(1), unless the authorization is terminated  or revoked sooner.       Influenza A by PCR NEGATIVE NEGATIVE Final   Influenza B by PCR NEGATIVE NEGATIVE Final    Comment: (NOTE) The Xpert Xpress SARS-CoV-2/FLU/RSV plus assay is intended as an aid in the diagnosis of influenza from Nasopharyngeal swab specimens and should not be used as a sole basis for treatment. Nasal washings and aspirates are unacceptable for Xpert Xpress  SARS-CoV-2/FLU/RSV testing.  Fact Sheet for Patients: bloggercourse.com  Fact Sheet for Healthcare Providers: seriousbroker.it  This test is not yet approved or cleared by the [place]  FDA and has been authorized for detection and/or diagnosis of SARS-CoV-2 by FDA under an Emergency Use Authorization (EUA). This EUA will remain in effect (meaning this test can be used) for the duration of the COVID-19 declaration under Section 564(b)(1) of the Act, 21 U.S.C. section 360bbb-3(b)(1), unless the authorization is terminated or revoked.     Resp Syncytial Virus by PCR NEGATIVE NEGATIVE Final    Comment: (NOTE) Fact Sheet for Patients: bloggercourse.com  Fact Sheet for Healthcare Providers: seriousbroker.it  This test is not yet approved or cleared by the [place]  FDA and has been authorized for detection and/or diagnosis of SARS-CoV-2 by FDA under an Emergency Use Authorization (EUA). This EUA will remain in effect (meaning this test can be used) for the duration of the COVID-19 declaration under Section 564(b)(1) of the Act, 21 U.S.C. section 360bbb-3(b)(1), unless the authorization is terminated or revoked.  Performed at [facility], [address], [place], [place] [address]   MRSA Next Gen by PCR, Nasal     Status: None   Collection Time: 10/04/24  1:56 PM   Specimen: Nasal Mucosa; Nasal Swab  Result Value Ref Range Status   MRSA by PCR Next Gen NOT DETECTED NOT DETECTED Final    Comment: (NOTE) The GeneXpert MRSA Assay (FDA approved for NASAL specimens only), is one component of a comprehensive MRSA colonization surveillance program. It is not intended to diagnose MRSA infection nor to guide or monitor treatment for MRSA infections. Test performance is not FDA approved in patients less than 59 years old. Performed at [facility], [address], [place],  [place] [address]      Labs: BNP (last 3 results) No results for input(s): BNP in the last 8760 hours. Basic Metabolic Panel: Recent Labs  Lab  10/04/24 1241 10/05/24 0431 10/06/24 0404 10/07/24 0433 10/08/24 0725  NA 139 141 142 140 139  K 4.4 3.6 3.6 3.6 4.2  CL 104 102 101 102 104  CO2 21* 30 32 27 27  GLUCOSE 121* 109* 99 100* 107*  BUN 22 23 28* 27* 23  CREATININE 0.99 1.49* 1.63* 1.49* 1.27*  CALCIUM  9.2 9.1 8.9 8.8* 9.1  MG  --  2.3 2.5*  --   --    Liver Function Tests: Recent Labs  Lab 10/04/24 1254  AST 21  ALT <5  ALKPHOS 71  BILITOT 0.9  PROT 6.9  ALBUMIN  4.4   No results for input(s): LIPASE, AMYLASE in the last 168 hours. No results for input(s): AMMONIA in the last 168 hours. CBC: Recent Labs  Lab 10/04/24 1241 10/05/24 0431  WBC 6.2 7.6  HGB 11.1* 11.1*  HCT 37.1* 35.9*  MCV 91.2 89.5  PLT 139* 128*   Cardiac Enzymes: No results for input(s): CKTOTAL, CKMB, CKMBINDEX, TROPONINI in the last 168 hours. BNP: Invalid input(s): POCBNP CBG: Recent Labs  Lab 10/07/24 0745 10/07/24 1102 10/07/24 1607 10/07/24 1920 10/08/24 0713  GLUCAP 190* 153* 156* 98 129*   D-Dimer No results for input(s): DDIMER in the last 72 hours. Hgb A1c No results for input(s): HGBA1C in the last 72 hours. Lipid Profile No results for input(s): CHOL, HDL, LDLCALC, TRIG, CHOLHDL, LDLDIRECT in the last 72 hours. Thyroid  function studies No results for input(s): TSH, T4TOTAL, T3FREE, THYROIDAB in the last 72 hours.  Invalid input(s): FREET3 Anemia work up No results for input(s): VITAMINB12, FOLATE, FERRITIN, TIBC, IRON, RETICCTPCT in the last 72 hours. Urinalysis    Component Value Date/Time   COLORURINE COLORLESS (A) 10/04/2024 1517   APPEARANCEUR CLEAR 10/04/2024 1517   LABSPEC 1.003 (L) 10/04/2024 1517   PHURINE 7.0 10/04/2024 1517   GLUCOSEU NEGATIVE 10/04/2024 1517   HGBUR MODERATE (A) 10/04/2024 1517    BILIRUBINUR NEGATIVE 10/04/2024 1517   KETONESUR NEGATIVE 10/04/2024 1517   PROTEINUR NEGATIVE 10/04/2024 1517   NITRITE NEGATIVE 10/04/2024 1517   LEUKOCYTESUR NEGATIVE 10/04/2024 1517   Sepsis Labs Recent Labs  Lab 10/04/24 1241 10/05/24 0431  WBC 6.2 7.6   Microbiology Recent Results (from the past 240 hours)  Resp panel by RT-PCR (RSV, Flu A&B, Covid) Anterior Nasal Swab     Status: None   Collection Time: 10/04/24 12:54 PM   Specimen: Anterior Nasal Swab  Result Value Ref Range Status   SARS Coronavirus 2 by RT PCR NEGATIVE NEGATIVE Final    Comment: (NOTE) SARS-CoV-2 target nucleic acids are NOT DETECTED.  The SARS-CoV-2 RNA is generally detectable in upper respiratory specimens during the acute phase of infection. The lowest concentration of SARS-CoV-2 viral copies this assay can detect is 138 copies/mL. A negative result does not preclude SARS-Cov-2 infection and should not be used as the sole basis for treatment or other patient management decisions. A negative result may occur with  improper specimen collection/handling, submission of specimen other than nasopharyngeal swab, presence of viral mutation(s) within the areas targeted by this assay, and inadequate number of viral copies(<138 copies/mL). A negative result must be combined with clinical observations, patient history, and epidemiological information. The expected result is Negative.  Fact Sheet for Patients:  bloggercourse.com  Fact Sheet for Healthcare Providers:  seriousbroker.it  This test is no t yet approved or cleared by the [place]  FDA and  has been authorized for detection and/or diagnosis of SARS-CoV-2 by FDA under an  Emergency Use Authorization (EUA). This EUA will remain  in effect (meaning this test can be used) for the duration of the COVID-19 declaration under Section 564(b)(1) of the Act, 21 U.S.C.section 360bbb-3(b)(1), unless  the authorization is terminated  or revoked sooner.       Influenza A by PCR NEGATIVE NEGATIVE Final   Influenza B by PCR NEGATIVE NEGATIVE Final    Comment: (NOTE) The Xpert Xpress SARS-CoV-2/FLU/RSV plus assay is intended as an aid in the diagnosis of influenza from Nasopharyngeal swab specimens and should not be used as a sole basis for treatment. Nasal washings and aspirates are unacceptable for Xpert Xpress SARS-CoV-2/FLU/RSV testing.  Fact Sheet for Patients: bloggercourse.com  Fact Sheet for Healthcare Providers: seriousbroker.it  This test is not yet approved or cleared by the [place]  FDA and has been authorized for detection and/or diagnosis of SARS-CoV-2 by FDA under an Emergency Use Authorization (EUA). This EUA will remain in effect (meaning this test can be used) for the duration of the COVID-19 declaration under Section 564(b)(1) of the Act, 21 U.S.C. section 360bbb-3(b)(1), unless the authorization is terminated or revoked.     Resp Syncytial Virus by PCR NEGATIVE NEGATIVE Final    Comment: (NOTE) Fact Sheet for Patients: bloggercourse.com  Fact Sheet for Healthcare Providers: seriousbroker.it  This test is not yet approved or cleared by the [place]  FDA and has been authorized for detection and/or diagnosis of SARS-CoV-2 by FDA under an Emergency Use Authorization (EUA). This EUA will remain in effect (meaning this test can be used) for the duration of the COVID-19 declaration under Section 564(b)(1) of the Act, 21 U.S.C. section 360bbb-3(b)(1), unless the authorization is terminated or revoked.  Performed at [facility], [address], [place], [place] [address]   MRSA Next Gen by PCR, Nasal     Status: None   Collection Time: 10/04/24  1:56 PM   Specimen: Nasal Mucosa; Nasal Swab  Result Value Ref Range Status   MRSA by PCR Next Gen NOT  DETECTED NOT DETECTED Final    Comment: (NOTE) The GeneXpert MRSA Assay (FDA approved for NASAL specimens only), is one component of a comprehensive MRSA colonization surveillance program. It is not intended to diagnose MRSA infection nor to guide or monitor treatment for MRSA infections. Test performance is not FDA approved in patients less than 62 years old. Performed at [facility], [address], [place], [place] [address]    Time coordinating discharge: 40 mins  SIGNED:  Afton Louder, MD  [facility] 10/08/2024, 9:09 AM How to contact the [facility] Attending or Consulting provider 7A - 7P or covering provider during after hours 7P -7A, for this patient?  Check the care team in [facility] and look for a) attending/consulting TRH provider listed and b) the TRH team listed Log into [email or web address] and use 's universal password to access. If you do not have the password, please contact the hospital operator. Locate the TRH provider you are looking for under [facility] and page to a number that you can be directly reached. If you still have difficulty reaching the provider, please page the [facility] (Director on Call) for the Hospitalists listed on amion for assistance.     [1]  Allergies Allergen Reactions   Lipitor [Atorvastatin]     Pain in neck and legs after taking medication

## 2024-10-08 NOTE — Progress Notes (Signed)
 Mobility Specialist Progress Note:    10/08/24 1235  Mobility  Activity Ambulated with assistance  Level of Assistance Minimal assist, patient does 75% or more  Assistive Device Front wheel walker  Distance Ambulated (ft) 40 ft  Range of Motion/Exercises Active;All extremities  Activity Response Tolerated well  Mobility Referral Yes  Mobility visit 1 Mobility  Mobility Specialist Start Time (ACUTE ONLY) 1235  Mobility Specialist Stop Time (ACUTE ONLY) 1255  Mobility Specialist Time Calculation (min) (ACUTE ONLY) 20 min   Pt received in chair, agreeable to mobility. Required MinA to stand and ambulate with RW. Tolerated well, some shuffling d/t Parkinson's. Returned to chair, family in room. All needs met.  Arthurine Oleary Mobility Specialist Please contact via Special Educational Needs Teacher or  Rehab office at [phone]

## 2024-10-08 NOTE — Progress Notes (Signed)
 Mobility Specialist Progress Note:    10/08/24 0945  Mobility  Activity Ambulated with assistance  Level of Assistance Minimal assist, patient does 75% or more  Assistive Device Front wheel walker  Distance Ambulated (ft) 40 ft  Range of Motion/Exercises Active;All extremities  Activity Response Tolerated well  Mobility Referral Yes  Mobility visit 1 Mobility  Mobility Specialist Start Time (ACUTE ONLY) 0945  Mobility Specialist Stop Time (ACUTE ONLY) 1005  Mobility Specialist Time Calculation (min) (ACUTE ONLY) 20 min   Pt received in chair, agreeable to mobility. Required MinA to stand and ambulate with RW. Tolerated well, some shuffling d/t Parkinson's. Returned to chair, alarm on. Family in room, all needs met.  Ruston Fedora Mobility Specialist Please contact via Special Educational Needs Teacher or  Rehab office at [phone]

## 2024-10-08 NOTE — Discharge Instructions (Signed)
 IMPORTANT INFORMATION: PAY CLOSE ATTENTION   PHYSICIAN DISCHARGE INSTRUCTIONS  Follow with Primary care provider  Grooms, Toni Amend, PA-C  and other consultants as instructed by your Hospitalist Physician  SEEK MEDICAL CARE OR RETURN TO EMERGENCY ROOM IF SYMPTOMS COME BACK, WORSEN OR NEW PROBLEM DEVELOPS   Please note: You were cared for by a hospitalist during your hospital stay. Every effort will be made to forward records to your primary care provider.  You can request that your primary care provider send for your hospital records if they have not received them.  Once you are discharged, your primary care physician will handle any further medical issues. Please note that NO REFILLS for any discharge medications will be authorized once you are discharged, as it is imperative that you return to your primary care physician (or establish a relationship with a primary care physician if you do not have one) for your post hospital discharge needs so that they can reassess your need for medications and monitor your lab values.  Please get a complete blood count and chemistry panel checked by your Primary MD at your next visit, and again as instructed by your Primary MD.  Get Medicines reviewed and adjusted: Please take all your medications with you for your next visit with your Primary MD  Laboratory/radiological data: Please request your Primary MD to go over all hospital tests and procedure/radiological results at the follow up, please ask your primary care provider to get all Hospital records sent to his/her office.  In some cases, they will be blood work, cultures and biopsy results pending at the time of your discharge. Please request that your primary care provider follow up on these results.  If you are diabetic, please bring your blood sugar readings with you to your follow up appointment with primary care.    Please call and make your follow up appointments as soon as possible.    Also  Note the following: If you experience worsening of your admission symptoms, develop shortness of breath, life threatening emergency, suicidal or homicidal thoughts you must seek medical attention immediately by calling 911 or calling your MD immediately  if symptoms less severe.  You must read complete instructions/literature along with all the possible adverse reactions/side effects for all the Medicines you take and that have been prescribed to you. Take any new Medicines after you have completely understood and accpet all the possible adverse reactions/side effects.   Do not drive when taking Pain medications or sleeping medications (Benzodiazepines)  Do not take more than prescribed Pain, Sleep and Anxiety Medications. It is not advisable to combine anxiety,sleep and pain medications without talking with your primary care practitioner  Special Instructions: If you have smoked or chewed Tobacco  in the last 2 yrs please stop smoking, stop any regular Alcohol  and or any Recreational drug use.  Wear Seat belts while driving.  Do not drive if taking any narcotic, mind altering or controlled substances or recreational drugs or alcohol.

## 2024-10-08 NOTE — TOC Transition Note (Signed)
 Transition of Care [facility]) - Discharge Note   Patient Details  Name: Stephen Graves MRN: [id number] Date of Birth: [birth year]  [facility] CM/SW Contact:  Sharlyne Stabs, RN Phone Number: 10/08/2024, 9:39 AM   Clinical Narrative:   HTA called with Auth # [id number], [facility] confirmed his bed will be ready at [facility] RN calling report. Camie, sister will transport. RN updated to discharge patient around 11:45 and Kristin is aware that Camie will be transporting. DC summary sent in the hub.     Final next level of care: Skilled Nursing Facility Barriers to Discharge: Barriers Resolved   Patient Goals and CMS Choice Patient states their goals for this hospitalization and ongoing recovery are:: Agreeable to SNF CMS Medicare.gov Compare Post Acute Care list provided to:: Patient Choice offered to / list presented to : Patient [place] ownership interest in [facility].provided to:: Patient    Discharge Placement                Patient to be transferred to facility by: Darin Camie Name of family member notified: Camie Patient and family notified of of transfer: 10/08/24  Discharge Plan and Services Additional resources added to the After Visit Summary for           Social Drivers of Health (SDOH) Interventions SDOH Screenings   Food Insecurity: No Food Insecurity (10/04/2024)  Housing: Low Risk (10/04/2024)  Transportation Needs: No Transportation Needs (10/04/2024)  Utilities: Not At Risk (10/04/2024)  Social Connections: Moderately Integrated (10/04/2024)  Tobacco Use: Medium Risk (10/04/2024)     Readmission Risk Interventions    10/08/2024    9:39 AM  Readmission Risk Prevention Plan  Post Dischage Appt Complete  Medication Screening Complete  Transportation Screening Complete

## 2024-10-08 NOTE — Care Management Important Message (Signed)
 Important Message  Patient Details  Name: Stephen Graves MRN: [id number] Date of Birth: 1956/06/18   Important Message Given:  Yes - Medicare IM     Burle Kwan L Kellan Boehlke 10/08/2024, 10:32 AM

## 2024-10-08 NOTE — Progress Notes (Signed)
 Heart Failure Nurse Navigator Progress Note  PCP: Grooms, [place], PA-C PCP-Cardiologist: Dorn Ross, MD Admission Diagnosis: Acute congestive heart failure, unspecified heart failure type (HCC) Permanent atrial fibrillation (HCC) Hypertension, unspecified type Admitted from: Home  [facility] Patient- Patient contacted via telephone to his room at [facility].  His sister Stephen Graves was present on the phone also for CHF education and scheduling of TOC appointment at [facility].  2 Patient identifiers confirmed prior to conversation.  Presentation:   Stephen Graves is a 69 y.o. male.  He presented with shortness of breath for the past few days. Elevated BOP on arrival to ED. He had been complainant with medications. Past medical history of Atrial Fibrillation (on Eliquis ) but denied history of Heart Failure,  Parkinson's disease, HTN, HLD  and CAD. Former smoker for more than 20 years before quitting 35 years ago.HS-Troponin 25.  Pro-BNP 5,919. Chest x-ray: Mild congestive heart failure.  ECHO/ LVEF: 60-65% Severe concentric left ventricular hypertrophy.  Left and Right atrial size was severely dilated.  Moderate pericardial effusion that was posterior and lateral to the left ventricle.  Mild mitral valve regurgitation. Mild calcification of aortic valve.  Mild dilation of the aortic root.   Mild dilatation of the aortic root.  Clinical Course:  Past Medical History:  Diagnosis Date   Anxiety    Coronary artery disease involving native coronary 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 (HCC) 07/18/2017   a. s/p DES to mid-distal LAD in 06/2017 with small septal branch jailed by stent placement b. 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   Skull fracture (HCC)      Social History   Socioeconomic History   Marital status: Divorced    Spouse name: Not on file    Number of children: 0   Years of education: Not on file   Highest education level: Not on file  Occupational History   Not on file  Tobacco Use   Smoking status: Former    Current packs/day: 0.00    Types: Cigarettes    Quit date: 09/24/1988    Years since quitting: 36.0   Smokeless tobacco: Never   Tobacco comments:    tobacco use - no  Vaping Use   Vaping status: Never Used  Substance and Sexual Activity   Alcohol  use: No   Drug use: No   Sexual activity: Not Currently    Birth control/protection: None  Other Topics Concern   Not on file  Social History Narrative   Full time; single.    Social Drivers of Health   Tobacco Use: Medium Risk (10/04/2024)   Patient History    Smoking Tobacco Use: Former    Smokeless Tobacco Use: Never    Passive Exposure: Not on Actuary Strain: Not on file  Food Insecurity: No Food Insecurity (10/04/2024)   Epic    Worried About Programme Researcher, Broadcasting/film/video in the Last Year: Never true    Ran Out of Food in the Last Year: Never true  Transportation Needs: No Transportation Needs (10/04/2024)   Epic    Lack of Transportation (Medical): No    Lack of Transportation (Non-Medical): No  Physical Activity: Not on file  Stress: Not on file  Social Connections: Moderately Integrated (10/04/2024)   Social Connection and Isolation Panel  Frequency of Communication with Friends and Family: Twice a week    Frequency of Social Gatherings with Friends and Family: More than three times a week    Attends Religious Services: More than 4 times per year    Active Member of [employer] or Organizations: Yes    Attends Engineer, Structural: More than 4 times per year    Marital Status: Divorced  Depression (PHQ2-9): Not on file  Alcohol  Screen: Not on file  Housing: Low Risk (10/04/2024)   Epic    Unable to Pay for Housing in the Last Year: No    Number of Times Moved in the Last Year: 0    Homeless in the Last Year: No  Utilities: Not At  Risk (10/04/2024)   Epic    Threatened with loss of utilities: No  Health Literacy: Not on file   Education Assessment and Provision:  Detailed education and instructions provided on heart failure disease management including the following:  Signs and symptoms of Heart Failure When to call the physician Importance of daily weights Low sodium diet Fluid restriction Medication management Anticipated future follow-up appointments  Patient education given on each of the above topics.  Patient acknowledges understanding via teach back method and acceptance of all instructions.  Education Materials:  Living Better With Heart Failure Booklet, HF zone tool, & Daily Weight Tracker Tool.  Patient has scale at home: Yes Patient has pill box at home: Patient has vision loss so he has a system of medication delivery that seems to be working for him at this time.    High Risk Criteria for Readmission and/or Poor Patient Outcomes: Heart failure hospital admissions (last 6 months): 1  No Show rate: 0% Difficult social situation: Vision loss both eyes. Ambulation-due to Parkinson's Disease. 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 [facility]- Advanced Heart Failure Clinic. 10/19/24 @ 11:15  Items for Follow-up on DC/TOC: Daily Weights Diet & Fluid Restrictions New CHF diagnosis and education  Charmaine Pines, RN, BSN [facility] Heart Failure Navigator Secure Chat Only

## 2024-10-19 ENCOUNTER — Ambulatory Visit (HOSPITAL_COMMUNITY)

## 2024-10-22 NOTE — Progress Notes (Signed)
 "   HEART & VASCULAR TRANSITION OF CARE CONSULT NOTE    PCP: Grooms, [place], PA-C  Cardiologist: Alvan Carrier, MD   Chief Complaint: Chronic HFpEF  HPI: Referred to clinic by Dr. Vicci for heart failure consultation.   Stephen Graves is a 69 y.o. male with CAD s/p stent, HTN, hx orthostatic hypotension, HLD, parkinsons, orthostatic hypotension, former smoker and hx of a fib on AC.   Admitted 1/26 with A/C HFpEF. SBP >200 on admission. Diuresed well with IV lasix . Echo showed EF 60-65%, LV no RWMA, severe concentric LVH, RV normal, LA/RA severely dilated, mod pericardial effusion, mild MR. BP improved with IV hydralazine  and labetalol . Transitioned to POs at discharge. Discharged to SNF.   Left [facility] 10/27/24.   Today he returns for [facility] clinic visit with his sister. Overall feeling ok just concerned about some BLE weakness. Denies palpitations, CP, dizziness, edema, or PND/Orthopnea. No SOB. Appetite ok, does not watch what he eats. Goes out to eat almost daily with friends or family. No fever or chills. Weight at SNF 213-221 pounds. Taking all medications. Denies ETOH, tobacco or drug use. SBP 100-130s, reports reviewed from facility, has not been checking since home.   Cardiac Testing  - Echo 1/25: LVEF 60-65%, severe concentric LVH, mod pericardial effusion - Echo 12/23: EF 60-56%, severe LVH - LHC 9/22: 3vCAD, s/p CTO PTCA/DES PCI of ptrox-distal LAD - LHC 10/18: 1vCAD, DES placed to mid-distal LAD  Past Medical History:  Diagnosis Date   Anxiety    Coronary artery disease involving native coronary 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 (HCC) 07/18/2017   a. s/p DES to mid-distal LAD in 06/2017 with small septal branch jailed by stent placement b. 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     apixaban  (ELIQUIS ) 5 MG TABS tablet Take 1 tablet (5 mg total) by mouth 2 (two) times daily. 180 tablet 1   carbidopa -levodopa  (SINEMET  IR) 25-100 MG tablet Take 2 tablets by mouth 3 (three) times daily. 540 tablet 4   carvedilol  (COREG ) 6.25 MG tablet Take 1 tablet (6.25 mg total) by mouth 2 (two) times daily. 180 tablet 3   Cholecalciferol (VITAMIN D3) 25 MCG (1000 UT) CAPS Take 1 capsule by mouth daily.     citalopram  (CELEXA ) 40 MG tablet Take 1 tablet (40 mg total) by mouth daily. 90 tablet 1   FERROUS SULFATE PO Take 325 mg by mouth daily.     fish oil-omega-3 fatty acids  1000 MG capsule Take 1 g by mouth daily.     losartan  (COZAAR ) 25 MG tablet Take 0.5 tablets (12.5 mg total) by mouth daily. If systolic BP is greater than 140 45 tablet 3   metFORMIN  (GLUCOPHAGE -XR) 500 MG 24 hr tablet Take 1 tablet (500 mg total) by mouth daily.     nitroGLYCERIN  (NITROSTAT ) 0.4 MG SL tablet Place 1 tablet (0.4 mg total) under the tongue every 5 (five) minutes as needed for chest pain. 25 tablet 2   polyethylene glycol (MIRALAX  / GLYCOLAX ) 17 g packet Take 17 g by mouth daily.     potassium chloride  (KLOR-CON  M) 10  MEQ tablet Take 1 tablet (10 mEq total) by mouth daily. Take with furosemide  (lasix )     rosuvastatin  (CRESTOR ) 20 MG tablet Take 1 tablet (20 mg total) by mouth daily. 90 tablet 3   Sennosides (SENNA) 8.6 MG CAPS Take 1 tablet by mouth daily.     furosemide  (LASIX ) 20 MG tablet Take 1 tablet (20 mg total) by mouth daily. MAY TAKE AN EXTRA 20MG  ONCE DAILY AS NEEDED FOR SWELLING OR WEIGHT GAIN 30 tablet 3   No current facility-administered medications for this encounter.    Allergies[1]    Social History   Socioeconomic History   Marital status: Divorced    Spouse name: Not on file   Number of children: 0   Years of education: Not on file   Highest education  level: Not on file  Occupational History   Not on file  Tobacco Use   Smoking status: Former    Current packs/day: 0.00    Types: Cigarettes    Quit date: 09/24/1988    Years since quitting: 36.1   Smokeless tobacco: Never   Tobacco comments:    tobacco use - no  Vaping Use   Vaping status: Never Used  Substance and Sexual Activity   Alcohol  use: No   Drug use: No   Sexual activity: Not Currently    Birth control/protection: None  Other Topics Concern   Not on file  Social History Narrative   Full time; single.    Social Drivers of Health   Tobacco Use: Medium Risk (10/04/2024)   Patient History    Smoking Tobacco Use: Former    Smokeless Tobacco Use: Never    Passive Exposure: Not on Actuary Strain: Not on file  Food Insecurity: No Food Insecurity (10/04/2024)   Epic    Worried About Programme Researcher, Broadcasting/film/video in the Last Year: Never true    Ran Out of Food in the Last Year: Never true  Transportation Needs: No Transportation Needs (10/04/2024)   Epic    Lack of Transportation (Medical): No    Lack of Transportation (Non-Medical): No  Physical Activity: Not on file  Stress: Not on file  Social Connections: Moderately Integrated (10/04/2024)   Social Connection and Isolation Panel    Frequency of Communication with Friends and Family: Twice a week    Frequency of Social Gatherings with Friends and Family: More than three times a week    Attends Religious Services: More than 4 times per year    Active Member of [employer] or Organizations: Yes    Attends Banker Meetings: More than 4 times per year    Marital Status: Divorced  Intimate Partner Violence: Not At Risk (10/04/2024)   Epic    Fear of Current or Ex-Partner: No    Emotionally Abused: No    Physically Abused: No    Sexually Abused: No  Depression (PHQ2-9): Not on file  Alcohol  Screen: Not on file  Housing: Low Risk (10/04/2024)   Epic    Unable to Pay for Housing in the Last Year: No     Number of Times Moved in the Last Year: 0    Homeless in the Last Year: No  Utilities: Not At Risk (10/04/2024)   Epic    Threatened with loss of utilities: No  Health Literacy: Not on file    Family History  Problem Relation Age of Onset   Stroke Father 47   Heart attack Father 54  CAD Mother    Prostate cancer Brother    Thyroid  nodules Brother     Vitals:   10/30/24 1044  BP: 90/64  Pulse: 77  SpO2: 99%  Weight: 97.3 kg (214 lb 9.6 oz)   Physical exam:  General:  elderly appearing.  No respiratory difficulty. Arrived in [facility] Neck: JVD flat.  Cor: Regular rate & rhythm. No murmurs. Lungs: clear Extremities: no edema  Neuro: alert & oriented x 3. Affect pleasant.   ECG: none today  ASSESSMENT & PLAN: Chronic HFpEF - Echo 1/25: LVEF 60-65%, severe concentric LVH, mod pericardial effusion - NYHA IIIa- confounded by parkinson's and weakness.  - Euvolemic on exam. Continue lasix  20 mg daily + 10 mEq KDUR. Will change script to where he can take additional 20 mg daily PRN as needed for weight gain >3lbs overnight or >5lbs in 1 week.  - Continue coreg  6.25 mg BID - Continue Losartan  12.5 mg daily. Discussed with him and sister that its ok to change to at bedtime if he experiences dizziness during the day.  - GDMT limited with hx orthostasis/hypovolemia  HTN - BP soft today but most recent readings stable - Continue coreg , amlodipine  and losartan   CAD - LHC 9/22: 3vCAD, s/p CTO PTCA/DES PCI of ptrox-distal LAD - LHC 10/18: 1vCAD, DES placed to mid-distal LAD - Off ASA with Eliquis . Denies abnormal bleeding.  - Continue statin - Denies CP  A fib - Continue Eliquis  and BB.  - rate controlled   Referred to HFSW (PCP, Medications, Transportation, ETOH Abuse, Drug Abuse, Insurance, Surveyor, Quantity ):  No Refer to Pharmacy: No Refer to Home Health: No Refer to Advanced Heart Failure Clinic: No  Refer to General Cardiology: Patient of Alvan Carrier, MD   Follow up as  scheduled with [facility]     [1]  Allergies Allergen Reactions   Lipitor [Atorvastatin]     Pain in neck and legs after taking medication   "

## 2024-10-27 ENCOUNTER — Ambulatory Visit: Admitting: Student

## 2024-10-28 ENCOUNTER — Telehealth: Payer: Self-pay

## 2024-10-28 NOTE — Transitions of Care (Post Inpatient/ED Visit) (Signed)
 "  10/28/2024  Name: Stephen Graves MRN: [id number] DOB: 05-14-1956  Today's TOC FU Call Status: Today's TOC FU Call Status:: Successful TOC FU Call Completed TOC FU Call Complete Date: 10/28/24  Patient's Name and Date of Birth confirmed. Name, DOB  Transition Care Management Follow-up Telephone Call Date of Discharge: 10/27/24 Discharge Facility: Other ([facility]) Name of Other ([facility]) Discharge Facility: [facility] Type of Discharge: Inpatient Admission Primary Inpatient Discharge Diagnosis:: dyspnea How have you been since you were released from the hospital?: Better Any questions or concerns?: No  Items Reviewed: Did you receive and understand the discharge instructions provided?: Yes Medications obtained,verified, and reconciled?: Yes (Medications Reviewed) Any new allergies since your discharge?: No Dietary orders reviewed?: Yes Do you have support at home?: No  Medications Reviewed Today: Medications Reviewed Today     Reviewed by Emmitt Pan, LPN (Licensed Practical Nurse) on 10/28/24 at 1003  Med List Status: <None>   Medication Order Taking? Sig Documenting Provider Last Dose Status Informant  amLODipine  (NORVASC ) 5 MG tablet [id number] Yes Take 1 tablet (5 mg total) by mouth daily. Johnson, Clanford L, MD  Active   apixaban  (ELIQUIS ) 5 MG TABS tablet [id number] Yes Take 1 tablet (5 mg total) by mouth 2 (two) times daily. Alvan Dorn FALCON, MD  Active Self, Pharmacy Records  carbidopa -levodopa  (SINEMET  IR) 25-100 MG tablet [id number] Yes Take 2 tablets by mouth 3 (three) times daily. Penumalli, Vikram R, MD  Active Self, Pharmacy Records  carvedilol  (COREG ) 6.25 MG tablet [id number] Yes Take 1 tablet (6.25 mg total) by mouth 2 (two) times daily. Alvan Dorn FALCON, MD  Active Self, Pharmacy Records  Cholecalciferol (VITAMIN D3) 25 MCG (1000 UT) CAPS [id number] Yes Take 1 capsule by mouth daily. [provider]  Active Self, Pharmacy Records   citalopram  (CELEXA ) 40 MG tablet [id number] Yes Take 1 tablet (40 mg total) by mouth daily. Grooms, [place], PA-C  Active Self, Pharmacy Records  fish oil-omega-3 fatty acids  1000 MG capsule [id number] Yes Take 1 g by mouth daily. [provider]  Active Self, Pharmacy Records  furosemide  (LASIX ) 20 MG tablet [id number] Yes Take 1 tablet (20 mg total) by mouth daily. Johnson, Clanford L, MD  Active   losartan  (COZAAR ) 25 MG tablet [id number] Yes Take 0.5 tablets (12.5 mg total) by mouth daily. If systolic BP is greater than 140 Branch, Dorn FALCON, MD  Active Self, Pharmacy Records  metFORMIN  (GLUCOPHAGE -XR) 500 MG 24 hr tablet [id number] Yes Take 1 tablet (500 mg total) by mouth daily. Johnson, Clanford L, MD  Active   nitroGLYCERIN  (NITROSTAT ) 0.4 MG SL tablet [id number] Yes Place 1 tablet (0.4 mg total) under the tongue every 5 (five) minutes as needed for chest pain. Alvan Dorn FALCON, MD  Active Self, Pharmacy Records  polyethylene glycol (MIRALAX  / GLYCOLAX ) 17 g packet [id number] Yes Take 17 g by mouth daily. Vicci Afton CROME, MD  Active   potassium chloride  (KLOR-CON  M) 10 MEQ tablet [id number] Yes Take 1 tablet (10 mEq total) by mouth daily. Take with furosemide  (lasix ) Vicci, Clanford L, MD  Active   rosuvastatin  (CRESTOR ) 20 MG tablet [id number] Yes Take 1 tablet (20 mg total) by mouth daily. Alvan Dorn FALCON, MD  Active Self, Pharmacy Records            Home Care and Equipment/Supplies: Were Home Health Services Ordered?: Yes Name of Home Health Agency:: [facility] Has Agency set up a time to come to your home?: Yes First Home Health Visit Date:  10/29/24 Any new equipment or medical supplies ordered?: NA  Functional Questionnaire: Do you need assistance with bathing/showering or dressing?: No Do you need assistance with meal preparation?: No Do you need assistance with eating?: No Do you have difficulty maintaining continence: No Do you need assistance with getting out of  bed/getting out of a chair/moving?: No Do you have difficulty managing or taking your medications?: No  Follow up appointments reviewed: PCP Follow-up appointment confirmed?: Yes Date of PCP follow-up appointment?: 11/02/24 Follow-up Provider: [facility] Follow-up appointment confirmed?: Yes Date of Specialist follow-up appointment?: 10/30/24 Follow-Up Specialty Provider:: Cardio Do you need transportation to your follow-up appointment?: No Do you understand care options if your condition(s) worsen?: Yes-patient verbalized understanding    SIGNATURE Julian Lemmings, LPN [facility] Nurse Health Advisor Direct Dial [phone]  "

## 2024-10-29 ENCOUNTER — Telehealth (HOSPITAL_COMMUNITY): Payer: Self-pay

## 2024-10-29 NOTE — Telephone Encounter (Signed)
 Called to confirm/remind patient of their appointment at the [facility] on 10/29/24.   Appointment:   [x] Confirmed  [] Left mess   [] No answer/No voice mail  [] VM Full/unable to leave message  [] Phone not in service  Patient reminded to bring all medications and/or complete list.  Confirmed patient has transportation. Gave directions, instructed to utilize valet parking.

## 2024-10-30 ENCOUNTER — Encounter (HOSPITAL_COMMUNITY): Payer: Self-pay

## 2024-10-30 ENCOUNTER — Ambulatory Visit (HOSPITAL_COMMUNITY): Admission: RE | Admit: 2024-10-30 | Source: Ambulatory Visit

## 2024-10-30 VITALS — BP 90/64 | HR 77 | Wt 214.6 lb

## 2024-10-30 DIAGNOSIS — I10 Essential (primary) hypertension: Secondary | ICD-10-CM

## 2024-10-30 DIAGNOSIS — I2510 Atherosclerotic heart disease of native coronary artery without angina pectoris: Secondary | ICD-10-CM

## 2024-10-30 DIAGNOSIS — I4819 Other persistent atrial fibrillation: Secondary | ICD-10-CM

## 2024-10-30 DIAGNOSIS — I5032 Chronic diastolic (congestive) heart failure: Secondary | ICD-10-CM

## 2024-10-30 MED ORDER — FUROSEMIDE 20 MG PO TABS: 20.0000 mg | ORAL_TABLET | Freq: Every day | ORAL | 3 refills | Status: AC

## 2024-10-30 NOTE — Patient Instructions (Signed)
 Medication Changes:  START LASIX  (FUROSEMIDE )  20MG  ONCE DAILY, MAY TAKE AN EXTRA 20MG  DAILY AS NEEDED FOR SWELLING OR WEIGHT GAIN OF 3 POUNDS OVERNIGHT OR 5 POUNDS IN 1 WEEK   Follow-Up in: WITH DR. BRANCH AS SCHEDULED   At the [facility], you and your health needs are our priority. We have a designated team specialized in the treatment of Heart Failure. This Care Team includes your primary Heart Failure Specialized Cardiologist (physician), Advanced Practice Providers (APPs- Physician Assistants and Nurse Practitioners), and Pharmacist who all work together to provide you with the care you need, when you need it.   You may see any of the following providers on your designated Care Team at your next follow up:  Dr. Toribio Fuel Dr. Ezra Shuck Dr. Odis Brownie Greig Mosses, NP Caffie Shed, [place] [facility], [place] Beckey Coe, NP Jordan Lee, NP Tinnie Redman, PharmD   Please be sure to bring in all your medications bottles to every appointment.   Need to Contact Us :  If you have any questions or concerns before your next appointment please send us  a message through [facility] or call our office at [phone].    TO LEAVE A MESSAGE FOR THE NURSE SELECT OPTION 2, PLEASE LEAVE A MESSAGE INCLUDING: YOUR NAME DATE OF BIRTH CALL BACK NUMBER REASON FOR CALL**this is important as we prioritize the call backs  YOU WILL RECEIVE A CALL BACK THE SAME DAY AS LONG AS YOU CALL BEFORE 4:00 PM

## 2024-11-02 ENCOUNTER — Ambulatory Visit: Admitting: Physician Assistant

## 2024-11-10 ENCOUNTER — Ambulatory Visit: Admitting: Physician Assistant

## 2024-11-27 ENCOUNTER — Ambulatory Visit: Admitting: Cardiology

## 2025-01-25 ENCOUNTER — Ambulatory Visit: Admitting: Diagnostic Neuroimaging

## 2025-02-01 ENCOUNTER — Ambulatory Visit: Admitting: Physician Assistant
